# Patient Record
Sex: Female | Born: 1965 | Race: White | Hispanic: Yes | Marital: Single | Smoking: Former smoker
Health system: Southern US, Community
[De-identification: ages and names within clinical notes are randomized; demographics above are authoritative.]

## PROBLEM LIST (undated history)

## (undated) DIAGNOSIS — I7121 Aneurysm of the ascending aorta, without rupture: Secondary | ICD-10-CM

## (undated) DIAGNOSIS — M1990 Unspecified osteoarthritis, unspecified site: Secondary | ICD-10-CM

## (undated) DIAGNOSIS — R0609 Other forms of dyspnea: Secondary | ICD-10-CM

## (undated) DIAGNOSIS — G909 Disorder of the autonomic nervous system, unspecified: Secondary | ICD-10-CM

## (undated) DIAGNOSIS — R0600 Dyspnea, unspecified: Secondary | ICD-10-CM

## (undated) DIAGNOSIS — K319 Disease of stomach and duodenum, unspecified: Secondary | ICD-10-CM

## (undated) DIAGNOSIS — R7303 Prediabetes: Secondary | ICD-10-CM

## (undated) DIAGNOSIS — I712 Thoracic aortic aneurysm, without rupture: Secondary | ICD-10-CM

## (undated) DIAGNOSIS — E042 Nontoxic multinodular goiter: Secondary | ICD-10-CM

## (undated) DIAGNOSIS — D179 Benign lipomatous neoplasm, unspecified: Secondary | ICD-10-CM

## (undated) DIAGNOSIS — Z8679 Personal history of other diseases of the circulatory system: Secondary | ICD-10-CM

## (undated) DIAGNOSIS — J45909 Unspecified asthma, uncomplicated: Secondary | ICD-10-CM

## (undated) DIAGNOSIS — F429 Obsessive-compulsive disorder, unspecified: Secondary | ICD-10-CM

## (undated) DIAGNOSIS — J45990 Exercise induced bronchospasm: Secondary | ICD-10-CM

## (undated) DIAGNOSIS — E079 Disorder of thyroid, unspecified: Secondary | ICD-10-CM

## (undated) DIAGNOSIS — K2100 Gastro-esophageal reflux disease with esophagitis, without bleeding: Secondary | ICD-10-CM

## (undated) DIAGNOSIS — N840 Polyp of corpus uteri: Secondary | ICD-10-CM

## (undated) DIAGNOSIS — I714 Abdominal aortic aneurysm, without rupture: Secondary | ICD-10-CM

## (undated) DIAGNOSIS — I10 Essential (primary) hypertension: Secondary | ICD-10-CM

## (undated) DIAGNOSIS — E119 Type 2 diabetes mellitus without complications: Secondary | ICD-10-CM

## (undated) HISTORY — DX: Unspecified osteoarthritis, unspecified site: M19.90

## (undated) HISTORY — DX: Disease of stomach and duodenum, unspecified: K31.9

## (undated) HISTORY — DX: Abdominal aortic aneurysm, without rupture: I71.4

## (undated) HISTORY — DX: Thoracic aortic aneurysm, without rupture: I71.2

## (undated) HISTORY — DX: Obsessive-compulsive disorder, unspecified: F42.9

## (undated) HISTORY — DX: Aneurysm of the ascending aorta, without rupture: I71.21

## (undated) HISTORY — DX: Essential (primary) hypertension: I10

## (undated) HISTORY — DX: Benign lipomatous neoplasm, unspecified: D17.9

## (undated) HISTORY — DX: Disorder of thyroid, unspecified: E07.9

## (undated) HISTORY — DX: Unspecified asthma, uncomplicated: J45.909

## (undated) HISTORY — DX: Nontoxic multinodular goiter: E04.2

## (undated) HISTORY — DX: Type 2 diabetes mellitus without complications: E11.9

## (undated) HISTORY — DX: Gastro-esophageal reflux disease with esophagitis, without bleeding: K21.00

## (undated) HISTORY — DX: Dyspnea, unspecified: R06.00

## (undated) HISTORY — DX: Other forms of dyspnea: R06.09

---

## 1995-04-18 HISTORY — PX: COSMETIC SURGERY: SHX468

## 1996-04-17 HISTORY — PX: BREAST REDUCTION SURGERY: SHX8

## 1996-12-01 HISTORY — PX: REDUCTION MAMMAPLASTY: SUR839

## 2005-04-17 HISTORY — PX: OTHER SURGICAL HISTORY: SHX169

## 2013-04-17 DIAGNOSIS — T7840XA Allergy, unspecified, initial encounter: Secondary | ICD-10-CM

## 2013-04-17 DIAGNOSIS — J302 Other seasonal allergic rhinitis: Secondary | ICD-10-CM

## 2013-04-17 HISTORY — DX: Other seasonal allergic rhinitis: J30.2

## 2013-04-17 HISTORY — DX: Allergy, unspecified, initial encounter: T78.40XA

## 2014-03-22 ENCOUNTER — Ambulatory Visit: Payer: Self-pay | Admitting: Internal Medicine

## 2014-03-22 LAB — RAPID STREP-A WITH REFLX: Micro Text Report: NEGATIVE

## 2014-03-25 LAB — BETA STREP CULTURE(ARMC)

## 2015-08-18 ENCOUNTER — Encounter: Payer: Self-pay | Admitting: Physician Assistant

## 2015-08-18 ENCOUNTER — Ambulatory Visit: Payer: Self-pay | Admitting: Physician Assistant

## 2015-08-18 VITALS — BP 110/80 | HR 72 | Temp 98.7°F

## 2015-08-18 DIAGNOSIS — J018 Other acute sinusitis: Secondary | ICD-10-CM

## 2015-08-18 MED ORDER — CLARITHROMYCIN ER 500 MG PO TB24: 1000.0000 mg | ORAL_TABLET | Freq: Every day | ORAL | Status: DC

## 2015-08-18 NOTE — Progress Notes (Signed)
S: C/o sore throat and sinus drainage for 3 days, no fever, chills, cp/sob, v/d; mucus is green and thick, cough is sporadic, c/o of facial and dental pain.   Using otc meds: cold and sinus med  O: PE: vitals wnl, nad, perrl eomi, normocephalic, tms dull, nasal mucosa red and swollen, throat injected, neck supple no lymph, lungs c t a, cv rrr, neuro intact  A:  Acute sinusitis   P: biaxin xl 500mg  2 po qd x 10d; drink fluids, continue regular meds , use otc meds of choice, return if not improving in 5 days, return earlier if worsening

## 2015-10-14 ENCOUNTER — Ambulatory Visit (INDEPENDENT_AMBULATORY_CARE_PROVIDER_SITE_OTHER): Payer: 59 | Admitting: Family Medicine

## 2015-10-14 ENCOUNTER — Encounter: Payer: Self-pay | Admitting: Family Medicine

## 2015-10-14 VITALS — BP 120/88 | HR 84 | Ht 63.0 in | Wt 189.0 lb

## 2015-10-14 DIAGNOSIS — E119 Type 2 diabetes mellitus without complications: Secondary | ICD-10-CM | POA: Diagnosis not present

## 2015-10-14 DIAGNOSIS — E079 Disorder of thyroid, unspecified: Secondary | ICD-10-CM | POA: Diagnosis not present

## 2015-10-14 DIAGNOSIS — Z7689 Persons encountering health services in other specified circumstances: Secondary | ICD-10-CM

## 2015-10-14 DIAGNOSIS — Z7189 Other specified counseling: Secondary | ICD-10-CM | POA: Diagnosis not present

## 2015-10-14 NOTE — Progress Notes (Signed)
Name: Brittany Ochoa   MRN: [id number]    DOB: 1965/05/29   Date:10/14/2015       Progress Note  Subjective  Chief Complaint  Chief Complaint  Patient presents with  . Establish Care  . Diabetes    has been "pre-diabetic in past, not ever been on meds"    HPI Comments: Patient to establish care new physician.  Diabetes She presents for her follow-up diabetic visit. Diabetes type: prediabetic. Onset time: 1. Her disease course has been fluctuating. There are no hypoglycemic associated symptoms. Pertinent negatives for hypoglycemia include no dizziness, headaches or nervousness/anxiousness. Associated symptoms include blurred vision, fatigue, foot paresthesias, polydipsia, polyphagia, polyuria and visual change. Pertinent negatives for diabetes include no chest pain, no foot ulcerations, no weakness and no weight loss. ([place]uria) There are no hypoglycemic complications. Symptoms are worsening. There are no diabetic complications. Pertinent negatives for diabetic complications include no autonomic neuropathy, CVA, heart disease, impotence, nephropathy, peripheral neuropathy, PVD or retinopathy. Risk factors for coronary artery disease include obesity. Current diabetic treatment includes diet. She is compliant with treatment all of the time. Her breakfast blood glucose is taken between 8-9 am. Her breakfast blood glucose range is generally 110-130 mg/dl. She does not see a podiatrist.Eye exam is not current.  Thyroid Problem Presents for initial visit. Symptoms include cold intolerance, constipation, depressed mood, dry skin, fatigue, hair loss, hoarse voice, palpitations, visual change and weight gain. Patient reports no anxiety, diarrhea or weight loss. The symptoms have been stable. Treatments tried: cobalt exposure. (Needle bx/aspiration)    No problem-specific assessment & plan notes found for this encounter.   Past Medical History  Diagnosis Date  . Thyroid disease   . Diabetes  mellitus without complication [facility])     Past Surgical History  Procedure Laterality Date  . Breast reduction surgery  1998  . Lipoma removal  2007    back    Family History  Problem Relation Age of Onset  . Diabetes Mother   . Diabetes Father   . Heart disease Father   . Cancer Paternal Grandmother   . Heart disease Paternal Grandmother   . Cancer Paternal Grandfather   . Heart disease Paternal Grandfather     Social History   Social History  . Marital Status: Single    Spouse Name: N/A  . Number of Children: N/A  . Years of Education: N/A   Occupational History  . Not on file.   Social History Main Topics  . Smoking status: Former Smoker    Quit date: 04/17/2004  . Smokeless tobacco: Not on file  . Alcohol Use: 0.0 oz/week    0 Standard drinks or equivalent per week     Comment: social  . Drug Use: No     Comment: past only  . Sexual Activity: Not Currently   Other Topics Concern  . Not on file   Social History Narrative    Allergies  Allergen Reactions  . Codeine      Review of Systems  Constitutional: Positive for weight gain and fatigue. Negative for fever, chills, weight loss and malaise/fatigue.  HENT: Positive for hoarse voice. Negative for ear discharge, ear pain and sore throat.   Eyes: Positive for blurred vision.  Respiratory: Negative for cough, sputum production, shortness of breath and wheezing.   Cardiovascular: Positive for palpitations. Negative for chest pain and leg swelling.  Gastrointestinal: Positive for constipation. Negative for heartburn, nausea, abdominal pain, diarrhea, blood in stool and melena.  Genitourinary:  Negative for dysuria, urgency, frequency, hematuria and impotence.  Musculoskeletal: Negative for myalgias, back pain, joint pain and neck pain.  Skin: Negative for rash.  Neurological: Negative for dizziness, tingling, sensory change, focal weakness, weakness and headaches.  Endo/Heme/Allergies: Positive for cold  intolerance, polydipsia and polyphagia. Negative for environmental allergies. Does not bruise/bleed easily.  Psychiatric/Behavioral: Negative for depression and suicidal ideas. The patient is not nervous/anxious and does not have insomnia.      Objective  Filed Vitals:   10/14/15 1417  BP: 120/88  Pulse: 84  Height: 5\' 3"  (1.6 m)  Weight: 189 lb (85.73 kg)    Physical Exam  Constitutional: She is well-developed, well-nourished, and in no distress. No distress.  HENT:  Head: Normocephalic and atraumatic.  Right Ear: External ear normal.  Left Ear: External ear normal.  Nose: Nose normal.  Mouth/Throat: Oropharynx is clear and moist.  Eyes: Conjunctivae and EOM are normal. Pupils are equal, round, and reactive to light. Right eye exhibits no discharge. Left eye exhibits no discharge.  Neck: Normal range of motion. Neck supple. No JVD present. No thyromegaly present.  Cardiovascular: Normal rate, regular rhythm, normal heart sounds and intact distal pulses.  Exam reveals no gallop and no friction rub.   No murmur heard. Pulmonary/Chest: Effort normal and breath sounds normal.  Abdominal: Soft. Bowel sounds are normal. She exhibits no mass. There is no tenderness. There is no guarding.  Musculoskeletal: Normal range of motion. She exhibits no edema.  Lymphadenopathy:    She has no cervical adenopathy.  Neurological: She is alert.  Skin: Skin is warm and dry. She is not diaphoretic.  Psychiatric: Mood and affect normal.  Nursing note and vitals reviewed.     Assessment & Plan  Problem List Items Addressed This Visit    None    Visit Diagnoses    Encounter to establish care with new doctor    -  Primary    Type 2 diabetes mellitus without complication, without long-term current use of insulin ([place])        Relevant Orders    Hemoglobin A1c    Renal Function Panel    Ambulatory referral to Endocrinology    Thyroid disease        Relevant Orders    Thyroid Panel With TSH     Ambulatory referral to Endocrinology      Pt specifically wants to see Dr Gabriel Carina   Dr. [facility]  10/14/2015

## 2015-10-15 LAB — THYROID PANEL WITH TSH
Free Thyroxine Index: 1.8 (ref 1.2–4.9)
T3 Uptake Ratio: 21 % — ABNORMAL LOW (ref 24–39)
T4, Total: 8.7 ug/dL (ref 4.5–12.0)
TSH: 0.961 u[IU]/mL (ref 0.450–4.500)

## 2015-10-15 LAB — RENAL FUNCTION PANEL
Albumin: 4.2 g/dL (ref 3.5–5.5)
BUN/Creatinine Ratio: 17 (ref 9–23)
BUN: 14 mg/dL (ref 6–24)
CO2: 27 mmol/L (ref 18–29)
Calcium: 9.2 mg/dL (ref 8.7–10.2)
Chloride: 100 mmol/L (ref 96–106)
Creatinine, Ser: 0.81 mg/dL (ref 0.57–1.00)
GFR calc Af Amer: 99 mL/min/{1.73_m2} (ref >59)
GFR calc non Af Amer: 86 mL/min/{1.73_m2} (ref >59)
Glucose: 82 mg/dL (ref 65–99)
Phosphorus: 3.8 mg/dL (ref 2.5–4.5)
Potassium: 4.6 mmol/L (ref 3.5–5.2)
Sodium: 140 mmol/L (ref 134–144)

## 2015-10-15 LAB — HEMOGLOBIN A1C
Est. average glucose Bld gHb Est-mCnc: 117 mg/dL
Hgb A1c MFr Bld: 5.7 % — ABNORMAL HIGH (ref 4.8–5.6)

## 2015-10-15 MED ORDER — METFORMIN HCL 500 MG PO TABS: 500.0000 mg | ORAL_TABLET | Freq: Every day | ORAL | Status: DC

## 2015-10-15 NOTE — Addendum Note (Signed)
Addended by: Fredderick Severance on: 10/15/2015 02:46 PM   Modules accepted: Orders

## 2015-11-01 ENCOUNTER — Encounter: Payer: Self-pay | Admitting: Physician Assistant

## 2015-11-01 ENCOUNTER — Ambulatory Visit: Payer: Self-pay | Admitting: Physician Assistant

## 2015-11-01 VITALS — BP 120/80 | HR 76 | Temp 98.2°F

## 2015-11-01 DIAGNOSIS — J019 Acute sinusitis, unspecified: Secondary | ICD-10-CM

## 2015-11-01 MED ORDER — CLARITHROMYCIN ER 500 MG PO TB24: 1000.0000 mg | ORAL_TABLET | Freq: Every day | ORAL | Status: DC

## 2015-11-01 NOTE — Progress Notes (Signed)
S: headache, s/t, chest congestion and dry cough x 4 days. Unaware of fever.  Hx of sinusitis.   O: TMS dull bilat, nose boggy, throat with moderate post drainage. Neck supple without aden.  Lungs clear bilat.  Heart RRR A: sinusitis, ? Ethmoid  P: Biaxin XL 500mg  2 qd x 10 days

## 2015-11-22 DIAGNOSIS — E042 Nontoxic multinodular goiter: Secondary | ICD-10-CM | POA: Diagnosis not present

## 2015-11-22 DIAGNOSIS — R946 Abnormal results of thyroid function studies: Secondary | ICD-10-CM | POA: Diagnosis not present

## 2015-11-22 DIAGNOSIS — R7303 Prediabetes: Secondary | ICD-10-CM | POA: Diagnosis not present

## 2015-11-26 ENCOUNTER — Ambulatory Visit: Payer: Self-pay | Admitting: Physician Assistant

## 2015-11-26 ENCOUNTER — Encounter: Payer: Self-pay | Admitting: Physician Assistant

## 2015-11-26 VITALS — BP 122/80 | HR 78 | Temp 98.4°F

## 2015-11-26 DIAGNOSIS — R300 Dysuria: Secondary | ICD-10-CM

## 2015-11-26 LAB — POCT URINALYSIS DIPSTICK
Bilirubin, UA: NEGATIVE
Blood, UA: NEGATIVE
Ketones, UA: NEGATIVE
Leukocytes, UA: NEGATIVE
Nitrite, UA: POSITIVE
Spec Grav, UA: <=1.005
Urobilinogen, UA: 1
pH, UA: 5

## 2015-11-26 MED ORDER — SULFAMETHOXAZOLE-TRIMETHOPRIM 800-160 MG PO TABS: 1.0000 | ORAL_TABLET | Freq: Two times a day (BID) | ORAL | 0 refills | Status: DC

## 2015-11-26 MED ORDER — PHENAZOPYRIDINE HCL 200 MG PO TABS: 200.0000 mg | ORAL_TABLET | Freq: Three times a day (TID) | ORAL | 0 refills | Status: DC | PRN

## 2015-11-26 NOTE — Progress Notes (Signed)
   Subjective:    Patient ID: Brittany Ochoa, female    DOB: 09-25-1965, 50 y.o.   MRN: [id number]  HPI Patient c/o urinary urgency, frequency, and dysuriafor 2 days.  Denies flank pain, fever, or vaginal discharge. Patient using OTC Azo.    Review of Systems    DM Objective:   Physical Exam Dip UA positive for Nitrates and Leuc.       Assessment & Plan:Uti  Bactrim DS and Pyridium.  Follow up PRN.

## 2015-11-30 ENCOUNTER — Encounter: Payer: Self-pay | Admitting: Physician Assistant

## 2015-11-30 ENCOUNTER — Ambulatory Visit: Payer: Self-pay | Admitting: Physician Assistant

## 2015-11-30 VITALS — BP 130/80 | HR 84 | Temp 98.2°F

## 2015-11-30 DIAGNOSIS — J3089 Other allergic rhinitis: Secondary | ICD-10-CM

## 2015-11-30 MED ORDER — FLUTICASONE PROPIONATE 50 MCG/ACT NA SUSP: 2.0000 | Freq: Every day | NASAL | 6 refills | Status: DC

## 2015-11-30 NOTE — Progress Notes (Signed)
S: C/o runny nose and congestion for 3 days, no fever, chills, cp/sob, v/d; mucus is mostly clear, was a little yellow yesterday, cough is sporadic, c/o of facial and dental pressure, is on bactrim for a uti, has 7 days of medication left.   Using otc meds:   O: PE: vitals wnl, nad, perrl eomi, normocephalic, tms dull, nasal mucosa pink and swollen, throat injected, neck supple no lymph, lungs c t a, cv rrr, neuro intact  A:  Acute allergic rhinitis   P: drink fluids, continue regular meds , use otc meds of choice, return if not improving in 5 days, return earlier if worsening , flonase, claritin, continue bactrim

## 2016-01-07 DIAGNOSIS — H524 Presbyopia: Secondary | ICD-10-CM | POA: Diagnosis not present

## 2016-04-06 ENCOUNTER — Encounter: Payer: Self-pay | Admitting: Physician Assistant

## 2016-04-06 ENCOUNTER — Ambulatory Visit: Payer: Self-pay | Admitting: Physician Assistant

## 2016-04-06 VITALS — BP 122/78 | HR 80 | Temp 98.5°F

## 2016-04-06 DIAGNOSIS — J209 Acute bronchitis, unspecified: Secondary | ICD-10-CM

## 2016-04-06 MED ORDER — ALBUTEROL SULFATE HFA 108 (90 BASE) MCG/ACT IN AERS: 2.0000 | INHALATION_SPRAY | Freq: Four times a day (QID) | RESPIRATORY_TRACT | 0 refills | Status: DC | PRN

## 2016-04-06 MED ORDER — CLARITHROMYCIN ER 500 MG PO TB24: 1000.0000 mg | ORAL_TABLET | Freq: Every day | ORAL | 0 refills | Status: DC

## 2016-04-06 NOTE — Progress Notes (Signed)
S: C/o cough and congestion with  chest tightness for 1.5 weeks, chest is sore from coughing, denies fever, chills; +cough is dry and hacking; keeping pt awake at night;  denies cardiac type chest pain or sob, v/d, abd pain Remainder ros neg  O: vitals wnl, nad, tms clear, throat injected, neck supple no lymph, lungs with c t a, cv rrr, neuro intact, cough is dry and barking  A:  Acute bronchitis   P:  rx medication: biaxin, albuterol inhaler;  use otc meds, tylenol or motrin as needed for fever/chills, return if not better in 3 -5 days, return earlier if worsening

## 2016-04-12 ENCOUNTER — Ambulatory Visit
Admission: RE | Admit: 2016-04-12 | Discharge: 2016-04-12 | Disposition: A | Discharge status: Home / Self Care | Payer: 59 | Source: Ambulatory Visit | Attending: Family Medicine | Admitting: Family Medicine

## 2016-04-12 ENCOUNTER — Ambulatory Visit (INDEPENDENT_AMBULATORY_CARE_PROVIDER_SITE_OTHER): Payer: 59 | Admitting: Family Medicine

## 2016-04-12 ENCOUNTER — Encounter: Payer: Self-pay | Admitting: Family Medicine

## 2016-04-12 ENCOUNTER — Ambulatory Visit: Payer: Self-pay | Admitting: Physician Assistant

## 2016-04-12 VITALS — BP 100/70 | HR 92 | Temp 98.4°F | Ht 63.0 in | Wt 188.0 lb

## 2016-04-12 DIAGNOSIS — S29011A Strain of muscle and tendon of front wall of thorax, initial encounter: Secondary | ICD-10-CM

## 2016-04-12 DIAGNOSIS — J4521 Mild intermittent asthma with (acute) exacerbation: Secondary | ICD-10-CM | POA: Insufficient documentation

## 2016-04-12 DIAGNOSIS — J40 Bronchitis, not specified as acute or chronic: Secondary | ICD-10-CM

## 2016-04-12 DIAGNOSIS — R05 Cough: Secondary | ICD-10-CM | POA: Diagnosis not present

## 2016-04-12 DIAGNOSIS — M940 Chondrocostal junction syndrome [Tietze]: Secondary | ICD-10-CM | POA: Diagnosis not present

## 2016-04-12 MED ORDER — DOXYCYCLINE HYCLATE 100 MG PO TABS: 100.0000 mg | ORAL_TABLET | Freq: Two times a day (BID) | ORAL | 0 refills | Status: DC

## 2016-04-12 NOTE — Progress Notes (Signed)
Name: Brittany Ochoa   MRN: [id number]    DOB: 1965/05/25   Date:04/12/2016       Progress Note  Subjective  Chief Complaint  Chief Complaint  Patient presents with  . Follow-up    seen by Ashok Cordia- was given Biaxin and Fluticasone on 21st- does not feel any better- "I feel like I have walking pneumonia"- can't breathe well, give out of breath easy. Cough with production off and on- other times just a dry cough.     Cough  This is a new problem. The current episode started in the past 7 days. The problem has been unchanged. The problem occurs every few minutes. The cough is productive of purulent sputum. Associated symptoms include chest pain, chills, headaches, nasal congestion, postnasal drip, shortness of breath, sweats and wheezing. Pertinent negatives include no ear congestion, ear pain, fever, heartburn, hemoptysis, myalgias, rash, rhinorrhea, sore throat or weight loss. The symptoms are aggravated by cold air. She has tried a beta-agonist inhaler and OTC inhaler for the symptoms. The treatment provided mild relief. There is no history of environmental allergies or pneumonia.  Shortness of Breath  This is a new problem. The current episode started in the past 7 days. The problem occurs intermittently. The problem has been waxing and waning. Associated symptoms include chest pain, headaches and wheezing. Pertinent negatives include no abdominal pain, ear pain, fever, hemoptysis, leg pain, leg swelling, neck pain, orthopnea, PND, rash, rhinorrhea, sore throat or sputum production. The patient has no known risk factors for DVT/PE. She has tried beta agonist inhalers for the symptoms. The treatment provided no relief. There is no history of allergies, chronic lung disease, a heart failure or pneumonia.    No problem-specific Assessment & Plan notes found for this encounter.   Past Medical History:  Diagnosis Date  . Diabetes mellitus without complication ([place])   . Thyroid disease      Past Surgical History:  Procedure Laterality Date  . BREAST REDUCTION SURGERY  1998  . lipoma removal  2007   back    Family History  Problem Relation Age of Onset  . Diabetes Mother   . Diabetes Father   . Heart disease Father   . Cancer Paternal Grandmother   . Heart disease Paternal Grandmother   . Cancer Paternal Grandfather   . Heart disease Paternal Grandfather     Social History   Social History  . Marital status: Single    Spouse name: N/A  . Number of children: N/A  . Years of education: N/A   Occupational History  . Not on file.   Social History Main Topics  . Smoking status: Former Smoker    Quit date: 04/17/2004  . Smokeless tobacco: Not on file  . Alcohol use 0.0 oz/week     Comment: social  . Drug use: No     Comment: past only  . Sexual activity: Not Currently   Other Topics Concern  . Not on file   Social History Narrative  . No narrative on file    Allergies  Allergen Reactions  . Codeine      Review of Systems  Constitutional: Positive for chills. Negative for fever, malaise/fatigue and weight loss.  HENT: Positive for postnasal drip. Negative for ear discharge, ear pain, rhinorrhea and sore throat.   Eyes: Negative for blurred vision.  Respiratory: Positive for cough, shortness of breath and wheezing. Negative for hemoptysis and sputum production.   Cardiovascular: Positive for chest pain. Negative  for palpitations, orthopnea, leg swelling and PND.  Gastrointestinal: Negative for abdominal pain, blood in stool, constipation, diarrhea, heartburn, melena and nausea.  Genitourinary: Negative for dysuria, frequency, hematuria and urgency.  Musculoskeletal: Negative for back pain, joint pain, myalgias and neck pain.  Skin: Negative for rash.  Neurological: Positive for headaches. Negative for dizziness, tingling, sensory change and focal weakness.  Endo/Heme/Allergies: Negative for environmental allergies and polydipsia. Does not  bruise/bleed easily.  Psychiatric/Behavioral: Negative for depression and suicidal ideas. The patient is not nervous/anxious and does not have insomnia.      Objective  Vitals:   04/12/16 1131  BP: 100/70  Pulse: 92  Temp: 98.4 F (36.9 C)  TempSrc: Oral  SpO2: 99%  Weight: 188 lb (85.3 kg)  Height: 5\' 3"  (1.6 m)    Physical Exam  Constitutional: She is well-developed, well-nourished, and in no distress.  Cardiovascular: Normal rate and normal heart sounds.   Pulmonary/Chest: No respiratory distress. She has wheezes. She exhibits no tenderness.  Musculoskeletal: Normal range of motion.  Skin: Skin is warm.  Psychiatric: Affect normal.  Nursing note and vitals reviewed.     Assessment & Plan  Problem List Items Addressed This Visit    None    Visit Diagnoses    Bronchitis    -  Primary   Relevant Medications   doxycycline (VIBRA-TABS) 100 MG tablet   Other Relevant Orders   DG Chest 2 View   Mild intermittent reactive airway disease with acute exacerbation       Relevant Medications   doxycycline (VIBRA-TABS) 100 MG tablet   Other Relevant Orders   DG Chest 2 View   Intercostal muscle strain, initial encounter       Costochondritis            Dr. [facility]  04/12/16

## 2016-04-25 ENCOUNTER — Encounter: Payer: Self-pay | Admitting: Physician Assistant

## 2016-04-25 ENCOUNTER — Ambulatory Visit: Payer: Self-pay | Admitting: Physician Assistant

## 2016-04-25 VITALS — BP 136/86 | HR 80 | Temp 98.6°F

## 2016-04-25 DIAGNOSIS — J069 Acute upper respiratory infection, unspecified: Secondary | ICD-10-CM

## 2016-04-25 NOTE — Progress Notes (Signed)
S: states she finished the biaxin, saw another provider and they gave her doxy but didn't take it, still coughing up yellow mucus, some sob when running, no fever/chills/body aches  O: vitals wnl, nad, tms clear, nasal mucosa pale pink, throat wnl, neck supple no lymph, lungs c t a, cv rrr,   A: acute uri  P: take doxy, recommended steroid pack but pt doesn't want to take it, f/u with pcp

## 2016-05-01 ENCOUNTER — Encounter: Payer: Self-pay | Admitting: Obstetrics and Gynecology

## 2016-07-07 DIAGNOSIS — L728 Other follicular cysts of the skin and subcutaneous tissue: Secondary | ICD-10-CM | POA: Diagnosis not present

## 2016-07-07 DIAGNOSIS — D171 Benign lipomatous neoplasm of skin and subcutaneous tissue of trunk: Secondary | ICD-10-CM | POA: Diagnosis not present

## 2016-09-04 DIAGNOSIS — L570 Actinic keratosis: Secondary | ICD-10-CM | POA: Diagnosis not present

## 2016-09-04 DIAGNOSIS — D485 Neoplasm of uncertain behavior of skin: Secondary | ICD-10-CM | POA: Diagnosis not present

## 2016-09-04 DIAGNOSIS — D171 Benign lipomatous neoplasm of skin and subcutaneous tissue of trunk: Secondary | ICD-10-CM | POA: Diagnosis not present

## 2016-09-04 DIAGNOSIS — L720 Epidermal cyst: Secondary | ICD-10-CM | POA: Diagnosis not present

## 2016-09-04 DIAGNOSIS — D225 Melanocytic nevi of trunk: Secondary | ICD-10-CM | POA: Diagnosis not present

## 2016-09-28 DIAGNOSIS — I340 Nonrheumatic mitral (valve) insufficiency: Secondary | ICD-10-CM | POA: Diagnosis not present

## 2016-09-28 DIAGNOSIS — E348 Other specified endocrine disorders: Secondary | ICD-10-CM | POA: Insufficient documentation

## 2016-09-28 DIAGNOSIS — E042 Nontoxic multinodular goiter: Secondary | ICD-10-CM | POA: Diagnosis not present

## 2016-09-28 DIAGNOSIS — Z1231 Encounter for screening mammogram for malignant neoplasm of breast: Secondary | ICD-10-CM | POA: Diagnosis not present

## 2016-09-28 DIAGNOSIS — D171 Benign lipomatous neoplasm of skin and subcutaneous tissue of trunk: Secondary | ICD-10-CM | POA: Insufficient documentation

## 2016-09-28 DIAGNOSIS — N924 Excessive bleeding in the premenopausal period: Secondary | ICD-10-CM | POA: Diagnosis not present

## 2016-10-05 DIAGNOSIS — D171 Benign lipomatous neoplasm of skin and subcutaneous tissue of trunk: Secondary | ICD-10-CM | POA: Diagnosis not present

## 2016-10-05 DIAGNOSIS — Z923 Personal history of irradiation: Secondary | ICD-10-CM | POA: Diagnosis not present

## 2016-10-05 DIAGNOSIS — N920 Excessive and frequent menstruation with regular cycle: Secondary | ICD-10-CM | POA: Diagnosis not present

## 2016-10-05 DIAGNOSIS — Z124 Encounter for screening for malignant neoplasm of cervix: Secondary | ICD-10-CM | POA: Diagnosis not present

## 2016-10-05 DIAGNOSIS — E042 Nontoxic multinodular goiter: Secondary | ICD-10-CM | POA: Diagnosis not present

## 2016-10-05 DIAGNOSIS — R635 Abnormal weight gain: Secondary | ICD-10-CM | POA: Diagnosis not present

## 2016-10-05 DIAGNOSIS — Z113 Encounter for screening for infections with a predominantly sexual mode of transmission: Secondary | ICD-10-CM | POA: Diagnosis not present

## 2016-10-05 DIAGNOSIS — Z833 Family history of diabetes mellitus: Secondary | ICD-10-CM | POA: Diagnosis not present

## 2016-10-05 LAB — HM PAP SMEAR: HM Pap smear: NEGATIVE

## 2016-10-12 ENCOUNTER — Other Ambulatory Visit
Admission: RE | Admit: 2016-10-12 | Discharge: 2016-10-12 | Disposition: A | Discharge status: Home / Self Care | Payer: 59 | Source: Ambulatory Visit | Attending: Endocrinology | Admitting: Endocrinology

## 2016-10-12 DIAGNOSIS — E042 Nontoxic multinodular goiter: Secondary | ICD-10-CM | POA: Insufficient documentation

## 2016-10-12 DIAGNOSIS — Z833 Family history of diabetes mellitus: Secondary | ICD-10-CM | POA: Diagnosis not present

## 2016-10-12 DIAGNOSIS — N920 Excessive and frequent menstruation with regular cycle: Secondary | ICD-10-CM | POA: Diagnosis not present

## 2016-10-12 DIAGNOSIS — R635 Abnormal weight gain: Secondary | ICD-10-CM | POA: Diagnosis not present

## 2016-10-12 DIAGNOSIS — Z923 Personal history of irradiation: Secondary | ICD-10-CM | POA: Diagnosis not present

## 2016-10-12 LAB — CORTISOL: Cortisol, Plasma: 0.8 ug/dL

## 2016-10-19 DIAGNOSIS — D259 Leiomyoma of uterus, unspecified: Secondary | ICD-10-CM | POA: Diagnosis not present

## 2016-10-19 DIAGNOSIS — N920 Excessive and frequent menstruation with regular cycle: Secondary | ICD-10-CM | POA: Diagnosis not present

## 2016-10-19 LAB — MISC LABCORP TEST (SEND OUT): Labcorp test code: 500118

## 2016-11-20 DIAGNOSIS — R232 Flushing: Secondary | ICD-10-CM | POA: Diagnosis not present

## 2016-11-20 DIAGNOSIS — N920 Excessive and frequent menstruation with regular cycle: Secondary | ICD-10-CM | POA: Diagnosis not present

## 2017-01-17 ENCOUNTER — Ambulatory Visit: Payer: Self-pay | Admitting: Physician Assistant

## 2017-01-17 ENCOUNTER — Encounter: Payer: Self-pay | Admitting: Physician Assistant

## 2017-01-17 VITALS — BP 132/90 | HR 88 | Temp 98.3°F

## 2017-01-17 DIAGNOSIS — J209 Acute bronchitis, unspecified: Secondary | ICD-10-CM

## 2017-01-17 MED ORDER — PSEUDOEPH-BROMPHEN-DM 30-2-10 MG/5ML PO SYRP: 5.0000 mL | ORAL_SOLUTION | Freq: Four times a day (QID) | ORAL | 0 refills | Status: DC | PRN

## 2017-01-17 MED ORDER — CLARITHROMYCIN 500 MG PO TABS: 500.0000 mg | ORAL_TABLET | Freq: Two times a day (BID) | ORAL | 0 refills | Status: DC

## 2017-01-17 NOTE — Progress Notes (Signed)
   Subjective:Productive cough    Patient ID: Brittany Ochoa, female    DOB: 01-09-1966, 51 y.o.   MRN: [id number]  HPI Patient c/o one week of productive cough, mild dyspnea, and wheezing. Denies nasal congestion or running nose. No palliative measure for compliant.   Review of Systems    Seasonal rhinitis Objective:   Physical Exam HEENT unremarkable. Neck supple w/o adenopathy. Lungs with bilateral rhonchi. Heart RRR       Assessment & Plan:Bronchitis  Biaxin and Bromfed DM as directed. Follow up with PCP.

## 2017-02-19 DIAGNOSIS — H524 Presbyopia: Secondary | ICD-10-CM | POA: Diagnosis not present

## 2017-02-21 ENCOUNTER — Encounter: Payer: Self-pay | Admitting: Physician Assistant

## 2017-02-21 ENCOUNTER — Ambulatory Visit: Payer: Self-pay | Admitting: Physician Assistant

## 2017-02-21 VITALS — BP 138/90 | HR 93 | Temp 98.7°F

## 2017-02-21 DIAGNOSIS — J209 Acute bronchitis, unspecified: Secondary | ICD-10-CM

## 2017-02-21 MED ORDER — CEFDINIR 300 MG PO CAPS: 300.0000 mg | ORAL_CAPSULE | Freq: Two times a day (BID) | ORAL | 0 refills | Status: DC

## 2017-02-21 MED ORDER — METHYLPREDNISOLONE 4 MG PO TBPK: ORAL_TABLET | ORAL | 0 refills | Status: DC

## 2017-02-21 NOTE — Progress Notes (Signed)
S: Patient complains of continued bronchitis. States she feels a "whoosh" sensation in her lungs. She is getting a little short of breath going up steps and walking long distances. States she had the same symptoms years ago and it was pneumonia. Denies fever or chills ,no chest pain; did not finish her Biaxin as she was traveling thinks she took about 7 days worth  O: Vitals are normal, ENT is normal, neck is supple, no lymphadenopathy noted, lungs are clear to auscultation, cv rrr  A: Bronchitis  P: Medrol Dosepak, Omnicef 300 mg twice a day for 10 days. Patient was instructed to return to the clinic if worsening. She is to go the emergency department if she feels short of breath

## 2017-04-17 DIAGNOSIS — R0609 Other forms of dyspnea: Secondary | ICD-10-CM

## 2017-04-17 HISTORY — DX: Other forms of dyspnea: R06.09

## 2017-04-18 ENCOUNTER — Ambulatory Visit (INDEPENDENT_AMBULATORY_CARE_PROVIDER_SITE_OTHER): Payer: Self-pay | Admitting: Nurse Practitioner

## 2017-04-18 ENCOUNTER — Encounter: Payer: Self-pay | Admitting: Nurse Practitioner

## 2017-04-18 VITALS — BP 129/96 | HR 84 | Temp 98.4°F | Wt 182.0 lb

## 2017-04-18 DIAGNOSIS — M2550 Pain in unspecified joint: Secondary | ICD-10-CM

## 2017-04-18 NOTE — Progress Notes (Signed)
   Subjective:    Patient ID: Brittany Ochoa, female    DOB: 02/27/1966, 52 y.o.   MRN: [id number]  Patient presents today with complaints of generalized joint and muscle pain for the past 5-7 days.  Patient states she did have some sinus drainage, but that has since improved and wondered if her symptoms were related.  Patient has been seen for MS, Lupus, but has not gotten a clear diagnosis.  Patient states she has also been fatigued and sleeping more.  Patient denies fever, cough, congestion, abdominal pain, chest pain, nausea, and vomiting.  Patient does admit to being perimenopausal.    Review of Systems  Constitutional: Positive for fatigue.  HENT: Negative.   Eyes: Negative.   Respiratory: Negative.   Cardiovascular: Negative.   Musculoskeletal: Positive for back pain and neck pain.       Bilateral knee pain, elbow pain       Objective:   Physical Exam  Constitutional: She is oriented to person, place, and time. She appears well-developed and well-nourished. No distress.  HENT:  Head: Normocephalic and atraumatic.  Right Ear: External ear normal.  Left Ear: External ear normal.  Eyes: Conjunctivae and EOM are normal. Pupils are equal, round, and reactive to light.  Neck: Normal range of motion. Neck supple. No thyromegaly present.  Cardiovascular: Normal rate, regular rhythm and normal heart sounds.  Pulmonary/Chest: Effort normal and breath sounds normal.  Abdominal: Soft. Bowel sounds are normal. She exhibits no distension. There is no tenderness.  Musculoskeletal: Normal range of motion. She exhibits no edema or deformity.  Neurological: She is alert and oriented to person, place, and time. No cranial nerve deficit.  Skin: Skin is warm and dry. No erythema.  Psychiatric: She has a normal mood and affect. Her behavior is normal. Judgment and thought content normal.          Assessment & Plan:  Joint Pain.  Patient instructed to trial the use of Ibuprofen 800mg   every 8 hours for 3-4 days to see if symptoms improve.  Patient also instructed to f/u with PCP for a physical and labs.  Patient verbalized understanding.

## 2017-04-20 ENCOUNTER — Telehealth: Payer: Self-pay

## 2017-04-25 NOTE — Telephone Encounter (Signed)
Pt returned call and states she is feeling better. 

## 2017-04-29 ENCOUNTER — Other Ambulatory Visit: Payer: Self-pay

## 2017-04-29 ENCOUNTER — Emergency Department
Admission: EM | Admit: 2017-04-29 | Discharge: 2017-04-30 | Disposition: A | Discharge status: Home / Self Care | Payer: 59 | Attending: Emergency Medicine | Admitting: Emergency Medicine

## 2017-04-29 ENCOUNTER — Emergency Department: Payer: 59

## 2017-04-29 ENCOUNTER — Encounter: Payer: Self-pay | Admitting: Emergency Medicine

## 2017-04-29 DIAGNOSIS — R0789 Other chest pain: Secondary | ICD-10-CM | POA: Insufficient documentation

## 2017-04-29 DIAGNOSIS — R002 Palpitations: Secondary | ICD-10-CM | POA: Insufficient documentation

## 2017-04-29 DIAGNOSIS — R200 Anesthesia of skin: Secondary | ICD-10-CM | POA: Diagnosis not present

## 2017-04-29 DIAGNOSIS — Z79899 Other long term (current) drug therapy: Secondary | ICD-10-CM | POA: Diagnosis not present

## 2017-04-29 DIAGNOSIS — I7121 Aneurysm of the ascending aorta, without rupture: Secondary | ICD-10-CM

## 2017-04-29 DIAGNOSIS — R079 Chest pain, unspecified: Secondary | ICD-10-CM | POA: Diagnosis not present

## 2017-04-29 DIAGNOSIS — E119 Type 2 diabetes mellitus without complications: Secondary | ICD-10-CM | POA: Insufficient documentation

## 2017-04-29 DIAGNOSIS — E041 Nontoxic single thyroid nodule: Secondary | ICD-10-CM | POA: Diagnosis not present

## 2017-04-29 DIAGNOSIS — Z87891 Personal history of nicotine dependence: Secondary | ICD-10-CM | POA: Diagnosis not present

## 2017-04-29 DIAGNOSIS — I712 Thoracic aortic aneurysm, without rupture: Secondary | ICD-10-CM | POA: Insufficient documentation

## 2017-04-29 DIAGNOSIS — R51 Headache: Secondary | ICD-10-CM | POA: Diagnosis not present

## 2017-04-29 DIAGNOSIS — I714 Abdominal aortic aneurysm, without rupture: Secondary | ICD-10-CM | POA: Diagnosis not present

## 2017-04-29 LAB — CBC
HCT: 42 % (ref 35.0–47.0)
Hemoglobin: 14.5 g/dL (ref 12.0–16.0)
MCH: 30.2 pg (ref 26.0–34.0)
MCHC: 34.5 g/dL (ref 32.0–36.0)
MCV: 87.6 fL (ref 80.0–100.0)
Platelets: 248 10*3/uL (ref 150–440)
RBC: 4.8 MIL/uL (ref 3.80–5.20)
RDW: 13.2 % (ref 11.5–14.5)
WBC: 8 10*3/uL (ref 3.6–11.0)

## 2017-04-29 LAB — BASIC METABOLIC PANEL
Anion gap: 11 (ref 5–15)
BUN: 16 mg/dL (ref 6–20)
CO2: 26 mmol/L (ref 22–32)
Calcium: 9.4 mg/dL (ref 8.9–10.3)
Chloride: 96 mmol/L — ABNORMAL LOW (ref 101–111)
Creatinine, Ser: 0.66 mg/dL (ref 0.44–1.00)
GFR calc Af Amer: >60 mL/min (ref >60)
GFR calc non Af Amer: >60 mL/min (ref >60)
Glucose, Bld: 107 mg/dL — ABNORMAL HIGH (ref 65–99)
Potassium: 3.6 mmol/L (ref 3.5–5.1)
Sodium: 133 mmol/L — ABNORMAL LOW (ref 135–145)

## 2017-04-29 LAB — TROPONIN I
Troponin I: <0.03 ng/mL (ref <0.03)
Troponin I: <0.03 ng/mL (ref <0.03)

## 2017-04-29 MED ORDER — IOPAMIDOL (ISOVUE-370) INJECTION 76%
125.0000 mL | Freq: Once | INTRAVENOUS | Status: AC | PRN
  Administered 2017-04-29: 125 mL via INTRAVENOUS

## 2017-04-29 NOTE — ED Triage Notes (Signed)
Pt presents to ED via POV c/o L sided sharp, intermittent, chest pain starting today associated with L arm tingling and numbness. Pt reports no pain or numbness on arrival. States "I was panicking because I thought I was having a heart attack. As I calmed down it's started to subside." Also c/o palpitations x2 weeks.

## 2017-04-29 NOTE — Discharge Instructions (Signed)
Your CT scan today does not reveal any acute issues or causes of your symptoms. 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 You should follow up with vascular surgery for further evaluation of this finding. You also have a 7 mm thyroid nodule which should be further evaluated by your primary care doctor.

## 2017-04-29 NOTE — ED Notes (Signed)
ED Provider at bedside. 

## 2017-04-29 NOTE — ED Notes (Signed)
CT notified patient has IV access.

## 2017-04-29 NOTE — ED Notes (Signed)
Unsuccessful IV start x2 by this RN, (LFA, RAC)

## 2017-04-29 NOTE — ED Provider Notes (Signed)
[facility] Emergency Department Provider Note  ____________________________________________  Time seen: Approximately 11:37 PM  I have reviewed the triage vital signs and the nursing notes.   HISTORY  Chief Complaint Chest Pain and Palpitations    HPI Brittany Ochoa is a 52 y.o. female who reports that earlier today she started getting left sided sharp intermittent chest pain, lasting a few seconds at a time. Nonradiating but associated with tingling and paresthesia of the left arm. No aggravating or alleviating factors, no shortness of breath diaphoresis or vomiting. Moderate intensity at the time. She was sitting at the desk when this occurred. She got up and started walking and felt hot, flushed and dizzyand thought she might pass out. She did not have syncope or any trauma. Afterward, her chest symptoms started to resolve but then she felt like she had a severe throbbing headache in her left parietal area and left neck.  Symptoms are now much better and essentially resolved.   Past Medical History:  Diagnosis Date  . Diabetes mellitus without complication ([place])    Prediabetes 04/29/17  . Thyroid disease    pt states no history of thyroid disease 04/29/17     There are no active problems to display for this patient.    Past Surgical History:  Procedure Laterality Date  . BREAST REDUCTION SURGERY  1998  . lipoma removal  2007   back     Prior to Admission medications   Medication Sig Start Date End Date Taking? Authorizing Provider  albuterol (PROVENTIL HFA;VENTOLIN HFA) 108 (90 Base) MCG/ACT inhaler Inhale 2 puffs into the lungs every 6 (six) hours as needed for wheezing or shortness of breath. 04/06/16   Caryn Section Linden Dolin, PA-C  brompheniramine-pseudoephedrine-DM 30-2-10 MG/5ML syrup Take 5 mLs by mouth 4 (four) times daily as needed. Patient not taking: Reported on 02/21/2017 01/17/17   Sable Feil, PA-C  cefdinir (OMNICEF) 300 MG  capsule Take 1 capsule (300 mg total) 2 (two) times daily by mouth. Patient not taking: Reported on 04/18/2017 02/21/17   Versie Starks, PA-C  clarithromycin (BIAXIN) 500 MG tablet Take 1 tablet (500 mg total) by mouth 2 (two) times daily. 01/17/17   Sable Feil, PA-C  doxycycline (VIBRA-TABS) 100 MG tablet Take 1 tablet (100 mg total) by mouth 2 (two) times daily. Patient not taking: Reported on 01/17/2017 04/12/16   Juline Patch, MD  fluticasone [facility]) 50 MCG/ACT nasal spray Place 2 sprays into both nostrils daily. 11/30/15   Fisher, Linden Dolin, PA-C  methylPREDNISolone (MEDROL DOSEPAK) 4 MG TBPK tablet Take 6 pills on day one then decrease by 1 pill each day Patient not taking: Reported on 04/18/2017 02/21/17   Versie Starks, PA-C  phentermine 15 MG capsule  10/27/16   [provider]     Allergies Codeine   Family History  Problem Relation Age of Onset  . Diabetes Mother   . Diabetes Father   . Heart disease Father   . Cancer Paternal Grandmother   . Heart disease Paternal Grandmother   . Cancer Paternal Grandfather   . Heart disease Paternal Grandfather     Social History Social History   Tobacco Use  . Smoking status: Former Smoker    Last attempt to quit: 04/17/2004    Years since quitting: 13.0  . Smokeless tobacco: Never Used  Substance Use Topics  . Alcohol use: Yes    Alcohol/week: 0.0 oz    Comment: social  . Drug  use: No    Comment: past only    Review of Systems  Constitutional:   No fever or chills.  ENT:   No sore throat. No rhinorrhea. Positive as above neck pain Cardiovascular:   Positive as above chest pain without syncope. Respiratory:   No dyspnea or cough. Gastrointestinal:   Negative for abdominal pain, vomiting and diarrhea.  Musculoskeletal:   Negative for focal pain or swelling All other systems reviewed and are negative except as documented above in ROS and HPI.  ____________________________________________   PHYSICAL  EXAM:  VITAL SIGNS: ED Triage Vitals  Enc Vitals Group     BP 04/29/17 2100 128/85     Pulse Rate 04/29/17 2100 73     Resp 04/29/17 2100 13     Temp --      Temp src --      SpO2 04/29/17 2100 96 %     Weight 04/29/17 2006 172 lb (78 kg)     Height 04/29/17 2006 5\' 3"  (1.6 m)     Head Circumference --      Peak Flow --      Pain Score 04/29/17 2003 0     Pain Loc --      Pain Edu? --      Excl. in [place]? --     Vital signs reviewed, nursing assessments reviewed.   Constitutional:   Alert and oriented. Well appearing and in no distress. Eyes:   No scleral icterus.  EOMI. No nystagmus. No conjunctival pallor. PERRL. ENT   Head:   Normocephalic and atraumatic.   Nose:   No congestion/rhinnorhea.    Mouth/Throat:   MMM, no pharyngeal erythema. No peritonsillar mass.    Neck:   No meningismus. Full ROM. Hematological/Lymphatic/Immunilogical:   No cervical lymphadenopathy. Cardiovascular:   RRR. Symmetric bilateral radial and DP pulses.  No murmurs.  Respiratory:   Normal respiratory effort without tachypnea/retractions. Breath sounds are clear and equal bilaterally. No wheezes/rales/rhonchi. Gastrointestinal:   Soft and nontender. Non distended. There is no CVA tenderness.  No rebound, rigidity, or guarding. Genitourinary:   deferred Musculoskeletal:   Normal range of motion in all extremities. No joint effusions.  No lower extremity tenderness.  No edema. Neurologic:   Normal speech and language.  Motor grossly intact. No pronator drift Normal finger to nose No gross focal neurologic deficits are appreciated.  Skin:    Skin is warm, dry and intact. No rash noted.  No petechiae, purpura, or bullae.  ____________________________________________    LABS (pertinent positives/negatives) (all labs ordered are listed, but only abnormal results are displayed) Labs Reviewed  BASIC METABOLIC PANEL - Abnormal; Notable for the following components:      Result Value    Sodium 133 (*)    Chloride 96 (*)    Glucose, Bld 107 (*)    All other components within normal limits  CBC  TROPONIN I  TROPONIN I   ____________________________________________   EKG Interpreted by me Normal sinus rhythm rate of 82, normal axis intervals QRS ST segments and T waves.   ____________________________________________    RADIOLOGY  Ct Angio Head W Or Wo Contrast  Result Date: 04/29/2017 CLINICAL DATA:  Intermittent chest pain, LEFT arm tingling and numbness tonight. Palpitations for 2 weeks. Severe headache. Possible panic attack. EXAM: CT ANGIOGRAPHY HEAD AND NECK TECHNIQUE: Multidetector CT imaging of the head and neck was performed using the standard protocol during bolus administration of intravenous contrast. Multiplanar CT image reconstructions and MIPs  were obtained to evaluate the vascular anatomy. Carotid stenosis measurements (when applicable) are obtained utilizing NASCET criteria, using the distal internal carotid diameter as the denominator. CONTRAST:  14mL ISOVUE-370 IOPAMIDOL (ISOVUE-370) INJECTION 76% COMPARISON:  None. FINDINGS: CT HEAD FINDINGS BRAIN: No intraparenchymal hemorrhage, mass effect nor midline shift. The ventricles and sulci are normal. No acute large vascular territory infarcts. No abnormal extra-axial fluid collections. Basal cisterns are patent. 6 mm pineal cyst. VASCULAR: Unremarkable. SKULL/SOFT TISSUES: No skull fracture. No significant soft tissue swelling. ORBITS/SINUSES: The included ocular globes and orbital contents are normal.The mastoid aircells and included paranasal sinuses are well-aerated. OTHER: None. CTA NECK FINDINGS: AORTIC ARCH: 4 cm ascending aorta, 2 vessel arch is a normal variant. The origins of the innominate, left Common carotid artery and subclavian artery are widely patent. RIGHT CAROTID SYSTEM: Common carotid artery is widely patent, coursing in a straight line fashion. Normal appearance of the carotid bifurcation  without hemodynamically significant stenosis by NASCET criteria. Normal appearance of the internal carotid artery. LEFT CAROTID SYSTEM: Common carotid artery is widely patent, coursing in a straight line fashion. Trace calcific atherosclerosis carotid bifurcation without hemodynamically significant stenosis by NASCET criteria. Normal appearance of the internal carotid artery. VERTEBRAL ARTERIES:Left vertebral artery is dominant. Normal appearance of the vertebral arteries, widely patent. SKELETON: No acute osseous process though bone windows have not been submitted. Severe C5-6 disc height loss with endplate sclerosis and marginal spurring compatible with degenerative disc. Moderate bilateral C5-6 neural foraminal narrowing. OTHER NECK: Soft tissues of the neck are nonacute though, not tailored for evaluation. Subcentimeter thyroid nodules, no routine follow-up by consensus. UPPER CHEST: Included lung apices are clear. No superior mediastinal lymphadenopathy. CTA HEAD FINDINGS: ANTERIOR CIRCULATION: Patent cervical internal carotid arteries, petrous, cavernous and supra clinoid internal carotid arteries. Patent anterior communicating artery. Patent anterior and middle cerebral arteries. No large vessel occlusion, significant stenosis, contrast extravasation or aneurysm. POSTERIOR CIRCULATION: Patent vertebral arteries, vertebrobasilar junction and basilar artery, as well as main branch vessels. Patent posterior cerebral arteries. Small bilateral posterior communicating arteries present. No large vessel occlusion, significant stenosis, contrast extravasation or aneurysm. VENOUS SINUSES: Major dural venous sinuses are patent though not tailored for evaluation on this angiographic examination. ANATOMIC VARIANTS: Supernumerary anterior cerebral artery arising from RIGHT A1-2 junction. DELAYED PHASE: No abnormal intracranial enhancement. MIP images reviewed. IMPRESSION: CT HEAD: 1. Negative CT HEAD with and without  contrast. CTA NECK: 1. No hemodynamically significant stenosis or acute vascular process. 2. **An incidental finding of potential clinical significance has been found. 4 cm aneurysmal ascending aorta. Recommend annual imaging followup by CTA or MRA. This recommendation follows 2010 ACCF/AHA/AATS/ACR/ASA/SCA/SCAI/SIR/STS/SVM Guidelines for the Diagnosis and Management of Patients with Thoracic Aortic Disease. Circulation. 2010; 121: L976-B341** 3. Moderate bilateral C5-6 neural foraminal narrowing. CTA HEAD: 1. Negative CTA head. Electronically Signed   By: Elon Alas M.D.   On: 04/29/2017 22:03   Dg Chest 2 View  Result Date: 04/29/2017 CLINICAL DATA:  Hervey Ard left chest pain EXAM: CHEST  2 VIEW COMPARISON:  April 12, 2016 FINDINGS: The heart size and mediastinal contours are within normal limits. Both lungs are clear. The visualized skeletal structures are unremarkable. IMPRESSION: No active cardiopulmonary disease. Electronically Signed   By: Abelardo Diesel M.D.   On: 04/29/2017 20:49   Ct Angio Neck W And/or Wo Contrast  Result Date: 04/29/2017 CLINICAL DATA:  Intermittent chest pain, LEFT arm tingling and numbness tonight. Palpitations for 2 weeks. Severe headache. Possible panic attack. EXAM: CT ANGIOGRAPHY HEAD AND  NECK TECHNIQUE: Multidetector CT imaging of the head and neck was performed using the standard protocol during bolus administration of intravenous contrast. Multiplanar CT image reconstructions and MIPs were obtained to evaluate the vascular anatomy. Carotid stenosis measurements (when applicable) are obtained utilizing NASCET criteria, using the distal internal carotid diameter as the denominator. CONTRAST:  124mL ISOVUE-370 IOPAMIDOL (ISOVUE-370) INJECTION 76% COMPARISON:  None. FINDINGS: CT HEAD FINDINGS BRAIN: No intraparenchymal hemorrhage, mass effect nor midline shift. The ventricles and sulci are normal. No acute large vascular territory infarcts. No abnormal extra-axial fluid  collections. Basal cisterns are patent. 6 mm pineal cyst. VASCULAR: Unremarkable. SKULL/SOFT TISSUES: No skull fracture. No significant soft tissue swelling. ORBITS/SINUSES: The included ocular globes and orbital contents are normal.The mastoid aircells and included paranasal sinuses are well-aerated. OTHER: None. CTA NECK FINDINGS: AORTIC ARCH: 4 cm ascending aorta, 2 vessel arch is a normal variant. The origins of the innominate, left Common carotid artery and subclavian artery are widely patent. RIGHT CAROTID SYSTEM: Common carotid artery is widely patent, coursing in a straight line fashion. Normal appearance of the carotid bifurcation without hemodynamically significant stenosis by NASCET criteria. Normal appearance of the internal carotid artery. LEFT CAROTID SYSTEM: Common carotid artery is widely patent, coursing in a straight line fashion. Trace calcific atherosclerosis carotid bifurcation without hemodynamically significant stenosis by NASCET criteria. Normal appearance of the internal carotid artery. VERTEBRAL ARTERIES:Left vertebral artery is dominant. Normal appearance of the vertebral arteries, widely patent. SKELETON: No acute osseous process though bone windows have not been submitted. Severe C5-6 disc height loss with endplate sclerosis and marginal spurring compatible with degenerative disc. Moderate bilateral C5-6 neural foraminal narrowing. OTHER NECK: Soft tissues of the neck are nonacute though, not tailored for evaluation. Subcentimeter thyroid nodules, no routine follow-up by consensus. UPPER CHEST: Included lung apices are clear. No superior mediastinal lymphadenopathy. CTA HEAD FINDINGS: ANTERIOR CIRCULATION: Patent cervical internal carotid arteries, petrous, cavernous and supra clinoid internal carotid arteries. Patent anterior communicating artery. Patent anterior and middle cerebral arteries. No large vessel occlusion, significant stenosis, contrast extravasation or aneurysm. POSTERIOR  CIRCULATION: Patent vertebral arteries, vertebrobasilar junction and basilar artery, as well as main branch vessels. Patent posterior cerebral arteries. Small bilateral posterior communicating arteries present. No large vessel occlusion, significant stenosis, contrast extravasation or aneurysm. VENOUS SINUSES: Major dural venous sinuses are patent though not tailored for evaluation on this angiographic examination. ANATOMIC VARIANTS: Supernumerary anterior cerebral artery arising from RIGHT A1-2 junction. DELAYED PHASE: No abnormal intracranial enhancement. MIP images reviewed. IMPRESSION: CT HEAD: 1. Negative CT HEAD with and without contrast. CTA NECK: 1. No hemodynamically significant stenosis or acute vascular process. 2. **An incidental finding of potential clinical significance has been found. 4 cm aneurysmal ascending aorta. Recommend annual imaging followup by CTA or MRA. This recommendation follows 2010 ACCF/AHA/AATS/ACR/ASA/SCA/SCAI/SIR/STS/SVM Guidelines for the Diagnosis and Management of Patients with Thoracic Aortic Disease. Circulation. 2010; 121: [id number]** 3. Moderate bilateral C5-6 neural foraminal narrowing. CTA HEAD: 1. Negative CTA head. Electronically Signed   By: Elon Alas M.D.   On: 04/29/2017 22:03   Ct Angio Chest Aorta W And/or Wo Contrast  Result Date: 04/29/2017 CLINICAL DATA:  52 year old female with chest and back pain. EXAM: CT ANGIOGRAPHY CHEST WITH CONTRAST TECHNIQUE: Multidetector CT imaging of the chest was performed using the standard protocol during bolus administration of intravenous contrast. Multiplanar CT image reconstructions and MIPs were obtained to evaluate the vascular anatomy. CONTRAST:  175mL ISOVUE-370 IOPAMIDOL (ISOVUE-370) INJECTION 76% COMPARISON:  Chest radiograph dated 04/29/2017 FINDINGS: Cardiovascular:  There is no cardiomegaly. Small pericardial effusion measures up to 8 mm in thickness. The thoracic aorta is unremarkable. The origins of the  great vessels of the aortic arch are patent. Evaluation of the pulmonary arteries is very limited due to suboptimal enhancement and timing of the contrast. No definite large or central pulmonary artery embolus identified. Mediastinum/Nodes: There is no hilar or mediastinal adenopathy. The esophagus is grossly unremarkable. There is a 7 mm hypodense right thyroid nodule. Further evaluation with ultrasound recommended. No mediastinal fluid collection. Lungs/Pleura: Linear bilateral atelectasis/scarring. The lungs are clear. There is no pleural effusion or pneumothorax. The central airways are patent Upper Abdomen: Diffuse fatty infiltration of the liver. Small foci of enhancement in the spleen are not well characterized but may represent a flash filling hemangioma. The visualized upper abdomen is otherwise unremarkable. Musculoskeletal: Mild degenerative changes of the spine. No acute osseous pathology there is a pectus carinatum deformity. Review of the MIP images confirms the above findings. IMPRESSION: 1. Very limited almost nondiagnostic evaluation of the pulmonary arteries. No definite central or large pulmonary artery embolus identified. 2. Small pericardial effusion. 3. Mild fatty liver. 4. A 7 mm right thyroid hypodense nodule. Further evaluation with ultrasound recommended. Electronically Signed   By: Anner Crete M.D.   On: 04/29/2017 22:04    ____________________________________________   PROCEDURES Procedures  ____________________________________________    CLINICAL IMPRESSION / ASSESSMENT AND PLAN / ED COURSE  Pertinent labs & imaging results that were available during my care of the patient were reviewed by me and considered in my medical decision making (see chart for details).     Clinical Course as of Apr 30 2335  Nancy Fetter Apr 29, 2017  2104 P/w multiple sudden severe complaints with assoc. Neuro sx in LUE. Will obtain CTA to eval chest, neck, head for dissection or aneurysm. Sx  much improved on arrival to ED, BP close to normal. If negative and delta trop neg, pt will be suitable for outpt follow up.   [PS]  2211 Reviewed emr. June 2017 thyroid panel unremarkable.   [PS]  2211 CT chest, neck, head unremarkable. Incidental ascending aortic aneurysm and thyroid nodule. Pt will be informed and instructed on outpatient follow up.   [PS]    Clinical Course User Index [PS] Carrie Mew, MD    ----------------------------------------- 11:41 PM on 04/29/2017 -----------------------------------------  Care patient signed out to Dr. Dahlia Client to follow-up on second troponin. If unremarkable patient is suitable for discharge home and outpatient follow-up. Follow-up information regarding incidental findings provided to the patient in discharge instructions.   ____________________________________________   FINAL CLINICAL IMPRESSION(S) / ED DIAGNOSES    Final diagnoses:  Atypical chest pain  Palpitations  Ascending aortic aneurysm (HCC)  Thyroid nodule       Portions of this note were generated with dragon dictation software. Dictation errors may occur despite best attempts at proofreading.    Carrie Mew, MD 04/29/17 2342

## 2017-04-29 NOTE — ED Notes (Signed)
Pt arrived POV from home with c/o left sided neck pain and numbness in her left arm; EMS was dispatched to her house and pt says her EKG was fine; pt reports "I still just don't feel right"

## 2017-04-30 NOTE — ED Provider Notes (Signed)
-----------------------------------------   12:34 AM on 04/30/2017 -----------------------------------------   Blood pressure 128/85, pulse 73, resp. rate 13, height 5\' 3"  (1.6 m), weight 78 kg (172 lb), last menstrual period 09/11/2016, SpO2 96 %.  Assuming care from Dr. Joni Fears.  In short, Brittany Ochoa is a 52 y.o. female with a chief complaint of Chest Pain and Palpitations .  Refer to the original H&P for additional details.  The current plan of care is to follow up the repeat troponin and disposition the patient.   Clinical Course as of Apr 30 32  Sun Apr 29, 2017  2104 P/w multiple sudden severe complaints with assoc. Neuro sx in LUE. Will obtain CTA to eval chest, neck, head for dissection or aneurysm. Sx much improved on arrival to ED, BP close to normal. If negative and delta trop neg, pt will be suitable for outpt follow up.   [PS]  2211 Reviewed emr. June 2017 thyroid panel unremarkable.   [PS]  2211 CT chest, neck, head unremarkable. Incidental ascending aortic aneurysm and thyroid nodule. Pt will be informed and instructed on outpatient follow up.   [PS]    Clinical Course User Index [PS] Carrie Mew, MD   The patient's repeat troponin was 0.03.  The remaining studies are also unremarkable.  The patient will be discharged home and should follow-up with her primary care physician for further evaluation of her complaints.  I discussed this with the patient and she understands and agrees with the plan.   Loney Hering, MD 04/30/17 [phone]

## 2017-05-04 ENCOUNTER — Encounter (INDEPENDENT_AMBULATORY_CARE_PROVIDER_SITE_OTHER): Payer: Self-pay | Admitting: Vascular Surgery

## 2017-05-04 ENCOUNTER — Ambulatory Visit (INDEPENDENT_AMBULATORY_CARE_PROVIDER_SITE_OTHER): Payer: 59 | Admitting: Vascular Surgery

## 2017-05-04 VITALS — BP 138/93 | HR 73 | Resp 15 | Ht 63.0 in | Wt 173.0 lb

## 2017-05-04 DIAGNOSIS — R7301 Impaired fasting glucose: Secondary | ICD-10-CM | POA: Insufficient documentation

## 2017-05-04 DIAGNOSIS — R030 Elevated blood-pressure reading, without diagnosis of hypertension: Secondary | ICD-10-CM | POA: Insufficient documentation

## 2017-05-04 DIAGNOSIS — E119 Type 2 diabetes mellitus without complications: Secondary | ICD-10-CM

## 2017-05-04 DIAGNOSIS — I712 Thoracic aortic aneurysm, without rupture, unspecified: Secondary | ICD-10-CM

## 2017-05-04 DIAGNOSIS — I7121 Aneurysm of the ascending aorta, without rupture: Secondary | ICD-10-CM | POA: Insufficient documentation

## 2017-05-04 NOTE — Progress Notes (Signed)
Patient ID: Brittany Ochoa, female   DOB: May 28, 1965, 52 y.o.   MRN: [id number]  Chief Complaint  Patient presents with  . Follow-up    1 week [facility] f/u    HPI Brittany Ochoa is a 52 y.o. female.  I am asked to see the patient by Dr. Joni Fears at the [facility] ED for evaluation of ascending aortic aneurysm.  The patient reports an episode about a week ago where she had chest pain, dizziness, left shoulder and arm pain and was concerned about a heart attack.  This prompted a visit to the emergency room.  There was no clear inciting event or causative factor that started the symptoms.  She has no fevers or chills.  The symptoms are all resolved now.  As part of her workup in the emergency department she had a CT scan which I have independently reviewed.  This is a CT scan of the chest as well as a CT scan of the neck.  On this scan, there is an approximately 4.0 cm dilatation of her ascending aorta.  There is really no atherosclerosis.  There is no dissection.  No aneurysmal degeneration was seen on the proximal ascending aorta   Past Medical History:  Diagnosis Date  . Diabetes mellitus without complication ([place])    Prediabetes 04/29/17  . Thyroid disease    pt states no history of thyroid disease 04/29/17    Past Surgical History:  Procedure Laterality Date  . BREAST REDUCTION SURGERY  1998  . lipoma removal  2007   back    Family History  Problem Relation Age of Onset  . Diabetes Mother   . Diabetes Father   . Heart disease Father   . Cancer Paternal Grandmother   . Heart disease Paternal Grandmother   . Cancer Paternal Grandfather   . Heart disease Paternal Grandfather      Social History Social History   Tobacco Use  . Smoking status: Former Smoker    Last attempt to quit: 04/17/2004    Years since quitting: 13.0  . Smokeless tobacco: Never Used  Substance Use Topics  . Alcohol use: Yes    Alcohol/week: 0.0 oz    Comment: social  . Drug use: No    Comment:  past only     Allergies  Allergen Reactions  . Codeine     Current Outpatient Medications  Medication Sig Dispense Refill  . albuterol (PROVENTIL HFA;VENTOLIN HFA) 108 (90 Base) MCG/ACT inhaler Inhale 2 puffs into the lungs every 6 (six) hours as needed for wheezing or shortness of breath. 1 Inhaler 0  . fluticasone (FLONASE) 50 MCG/ACT nasal spray Place 2 sprays into both nostrils daily. 16 g 6  . brompheniramine-pseudoephedrine-DM 30-2-10 MG/5ML syrup Take 5 mLs by mouth 4 (four) times daily as needed. (Patient not taking: Reported on 02/21/2017) 120 mL 0  . cefdinir (OMNICEF) 300 MG capsule Take 1 capsule (300 mg total) 2 (two) times daily by mouth. (Patient not taking: Reported on 04/18/2017) 20 capsule 0  . clarithromycin (BIAXIN) 500 MG tablet Take 1 tablet (500 mg total) by mouth 2 (two) times daily. (Patient not taking: Reported on 05/04/2017) 20 tablet 0  . doxycycline (VIBRA-TABS) 100 MG tablet Take 1 tablet (100 mg total) by mouth 2 (two) times daily. (Patient not taking: Reported on 01/17/2017) 20 tablet 0  . methylPREDNISolone (MEDROL DOSEPAK) 4 MG TBPK tablet Take 6 pills on day one then decrease by 1 pill each day (Patient not  taking: Reported on 04/18/2017) 21 tablet 0  . phentermine 15 MG capsule   1   No current facility-administered medications for this visit.       REVIEW OF SYSTEMS (Negative unless checked)  Constitutional: [] Weight loss  [] Fever  [] Chills Cardiac: [x] Chest pain   [] Chest pressure   [x] Palpitations   [] Shortness of breath when laying flat   [] Shortness of breath at rest   [x] Shortness of breath with exertion. Vascular:  [] Pain in legs with walking   [] Pain in legs at rest   [] Pain in legs when laying flat   [] Claudication   [] Pain in feet when walking  [] Pain in feet at rest  [] Pain in feet when laying flat   [] History of DVT   [] Phlebitis   [] Swelling in legs   [] Varicose veins   [] Non-healing ulcers Pulmonary:   [] Uses home oxygen   [] Productive cough    [] Hemoptysis   [] Wheeze  [] COPD   [] Asthma Neurologic:  [] Dizziness  [] Blackouts   [] Seizures   [] History of stroke   [] History of TIA  [] Aphasia   [] Temporary blindness   [] Dysphagia   [] Weakness or numbness in arms   [] Weakness or numbness in legs Musculoskeletal:  [x] Arthritis   [] Joint swelling   [] Joint pain   [x] Low back pain Hematologic:  [] Easy bruising  [] Easy bleeding   [] Hypercoagulable state   [] Anemic  [] Hepatitis Gastrointestinal:  [] Blood in stool   [] Vomiting blood  [] Gastroesophageal reflux/heartburn   [] Abdominal pain Genitourinary:  [] Chronic kidney disease   [] Difficult urination  [] Frequent urination  [] Burning with urination   [] Hematuria Skin:  [] Rashes   [] Ulcers   [] Wounds Psychological:  [] History of anxiety   []  History of major depression.    Physical Exam BP (!) 138/93 (BP Location: Left Arm, Patient Position: Sitting)   Pulse 73   Resp 15   Ht 5' 3"  (1.6 m)   Wt 78.5 kg (173 lb)   LMP 09/11/2016   BMI 30.65 kg/m  Gen:  WD/WN, NAD Head: [place]/AT, No temporalis wasting.  Ear/Nose/Throat: Hearing grossly intact, nares w/o erythema or drainage, oropharynx w/o Erythema/Exudate Eyes: Conjunctiva clear, sclera non-icteric  Neck: trachea midline.  No JVD.  Pulmonary:  Good air movement, respirations not labored, no use of accessory muscles Cardiac: RRR, no JVD Vascular:  Vessel Right Left  Radial Palpable Palpable                          PT Palpable Palpable  DP Palpable Palpable   Gastrointestinal: soft, non-tender/non-distended.  Musculoskeletal: M/S 5/5 throughout.  Extremities without ischemic changes.  No deformity or atrophy. No edema. Neurologic: Sensation grossly intact in extremities.  Symmetrical.  Speech is fluent. Motor exam as listed above. Psychiatric: Judgment intact, Mood & affect appropriate for pt's clinical situation. Dermatologic: No rashes or ulcers noted.  No cellulitis or open wounds.    Radiology Ct Angio Head W Or Wo  Contrast  Result Date: 04/29/2017 CLINICAL DATA:  Intermittent chest pain, LEFT arm tingling and numbness tonight. Palpitations for 2 weeks. Severe headache. Possible panic attack. EXAM: CT ANGIOGRAPHY HEAD AND NECK TECHNIQUE: Multidetector CT imaging of the head and neck was performed using the standard protocol during bolus administration of intravenous contrast. Multiplanar CT image reconstructions and MIPs were obtained to evaluate the vascular anatomy. Carotid stenosis measurements (when applicable) are obtained utilizing NASCET criteria, using the distal internal carotid diameter as the denominator. CONTRAST:  149m ISOVUE-370 IOPAMIDOL (ISOVUE-370) INJECTION 76%  COMPARISON:  None. FINDINGS: CT HEAD FINDINGS BRAIN: No intraparenchymal hemorrhage, mass effect nor midline shift. The ventricles and sulci are normal. No acute large vascular territory infarcts. No abnormal extra-axial fluid collections. Basal cisterns are patent. 6 mm pineal cyst. VASCULAR: Unremarkable. SKULL/SOFT TISSUES: No skull fracture. No significant soft tissue swelling. ORBITS/SINUSES: The included ocular globes and orbital contents are normal.The mastoid aircells and included paranasal sinuses are well-aerated. OTHER: None. CTA NECK FINDINGS: AORTIC ARCH: 4 cm ascending aorta, 2 vessel arch is a normal variant. The origins of the innominate, left Common carotid artery and subclavian artery are widely patent. RIGHT CAROTID SYSTEM: Common carotid artery is widely patent, coursing in a straight line fashion. Normal appearance of the carotid bifurcation without hemodynamically significant stenosis by NASCET criteria. Normal appearance of the internal carotid artery. LEFT CAROTID SYSTEM: Common carotid artery is widely patent, coursing in a straight line fashion. Trace calcific atherosclerosis carotid bifurcation without hemodynamically significant stenosis by NASCET criteria. Normal appearance of the internal carotid artery. VERTEBRAL  ARTERIES:Left vertebral artery is dominant. Normal appearance of the vertebral arteries, widely patent. SKELETON: No acute osseous process though bone windows have not been submitted. Severe C5-6 disc height loss with endplate sclerosis and marginal spurring compatible with degenerative disc. Moderate bilateral C5-6 neural foraminal narrowing. OTHER NECK: Soft tissues of the neck are nonacute though, not tailored for evaluation. Subcentimeter thyroid nodules, no routine follow-up by consensus. UPPER CHEST: Included lung apices are clear. No superior mediastinal lymphadenopathy. CTA HEAD FINDINGS: ANTERIOR CIRCULATION: Patent cervical internal carotid arteries, petrous, cavernous and supra clinoid internal carotid arteries. Patent anterior communicating artery. Patent anterior and middle cerebral arteries. No large vessel occlusion, significant stenosis, contrast extravasation or aneurysm. POSTERIOR CIRCULATION: Patent vertebral arteries, vertebrobasilar junction and basilar artery, as well as main branch vessels. Patent posterior cerebral arteries. Small bilateral posterior communicating arteries present. No large vessel occlusion, significant stenosis, contrast extravasation or aneurysm. VENOUS SINUSES: Major dural venous sinuses are patent though not tailored for evaluation on this angiographic examination. ANATOMIC VARIANTS: Supernumerary anterior cerebral artery arising from RIGHT A1-2 junction. DELAYED PHASE: No abnormal intracranial enhancement. MIP images reviewed. IMPRESSION: CT HEAD: 1. Negative CT HEAD with and without contrast. CTA NECK: 1. No hemodynamically significant stenosis or acute vascular process. 2. **An incidental finding of potential clinical significance has been found. 4 cm aneurysmal ascending aorta. Recommend annual imaging followup by CTA or MRA. This recommendation follows 2010 ACCF/AHA/AATS/ACR/ASA/SCA/SCAI/SIR/STS/SVM Guidelines for the Diagnosis and Management of Patients with  Thoracic Aortic Disease. Circulation. 2010; 121: P509-T267** 3. Moderate bilateral C5-6 neural foraminal narrowing. CTA HEAD: 1. Negative CTA head. Electronically Signed   By: Elon Alas M.D.   On: 04/29/2017 22:03   Dg Chest 2 View  Result Date: 04/29/2017 CLINICAL DATA:  Hervey Ard left chest pain EXAM: CHEST  2 VIEW COMPARISON:  April 12, 2016 FINDINGS: The heart size and mediastinal contours are within normal limits. Both lungs are clear. The visualized skeletal structures are unremarkable. IMPRESSION: No active cardiopulmonary disease. Electronically Signed   By: Abelardo Diesel M.D.   On: 04/29/2017 20:49   Ct Angio Neck W And/or Wo Contrast  Result Date: 04/29/2017 CLINICAL DATA:  Intermittent chest pain, LEFT arm tingling and numbness tonight. Palpitations for 2 weeks. Severe headache. Possible panic attack. EXAM: CT ANGIOGRAPHY HEAD AND NECK TECHNIQUE: Multidetector CT imaging of the head and neck was performed using the standard protocol during bolus administration of intravenous contrast. Multiplanar CT image reconstructions and MIPs were obtained to evaluate the vascular  anatomy. Carotid stenosis measurements (when applicable) are obtained utilizing NASCET criteria, using the distal internal carotid diameter as the denominator. CONTRAST:  169m ISOVUE-370 IOPAMIDOL (ISOVUE-370) INJECTION 76% COMPARISON:  None. FINDINGS: CT HEAD FINDINGS BRAIN: No intraparenchymal hemorrhage, mass effect nor midline shift. The ventricles and sulci are normal. No acute large vascular territory infarcts. No abnormal extra-axial fluid collections. Basal cisterns are patent. 6 mm pineal cyst. VASCULAR: Unremarkable. SKULL/SOFT TISSUES: No skull fracture. No significant soft tissue swelling. ORBITS/SINUSES: The included ocular globes and orbital contents are normal.The mastoid aircells and included paranasal sinuses are well-aerated. OTHER: None. CTA NECK FINDINGS: AORTIC ARCH: 4 cm ascending aorta, 2 vessel arch is  a normal variant. The origins of the innominate, left Common carotid artery and subclavian artery are widely patent. RIGHT CAROTID SYSTEM: Common carotid artery is widely patent, coursing in a straight line fashion. Normal appearance of the carotid bifurcation without hemodynamically significant stenosis by NASCET criteria. Normal appearance of the internal carotid artery. LEFT CAROTID SYSTEM: Common carotid artery is widely patent, coursing in a straight line fashion. Trace calcific atherosclerosis carotid bifurcation without hemodynamically significant stenosis by NASCET criteria. Normal appearance of the internal carotid artery. VERTEBRAL ARTERIES:Left vertebral artery is dominant. Normal appearance of the vertebral arteries, widely patent. SKELETON: No acute osseous process though bone windows have not been submitted. Severe C5-6 disc height loss with endplate sclerosis and marginal spurring compatible with degenerative disc. Moderate bilateral C5-6 neural foraminal narrowing. OTHER NECK: Soft tissues of the neck are nonacute though, not tailored for evaluation. Subcentimeter thyroid nodules, no routine follow-up by consensus. UPPER CHEST: Included lung apices are clear. No superior mediastinal lymphadenopathy. CTA HEAD FINDINGS: ANTERIOR CIRCULATION: Patent cervical internal carotid arteries, petrous, cavernous and supra clinoid internal carotid arteries. Patent anterior communicating artery. Patent anterior and middle cerebral arteries. No large vessel occlusion, significant stenosis, contrast extravasation or aneurysm. POSTERIOR CIRCULATION: Patent vertebral arteries, vertebrobasilar junction and basilar artery, as well as main branch vessels. Patent posterior cerebral arteries. Small bilateral posterior communicating arteries present. No large vessel occlusion, significant stenosis, contrast extravasation or aneurysm. VENOUS SINUSES: Major dural venous sinuses are patent though not tailored for evaluation on  this angiographic examination. ANATOMIC VARIANTS: Supernumerary anterior cerebral artery arising from RIGHT A1-2 junction. DELAYED PHASE: No abnormal intracranial enhancement. MIP images reviewed. IMPRESSION: CT HEAD: 1. Negative CT HEAD with and without contrast. CTA NECK: 1. No hemodynamically significant stenosis or acute vascular process. 2. **An incidental finding of potential clinical significance has been found. 4 cm aneurysmal ascending aorta. Recommend annual imaging followup by CTA or MRA. This recommendation follows 2010 ACCF/AHA/AATS/ACR/ASA/SCA/SCAI/SIR/STS/SVM Guidelines for the Diagnosis and Management of Patients with Thoracic Aortic Disease. Circulation. 2010; 121: [id number]* 3. Moderate bilateral C5-6 neural foraminal narrowing. CTA HEAD: 1. Negative CTA head. Electronically Signed   By: CElon AlasM.D.   On: 04/29/2017 22:03   Ct Angio Chest Aorta W And/or Wo Contrast  Result Date: 04/29/2017 CLINICAL DATA:  52year old female with chest and back pain. EXAM: CT ANGIOGRAPHY CHEST WITH CONTRAST TECHNIQUE: Multidetector CT imaging of the chest was performed using the standard protocol during bolus administration of intravenous contrast. Multiplanar CT image reconstructions and MIPs were obtained to evaluate the vascular anatomy. CONTRAST:  1284mISOVUE-370 IOPAMIDOL (ISOVUE-370) INJECTION 76% COMPARISON:  Chest radiograph dated 04/29/2017 FINDINGS: Cardiovascular: There is no cardiomegaly. Small pericardial effusion measures up to 8 mm in thickness. The thoracic aorta is unremarkable. The origins of the great vessels of the aortic arch are patent. Evaluation of the  pulmonary arteries is very limited due to suboptimal enhancement and timing of the contrast. No definite large or central pulmonary artery embolus identified. Mediastinum/Nodes: There is no hilar or mediastinal adenopathy. The esophagus is grossly unremarkable. There is a 7 mm hypodense right thyroid nodule. Further evaluation  with ultrasound recommended. No mediastinal fluid collection. Lungs/Pleura: Linear bilateral atelectasis/scarring. The lungs are clear. There is no pleural effusion or pneumothorax. The central airways are patent Upper Abdomen: Diffuse fatty infiltration of the liver. Small foci of enhancement in the spleen are not well characterized but may represent a flash filling hemangioma. The visualized upper abdomen is otherwise unremarkable. Musculoskeletal: Mild degenerative changes of the spine. No acute osseous pathology there is a pectus carinatum deformity. Review of the MIP images confirms the above findings. IMPRESSION: 1. Very limited almost nondiagnostic evaluation of the pulmonary arteries. No definite central or large pulmonary artery embolus identified. 2. Small pericardial effusion. 3. Mild fatty liver. 4. A 7 mm right thyroid hypodense nodule. Further evaluation with ultrasound recommended. Electronically Signed   By: Anner Crete M.D.   On: 04/29/2017 22:04    Labs Recent Results (from the past 2160 hour(s))  Basic metabolic panel     Status: Abnormal   Collection Time: 04/29/17  8:05 PM  Result Value Ref Range   Sodium 133 (L) 135 - 145 mmol/L   Potassium 3.6 3.5 - 5.1 mmol/L   Chloride 96 (L) 101 - 111 mmol/L   CO2 26 22 - 32 mmol/L   Glucose, Bld 107 (H) 65 - 99 mg/dL   BUN 16 6 - 20 mg/dL   Creatinine, Ser 0.66 0.44 - 1.00 mg/dL   Calcium 9.4 8.9 - 10.3 mg/dL   GFR calc non Af Amer >60 >60 mL/min   GFR calc Af Amer >60 >60 mL/min    Comment: (NOTE) The eGFR has been calculated using the CKD EPI equation. This calculation has not been validated in all clinical situations. eGFR's persistently <60 mL/min signify possible Chronic Kidney Disease.    Anion gap 11 5 - 15    Comment: Performed at [facility], [address] [place], [place] [address]  CBC     Status: None   Collection Time: 04/29/17  8:05 PM  Result Value Ref Range   WBC 8.0 3.6 - 11.0 K/uL   RBC 4.80  3.80 - 5.20 MIL/uL   Hemoglobin 14.5 12.0 - 16.0 g/dL   HCT 42.0 35.0 - 47.0 %   MCV 87.6 80.0 - 100.0 fL   MCH 30.2 26.0 - 34.0 pg   MCHC 34.5 32.0 - 36.0 g/dL   RDW 13.2 11.5 - 14.5 %   Platelets 248 150 - 440 K/uL    Comment: Performed at [facility], [address] [place], [place] [address]  Troponin I     Status: None   Collection Time: 04/29/17  8:05 PM  Result Value Ref Range   Troponin I <0.03 <0.03 ng/mL    Comment: Performed at [facility], [address] [place], [place] [address]  Troponin I     Status: None   Collection Time: 04/29/17 11:00 PM  Result Value Ref Range   Troponin I <0.03 <0.03 ng/mL    Comment: Performed at [facility], [address] [place], [place] [address]    Assessment/Plan:  Diabetes mellitus without complication ([place]) blood glucose control important in reducing the progression of atherosclerotic disease. Also, involved in wound healing.  Labeled a prediabetic at  this point.  Not requiring any medications.  Diet and exercise primarily.   Elevated BP without diagnosis of hypertension It was discussed the importance of keeping her blood pressure under control as this is the predominant risk factor for her degenerating a larger she no longer smokes.  She is going to keep a log of her blood pressure and discuss this with her primary care physician who she is to see in the next couple of months.  She may need to be on medications if she is running continuously greater than 202 systolic or 85 diastolic.  Ascending aortic aneurysm [facility]) This is a CT scan of the chest as well as a CT scan of the neck.  On this scan, there is an approximately 4.0 cm dilatation of her ascending aorta.  There is really no atherosclerosis.  There is no dissection.  No aneurysmal degeneration was seen outside of the proximal ascending aorta. We had a long discussion today.  This would just qualify as an ascending aortic aneurysm.  I have  recommended she get her blood pressure under better control.  She should remain abstinent from tobacco.  No specific limitations in terms of activities at this point.  Recheck in 1 year with a CT scan of the chest.      Leotis Pain 05/04/2017, 1:02 PM   This note was created with Dragon medical transcription system.  Any errors from dictation are unintentional.

## 2017-05-04 NOTE — Assessment & Plan Note (Signed)
It was discussed the importance of keeping her blood pressure under control as this is the predominant risk factor for her degenerating a larger she no longer smokes.  She is going to keep a log of her blood pressure and discuss this with her primary care physician who she is to see in the next couple of months.  She may need to be on medications if she is running continuously greater than 694 systolic or 85 diastolic.

## 2017-05-04 NOTE — Assessment & Plan Note (Signed)
This is a CT scan of the chest as well as a CT scan of the neck.  On this scan, there is an approximately 4.0 cm dilatation of her ascending aorta.  There is really no atherosclerosis.  There is no dissection.  No aneurysmal degeneration was seen outside of the proximal ascending aorta. We had a long discussion today.  This would just qualify as an ascending aortic aneurysm.  I have recommended she get her blood pressure under better control.  She should remain abstinent from tobacco.  No specific limitations in terms of activities at this point.  Recheck in 1 year with a CT scan of the chest.

## 2017-05-04 NOTE — Patient Instructions (Signed)
Thoracic Aortic Aneurysm An aneurysm is a bulge in an artery. It happens when blood pushes up against a weakened or damaged artery wall. A thoracic aortic aneurysm is an aneurysm that occurs in the first part of the aorta, between the heart and the diaphragm. The aorta is the main artery of the body. It supplies blood from the heart to the rest of the body. Some aneurysms may not cause symptoms or problems. However, the major concern with a thoracic aortic aneurysm is that it can enlarge and burst (rupture), or blood can flow between the layers of the wall of the aorta through a tear (aorticdissection). Both of these conditions can cause bleeding inside the body and can be life-threatening if they are not diagnosed and treated right away. What are the causes? The exact cause of this condition is not known. What increases the risk? The following factors may make you more likely to develop this condition:  Being age 65 or older.  Having a hardening of the arteries caused by the buildup of fat and other substances in the lining of a blood vessel (arteriosclerosis).  Having inflammation of the walls of an artery (arteritis).  Having a genetic disease that weakens the body's connective tissue, such as Marfan syndrome.  Having an injury or trauma to the aorta.  Having an infection that is caused by bacteria, such as syphilis or staphylococcus, in the wall of the aorta (infectious aortitis).  Having high blood pressure (hypertension).  Being female.  Being white (Caucasian).  Having high cholesterol.  Having a family history of aneurysms.  Using tobacco.  Having chronic obstructive pulmonary disease (COPD).  What are the signs or symptoms? Symptoms of this condition vary depending on the size and rate of growth of the aneurysm. Most grow slowly and do not cause any symptoms. When symptoms do occur, they may include:  Pain in the chest, back, sides, or abdomen. The pain may vary in  intensity. A sudden onset of severe pain may indicate that the aneurysm has ruptured.  Hoarseness.  Cough.  Shortness of breath.  Swallowing problems.  Swelling in the face, arms, or legs.  Fever.  Unexplained weight loss.  How is this diagnosed? This condition may be diagnosed with:  An ultrasound.  X-rays.  A CT scan.  An MRI.  Tests to check the arteries for damage or blockages (angiogram).  Most unruptured thoracic aortic aneurysms cause no symptoms, so they are often found during exams for other conditions. How is this treated? Treatment for this condition depends on:  The size of the aneurysm.  How fast the aneurysm is growing.  Your age.  Risk factors for rupture.  Aneurysms that are smaller than 2.2 inches (5.5 cm) may be managed by using medicines to control blood pressure, manage pain, or fight infection. You may need regular monitoring to see if the aneurysm is getting bigger. Your health care provider may recommend that you have an ultrasound every year or every 6 months. How often you need to have an ultrasound depends on the size of the aneurysm, how fast it is growing, and whether you have a family history of aneurysms. Surgical repair may be needed if your aneurysm is larger than 2.2 inches or if it is growing quickly. Follow these instructions at home: Eating and drinking  Eat a healthy diet. Your health care provider may recommend that you: ? Lower your salt (sodium) intake. In some people, too much salt can raise blood pressure and increase   the risk of thoracic aortic aneurysm. ? Avoid foods that are high in saturated fat and cholesterol, such as red meat and dairy. ? Eat a diet that is low in sugar. ? Increase your fiber intake by including whole grains, vegetables, and fruits in your diet. Eating these foods may help to lower blood pressure.  Limit or avoid alcohol as recommended by your health care provider. Lifestyle  Follow instructions  from your health care provider about healthy lifestyle habits. Your health care provider may recommend that you: ? Do not use any products that contain nicotine or tobacco, such as cigarettes and e-cigarettes. If you need help quitting, ask your health care provider. ? Keep your blood pressure within normal limits. The target limit for most people is below 120/80. Check your blood pressure regularly. If it is high, ask your health care provider about ways that you can control it. ? Keep your blood sugar (glucose) level and cholesterol levels within normal limits. Target limits for most people are:  Blood glucose level: Less than 100 mg/dL.  Total cholesterol level: Less than 200 mg/dL. ? Maintain a healthy weight. Activity  Stay physically active and exercise regularly. Talk with your health care provider about how often you should exercise and ask which types of exercise are safe for you.  Avoid heavy lifting and activities that take a lot of effort (are strenuous). Ask your health care provider what activities are safe for you. General instructions  Keep all follow-up visits as told by your health care provider. This is important. ? Talk with your health care provider about regular screenings to see if the aneurysm is getting bigger.  Take over-the-counter and prescription medicines only as told by your health care provider. Contact a health care provider if:  You have discomfort in your upper back, neck, or abdomen.  You have trouble swallowing.  You have a cough or hoarseness.  You have a family history of aneurysms.  You have unexplained weight loss. Get help right away if:  You have sudden, severe pain in your upper back and abdomen. This pain may move into your chest and arms.  You have shortness of breath.  You have a fever. This information is not intended to replace advice given to you by your health care provider. Make sure you discuss any questions you have with your  health care provider. Document Released: 04/03/2005 Document Revised: 01/14/2016 Document Reviewed: 01/14/2016 Elsevier Interactive Patient Education  2018 [employer]  

## 2017-05-04 NOTE — Assessment & Plan Note (Signed)
blood glucose control important in reducing the progression of atherosclerotic disease. Also, involved in wound healing.  Labeled a prediabetic at this point.  Not requiring any medications.  Diet and exercise primarily.

## 2017-06-05 ENCOUNTER — Encounter: Payer: Self-pay | Admitting: Vascular Surgery

## 2017-06-05 ENCOUNTER — Ambulatory Visit (INDEPENDENT_AMBULATORY_CARE_PROVIDER_SITE_OTHER): Payer: 59 | Admitting: Vascular Surgery

## 2017-06-05 VITALS — BP 131/89 | HR 73 | Temp 98.7°F | Resp 16 | Ht 63.0 in | Wt 172.0 lb

## 2017-06-05 DIAGNOSIS — I712 Thoracic aortic aneurysm, without rupture: Secondary | ICD-10-CM

## 2017-06-05 DIAGNOSIS — I7121 Aneurysm of the ascending aorta, without rupture: Secondary | ICD-10-CM

## 2017-06-05 NOTE — Progress Notes (Signed)
The patient presented today for discussion of her a sending thoracic aortic aneurysm.  Had seen Dr. Lucky Cowboy for evaluation of this at [facility].  Wanted another option for discussion of this.  Explained that she needed to be evaluated by [facility].  Made referral to them.  Explained that there would be no charge for today's visit

## 2017-06-29 ENCOUNTER — Encounter: Payer: Self-pay | Admitting: Cardiovascular Disease

## 2017-06-29 ENCOUNTER — Ambulatory Visit: Payer: 59 | Admitting: Cardiovascular Disease

## 2017-06-29 VITALS — BP 118/78 | HR 72 | Ht 63.0 in | Wt 180.5 lb

## 2017-06-29 DIAGNOSIS — R0602 Shortness of breath: Secondary | ICD-10-CM

## 2017-06-29 DIAGNOSIS — R002 Palpitations: Secondary | ICD-10-CM | POA: Diagnosis not present

## 2017-06-29 DIAGNOSIS — I7121 Aneurysm of the ascending aorta, without rupture: Secondary | ICD-10-CM

## 2017-06-29 DIAGNOSIS — I712 Thoracic aortic aneurysm, without rupture: Secondary | ICD-10-CM | POA: Diagnosis not present

## 2017-06-29 NOTE — Patient Instructions (Signed)
Medication Instructions: - Your physician recommends that you continue on your current medications as directed. Please refer to the Current Medication list given to you today.  Labwork: - none ordered  Procedures/Testing: - Your physician has requested that you have an echocardiogram. Echocardiography is a painless test that uses sound waves to create images of your heart. It provides your doctor with information about the size and shape of your heart and how well your heart's chambers and valves are working. This procedure takes approximately one hour. There are no restrictions for this procedure.  Follow-Up: - as needed with Dr. Fletcher Anon.  Any Additional Special Instructions Will Be Listed Below (If Applicable).     If you need a refill on your cardiac medications before your next appointment, please call your pharmacy.

## 2017-06-29 NOTE — Progress Notes (Signed)
Cardiology Office Note   Date:  06/29/2017   ID:  Brittany Ochoa, DOB 02-16-1966, MRN [id number]  PCP:  [name], DO  Cardiologist:   Kathlyn Sacramento, MD   Chief Complaint  Patient presents with  . OTHER    Heart Palpitations pt recently diagnosed w/ aaa. Meds reviewed verbally with pt.      History of Present Illness: Brittany Ochoa is a 52 y.o. female who is self-referred for evaluation of palpitations.  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 She has been relatively healthy throughout her life with no significant chronic medical conditions.  She quit smoking in 2006.  She does have family history of coronary artery disease. She went to the emergency room in January of this year with atypical chest pain with dizziness and presyncope.  EKG was unremarkable.  CTA of the chest showed incidental small ascending aortic aneurysm at 4 cm.  Troponin was negative.  She was having palpitations at that time described as skipped beats with associated dizziness. Since that time, her symptoms resolved completely.  Her blood pressure occasionally runs high. She tries to exercise regularly and has no exertional symptoms other than mild dyspnea.  No chest pain.  She has not had any palpitations in the last month.  She does not consume excessive amount of caffeine and usually she drinks 1 cup of coffee daily.  She had her thyroid function checked last year and it was unremarkable.  Past Medical History:  Diagnosis Date  . AAA (abdominal aortic aneurysm) ([place])   . Diabetes mellitus without complication ([place])    Prediabetes 04/29/17  . Thyroid disease    pt states no history of thyroid disease 04/29/17    Past Surgical History:  Procedure Laterality Date  . BREAST REDUCTION SURGERY  1998  . lipoma removal  2007   back     Current Outpatient Medications  Medication Sig Dispense Refill  . albuterol (PROVENTIL HFA;VENTOLIN HFA) 108 (90 Base) MCG/ACT inhaler  Inhale 2 puffs into the lungs every 6 (six) hours as needed for wheezing or shortness of breath. 1 Inhaler 0  . fluticasone (FLONASE) 50 MCG/ACT nasal spray Place 2 sprays into both nostrils daily. (Patient taking differently: Place 2 sprays into both nostrils as needed. ) 16 g 6   No current facility-administered medications for this visit.     Allergies:   Codeine    Social History:  The patient  reports that she quit smoking about 13 years ago. She has a 25.00 pack-year smoking history. she has never used smokeless tobacco. She reports that she drinks alcohol. She reports that she does not use drugs.   Family History:  The patient's family history includes Cancer in her father, paternal grandfather, and paternal grandmother; Diabetes in her father and mother; Heart attack in her father; Heart disease in her father, paternal grandfather, and paternal grandmother.    ROS:  Please see the history of present illness.   Otherwise, review of systems are positive for none.   All other systems are reviewed and negative.    PHYSICAL EXAM: VS:  BP 118/78 (BP Location: Right Arm, Patient Position: Sitting, Cuff Size: Normal)   Pulse 72   Ht 5\' 3"  (1.6 m)   Wt 180 lb 8 oz (81.9 kg)   LMP 09/11/2016   BMI 31.97 kg/m  , BMI Body mass index is 31.97 kg/m. GEN: Well nourished, well developed, in no acute distress  HEENT:  normal  Neck: no JVD, carotid bruits, or masses Cardiac: RRR; no murmurs, rubs, or gallops,no edema  Respiratory:  clear to auscultation bilaterally, normal work of breathing GI: soft, nontender, nondistended, + BS MS: no deformity or atrophy  Skin: warm and dry, no rash Neuro:  Strength and sensation are intact Psych: euthymic mood, full affect   EKG:  EKG is ordered today. The ekg ordered today demonstrates normal sinus rhythm with low voltage and no significant ST or T wave changes.   Recent Labs: 04/29/2017: BUN 16; Creatinine, Ser 0.66; Hemoglobin 14.5; Platelets  248; Potassium 3.6; Sodium 133    Lipid Panel No results found for: CHOL, TRIG, HDL, CHOLHDL, VLDL, LDLCALC, LDLDIRECT    Wt Readings from Last 3 Encounters:  06/29/17 180 lb 8 oz (81.9 kg)  06/05/17 172 lb (78 kg)  05/04/17 173 lb (78.5 kg)       No flowsheet data found.    ASSESSMENT AND PLAN:  1.  Palpitations: Likely due to premature beats.  Her symptoms resolved in the last month and thus there is low yield for monitoring.  I discussed with her the option of purchasing a smart phone monitor or if her symptoms worsen she can call us to obtain a 48-hour Holter monitor.  2.  Exertional dyspnea: Overall mild with no chest pain.  I requested an echocardiogram to ensure no structural heart abnormalities.  3.  Small ascending aortic aneurysm: She is going to see Dr. Cyndia Bent.  Echocardiogram will help evaluate aortic root and aortic valve structure.    Disposition:   FU with me as needed.   Signed,  Kathlyn Sacramento, MD  06/29/2017 12:06 PM    [place]

## 2017-07-02 ENCOUNTER — Encounter: Payer: Self-pay | Admitting: Cardiology

## 2017-07-02 ENCOUNTER — Other Ambulatory Visit: Payer: Self-pay

## 2017-07-02 ENCOUNTER — Ambulatory Visit (INDEPENDENT_AMBULATORY_CARE_PROVIDER_SITE_OTHER): Payer: 59

## 2017-07-02 DIAGNOSIS — R0602 Shortness of breath: Secondary | ICD-10-CM

## 2017-07-02 NOTE — Progress Notes (Signed)
Patient said she has been monitoring her BP, and it always reads higher in the left arm.  I took it (sitting) in left arm and got 150/86, right arm I got 130/80. Used the Doppler, and got triphasic flow in both brachial arteries.

## 2017-07-11 ENCOUNTER — Other Ambulatory Visit: Payer: Self-pay

## 2017-07-11 ENCOUNTER — Institutional Professional Consult (permissible substitution): Payer: 59 | Admitting: Surgery

## 2017-07-11 ENCOUNTER — Encounter: Payer: Self-pay | Admitting: Surgery

## 2017-07-11 VITALS — BP 132/84 | HR 80 | Resp 16 | Ht 63.0 in | Wt 176.0 lb

## 2017-07-11 DIAGNOSIS — I712 Thoracic aortic aneurysm, without rupture: Secondary | ICD-10-CM

## 2017-07-11 DIAGNOSIS — I7121 Aneurysm of the ascending aorta, without rupture: Secondary | ICD-10-CM

## 2017-07-11 NOTE — Progress Notes (Signed)
PCP is [name], DO Referring Provider is Early, Arvilla Meres, MD  Chief Complaint  Patient presents with  . 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 and presented on 04/30/2017 with complaints of chest pain radiating into her neck and down her left arm with numbness in left arm and palpitations.  She ruled out for myocardial infarction and electrocardiogram was unremarkable.  She had a CT of the chest and neck which showed a 4 cm fusiform ascending aortic aneurysm.  There is a small pericardial effusion.  There is also a 7 mm right thyroid hypodense nodule.  She had a history of thyroid nodules and has been followed with ultrasound in the past and had previous biopsy which was negative for malignancy.  She was evaluated at [facility] by Dr. Lucky Cowboy for the ascending aortic aneurysm and follow-up in 1 year is recommended.  She desired a second surgical opinion and was referred to Dr. Sherren Mocha Early here in [place].  He saw the patient but noted that this was an ascending aortic aneurysm and therefore referred her to me.  She was also evaluated by Dr. Fletcher Anon on 06/29/2017 and had an echocardiogram showing normal left ventricular systolic function with ejection fraction of 60-65%.  The aortic valve was trileaflet with normal leaflets and no restriction of mobility.  There is no stenosis or regurgitation.  The ascending aorta was measured at 39 mm.  She has not had any recurrence of her chest or neck discomfort.  She denies any shortness of breath.  Past Medical History:  Diagnosis Date  . AAA (abdominal aortic aneurysm) ([place])   . Diabetes mellitus without complication ([place])    Prediabetes 04/29/17  . Thyroid disease    pt states no history of thyroid disease 04/29/17    Past Surgical History:  Procedure Laterality Date  . BREAST REDUCTION SURGERY  1998  . lipoma removal   2007   back    Family History  Problem Relation Age of Onset  . Diabetes Mother   . Diabetes Father   . Heart disease Father   . Heart attack Father   . Cancer Father   . Cancer Paternal Grandmother   . Heart disease Paternal Grandmother   . Cancer Paternal Grandfather   . Heart disease Paternal Grandfather     Social History Social History   Tobacco Use  . Smoking status: Former Smoker    Packs/day: 1.00    Years: 25.00    Pack years: 25.00    Last attempt to quit: 04/17/2004    Years since quitting: 13.2  . Smokeless tobacco: Never Used  Substance Use Topics  . Alcohol use: Yes    Alcohol/week: 0.0 oz    Comment: social  . Drug use: No    Comment: past only    Current Outpatient Medications  Medication Sig Dispense Refill  . albuterol (PROVENTIL HFA;VENTOLIN HFA) 108 (90 Base) MCG/ACT inhaler Inhale 2 puffs into the lungs every 6 (six) hours as needed for wheezing or shortness of breath. 1 Inhaler 0  . fluticasone (FLONASE) 50 MCG/ACT nasal spray Place 2 sprays into both nostrils daily. (Patient taking differently: Place 2 sprays into both nostrils as needed. ) 16 g 6   No current facility-administered medications for this visit.     Allergies  Allergen Reactions  . Codeine  Review of Systems  Constitutional: Positive for fatigue. Negative for activity change.  HENT: Negative.   Eyes: Negative.   Respiratory: Negative for shortness of breath.   Cardiovascular: Positive for chest pain and palpitations.  Gastrointestinal: Negative.   Endocrine: Negative.   Genitourinary: Negative.   Musculoskeletal: Positive for arthralgias.  Skin: Negative.   Allergic/Immunologic: Negative.   Neurological: Negative.   Hematological: Negative.   Psychiatric/Behavioral: Negative.     BP 132/84 (BP Location: Left Arm, Patient Position: Sitting, Cuff Size: Large)   Pulse 80   Resp 16   Ht 5\' 3"  (1.6 m)   Wt 176 lb (79.8 kg)   LMP 09/11/2016   SpO2 98%   BMI 31.18  kg/m  Physical Exam  Constitutional: She is oriented to person, place, and time. She appears well-developed and well-nourished. No distress.  HENT:  Head: Normocephalic and atraumatic.  Eyes: Pupils are equal, round, and reactive to light. EOM are normal.  Neck: No thyromegaly present.  Cardiovascular: Normal rate, regular rhythm and normal heart sounds.  No murmur heard. Pulmonary/Chest: Effort normal and breath sounds normal. No respiratory distress. She has no wheezes.  Lymphadenopathy:    She has no cervical adenopathy.  Neurological: She is alert and oriented to person, place, and time.  Psychiatric: She has a normal mood and affect.     Diagnostic Tests:  CLINICAL DATA:  52 year old female with chest and back pain.  EXAM: CT ANGIOGRAPHY CHEST WITH CONTRAST  TECHNIQUE: Multidetector CT imaging of the chest was performed using the standard protocol during bolus administration of intravenous contrast. Multiplanar CT image reconstructions and MIPs were obtained to evaluate the vascular anatomy.  CONTRAST:  130mL ISOVUE-370 IOPAMIDOL (ISOVUE-370) INJECTION 76%  COMPARISON:  Chest radiograph dated 04/29/2017  FINDINGS: Cardiovascular: There is no cardiomegaly. Small pericardial effusion measures up to 8 mm in thickness. The thoracic aorta is unremarkable. The origins of the great vessels of the aortic arch are patent. Evaluation of the pulmonary arteries is very limited due to suboptimal enhancement and timing of the contrast. No definite large or central pulmonary artery embolus identified.  Mediastinum/Nodes: There is no hilar or mediastinal adenopathy. The esophagus is grossly unremarkable. There is a 7 mm hypodense right thyroid nodule. Further evaluation with ultrasound recommended. No mediastinal fluid collection.  Lungs/Pleura: Linear bilateral atelectasis/scarring. The lungs are clear. There is no pleural effusion or pneumothorax. The central airways  are patent  Upper Abdomen: Diffuse fatty infiltration of the liver. Small foci of enhancement in the spleen are not well characterized but may represent a flash filling hemangioma. The visualized upper abdomen is otherwise unremarkable.  Musculoskeletal: Mild degenerative changes of the spine. No acute osseous pathology there is a pectus carinatum deformity.  Review of the MIP images confirms the above findings.  IMPRESSION: 1. Very limited almost nondiagnostic evaluation of the pulmonary arteries. No definite central or large pulmonary artery embolus identified. 2. Small pericardial effusion. 3. Mild fatty liver. 4. A 7 mm right thyroid hypodense nodule. Further evaluation with ultrasound recommended.   Electronically Signed   By: Anner Crete M.D.   On: 04/29/2017 22:04  CLINICAL DATA:  Intermittent chest pain, LEFT arm tingling and numbness tonight. Palpitations for 2 weeks. Severe headache. Possible panic attack.  EXAM: CT ANGIOGRAPHY HEAD AND NECK  TECHNIQUE: Multidetector CT imaging of the head and neck was performed using the standard protocol during bolus administration of intravenous contrast. Multiplanar CT image reconstructions and MIPs were obtained to evaluate the vascular anatomy. Carotid  stenosis measurements (when applicable) are obtained utilizing NASCET criteria, using the distal internal carotid diameter as the denominator.  CONTRAST:  124mL ISOVUE-370 IOPAMIDOL (ISOVUE-370) INJECTION 76%  COMPARISON:  None.  FINDINGS: CT HEAD FINDINGS  BRAIN: No intraparenchymal hemorrhage, mass effect nor midline shift. The ventricles and sulci are normal. No acute large vascular territory infarcts. No abnormal extra-axial fluid collections. Basal cisterns are patent. 6 mm pineal cyst.  VASCULAR: Unremarkable.  SKULL/SOFT TISSUES: No skull fracture. No significant soft tissue swelling.  ORBITS/SINUSES: The included ocular globes and  orbital contents are normal.The mastoid aircells and included paranasal sinuses are well-aerated.  OTHER: None.  CTA NECK FINDINGS:  AORTIC ARCH: 4 cm ascending aorta, 2 vessel arch is a normal variant. The origins of the innominate, left Common carotid artery and subclavian artery are widely patent.  RIGHT CAROTID SYSTEM: Common carotid artery is widely patent, coursing in a straight line fashion. Normal appearance of the carotid bifurcation without hemodynamically significant stenosis by NASCET criteria. Normal appearance of the internal carotid artery.  LEFT CAROTID SYSTEM: Common carotid artery is widely patent, coursing in a straight line fashion. Trace calcific atherosclerosis carotid bifurcation without hemodynamically significant stenosis by NASCET criteria. Normal appearance of the internal carotid artery.  VERTEBRAL ARTERIES:Left vertebral artery is dominant. Normal appearance of the vertebral arteries, widely patent.  SKELETON: No acute osseous process though bone windows have not been submitted. Severe C5-6 disc height loss with endplate sclerosis and marginal spurring compatible with degenerative disc. Moderate bilateral C5-6 neural foraminal narrowing.  OTHER NECK: Soft tissues of the neck are nonacute though, not tailored for evaluation. Subcentimeter thyroid nodules, no routine follow-up by consensus.  UPPER CHEST: Included lung apices are clear. No superior mediastinal lymphadenopathy.  CTA HEAD FINDINGS:  ANTERIOR CIRCULATION: Patent cervical internal carotid arteries, petrous, cavernous and supra clinoid internal carotid arteries. Patent anterior communicating artery. Patent anterior and middle cerebral arteries.  No large vessel occlusion, significant stenosis, contrast extravasation or aneurysm.  POSTERIOR CIRCULATION: Patent vertebral arteries, vertebrobasilar junction and basilar artery, as well as main branch vessels.  Patent posterior cerebral arteries. Small bilateral posterior communicating arteries present.  No large vessel occlusion, significant stenosis, contrast extravasation or aneurysm.  VENOUS SINUSES: Major dural venous sinuses are patent though not tailored for evaluation on this angiographic examination.  ANATOMIC VARIANTS: Supernumerary anterior cerebral artery arising from RIGHT A1-2 junction.  DELAYED PHASE: No abnormal intracranial enhancement.  MIP images reviewed.  IMPRESSION: CT HEAD:  1. Negative CT HEAD with and without contrast.  CTA NECK:  1. No hemodynamically significant stenosis or acute vascular process. 2. **An incidental finding of potential clinical significance has been found. 4 cm aneurysmal ascending aorta. Recommend annual imaging followup by CTA or MRA. This recommendation follows 2010 ACCF/AHA/AATS/ACR/ASA/SCA/SCAI/SIR/STS/SVM Guidelines for the Diagnosis and Management of Patients with Thoracic Aortic Disease. Circulation. 2010; 121: L845-X646** 3. Moderate bilateral C5-6 neural foraminal narrowing.  CTA HEAD:  1. Negative CTA head.   Electronically Signed   By: Elon Alas M.D.   On: 04/29/2017 22:03     *[address]                        [place], [place] [address]  [phone]  ------------------------------------------------------------------- Transthoracic Echocardiography  Patient:    Ryiah, Bellissimo MR #:       [id number] Study Date: 07/02/2017 Gender:     F Age:        45 Height:     160 cm Weight:     81.9 kg BSA:        1.94 m^2 Pt. Status: Room:   ATTENDING    Default, Provider [phone]  [place], RVT, RDCS  ORDERING     Kathlyn Sacramento, MD  [place], MD  PERFORMING   [facility], [place]  cc:  ------------------------------------------------------------------- LV EF: 60% -    65%  ------------------------------------------------------------------- History:   PMH:   Chest pain.  Palpitations, lightheadedness, presyncope, and dyspnea.  Risk factors:  Elevated BP without diagnosis of HTN. Former tobacco use. Diabetes mellitus.  ------------------------------------------------------------------- Study Conclusions  - Left ventricle: The cavity size was normal. There was mild   concentric hypertrophy. Systolic function was normal. The   estimated ejection fraction was in the range of 60% to 65%. Wall   motion was normal; there were no regional wall motion   abnormalities. Left ventricular diastolic function parameters   were normal. - Aorta: Ascending aortic diameter: 39 mm (S). - Mitral valve: There was mild regurgitation.  ------------------------------------------------------------------- Study data:  The previous study was not available, so comparison was made to the report of 03/10/2011. Previous Echo showed LV EF 65%, no RWMA, aortic root 28 mm.  CT chest in 05-2017 showed 4 cm ascending aorta.  Study status: Routine.  Procedure:  Transthoracic echocardiography. Image quality was adequate.  Study completion:  There were no complications.     Transthoracic echocardiography.  M-mode, complete 2D, spectral Doppler, and color Doppler.  Birthdate:  Patient birthdate: [birth year].  Age:  Patient is 52 yr old.  Sex:  Gender: female. BMI: 32 kg/m^2.  Blood pressure:     150/86  Patient status: Outpatient.  Study date:  Study date: 07/02/2017. Study time: 11:31 AM.  -------------------------------------------------------------------  ------------------------------------------------------------------- Left ventricle:  The cavity size was normal. There was mild concentric hypertrophy. Systolic function was normal. The estimated ejection fraction was in the range of 60% to 65%. Wall motion was normal; there were no regional wall motion abnormalities.  The transmitral flow pattern was normal. The deceleration time of the early transmitral flow velocity was normal. The pulmonary vein flow pattern was normal. The tissue Doppler parameters were normal. Left ventricular diastolic function parameters were normal.  ------------------------------------------------------------------- Aortic valve:   Trileaflet; normal thickness leaflets. Mobility was not restricted.  Doppler:  Transvalvular velocity was within the normal range. There was no stenosis. There was no regurgitation. Mean gradient (S): 3 mm Hg.  ------------------------------------------------------------------- Aorta:  Aortic root: The aortic root was normal in size.  ------------------------------------------------------------------- Mitral valve:   Structurally normal valve.   Mobility was not restricted.  Doppler:  Transvalvular velocity was within the normal range. There was no evidence for stenosis. There was mild regurgitation.    Indexed valve area by pressure half-time: 0.78 cm^2/m^2.    Peak gradient (D): 4 mm Hg.  ------------------------------------------------------------------- Left atrium:  The atrium was normal in size.  ------------------------------------------------------------------- Right ventricle:  The cavity size was normal. Wall thickness was normal. Systolic function was normal.  ------------------------------------------------------------------- Pulmonic valve:    Doppler:  Transvalvular velocity was within the normal range. There was no evidence for stenosis.  ------------------------------------------------------------------- Tricuspid valve:   Structurally normal valve.  Doppler: Transvalvular velocity was within the normal range. There was trivial regurgitation.  ------------------------------------------------------------------- Pulmonary artery:   The main pulmonary artery was normal-sized. Systolic pressure was within the normal  range.  ------------------------------------------------------------------- Right atrium:  The atrium was normal in size.  ------------------------------------------------------------------- Pericardium:  There was no pericardial effusion.  ------------------------------------------------------------------- Systemic veins: Inferior vena cava: The vessel was normal in size.  ------------------------------------------------------------------- Measurements   Left ventricle                           Value          Reference  LV ID, ED, PLAX chordal          (L)     40    mm       43 - 52  LV ID, ES, PLAX chordal                  27    mm       23 - 38  LV fx shortening, PLAX chordal           33    %        >=29  LV PW thickness, ED                      11    mm       ----------  IVS/LV PW ratio, ED                      1.09           <=1.3  LV ejection fraction, 1-p A4C            63    %        ----------  LV end-diastolic volume, 2-p             48    ml       ----------  LV end-systolic volume, 2-p              14    ml       ----------  LV ejection fraction, 2-p                70    %        ----------  Stroke volume, 2-p                       34    ml       ----------  LV end-diastolic volume/bsa, 2-p         25    ml/m^2   ----------  LV end-systolic volume/bsa, 2-p          7     ml/m^2   ----------  Stroke volume/bsa, 2-p                   17.4  ml/m^2   ----------  LV e&', lateral                           10.3  cm/s     ----------  LV E/e&', lateral                         9.9            ----------  LV e&', medial  7.18  cm/s     ----------  LV E/e&', medial                          14.21          ----------  LV e&', average                           8.74  cm/s     ----------  LV E/e&', average                         11.67          ----------    Ventricular septum                       Value          Reference  IVS thickness, ED                         12    mm       ----------    LVOT                                     Value          Reference  LVOT ID, S                               21    mm       ----------  LVOT area                                3.46  cm^2     ----------    Aortic valve                             Value          Reference  Aortic valve mean velocity, S            87.6  cm/s     ----------  Aortic valve VTI, S                      27.2  cm       ----------  Aortic mean gradient, S                  3     mm Hg    ----------    Aorta                                    Value          Reference  Aortic root ID, ED                       30    mm       ----------  Ascending aorta ID, A-P, S               39    mm       ----------  Left atrium                              Value          Reference  LA ID, A-P, ES                           35    mm       ----------  LA ID/bsa, A-P                           1.81  cm/m^2   <=2.2  LA volume, S                             36.6  ml       ----------  LA volume/bsa, S                         18.9  ml/m^2   ----------  LA volume, ES, 1-p A4C                   40.1  ml       ----------  LA volume/bsa, ES, 1-p A4C               20.7  ml/m^2   ----------  LA volume, ES, 1-p A2C                   32.6  ml       ----------  LA volume/bsa, ES, 1-p A2C               16.8  ml/m^2   ----------    Mitral valve                             Value          Reference  Mitral E-wave peak velocity              102   cm/s     ----------  Mitral A-wave peak velocity              79.8  cm/s     ----------  Mitral deceleration time         (L)     141   ms       150 - 230  Mitral pressure half-time                156   ms       ----------  Mitral peak gradient, D                  4     mm Hg    ----------  Mitral E/A ratio, peak                   1.2            ----------  Mitral valve area/bsa, PHT, DP           0.78  cm^2/m^2 ----------    Tricuspid valve                           Value  Reference  Tricuspid peak RV-RA gradient            24    mm Hg    ----------    Right atrium                             Value          Reference  RA ID, S-I, ES, A4C                      44.8  mm       34 - 49  RA area, ES, A4C                         11.6  cm^2     8.3 - 19.5  RA volume, ES, A/L                       24.2  ml       ----------  RA volume/bsa, ES, A/L                   12.5  ml/m^2   ----------    Right ventricle                          Value          Reference  TAPSE                                    33.7  mm       ----------  RV s&', lateral, S                        13.6  cm/s     ----------    Pulmonic valve                           Value          Reference  Pulmonic valve peak velocity, S          109   cm/s     ----------  Legend: (L)  and  (H)  mark values outside specified reference range.  ------------------------------------------------------------------- Prepared and Electronically Authenticated by  Kathlyn Sacramento, MD 2019-03-18T18:05:14   Impression:  This 52 year old woman has a incidental 4.0 cm fusiform ascending aortic aneurysm that I do not think was responsible for her presenting symptoms of chest and neck pain.  This will require continued follow-up to establish stability.  I reviewed the CT images with her today and answered all of her questions.  She said that she had been previously lifting heavy weights at the gym but I advised her against lifting anything heavier than about 35 pounds to avoid the elevation of blood pressure that can occur with a significant Valsalva maneuver.  I also advised her against taking quinolone antibiotics which have been associated with developing aortic aneurysms.  Plan:  I will plan to see her back in 1 year with a CTA of the chest.   I spent 45 minutes performing this consultation and > 50% of this time was spent face to face counseling and coordinating the care of this patient's ascending  aortic aneurysm.  Gaye Pollack,  MD [facility] [phone]

## 2017-07-15 DIAGNOSIS — I340 Nonrheumatic mitral (valve) insufficiency: Secondary | ICD-10-CM | POA: Insufficient documentation

## 2017-07-15 DIAGNOSIS — Z923 Personal history of irradiation: Secondary | ICD-10-CM | POA: Insufficient documentation

## 2017-07-15 DIAGNOSIS — E042 Nontoxic multinodular goiter: Secondary | ICD-10-CM | POA: Insufficient documentation
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
## 2017-07-15 NOTE — Assessment & Plan Note (Addendum)
Saw endocrine in June 2018- at that time FNA was not recommended. They recommended repeating thyroid US in June 2019. Does not want to follow up with current endocrinologist- will get repeat US in 3 months.

## 2017-07-15 NOTE — Assessment & Plan Note (Signed)
A1c of 5.7 in 2017- glucose 133 in January. Will recheck A1c today. Await results.

## 2017-07-16 ENCOUNTER — Encounter: Payer: Self-pay | Admitting: Family Medicine

## 2017-07-16 ENCOUNTER — Ambulatory Visit: Payer: 59 | Admitting: Family Medicine

## 2017-07-16 VITALS — BP 125/84 | HR 79 | Temp 98.5°F | Ht 63.3 in | Wt 175.4 lb

## 2017-07-16 DIAGNOSIS — E042 Nontoxic multinodular goiter: Secondary | ICD-10-CM

## 2017-07-16 DIAGNOSIS — I340 Nonrheumatic mitral (valve) insufficiency: Secondary | ICD-10-CM

## 2017-07-16 DIAGNOSIS — Z923 Personal history of irradiation: Secondary | ICD-10-CM

## 2017-07-16 DIAGNOSIS — I7121 Aneurysm of the ascending aorta, without rupture: Secondary | ICD-10-CM

## 2017-07-16 DIAGNOSIS — R7301 Impaired fasting glucose: Secondary | ICD-10-CM

## 2017-07-16 DIAGNOSIS — H5111 Convergence insufficiency: Secondary | ICD-10-CM

## 2017-07-16 DIAGNOSIS — Z8639 Personal history of other endocrine, nutritional and metabolic disease: Secondary | ICD-10-CM | POA: Diagnosis not present

## 2017-07-16 DIAGNOSIS — I712 Thoracic aortic aneurysm, without rupture: Secondary | ICD-10-CM

## 2017-07-16 DIAGNOSIS — Z13220 Encounter for screening for lipoid disorders: Secondary | ICD-10-CM

## 2017-07-16 DIAGNOSIS — R030 Elevated blood-pressure reading, without diagnosis of hypertension: Secondary | ICD-10-CM

## 2017-07-16 DIAGNOSIS — R946 Abnormal results of thyroid function studies: Secondary | ICD-10-CM | POA: Diagnosis not present

## 2017-07-16 DIAGNOSIS — R41840 Attention and concentration deficit: Secondary | ICD-10-CM | POA: Diagnosis not present

## 2017-07-16 DIAGNOSIS — R42 Dizziness and giddiness: Secondary | ICD-10-CM | POA: Insufficient documentation

## 2017-07-16 NOTE — Assessment & Plan Note (Signed)
4.0 cm dilatation of her ascending aorta, found in ER January 2018. Has been following with Dr. Lucky Cowboy. To have repeat CT scan done in 1 year. BP under good control today. Continue to monitor.

## 2017-07-16 NOTE — Assessment & Plan Note (Signed)
Under good control today. Keep journal and bring it in next visit. Continue to monitor.

## 2017-07-16 NOTE — Assessment & Plan Note (Signed)
Stable. Continue to monitor. Call with any concerns. Continue to follow with cardiology.

## 2017-07-16 NOTE — Assessment & Plan Note (Signed)
Of unclear etiology. Has seen ophthalmology without issues. Eyes do not accommodate. Will check labs, will refer to neurology. Await results and their input. Call with any concerns.

## 2017-07-16 NOTE — Assessment & Plan Note (Signed)
Not currently. Continue to monitor.

## 2017-07-16 NOTE — Assessment & Plan Note (Signed)
Continue to monitor. Call with any concerns.  

## 2017-07-17 ENCOUNTER — Encounter: Payer: Self-pay | Admitting: Psychology

## 2017-07-17 ENCOUNTER — Encounter: Payer: Self-pay | Admitting: Neurology

## 2017-07-17 LAB — CBC WITH DIFFERENTIAL/PLATELET
Basophils Absolute: 0 10*3/uL (ref 0.0–0.2)
Basos: 0 %
EOS (ABSOLUTE): 0.1 10*3/uL (ref 0.0–0.4)
Eos: 1 %
Hematocrit: 42.1 % (ref 34.0–46.6)
Hemoglobin: 14.3 g/dL (ref 11.1–15.9)
Immature Grans (Abs): 0 10*3/uL (ref 0.0–0.1)
Immature Granulocytes: 0 %
Lymphocytes Absolute: 1.7 10*3/uL (ref 0.7–3.1)
Lymphs: 23 %
MCH: 30.2 pg (ref 26.6–33.0)
MCHC: 34 g/dL (ref 31.5–35.7)
MCV: 89 fL (ref 79–97)
Monocytes Absolute: 0.4 10*3/uL (ref 0.1–0.9)
Monocytes: 6 %
Neutrophils Absolute: 5.1 10*3/uL (ref 1.4–7.0)
Neutrophils: 70 %
Platelets: 269 10*3/uL (ref 150–379)
RBC: 4.74 x10E6/uL (ref 3.77–5.28)
RDW: 13.5 % (ref 12.3–15.4)
WBC: 7.3 10*3/uL (ref 3.4–10.8)

## 2017-07-17 LAB — B12 AND FOLATE PANEL
Folate: >20 ng/mL (ref >3.0)
Vitamin B-12: 1065 pg/mL (ref 232–1245)

## 2017-07-17 LAB — LIPID PANEL W/O CHOL/HDL RATIO
Cholesterol, Total: 205 mg/dL — ABNORMAL HIGH (ref 100–199)
HDL: 50 mg/dL (ref >39)
LDL Calculated: 130 mg/dL — ABNORMAL HIGH (ref 0–99)
Triglycerides: 123 mg/dL (ref 0–149)
VLDL Cholesterol Cal: 25 mg/dL (ref 5–40)

## 2017-07-17 LAB — BAYER DCA HB A1C WAIVED: HB A1C (BAYER DCA - WAIVED): 5 % (ref <7.0)

## 2017-07-17 LAB — RPR: RPR Ser Ql: NONREACTIVE

## 2017-07-17 LAB — COMPREHENSIVE METABOLIC PANEL
ALT: 17 IU/L (ref 0–32)
AST: 23 IU/L (ref 0–40)
Albumin/Globulin Ratio: 2.1 (ref 1.2–2.2)
Albumin: 4.6 g/dL (ref 3.5–5.5)
Alkaline Phosphatase: 78 IU/L (ref 39–117)
BUN/Creatinine Ratio: 20 (ref 9–23)
BUN: 12 mg/dL (ref 6–24)
Bilirubin Total: 0.5 mg/dL (ref 0.0–1.2)
CO2: 21 mmol/L (ref 20–29)
Calcium: 9.9 mg/dL (ref 8.7–10.2)
Chloride: 96 mmol/L (ref 96–106)
Creatinine, Ser: 0.61 mg/dL (ref 0.57–1.00)
GFR calc Af Amer: 123 mL/min/{1.73_m2} (ref >59)
GFR calc non Af Amer: 107 mL/min/{1.73_m2} (ref >59)
Globulin, Total: 2.2 g/dL (ref 1.5–4.5)
Glucose: 77 mg/dL (ref 65–99)
Potassium: 4.3 mmol/L (ref 3.5–5.2)
Sodium: 138 mmol/L (ref 134–144)
Total Protein: 6.8 g/dL (ref 6.0–8.5)

## 2017-07-17 LAB — UA/M W/RFLX CULTURE, ROUTINE
Bilirubin, UA: NEGATIVE
Glucose, UA: NEGATIVE
Leukocytes, UA: NEGATIVE
Nitrite, UA: NEGATIVE
Protein, UA: NEGATIVE
Specific Gravity, UA: <1.005 — ABNORMAL LOW (ref 1.005–1.030)
Urobilinogen, Ur: 0.2 mg/dL (ref 0.2–1.0)
pH, UA: 5.5 (ref 5.0–7.5)

## 2017-07-17 LAB — MICROSCOPIC EXAMINATION
Bacteria, UA: NONE SEEN
WBC, UA: NONE SEEN /hpf (ref 0–5)

## 2017-07-17 LAB — MICROALBUMIN, URINE WAIVED
Creatinine, Urine Waived: 10 mg/dL (ref 10–300)
Microalb, Ur Waived: 10 mg/L (ref 0–19)
Microalb/Creat Ratio: <30 mg/g (ref <30)

## 2017-07-17 LAB — THYROID PANEL WITH TSH
Free Thyroxine Index: 1.9 (ref 1.2–4.9)
T3 Uptake Ratio: 22 % — ABNORMAL LOW (ref 24–39)
T4, Total: 8.7 ug/dL (ref 4.5–12.0)
TSH: 1.03 u[IU]/mL (ref 0.450–4.500)

## 2017-07-18 ENCOUNTER — Encounter: Payer: Self-pay | Admitting: Family Medicine

## 2017-07-30 DIAGNOSIS — H524 Presbyopia: Secondary | ICD-10-CM | POA: Diagnosis not present

## 2017-08-01 ENCOUNTER — Encounter: Payer: Self-pay | Admitting: Family Medicine

## 2017-08-02 NOTE — Telephone Encounter (Signed)
Called [facility], no answer, will try again.

## 2017-08-03 NOTE — Telephone Encounter (Signed)
Patient notified, I will continue to try Neurology.

## 2017-08-03 NOTE — Telephone Encounter (Signed)
Please let patient know that we are trying to get her in with neurology sooner, but they are not open today- so it will have to be early next week. If she is having seizures, she may need to go to the ER, otherwise we will get back to her next week when we get in touch with the neurologist.

## 2017-08-03 NOTE — Telephone Encounter (Signed)
Patient called the [facility] again at 12:30 today stating that she is concerned that she is continuing to have seizures.  Please call patient as soon as possible to advise.

## 2017-08-13 ENCOUNTER — Encounter: Payer: Self-pay | Admitting: Neurology

## 2017-08-21 ENCOUNTER — Ambulatory Visit: Payer: 59 | Admitting: Family Medicine

## 2017-08-22 ENCOUNTER — Ambulatory Visit (INDEPENDENT_AMBULATORY_CARE_PROVIDER_SITE_OTHER): Payer: Self-pay | Admitting: Family Medicine

## 2017-08-22 VITALS — BP 125/85 | HR 65 | Temp 98.0°F | Resp 16 | Wt 174.5 lb

## 2017-08-22 DIAGNOSIS — N3001 Acute cystitis with hematuria: Secondary | ICD-10-CM

## 2017-08-22 LAB — POCT URINALYSIS DIPSTICK
Bilirubin, UA: NEGATIVE
Glucose, UA: NEGATIVE
Ketones, UA: NEGATIVE
Nitrite, UA: POSITIVE
Spec Grav, UA: 1.01 (ref 1.010–1.025)
Urobilinogen, UA: 0.2 E.U./dL
pH, UA: 6.5 (ref 5.0–8.0)

## 2017-08-22 MED ORDER — PHENAZOPYRIDINE HCL 100 MG PO TABS: 100.0000 mg | ORAL_TABLET | Freq: Three times a day (TID) | ORAL | 0 refills | Status: DC | PRN

## 2017-08-22 MED ORDER — CEPHALEXIN 500 MG PO CAPS: 500.0000 mg | ORAL_CAPSULE | Freq: Two times a day (BID) | ORAL | 0 refills | Status: AC

## 2017-08-22 NOTE — Progress Notes (Signed)
Patient ID: Brittany Ochoa, female    DOB: July 10, 1965, 52 y.o.   MRN: [id number]  PCP: [name], DO  Chief Complaint  Patient presents with  . Dysuria    Started today, Saw blood in her urine  . Back Pain    Lower back pain, Started today    Subjective:  HPI Brittany Ochoa is a 52 y.o. female presents for evaluation UTI like symptoms which have been occurring intermittently over the last 1 week. Approximately 5 days ago she began to notice that her urine was becoming cloudy and she experienced some urine frequency. Today she reports worsening burning with urination, visible blood in urine, and lower abdominal pressure. Denies fever, chills, nausea, or vomiting. Social History   Socioeconomic History  . Marital status: Single    Spouse name: Not on file  . Number of children: Not on file  . Years of education: Not on file  . Highest education level: Not on file  Occupational History  . Not on file  Social Needs  . Financial resource strain: Not on file  . Food insecurity:    Worry: Not on file    Inability: Not on file  . Transportation needs:    Medical: Not on file    Non-medical: Not on file  Tobacco Use  . Smoking status: Former Smoker    Packs/day: 1.00    Years: 25.00    Pack years: 25.00    Last attempt to quit: 04/17/2004    Years since quitting: 13.3  . Smokeless tobacco: Never Used  Substance and Sexual Activity  . Alcohol use: Yes    Alcohol/week: 0.0 oz    Comment: social  . Drug use: No    Comment: past only  . Sexual activity: Not Currently  Lifestyle  . Physical activity:    Days per week: Not on file    Minutes per session: Not on file  . Stress: Not on file  Relationships  . Social connections:    Talks on phone: Not on file    Gets together: Not on file    Attends religious service: Not on file    Active member of club or organization: Not on file    Attends meetings of clubs or organizations: Not on file    Relationship  status: Not on file  . Intimate partner violence:    Fear of current or ex partner: Not on file    Emotionally abused: Not on file    Physically abused: Not on file    Forced sexual activity: Not on file  Other Topics Concern  . Not on file  Social History Narrative  . Not on file    Family History  Problem Relation Age of Onset  . Diabetes Mother   . Diabetes Father   . Heart disease Father   . Heart attack Father   . Cancer Father   . Cancer Paternal Grandmother   . Heart disease Paternal Grandmother   . Cancer Paternal Grandfather   . Heart disease Paternal Grandfather   . Osteoporosis Maternal Grandmother    Review of Systems  Pertinent negatives listed in HPI  Patient Active Problem List   Diagnosis Date Noted  . Dizziness 07/16/2017  . Multiple thyroid nodules 07/15/2017  . 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 Mitral regurgitation 07/15/2017  . History of radiation exposure 07/15/2017  . Impaired fasting glucose 05/04/2017  . Elevated BP without diagnosis of hypertension 05/04/2017  .  Ascending aortic aneurysm ([place]) 05/04/2017    Allergies  Allergen Reactions  . Codeine     Prior to Admission medications   Medication Sig Start Date End Date Taking? Authorizing Provider  albuterol (PROVENTIL HFA;VENTOLIN HFA) 108 (90 Base) MCG/ACT inhaler Inhale 2 puffs into the lungs every 6 (six) hours as needed for wheezing or shortness of breath. Patient not taking: Reported on 07/16/2017 04/06/16   Versie Starks, PA-C  fluticasone [facility]) 50 MCG/ACT nasal spray Place 2 sprays into both nostrils daily. Patient not taking: Reported on 07/16/2017 11/30/15   Versie Starks, PA-C    Past Medical, Surgical Family and Social History reviewed and updated.    Objective:   Today's Vitals   08/22/17 1221  BP: 125/85  Pulse: 65  Resp: 16  Temp: 98 F (36.7 C)  TempSrc: Oral  SpO2: 99%  Weight: 174 lb 8 oz (79.2 kg)    Wt Readings from Last 3 Encounters:  08/22/17  174 lb 8 oz (79.2 kg)  07/16/17 175 lb 7 oz (79.6 kg)  07/11/17 176 lb (79.8 kg)   Physical Exam  Constitutional: She is oriented to person, place, and time. She appears well-developed and well-nourished.  Cardiovascular: Normal rate, regular rhythm, normal heart sounds and intact distal pulses.  Pulmonary/Chest: Effort normal and breath sounds normal.  Abdominal: There is tenderness in the suprapubic area. There is no CVA tenderness.  Neurological: She is alert and oriented to person, place, and time.  Psychiatric: She has a normal mood and affect. Her behavior is normal. Judgment and thought content normal.   Assessment & Plan:  1. Acute cystitis with hematuria, questionable for early signs of pyelonephritis given gross hematuria and flank pain. Will treat empirically with Keflex 500 mg twice daily x 7 days.  Urine culture unavailable at clinic. Patient give strict follow-up instructions if symptoms do not improve within the next 48-72 hours. Also educated regarding worrisome symptoms that would warrant immediate follow-up at an emergency department of urgent care.  If symptoms worsen or do not improve, return for follow-up, follow-up with PCP, or at the emergency department if severity of symptoms warrant a higher level of care.     Carroll Sage. Kenton Kingfisher, MSN, FNP-C [facility]  [address] [place], [place] [address] [phone]

## 2017-08-22 NOTE — Patient Instructions (Signed)
  Follow-up with PCP for urine culture and repeat urinalysis at least 2 days after completion of antibiotic to ensure infection as resolved.   Take all medication as prescribed.    Urinary Tract Infection, Adult A urinary tract infection (UTI) is an infection of any part of the urinary tract. The urinary tract includes the:  Kidneys.  Ureters.  Bladder.  Urethra.  These organs make, store, and get rid of pee (urine) in the body. Follow these instructions at home:  Take over-the-counter and prescription medicines only as told by your doctor.  If you were prescribed an antibiotic medicine, take it as told by your doctor. Do not stop taking the antibiotic even if you start to feel better.  Avoid the following drinks: ? Alcohol. ? Caffeine. ? Tea. ? Carbonated drinks.  Drink enough fluid to keep your pee clear or pale yellow.  Keep all follow-up visits as told by your doctor. This is important.  Make sure to: ? Empty your bladder often and completely. Do not to hold pee for long periods of time. ? Empty your bladder before and after sex. ? Wipe from front to back after a bowel movement if you are female. Use each tissue one time when you wipe. Contact a doctor if:  You have back pain.  You have a fever.  You feel sick to your stomach (nauseous).  You throw up (vomit).  Your symptoms do not get better after 3 days.  Your symptoms go away and then come back. Get help right away if:  You have very bad back pain.  You have very bad lower belly (abdominal) pain.  You are throwing up and cannot keep down any medicines or water. This information is not intended to replace advice given to you by your health care provider. Make sure you discuss any questions you have with your health care provider. Document Released: 09/20/2007 Document Revised: 09/09/2015 Document Reviewed: 02/22/2015 Elsevier Interactive Patient Education  [employer].

## 2017-08-24 ENCOUNTER — Telehealth: Payer: Self-pay | Admitting: Emergency Medicine

## 2017-08-24 NOTE — Telephone Encounter (Signed)
Spoke with patient whom stated that she is doing much better. 

## 2017-08-30 ENCOUNTER — Ambulatory Visit: Payer: 59 | Admitting: Family Medicine

## 2017-09-06 ENCOUNTER — Encounter: Payer: Self-pay | Admitting: Family Medicine

## 2017-09-06 ENCOUNTER — Ambulatory Visit (INDEPENDENT_AMBULATORY_CARE_PROVIDER_SITE_OTHER): Payer: 59 | Admitting: Family Medicine

## 2017-09-06 VITALS — BP 123/89 | HR 81 | Temp 98.6°F | Wt 177.4 lb

## 2017-09-06 DIAGNOSIS — R109 Unspecified abdominal pain: Secondary | ICD-10-CM | POA: Diagnosis not present

## 2017-09-06 DIAGNOSIS — M545 Low back pain, unspecified: Secondary | ICD-10-CM

## 2017-09-06 LAB — UA/M W/RFLX CULTURE, ROUTINE
Bilirubin, UA: NEGATIVE
Glucose, UA: NEGATIVE
Ketones, UA: NEGATIVE
Leukocytes, UA: NEGATIVE
Nitrite, UA: NEGATIVE
Protein, UA: NEGATIVE
RBC, UA: NEGATIVE
Specific Gravity, UA: <1.005 — ABNORMAL LOW (ref 1.005–1.030)
Urobilinogen, Ur: 0.2 mg/dL (ref 0.2–1.0)
pH, UA: 6 (ref 5.0–7.5)

## 2017-09-06 NOTE — Progress Notes (Signed)
BP 123/89 (BP Location: Left Arm, Patient Position: Sitting, Cuff Size: Normal)   Pulse 81   Temp 98.6 F (37 C)   Wt 177 lb 6 oz (80.5 kg)   LMP 09/11/2016   SpO2 96%   BMI 31.12 kg/m    Subjective:    Patient ID: Brittany Ochoa, female    DOB: 1965-11-05, 52 y.o.   MRN: [id number]  HPI: Brittany Ochoa is a 52 y.o. female  Chief Complaint  Patient presents with  . backpain  . recheck UTI   URINARY SYMPTOMS Duration: 4 weeks- [facility] and started on keflex, feeling better from that, but would like her urine checked. Pulled her back yesterday. Dysuria: no Urinary frequency: no Urgency: no Small volume voids: no Symptom severity: mild Urinary incontinence: no Foul odor: no Hematuria: no Abdominal pain: no Back pain: yes Suprapubic pain/pressure: no Flank pain: yes Fever:  no Vomiting: no Relief with cranberry juice: no Relief with pyridium: no Status: better Previous urinary tract infection: yes Recurrent urinary tract infection: no Sexual activity: monogomous History of sexually transmitted disease: no Vaginal discharge: no Treatments attempted: antibiotics and increasing fluids   BACK PAIN Duration: yesterday Mechanism of injury: no trauma Location: bilateral and low back Onset: sudden Severity: moderate Quality: dull, aching and cramping Frequency: constant Radiation: none Aggravating factors: lifting and movement Alleviating factors: rest, ice, heat and NSAIDs Status: fluctuating Treatments attempted: rest, ice, heat, APAP, ibuprofen and aleve  Relief with NSAIDs?: moderate Nighttime pain:  no Paresthesias / decreased sensation:  no Bowel / bladder incontinence:  no Fevers:  no Dysuria / urinary frequency:  no  Relevant past medical, surgical, family and social history reviewed and updated as indicated. Interim medical history since our last visit reviewed. Allergies and medications reviewed and updated.  Review  of Systems  Constitutional: Negative.   Respiratory: Negative.   Cardiovascular: Negative.   Genitourinary: Negative.   Musculoskeletal: Positive for back pain. Negative for arthralgias, gait problem, joint swelling, myalgias, neck pain and neck stiffness.  Skin: Negative.   Neurological: Negative.   Psychiatric/Behavioral: Negative.     Per HPI unless specifically indicated above     Objective:    BP 123/89 (BP Location: Left Arm, Patient Position: Sitting, Cuff Size: Normal)   Pulse 81   Temp 98.6 F (37 C)   Wt 177 lb 6 oz (80.5 kg)   LMP 09/11/2016   SpO2 96%   BMI 31.12 kg/m   Wt Readings from Last 3 Encounters:  09/06/17 177 lb 6 oz (80.5 kg)  08/22/17 174 lb 8 oz (79.2 kg)  07/16/17 175 lb 7 oz (79.6 kg)    Physical Exam  Constitutional: She is oriented to person, place, and time. She appears well-developed and well-nourished. No distress.  HENT:  Head: Normocephalic and atraumatic.  Right Ear: Hearing normal.  Left Ear: Hearing normal.  Nose: Nose normal.  Eyes: Conjunctivae and lids are normal. Right eye exhibits no discharge. Left eye exhibits no discharge. No scleral icterus.  Cardiovascular: Normal rate, regular rhythm, normal heart sounds and intact distal pulses. Exam reveals no gallop and no friction rub.  No murmur heard. Pulmonary/Chest: Effort normal and breath sounds normal. No stridor. No respiratory distress. She has no wheezes. She has no rales. She exhibits no tenderness.  Musculoskeletal: Normal range of motion. She exhibits no edema, tenderness or deformity.  Neurological: She is alert and oriented to person, place, and time.  Skin: Skin is  warm, dry and intact. Capillary refill takes less than 2 seconds. No rash noted. She is not diaphoretic. No erythema. No pallor.  Psychiatric: She has a normal mood and affect. Her speech is normal and behavior is normal. Judgment and thought content normal. Cognition and memory are normal.  Nursing note and  vitals reviewed.   Results for orders placed or performed in visit on 08/22/17  POCT urinalysis dipstick  Result Value Ref Range   Color, UA Yellow    Clarity, UA Cloudy    Glucose, UA NEGATIVE    Bilirubin, UA NEGATIVE    Ketones, UA NEGATIVE    Spec Grav, UA 1.010 1.010 - 1.025   Blood, UA LARGE +++    pH, UA 6.5 5.0 - 8.0   Protein, UA TRACE    Urobilinogen, UA 0.2 0.2 or 1.0 E.U./dL   Nitrite, UA POSITIVE    Leukocytes, UA Large (3+) (A) Negative   Appearance     Odor        Assessment & Plan:   Problem List Items Addressed This Visit    None    Visit Diagnoses    Flank pain    -  Primary   UA clear. No sign of UTI. Call with any concerns.    Relevant Orders   UA/M w/rflx Culture, Routine   Acute bilateral low back pain without sciatica       Will do stretches. Offered flexeril- declined. Call with any concerns or if not getting better.        Follow up plan: Return before 10/15/17 , for Physical.

## 2017-09-06 NOTE — Patient Instructions (Signed)

## 2017-09-28 ENCOUNTER — Encounter: Payer: Self-pay | Admitting: Family Medicine

## 2017-09-28 ENCOUNTER — Ambulatory Visit (INDEPENDENT_AMBULATORY_CARE_PROVIDER_SITE_OTHER): Payer: 59 | Admitting: Family Medicine

## 2017-09-28 VITALS — BP 125/77 | HR 72 | Temp 98.3°F | Wt 173.2 lb

## 2017-09-28 DIAGNOSIS — M25542 Pain in joints of left hand: Secondary | ICD-10-CM | POA: Diagnosis not present

## 2017-09-28 DIAGNOSIS — Z114 Encounter for screening for human immunodeficiency virus [HIV]: Secondary | ICD-10-CM

## 2017-09-28 DIAGNOSIS — Z1211 Encounter for screening for malignant neoplasm of colon: Secondary | ICD-10-CM | POA: Diagnosis not present

## 2017-09-28 DIAGNOSIS — R7301 Impaired fasting glucose: Secondary | ICD-10-CM | POA: Diagnosis not present

## 2017-09-28 DIAGNOSIS — Z1231 Encounter for screening mammogram for malignant neoplasm of breast: Secondary | ICD-10-CM | POA: Diagnosis not present

## 2017-09-28 DIAGNOSIS — Z0000 Encounter for general adult medical examination without abnormal findings: Secondary | ICD-10-CM | POA: Diagnosis not present

## 2017-09-28 DIAGNOSIS — Z1239 Encounter for other screening for malignant neoplasm of breast: Secondary | ICD-10-CM

## 2017-09-28 LAB — UA/M W/RFLX CULTURE, ROUTINE
Bilirubin, UA: NEGATIVE
Glucose, UA: NEGATIVE
Ketones, UA: NEGATIVE
Leukocytes, UA: NEGATIVE
Nitrite, UA: NEGATIVE
Protein, UA: NEGATIVE
Specific Gravity, UA: 1.01 (ref 1.005–1.030)
Urobilinogen, Ur: 0.2 mg/dL (ref 0.2–1.0)
pH, UA: 6.5 (ref 5.0–7.5)

## 2017-09-28 LAB — MICROALBUMIN, URINE WAIVED
Creatinine, Urine Waived: 100 mg/dL (ref 10–300)
Microalb, Ur Waived: 10 mg/L (ref 0–19)
Microalb/Creat Ratio: <30 mg/g (ref <30)

## 2017-09-28 LAB — BAYER DCA HB A1C WAIVED: HB A1C (BAYER DCA - WAIVED): 5.3 % (ref <7.0)

## 2017-09-28 LAB — MICROSCOPIC EXAMINATION: Bacteria, UA: NONE SEEN

## 2017-09-28 NOTE — Patient Instructions (Addendum)
[facility]  Address: [address] [place], [place], [place] [address]  Phone: [phone]   Health Maintenance for Postmenopausal Women Menopause is a normal process in which your reproductive ability comes to an end. This process happens gradually over a span of months to years, usually between the ages of 68 and 80. Menopause is complete when you have missed 12 consecutive menstrual periods. It is important to talk with your health care provider about some of the most common conditions that affect postmenopausal women, such as heart disease, cancer, and bone loss (osteoporosis). Adopting a healthy lifestyle and getting preventive care can help to promote your health and wellness. Those actions can also lower your chances of developing some of these common conditions. What should I know about menopause? During menopause, you may experience a number of symptoms, such as:  Moderate-to-severe hot flashes.  Night sweats.  Decrease in sex drive.  Mood swings.  Headaches.  Tiredness.  Irritability.  Memory problems.  Insomnia.  Choosing to treat or not to treat menopausal changes is an individual decision that you make with your health care provider. What should I know about hormone replacement therapy and supplements? Hormone therapy products are effective for treating symptoms that are associated with menopause, such as hot flashes and night sweats. Hormone replacement carries certain risks, especially as you become older. If you are thinking about using estrogen or estrogen with progestin treatments, discuss the benefits and risks with your health care provider. What should I know about heart disease and stroke? Heart disease, heart attack, and stroke become more likely as you age. This may be due, in part, to the hormonal changes that your body experiences during menopause. These can affect how your body processes dietary fats, triglycerides, and  cholesterol. Heart attack and stroke are both medical emergencies. There are many things that you can do to help prevent heart disease and stroke:  Have your blood pressure checked at least every 1-2 years. High blood pressure causes heart disease and increases the risk of stroke.  If you are 20-77 years old, ask your health care provider if you should take aspirin to prevent a heart attack or a stroke.  Do not use any tobacco products, including cigarettes, chewing tobacco, or electronic cigarettes. If you need help quitting, ask your health care provider.  It is important to eat a healthy diet and maintain a healthy weight. ? Be sure to include plenty of vegetables, fruits, low-fat dairy products, and lean protein. ? Avoid eating foods that are high in solid fats, added sugars, or salt (sodium).  Get regular exercise. This is one of the most important things that you can do for your health. ? Try to exercise for at least 150 minutes each week. The type of exercise that you do should increase your heart rate and make you sweat. This is known as moderate-intensity exercise. ? Try to do strengthening exercises at least twice each week. Do these in addition to the moderate-intensity exercise.  Know your numbers.Ask your health care provider to check your cholesterol and your blood glucose. Continue to have your blood tested as directed by your health care provider.  What should I know about cancer screening? There are several types of cancer. Take the following steps to reduce your risk and to catch any cancer development as early as possible. Breast Cancer  Practice breast self-awareness. ? This means understanding how your breasts normally appear and feel. ? It also means  doing regular breast self-exams. Let your health care provider know about any changes, no matter how small.  If you are 41 or older, have a clinician do a breast exam (clinical breast exam or CBE) every year. Depending  on your age, family history, and medical history, it may be recommended that you also have a yearly breast X-ray (mammogram).  If you have a family history of breast cancer, talk with your health care provider about genetic screening.  If you are at high risk for breast cancer, talk with your health care provider about having an MRI and a mammogram every year.  Breast cancer (BRCA) gene test is recommended for women who have family members with BRCA-related cancers. Results of the assessment will determine the need for genetic counseling and BRCA1 and for BRCA2 testing. BRCA-related cancers include these types: ? Breast. This occurs in males or females. ? Ovarian. ? Tubal. This may also be called fallopian tube cancer. ? Cancer of the abdominal or pelvic lining (peritoneal cancer). ? Prostate. ? Pancreatic.  Cervical, Uterine, and Ovarian Cancer Your health care provider may recommend that you be screened regularly for cancer of the pelvic organs. These include your ovaries, uterus, and vagina. This screening involves a pelvic exam, which includes checking for microscopic changes to the surface of your cervix (Pap test).  For women ages 21-65, health care providers may recommend a pelvic exam and a Pap test every three years. For women ages 81-65, they may recommend the Pap test and pelvic exam, combined with testing for human papilloma virus (HPV), every five years. Some types of HPV increase your risk of cervical cancer. Testing for HPV may also be done on women of any age who have unclear Pap test results.  Other health care providers may not recommend any screening for nonpregnant women who are considered low risk for pelvic cancer and have no symptoms. Ask your health care provider if a screening pelvic exam is right for you.  If you have had past treatment for cervical cancer or a condition that could lead to cancer, you need Pap tests and screening for cancer for at least 20 years after  your treatment. If Pap tests have been discontinued for you, your risk factors (such as having a new sexual partner) need to be reassessed to determine if you should start having screenings again. Some women have medical problems that increase the chance of getting cervical cancer. In these cases, your health care provider may recommend that you have screening and Pap tests more often.  If you have a family history of uterine cancer or ovarian cancer, talk with your health care provider about genetic screening.  If you have vaginal bleeding after reaching menopause, tell your health care provider.  There are currently no reliable tests available to screen for ovarian cancer.  Lung Cancer Lung cancer screening is recommended for adults 82-21 years old who are at high risk for lung cancer because of a history of smoking. A yearly low-dose CT scan of the lungs is recommended if you:  Currently smoke.  Have a history of at least 30 pack-years of smoking and you currently smoke or have quit within the past 15 years. A pack-year is smoking an average of one pack of cigarettes per day for one year.  Yearly screening should:  Continue until it has been 15 years since you quit.  Stop if you develop a health problem that would prevent you from having lung cancer treatment.  Colorectal  Cancer  This type of cancer can be detected and can often be prevented.  Routine colorectal cancer screening usually begins at age 61 and continues through age 22.  If you have risk factors for colon cancer, your health care provider may recommend that you be screened at an earlier age.  If you have a family history of colorectal cancer, talk with your health care provider about genetic screening.  Your health care provider may also recommend using home test kits to check for hidden blood in your stool.  A small camera at the end of a tube can be used to examine your colon directly (sigmoidoscopy or colonoscopy).  This is done to check for the earliest forms of colorectal cancer.  Direct examination of the colon should be repeated every 5-10 years until age 50. However, if early forms of precancerous polyps or small growths are found or if you have a family history or genetic risk for colorectal cancer, you may need to be screened more often.  Skin Cancer  Check your skin from head to toe regularly.  Monitor any moles. Be sure to tell your health care provider: ? About any new moles or changes in moles, especially if there is a change in a mole's shape or color. ? If you have a mole that is larger than the size of a pencil eraser.  If any of your family members has a history of skin cancer, especially at a young age, talk with your health care provider about genetic screening.  Always use sunscreen. Apply sunscreen liberally and repeatedly throughout the day.  Whenever you are outside, protect yourself by wearing long sleeves, pants, a wide-brimmed hat, and sunglasses.  What should I know about osteoporosis? Osteoporosis is a condition in which bone destruction happens more quickly than new bone creation. After menopause, you may be at an increased risk for osteoporosis. To help prevent osteoporosis or the bone fractures that can happen because of osteoporosis, the following is recommended:  If you are 58-61 years old, get at least 1,000 mg of calcium and at least 600 mg of vitamin D per day.  If you are older than age 65 but younger than age 38, get at least 1,200 mg of calcium and at least 600 mg of vitamin D per day.  If you are older than age 78, get at least 1,200 mg of calcium and at least 800 mg of vitamin D per day.  Smoking and excessive alcohol intake increase the risk of osteoporosis. Eat foods that are rich in calcium and vitamin D, and do weight-bearing exercises several times each week as directed by your health care provider. What should I know about how menopause affects my mental  health? Depression may occur at any age, but it is more common as you become older. Common symptoms of depression include:  Low or sad mood.  Changes in sleep patterns.  Changes in appetite or eating patterns.  Feeling an overall lack of motivation or enjoyment of activities that you previously enjoyed.  Frequent crying spells.  Talk with your health care provider if you think that you are experiencing depression. What should I know about immunizations? It is important that you get and maintain your immunizations. These include:  Tetanus, diphtheria, and pertussis (Tdap) booster vaccine.  Influenza every year before the flu season begins.  Pneumonia vaccine.  Shingles vaccine.  Your health care provider may also recommend other immunizations. This information is not intended to replace advice given to  you by your health care provider. Make sure you discuss any questions you have with your health care provider. Document Released: 05/26/2005 Document Revised: 10/22/2015 Document Reviewed: 01/05/2015 Elsevier Interactive Patient Education  2018 [employer].

## 2017-09-28 NOTE — Assessment & Plan Note (Signed)
Under good control with A1c of 5.3. Call with any concerns.

## 2017-09-28 NOTE — Assessment & Plan Note (Signed)
Vaccines up to date. Screening labs checked today. Pap up to date. Colonoscopy and mammogram ordered. Continue diet and exercise. Call with any concerns.

## 2017-09-28 NOTE — Progress Notes (Signed)
BP 125/77 (BP Location: Left Arm, Patient Position: Sitting, Cuff Size: Normal)   Pulse 72   Temp 98.3 F (36.8 C)   Wt 173 lb 4 oz (78.6 kg)   LMP 08/28/2017 (Within Weeks)   SpO2 95%   BMI 30.40 kg/m    Subjective:    Patient ID: Brittany Ochoa, female    DOB: [birth year], 52 y.o.   MRN: [id number]  HPI: Brittany Ochoa is a 52 y.o. female presenting on 09/28/2017 for comprehensive medical examination. Current medical complaints include:none  Started having bleeding- last bled Sep 11 2016, then had another period April 15, again in May  Menopausal Symptoms: yes- has hot flashes and hormone spikes, comes and goes, not particularly bothering her.  Depression Screen done today and results listed below:  Depression screen [facility] 2/9 09/28/2017 10/14/2015  Decreased Interest 1 0  Down, Depressed, Hopeless 1 0  PHQ - 2 Score 2 0  Altered sleeping 2 -  Tired, decreased energy 2 -  Change in appetite 2 -  Feeling bad or failure about yourself  1 -  Trouble concentrating 2 -  Moving slowly or fidgety/restless 0 -  Suicidal thoughts 0 -  PHQ-9 Score 11 -     Past Medical History:  Past Medical History:  Diagnosis Date  . AAA (abdominal aortic aneurysm) ([place])   . Diabetes mellitus without complication ([place])    Prediabetes 04/29/17  . Thyroid disease    thyroid nodules    Surgical History:  Past Surgical History:  Procedure Laterality Date  . BREAST REDUCTION SURGERY  1998  . lipoma removal  2007   back    Medications:  No current outpatient medications on file prior to visit.   No current facility-administered medications on file prior to visit.     Allergies:  Allergies  Allergen Reactions  . Codeine     Social History:  Social History   Socioeconomic History  . Marital status: Single    Spouse name: Not on file  . Number of children: Not on file  . Years of education: Not on file  . Highest education level: Not on file  Occupational History  .  Not on file  Social Needs  . Financial resource strain: Not on file  . Food insecurity:    Worry: Not on file    Inability: Not on file  . Transportation needs:    Medical: Not on file    Non-medical: Not on file  Tobacco Use  . Smoking status: Former Smoker    Packs/day: 1.00    Years: 25.00    Pack years: 25.00    Last attempt to quit: 04/17/2004    Years since quitting: 13.4  . Smokeless tobacco: Never Used  Substance and Sexual Activity  . Alcohol use: Yes    Alcohol/week: 0.0 oz    Comment: social  . Drug use: No    Comment: past only  . Sexual activity: Not Currently  Lifestyle  . Physical activity:    Days per week: Not on file    Minutes per session: Not on file  . Stress: Not on file  Relationships  . Social connections:    Talks on phone: Not on file    Gets together: Not on file    Attends religious service: Not on file    Active member of club or organization: Not on file    Attends meetings of clubs or organizations: Not on file  Relationship status: Not on file  . Intimate partner violence:    Fear of current or ex partner: Not on file    Emotionally abused: Not on file    Physically abused: Not on file    Forced sexual activity: Not on file  Other Topics Concern  . Not on file  Social History Narrative  . Not on file   Social History   Tobacco Use  Smoking Status Former Smoker  . Packs/day: 1.00  . Years: 25.00  . Pack years: 25.00  . Last attempt to quit: 04/17/2004  . Years since quitting: 13.4  Smokeless Tobacco Never Used   Social History   Substance and Sexual Activity  Alcohol Use Yes  . Alcohol/week: 0.0 oz   Comment: social    Family History:  Family History  Problem Relation Age of Onset  . Diabetes Mother   . Diabetes Father   . Heart disease Father   . Heart attack Father   . Cancer Father   . Cancer Paternal Grandmother   . Heart disease Paternal Grandmother   . Cancer Paternal Grandfather   . Heart disease Paternal  Grandfather   . Osteoporosis Maternal Grandmother     Past medical history, surgical history, medications, allergies, family history and social history reviewed with patient today and changes made to appropriate areas of the chart.   Review of Systems  Constitutional: Positive for diaphoresis and malaise/fatigue. Negative for chills, fever and weight loss.  HENT: Negative.   Eyes: Positive for blurred vision. Negative for double vision, photophobia, pain, discharge and redness.  Respiratory: Negative.   Cardiovascular: Negative.   Gastrointestinal: Negative.   Genitourinary: Negative.   Musculoskeletal: Positive for myalgias. Negative for back pain, falls, joint pain and neck pain.  Skin: Negative.   Neurological: Positive for dizziness and weakness. Negative for tingling, tremors, sensory change, speech change, focal weakness, seizures, loss of consciousness and headaches.    All other ROS negative except what is listed above and in the HPI.      Objective:    BP 125/77 (BP Location: Left Arm, Patient Position: Sitting, Cuff Size: Normal)   Pulse 72   Temp 98.3 F (36.8 C)   Wt 173 lb 4 oz (78.6 kg)   LMP 08/28/2017 (Within Weeks)   SpO2 95%   BMI 30.40 kg/m   Wt Readings from Last 3 Encounters:  09/28/17 173 lb 4 oz (78.6 kg)  09/06/17 177 lb 6 oz (80.5 kg)  08/22/17 174 lb 8 oz (79.2 kg)    Physical Exam  Constitutional: She is oriented to person, place, and time. She appears well-developed and well-nourished. No distress.  HENT:  Head: Normocephalic and atraumatic.  Right Ear: Hearing, tympanic membrane, external ear and ear canal normal.  Left Ear: Hearing, tympanic membrane, external ear and ear canal normal.  Nose: Nose normal.  Mouth/Throat: Uvula is midline, oropharynx is clear and moist and mucous membranes are normal. No oropharyngeal exudate.  Eyes: Pupils are equal, round, and reactive to light. Conjunctivae, EOM and lids are normal. Right eye exhibits no  discharge. Left eye exhibits no discharge. No scleral icterus.  Neck: Normal range of motion. Neck supple. No JVD present. No tracheal deviation present. No thyromegaly present.  Cardiovascular: Normal rate, regular rhythm, normal heart sounds and intact distal pulses. Exam reveals no gallop and no friction rub.  No murmur heard. Pulmonary/Chest: Effort normal and breath sounds normal. No stridor. No respiratory distress. She has no wheezes. She has  no rales. She exhibits no tenderness. Right breast exhibits no inverted nipple, no mass, no nipple discharge, no skin change and no tenderness. Left breast exhibits no inverted nipple, no mass, no nipple discharge, no skin change and no tenderness. No breast swelling, tenderness, discharge or bleeding. Breasts are symmetrical.  Abdominal: Soft. Bowel sounds are normal. She exhibits no distension and no mass. There is no tenderness. There is no rebound and no guarding. No hernia.  Musculoskeletal: Normal range of motion. She exhibits no edema, tenderness or deformity.  Lymphadenopathy:    She has no cervical adenopathy.  Neurological: She is alert and oriented to person, place, and time. She displays normal reflexes. No cranial nerve deficit or sensory deficit. She exhibits normal muscle tone. Coordination normal.  Skin: Skin is warm, dry and intact. Capillary refill takes less than 2 seconds. No rash noted. She is not diaphoretic. No erythema. No pallor.  Psychiatric: She has a normal mood and affect. Her speech is normal and behavior is normal. Judgment and thought content normal. Cognition and memory are normal.  Nursing note and vitals reviewed.   Results for orders placed or performed in visit on 09/06/17  UA/M w/rflx Culture, Routine  Result Value Ref Range   Specific Gravity, UA <1.005 (L) 1.005 - 1.030   pH, UA 6.0 5.0 - 7.5   Color, UA Yellow Yellow   Appearance Ur Clear Clear   Leukocytes, UA Negative Negative   Protein, UA Negative  Negative/Trace   Glucose, UA Negative Negative   Ketones, UA Negative Negative   RBC, UA Negative Negative   Bilirubin, UA Negative Negative   Urobilinogen, Ur 0.2 0.2 - 1.0 mg/dL   Nitrite, UA Negative Negative      Assessment & Plan:   Problem List Items Addressed This Visit      Endocrine   Impaired fasting glucose    Under good control with A1c of 5.3. Call with any concerns.       Relevant Orders   Bayer DCA Hb A1c Waived     Other   Routine general medical examination at a health care facility - Primary    Vaccines up to date. Screening labs checked today. Pap up to date. Colonoscopy and mammogram ordered. Continue diet and exercise. Call with any concerns.       Relevant Orders   Bayer DCA Hb A1c Waived   CBC with Differential/Platelet   Comprehensive metabolic panel   Lipid Panel w/o Chol/HDL Ratio   Microalbumin, Urine Waived   TSH   UA/M w/rflx Culture, Routine    Other Visit Diagnoses    Screening for breast cancer       Mammogram ordered today.   Relevant Orders   MM DIGITAL SCREENING BILATERAL   Screening for colon cancer       Family history of GI cancer. Referral to GI made today.   Relevant Orders   Ambulatory referral to Gastroenterology   Screening for HIV without presence of risk factors       Labs drawn today. Await results.    Relevant Orders   HIV antibody   Arthralgia of left hand       Will obtain x-ray. Use capsacian. Call with any concerns.    Relevant Orders   DG Hand Complete Left       Follow up plan: Return in about 1 year (around 09/29/2018) for Physical.   LABORATORY TESTING:  - Pap smear: up to date  IMMUNIZATIONS:   -  Tdap: Tetanus vaccination status reviewed: last tetanus booster within 10 years. - Influenza: Postponed to flu season - Pneumovax: Not applicable  SCREENING: -Mammogram: Ordered today  - Colonoscopy: Ordered today   PATIENT COUNSELING:   Advised to take 1 mg of folate supplement per day if capable  of pregnancy.   Sexuality: Discussed sexually transmitted diseases, partner selection, use of condoms, avoidance of unintended pregnancy  and contraceptive alternatives.   Advised to avoid cigarette smoking.  I discussed with the patient that most people either abstain from alcohol or drink within safe limits (<=14/week and <=4 drinks/occasion for males, <=7/weeks and <= 3 drinks/occasion for females) and that the risk for alcohol disorders and other health effects rises proportionally with the number of drinks per week and how often a drinker exceeds daily limits.  Discussed cessation/primary prevention of drug use and availability of treatment for abuse.   Diet: Encouraged to adjust caloric intake to maintain  or achieve ideal body weight, to reduce intake of dietary saturated fat and total fat, to limit sodium intake by avoiding high sodium foods and not adding table salt, and to maintain adequate dietary potassium and calcium preferably from fresh fruits, vegetables, and low-fat dairy products.    stressed the importance of regular exercise  Injury prevention: Discussed safety belts, safety helmets, smoke detector, smoking near bedding or upholstery.   Dental health: Discussed importance of regular tooth brushing, flossing, and dental visits.    NEXT PREVENTATIVE PHYSICAL DUE IN 1 YEAR. Return in about 1 year (around 09/29/2018) for Physical.

## 2017-09-29 LAB — TSH: TSH: 0.536 u[IU]/mL (ref 0.450–4.500)

## 2017-09-29 LAB — COMPREHENSIVE METABOLIC PANEL
ALT: 20 IU/L (ref 0–32)
AST: 22 IU/L (ref 0–40)
Albumin/Globulin Ratio: 2 (ref 1.2–2.2)
Albumin: 4.7 g/dL (ref 3.5–5.5)
Alkaline Phosphatase: 91 IU/L (ref 39–117)
BUN/Creatinine Ratio: 19 (ref 9–23)
BUN: 15 mg/dL (ref 6–24)
Bilirubin Total: 0.3 mg/dL (ref 0.0–1.2)
CO2: 25 mmol/L (ref 20–29)
Calcium: 9.6 mg/dL (ref 8.7–10.2)
Chloride: 100 mmol/L (ref 96–106)
Creatinine, Ser: 0.8 mg/dL (ref 0.57–1.00)
GFR calc Af Amer: 99 mL/min/{1.73_m2} (ref >59)
GFR calc non Af Amer: 86 mL/min/{1.73_m2} (ref >59)
Globulin, Total: 2.3 g/dL (ref 1.5–4.5)
Glucose: 84 mg/dL (ref 65–99)
Potassium: 4.2 mmol/L (ref 3.5–5.2)
Sodium: 140 mmol/L (ref 134–144)
Total Protein: 7 g/dL (ref 6.0–8.5)

## 2017-09-29 LAB — CBC WITH DIFFERENTIAL/PLATELET
Basophils Absolute: 0 10*3/uL (ref 0.0–0.2)
Basos: 0 %
EOS (ABSOLUTE): 0.1 10*3/uL (ref 0.0–0.4)
Eos: 2 %
Hematocrit: 41.5 % (ref 34.0–46.6)
Hemoglobin: 14 g/dL (ref 11.1–15.9)
Immature Grans (Abs): 0 10*3/uL (ref 0.0–0.1)
Immature Granulocytes: 0 %
Lymphocytes Absolute: 1.6 10*3/uL (ref 0.7–3.1)
Lymphs: 23 %
MCH: 30.8 pg (ref 26.6–33.0)
MCHC: 33.7 g/dL (ref 31.5–35.7)
MCV: 91 fL (ref 79–97)
Monocytes Absolute: 0.3 10*3/uL (ref 0.1–0.9)
Monocytes: 5 %
Neutrophils Absolute: 5.1 10*3/uL (ref 1.4–7.0)
Neutrophils: 70 %
Platelets: 271 10*3/uL (ref 150–450)
RBC: 4.55 x10E6/uL (ref 3.77–5.28)
RDW: 14.1 % (ref 12.3–15.4)
WBC: 7.1 10*3/uL (ref 3.4–10.8)

## 2017-09-29 LAB — LIPID PANEL W/O CHOL/HDL RATIO
Cholesterol, Total: 221 mg/dL — ABNORMAL HIGH (ref 100–199)
HDL: 56 mg/dL (ref >39)
LDL Calculated: 138 mg/dL — ABNORMAL HIGH (ref 0–99)
Triglycerides: 135 mg/dL (ref 0–149)
VLDL Cholesterol Cal: 27 mg/dL (ref 5–40)

## 2017-09-29 LAB — HIV ANTIBODY (ROUTINE TESTING W REFLEX): HIV Screen 4th Generation wRfx: NONREACTIVE

## 2017-10-26 ENCOUNTER — Encounter: Payer: Self-pay | Admitting: Medical

## 2017-10-26 ENCOUNTER — Ambulatory Visit: Payer: 59 | Admitting: Family Medicine

## 2017-10-26 ENCOUNTER — Ambulatory Visit (INDEPENDENT_AMBULATORY_CARE_PROVIDER_SITE_OTHER): Payer: Self-pay | Admitting: Medical

## 2017-10-26 ENCOUNTER — Other Ambulatory Visit: Payer: Self-pay

## 2017-10-26 ENCOUNTER — Encounter: Payer: Self-pay | Admitting: Family Medicine

## 2017-10-26 VITALS — BP 122/80 | HR 69 | Temp 98.4°F | Wt 169.2 lb

## 2017-10-26 VITALS — BP 140/92 | HR 65 | Temp 98.8°F | Wt 171.0 lb

## 2017-10-26 DIAGNOSIS — J45990 Exercise induced bronchospasm: Secondary | ICD-10-CM | POA: Insufficient documentation

## 2017-10-26 DIAGNOSIS — R0602 Shortness of breath: Secondary | ICD-10-CM

## 2017-10-26 MED ORDER — PREDNISONE 50 MG PO TABS: 50.0000 mg | ORAL_TABLET | Freq: Every day | ORAL | 0 refills | Status: DC

## 2017-10-26 MED ORDER — AZITHROMYCIN 250 MG PO TABS
ORAL_TABLET | ORAL | 0 refills | Status: DC
Start: 2017-10-26 — End: 2017-12-18

## 2017-10-26 NOTE — Patient Instructions (Signed)
To follow up with  Your primary care provider,  [facility] clinic walk in or [facility] or the Emergency room for a chest xray and further evaluation.   Shortness of Breath, Adult Shortness of breath means you have trouble breathing. Your lungs are organs for breathing. Follow these instructions at home: Pay attention to any changes in your symptoms. Take these actions to help with your condition:  Do not smoke. Smoking can cause shortness of breath. If you need help to quit smoking, ask your doctor.  Avoid things that can make it harder to breathe, such as: ? Mold. ? Dust. ? Air pollution. ? Chemical smells. ? Things that can cause allergy symptoms (allergens), if you have allergies.  Keep your living space clean and free of mold and dust.  Rest as needed. Slowly return to your usual activities.  Take over-the-counter and prescription medicines, including oxygen and inhaled medicines, only as told by your doctor.  Keep all follow-up visits as told by your doctor. This is important.  Contact a doctor if:  Your condition does not get better as soon as expected.  You have a hard time doing your normal activities, even after you rest.  You have new symptoms. Get help right away if:  You have trouble breathing when you are resting.  You feel light-headed or you faint.  You have a cough that is not helped by medicines.  You cough up blood.  You have pain with breathing.  You have pain in your chest, arms, shoulders, or belly (abdomen).  You have a fever.  You cannot walk up stairs.  You cannot exercise the way you normally do. This information is not intended to replace advice given to you by your health care provider. Make sure you discuss any questions you have with your health care provider. Document Released: 09/20/2007 Document Revised: 04/20/2016 Document Reviewed: 04/20/2016 Elsevier Interactive Patient Education  2017 [employer].

## 2017-10-26 NOTE — Progress Notes (Addendum)
Subjective:    Patient ID: Brittany Ochoa, female    DOB: 1965-05-31, 52 y.o.   MRN: [id number]  HPI 52 yo female in non acute distress. Presents today with complaints of chest congestion and burning. Started left over Biaxin 3 days ago "I just found it in my closet". I think I am getting Bronchitis. Started not feeling well last week towards the end of the week.  Running on the treadmill on Monday ,had burning in chest and shortness of breath, had to stop. Progressively got worse chest congestion, cough productive white with a little yellow. Wednesday tried to work out again but became short of breath with burning in chest.  Wednesday night started Biaxin. Thursday am dose but then forgot to take it on Thursday night and took a dose on Friday am.  Last night thought she was having a hot flash but had really bad chills," just covered myself with a blanket, and tried to wait it out". Feeling better now, cough productive still. " I can feel it moving in my chest and a swishing sound when I lay down."     Past smoker  X 25 years 1ppd up to  3ppd, quit Jan 1st 2006. Over all feel better, feels "less raw in chest.But I still do not feel right" Works at the  [employer]. Hx of Asending aortic Aneurysm Last cat scan in Jan 2019  4.1cm pending surgery at 4.8 cm has a smaller than normal ascending aorta per patient. Prior history of Pneumonia.  Review of Systems  Constitutional: Positive for chills (I have massive hot flashes, but I may have chills) and fever (pt not sure).  HENT: Positive for voice change. Negative for congestion, rhinorrhea, sinus pressure, sinus pain and sore throat.   Eyes: Positive for discharge (crusting in eyes in both eyes "just a little" this is how I know I am sick). Negative for itching and visual disturbance.  Respiratory: Positive for cough (productive), shortness of breath (hx of exercised induced asthma, sob on the treadmill even with using her  inhaler prior to exercise) and wheezing (using inhaler).   Cardiovascular: Negative for chest pain, palpitations and leg swelling.  Gastrointestinal: Positive for abdominal pain (stomach area yesterday none today, "I did not eat much yesterday").  Musculoskeletal: Negative for myalgias.  Skin: Negative for rash.  Allergic/Immunologic: Positive for environmental allergies. Negative for food allergies.  Neurological: Negative for dizziness, syncope and light-headedness.  Hematological: Negative for adenopathy.  Psychiatric/Behavioral: Negative for behavioral problems, confusion, self-injury and suicidal ideas. The patient is not nervous/anxious.   difficulty hearing left ear.    Objective:   Physical Exam  Constitutional: She is oriented to person, place, and time. She appears well-developed and well-nourished.  HENT:  Head: Normocephalic and atraumatic.  Right Ear: External ear normal.  Left Ear: External ear normal.  Eyes: Pupils are equal, round, and reactive to light. Conjunctivae and EOM are normal.  Neck: Normal range of motion. Neck supple.  Cardiovascular: Normal rate, regular rhythm and normal heart sounds.  Pulmonary/Chest: Effort normal. She has decreased breath sounds. She has wheezes (rhonchi left upper lung fields) in the left upper field and the left middle field. She has rhonchi in the left upper field and the left middle field.      Neurological: She is alert and oriented to person, place, and time.  Skin: Skin is warm and dry.  Psychiatric: She has a normal mood and affect. Her behavior is normal. Judgment  and thought content normal.  Nursing note and vitals reviewed.         Assessment & Plan:  Shortness of breath, chest burning possible Pneumonia Recommended chest x-ray due to shortness of breath and audible sounds of rhonchi with wheeze. Patient referred to [facility] walk in clinic, [facility], or her primary care provider, or the Emergency Department. Offered  patient work note but she declined. She does get off at  2 pm but she says she has no time to see anyone because her cat is sick, (I gave him an enema last night). She used to be a Camera operator. She says she will continue her Biaxin again I recommended chest x-ray, told her my concerns that the Biaxin may  not be working. Recommended she not exercise till she is well. She wants to wait till Monday again discussed with patient that is best she seek out further evaluation/treatment today. Patient verbalizes understanding and has no questions at discharge. She states she may go to the [facility] in [place] if she can fit it in. I agreed with patient that was also a place that can do a chest x-ray.

## 2017-10-26 NOTE — Progress Notes (Signed)
BP 122/80   Pulse 69   Temp 98.4 F (36.9 C) (Oral)   Wt 169 lb 3.2 oz (76.7 kg)   SpO2 95%   BMI 29.69 kg/m    Subjective:    Patient ID: Brittany Ochoa, female    DOB: 09/23/1965, 52 y.o.   MRN: [id number]  HPI: Brittany Ochoa is a 52 y.o. female  Chief Complaint  Patient presents with  . Breathing problems    first started last Monday while in the gym. pt states she went to [facility] to be seen   UPPER RESPIRATORY TRACT INFECTION Duration: About 2 weeks Worst symptom: Chest congestion Fever: unsure, chills and sweats Cough: yes Shortness of breath: yes Wheezing: no Chest pain: no Chest tightness: yes Chest congestion: yes Nasal congestion: no Runny nose: no Post nasal drip: no Sneezing: no Sore throat: no Swollen glands: no Sinus pressure: no Headache: no Face pain: no Toothache: no Ear pain: no Ear pressure: yes bilateral Eyes red/itching:no Eye drainage/crusting: yes  Vomiting: no Rash: no Fatigue: yes Sick contacts: no Strep contacts: no  Context: stable Recurrent sinusitis: no Relief with OTC cold/cough medications: no  Treatments attempted: some old biaxin,    Relevant past medical, surgical, family and social history reviewed and updated as indicated. Interim medical history since our last visit reviewed. Allergies and medications reviewed and updated.  Review of Systems  Constitutional: Positive for chills and fatigue. Negative for activity change, appetite change, diaphoresis, fever and unexpected weight change.  Respiratory: Positive for cough, shortness of breath and wheezing. Negative for apnea, choking, chest tightness and stridor.   Cardiovascular: Negative.   Psychiatric/Behavioral: Negative.     Per HPI unless specifically indicated above     Objective:    BP 122/80   Pulse 69   Temp 98.4 F (36.9 C) (Oral)   Wt 169 lb 3.2 oz (76.7 kg)   SpO2 95%   BMI 29.69 kg/m   Wt Readings from Last 3 Encounters:  10/26/17  169 lb 3.2 oz (76.7 kg)  10/26/17 171 lb (77.6 kg)  09/28/17 173 lb 4 oz (78.6 kg)    Physical Exam  Constitutional: She is oriented to person, place, and time. She appears well-developed and well-nourished. No distress.  HENT:  Head: Normocephalic and atraumatic.  Right Ear: Hearing and external ear normal.  Left Ear: Hearing and external ear normal.  Nose: Nose normal.  Mouth/Throat: Oropharynx is clear and moist. No oropharyngeal exudate.  Eyes: Pupils are equal, round, and reactive to light. Conjunctivae, EOM and lids are normal. Right eye exhibits no discharge. Left eye exhibits no discharge. No scleral icterus.  Neck: Normal range of motion. Neck supple. No JVD present. No tracheal deviation present. No thyromegaly present.  Cardiovascular: Normal rate, regular rhythm, normal heart sounds and intact distal pulses. Exam reveals no gallop and no friction rub.  No murmur heard. Pulmonary/Chest: Effort normal. No stridor. No respiratory distress. She has wheezes (on forced expiration, otherwise normal). She has no rales. She exhibits no tenderness.  Musculoskeletal: Normal range of motion.  Lymphadenopathy:    She has no cervical adenopathy.  Neurological: She is alert and oriented to person, place, and time.  Skin: Skin is warm, dry and intact. Capillary refill takes less than 2 seconds. No rash noted. She is not diaphoretic. No erythema. No pallor.  Psychiatric: She has a normal mood and affect. Her speech is normal and behavior is normal. Judgment and thought content normal. Cognition and memory are  normal.  Nursing note and vitals reviewed.   Results for orders placed or performed in visit on 09/28/17  Microscopic Examination  Result Value Ref Range   WBC, UA 0-5 0 - 5 /hpf   RBC, UA 0-2 0 - 2 /hpf   Epithelial Cells (non renal) 0-10 0 - 10 /hpf   Bacteria, UA None seen None seen/Few  Bayer DCA Hb A1c Waived  Result Value Ref Range   HB A1C (BAYER DCA - WAIVED) 5.3 <7.0 %    CBC with Differential/Platelet  Result Value Ref Range   WBC 7.1 3.4 - 10.8 x10E3/uL   RBC 4.55 3.77 - 5.28 x10E6/uL   Hemoglobin 14.0 11.1 - 15.9 g/dL   Hematocrit 41.5 34.0 - 46.6 %   MCV 91 79 - 97 fL   MCH 30.8 26.6 - 33.0 pg   MCHC 33.7 31.5 - 35.7 g/dL   RDW 14.1 12.3 - 15.4 %   Platelets 271 150 - 450 x10E3/uL   Neutrophils 70 Not Estab. %   Lymphs 23 Not Estab. %   Monocytes 5 Not Estab. %   Eos 2 Not Estab. %   Basos 0 Not Estab. %   Neutrophils Absolute 5.1 1.4 - 7.0 x10E3/uL   Lymphocytes Absolute 1.6 0.7 - 3.1 x10E3/uL   Monocytes Absolute 0.3 0.1 - 0.9 x10E3/uL   EOS (ABSOLUTE) 0.1 0.0 - 0.4 x10E3/uL   Basophils Absolute 0.0 0.0 - 0.2 x10E3/uL   Immature Granulocytes 0 Not Estab. %   Immature Grans (Abs) 0.0 0.0 - 0.1 x10E3/uL  Comprehensive metabolic panel  Result Value Ref Range   Glucose 84 65 - 99 mg/dL   BUN 15 6 - 24 mg/dL   Creatinine, Ser 0.80 0.57 - 1.00 mg/dL   GFR calc non Af Amer 86 >59 mL/min/1.73   GFR calc Af Amer 99 >59 mL/min/1.73   BUN/Creatinine Ratio 19 9 - 23   Sodium 140 134 - 144 mmol/L   Potassium 4.2 3.5 - 5.2 mmol/L   Chloride 100 96 - 106 mmol/L   CO2 25 20 - 29 mmol/L   Calcium 9.6 8.7 - 10.2 mg/dL   Total Protein 7.0 6.0 - 8.5 g/dL   Albumin 4.7 3.5 - 5.5 g/dL   Globulin, Total 2.3 1.5 - 4.5 g/dL   Albumin/Globulin Ratio 2.0 1.2 - 2.2   Bilirubin Total 0.3 0.0 - 1.2 mg/dL   Alkaline Phosphatase 91 39 - 117 IU/L   AST 22 0 - 40 IU/L   ALT 20 0 - 32 IU/L  Lipid Panel w/o Chol/HDL Ratio  Result Value Ref Range   Cholesterol, Total 221 (H) 100 - 199 mg/dL   Triglycerides 135 0 - 149 mg/dL   HDL 56 >39 mg/dL   VLDL Cholesterol Cal 27 5 - 40 mg/dL   LDL Calculated 138 (H) 0 - 99 mg/dL  Microalbumin, Urine Waived  Result Value Ref Range   Microalb, Ur Waived 10 0 - 19 mg/L   Creatinine, Urine Waived 100 10 - 300 mg/dL   Microalb/Creat Ratio <30 <30 mg/g  TSH  Result Value Ref Range   TSH 0.536 0.450 - 4.500 uIU/mL  UA/M  w/rflx Culture, Routine  Result Value Ref Range   Specific Gravity, UA 1.010 1.005 - 1.030   pH, UA 6.5 5.0 - 7.5   Color, UA Yellow Yellow   Appearance Ur Clear Clear   Leukocytes, UA Negative Negative   Protein, UA Negative Negative/Trace   Glucose, UA Negative Negative  Ketones, UA Negative Negative   RBC, UA 1+ (A) Negative   Bilirubin, UA Negative Negative   Urobilinogen, Ur 0.2 0.2 - 1.0 mg/dL   Nitrite, UA Negative Negative   Microscopic Examination See below:   HIV antibody  Result Value Ref Range   HIV Screen 4th Generation wRfx Non Reactive Non Reactive      Assessment & Plan:   Problem List Items Addressed This Visit      Respiratory   Exercise-induced asthma - Primary    Will treat with azithromycin and prednisone. Call if not getting better or getting worse.       Relevant Medications   albuterol (PROVENTIL HFA;VENTOLIN HFA) 108 (90 Base) MCG/ACT inhaler   predniSONE (DELTASONE) 50 MG tablet       Follow up plan: Return if symptoms worsen or fail to improve.

## 2017-10-26 NOTE — Assessment & Plan Note (Signed)
Will treat with azithromycin and prednisone. Call if not getting better or getting worse.

## 2017-10-31 ENCOUNTER — Ambulatory Visit: Payer: 59 | Admitting: Family Medicine

## 2017-11-01 ENCOUNTER — Encounter

## 2017-11-01 ENCOUNTER — Ambulatory Visit: Payer: Self-pay | Admitting: Neurology

## 2017-11-13 ENCOUNTER — Ambulatory Visit (INDEPENDENT_AMBULATORY_CARE_PROVIDER_SITE_OTHER): Payer: Self-pay | Admitting: Family Medicine

## 2017-11-13 VITALS — BP 126/88 | HR 62 | Temp 98.5°F | Wt 170.0 lb

## 2017-11-13 DIAGNOSIS — R059 Cough, unspecified: Secondary | ICD-10-CM

## 2017-11-13 DIAGNOSIS — R067 Sneezing: Secondary | ICD-10-CM

## 2017-11-13 DIAGNOSIS — R05 Cough: Secondary | ICD-10-CM

## 2017-11-13 NOTE — Progress Notes (Signed)
Patient ID: Brittany Ochoa, female    DOB: 06-19-1965, 52 y.o.   MRN: [id number]  PCP: [name], DO  Chief Complaint  Patient presents with  . chest bronchitis    Subjective:  HPI Brittany Ochoa is a 52 y.o. female presents for evaluation bronchitis. Medical history significant exercise-induced asthma. She reports over the last few days periodically coughing and sneezing. "I am getting sick" "Zpaks don't cure me". She was seen by her PCP 10/26/2017 and placed on a course of prednisone and Azithromycin. Initially felt better although now is concern for illness as she has started experiencing coughing and sneezing . She has not attempted relief with any medication. She is requesting Biaxin which she reports she was able to always have prescribed in the past and this has resolved her illness. Denies wheezing, shortness of breath, or productive or persistent cough.  Social History   Socioeconomic History  . Marital status: Single    Spouse name: Not on file  . Number of children: Not on file  . Years of education: Not on file  . Highest education level: Not on file  Occupational History  . Not on file  Social Needs  . Financial resource strain: Not on file  . Food insecurity:    Worry: Not on file    Inability: Not on file  . Transportation needs:    Medical: Not on file    Non-medical: Not on file  Tobacco Use  . Smoking status: Former Smoker    Packs/day: 1.00    Years: 25.00    Pack years: 25.00    Last attempt to quit: 04/17/2004    Years since quitting: 13.5  . Smokeless tobacco: Never Used  Substance and Sexual Activity  . Alcohol use: Yes    Alcohol/week: 0.0 oz    Comment: social  . Drug use: No    Comment: past only  . Sexual activity: Not Currently  Lifestyle  . Physical activity:    Days per week: Not on file    Minutes per session: Not on file  . Stress: Not on file  Relationships  . Social connections:    Talks on phone: Not on file    Gets  together: Not on file    Attends religious service: Not on file    Active member of club or organization: Not on file    Attends meetings of clubs or organizations: Not on file    Relationship status: Not on file  . Intimate partner violence:    Fear of current or ex partner: Not on file    Emotionally abused: Not on file    Physically abused: Not on file    Forced sexual activity: Not on file  Other Topics Concern  . Not on file  Social History Narrative  . Not on file    Family History  Problem Relation Age of Onset  . Diabetes Mother   . Diabetes Father   . Heart disease Father   . Heart attack Father   . Cancer Father   . Cancer Paternal Grandmother   . Heart disease Paternal Grandmother   . Cancer Paternal Grandfather   . Heart disease Paternal Grandfather   . Osteoporosis Maternal Grandmother    Review of Systems Pertinent negatives listed in HPI Patient Active Problem List   Diagnosis Date Noted  . Exercise-induced asthma 10/26/2017  . Dizziness 07/16/2017  . Multiple thyroid nodules 07/15/2017  . 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  Mitral regurgitation 07/15/2017  . History of radiation exposure 07/15/2017  . Impaired fasting glucose 05/04/2017  . Elevated BP without diagnosis of hypertension 05/04/2017  . Ascending aortic aneurysm ([place]) 05/04/2017  . 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 Codeine     Prior to Admission medications   Medication Sig Start Date End Date Taking? Authorizing Provider  albuterol (PROVENTIL HFA;VENTOLIN HFA) 108 (90 Base) MCG/ACT inhaler Inhale into the lungs every 6 (six) hours as needed for wheezing or shortness of breath.   Yes [provider]  azithromycin (ZITHROMAX) 250 MG tablet 2 tabs today, then 1 tab daily for 4 days Patient not taking: Reported on 11/13/2017 10/26/17   Park Liter P, DO  predniSONE (DELTASONE) 50 MG tablet Take 1 tablet (50 mg  total) by mouth daily with breakfast. Patient not taking: Reported on 11/13/2017 10/26/17   [name], DO    Past Medical, Surgical Family and Social History reviewed and updated.    Objective:   Today's Vitals   11/13/17 1212  BP: 126/88  Pulse: 62  Temp: 98.5 F (36.9 C)  TempSrc: Oral  SpO2: 98%  Weight: 170 lb (77.1 kg)    Wt Readings from Last 3 Encounters:  11/13/17 170 lb (77.1 kg)  10/26/17 169 lb 3.2 oz (76.7 kg)  10/26/17 171 lb (77.6 kg)   Physical Exam  Constitutional: She is oriented to person, place, and time. She appears well-developed and well-nourished.  HENT:  Head: Normocephalic and atraumatic.  Eyes: Pupils are equal, round, and reactive to light. Conjunctivae and EOM are normal.  Neck: Normal range of motion. Neck supple.  Cardiovascular: Normal rate, regular rhythm and normal heart sounds.  Pulmonary/Chest: Effort normal and breath sounds normal. No respiratory distress. She has no wheezes. She exhibits no tenderness.  Lymphadenopathy:    She has no cervical adenopathy.  Neurological: She is alert and oriented to person, place, and time.  Skin: Skin is warm and dry.  Psychiatric: She has a normal mood and affect. Her behavior is normal. Judgment and thought content normal.     Assessment & Plan:  1. Coughing 2. Sneezing Discussed in detail with patient the likelihood that her symptoms are allergy and or viral in nature. Recommended conservative treatment with antihistamine therapy, specifically Singulair or Cetrizine. Patient declined all conservative treatment. Educated on the appropriateness and indications for antibiotic therapy.   If symptoms worsen or do not improve, return for follow-up, follow-up with PCP, or at the emergency department if severity of symptoms warrant a higher level of care.     Carroll Sage. Kenton Kingfisher, MSN, FNP-C [facility]  [address] [place], [place] [address] [phone]

## 2017-11-20 ENCOUNTER — Ambulatory Visit
Admission: RE | Admit: 2017-11-20 | Discharge: 2017-11-20 | Disposition: A | Discharge status: Home / Self Care | Payer: 59 | Source: Ambulatory Visit | Attending: Family Medicine | Admitting: Family Medicine

## 2017-11-20 DIAGNOSIS — M79642 Pain in left hand: Secondary | ICD-10-CM | POA: Diagnosis not present

## 2017-11-20 DIAGNOSIS — M25542 Pain in joints of left hand: Secondary | ICD-10-CM | POA: Insufficient documentation

## 2017-11-22 ENCOUNTER — Telehealth: Payer: Self-pay | Admitting: Family Medicine

## 2017-11-22 NOTE — Telephone Encounter (Signed)
Patient thinks that it may be tendonitis, she has been wearing a wrist brace since Monday and it is starting to feel better. Patient states that she will be going out of town tomorrow and that she will decide what to do when she comes back from vacation.

## 2017-11-22 NOTE — Telephone Encounter (Signed)
Patient notified

## 2017-11-22 NOTE — Telephone Encounter (Signed)
Left message on machine for pt to return call to the office. CRM updated.  

## 2017-11-22 NOTE — Telephone Encounter (Signed)
Please let her know that Brittany Ochoa released her results to her yesterday. They were normal. Thanks!

## 2017-11-22 NOTE — Telephone Encounter (Signed)
Copied from [place] [id number]. Topic: General - Other >> Nov 20, 2017  8:49 AM Yvette Rack wrote: Reason for CRM: pt calling wanting Park Liter to know that she just went to have xray done on her lt wrist and want to know the results before she go on vacation on Friday please give her a call back at [phone]

## 2017-12-18 ENCOUNTER — Other Ambulatory Visit: Payer: Self-pay

## 2017-12-18 ENCOUNTER — Ambulatory Visit: Payer: 59 | Admitting: Family Medicine

## 2017-12-18 ENCOUNTER — Encounter: Payer: Self-pay | Admitting: Family Medicine

## 2017-12-18 VITALS — BP 142/88 | HR 67 | Temp 98.0°F | Wt 175.2 lb

## 2017-12-18 DIAGNOSIS — R1319 Other dysphagia: Secondary | ICD-10-CM

## 2017-12-18 DIAGNOSIS — R1310 Dysphagia, unspecified: Secondary | ICD-10-CM

## 2017-12-18 NOTE — Progress Notes (Signed)
BP (!) 142/88   Pulse 67   Temp 98 F (36.7 C) (Oral)   Wt 175 lb 3.2 oz (79.5 kg)   SpO2 97%   BMI 30.74 kg/m    Subjective:    Patient ID: Brittany Ochoa, female    DOB: 03-Feb-1966, 52 y.o.   MRN: [id number]  HPI: Brittany Ochoa is a 52 y.o. female  Chief Complaint  Patient presents with  . Pain    x 1 month but got worse this couple of days/pt state having problems with eating, swallowing/ States feeling hoarse and pain behind  breatbone  . Cough   DYSPHAGIA- feels like she can't swallow and has pain behind her breast bone Duration: About a month Description of symptom: painful and feels like something gets stuck Onset: Several seconds after swallowing Location of dysphagia: chest Dysphagia to solids only: yes Dysphagia to solids & liquids: no  Frequency:constant  Progressively getting worse: yes Alleviatiating factors: nothing Provoking factors: eating in general Status: worse EGD: no Weight loss: no Sensation of lump in throat: yes Heartburn: no Odynophagia: yes Nausea: yes Vomiting: no Drooling/nasal regurgitation/food spillage: no Coughing/choking/dysphonia: yes Dysarthria: no Hematemesis: no Regurgitation of undigested food/halitosis: no Chest pain: yes  Relevant past medical, surgical, family and social history reviewed and updated as indicated. Interim medical history since our last visit reviewed. Allergies and medications reviewed and updated.  Review of Systems  Constitutional: Negative.   HENT: Negative.   Respiratory: Negative.   Cardiovascular: Positive for chest pain. Negative for palpitations and leg swelling.  Gastrointestinal: Positive for abdominal pain and nausea. Negative for abdominal distention, anal bleeding, blood in stool, constipation, diarrhea, rectal pain and vomiting.  Skin: Negative.   Psychiatric/Behavioral: Negative.     Per HPI unless specifically indicated above     Objective:    BP (!) 142/88   Pulse  67   Temp 98 F (36.7 C) (Oral)   Wt 175 lb 3.2 oz (79.5 kg)   SpO2 97%   BMI 30.74 kg/m   Wt Readings from Last 3 Encounters:  12/18/17 175 lb 3.2 oz (79.5 kg)  11/13/17 170 lb (77.1 kg)  10/26/17 169 lb 3.2 oz (76.7 kg)    Physical Exam  Constitutional: She is oriented to person, place, and time. She appears well-developed and well-nourished. No distress.  HENT:  Head: Normocephalic and atraumatic.  Right Ear: Hearing normal.  Left Ear: Hearing normal.  Nose: Nose normal.  Eyes: Conjunctivae and lids are normal. Right eye exhibits no discharge. Left eye exhibits no discharge. No scleral icterus.  Cardiovascular: Normal rate, regular rhythm, normal heart sounds and intact distal pulses. Exam reveals no gallop and no friction rub.  No murmur heard. Pulmonary/Chest: Effort normal and breath sounds normal. No stridor. No respiratory distress. She has no wheezes. She has no rales. She exhibits no tenderness.  Abdominal: Soft. Bowel sounds are normal. She exhibits no distension and no mass. There is no tenderness. There is no rebound and no guarding. No hernia.  Musculoskeletal: Normal range of motion.  Neurological: She is alert and oriented to person, place, and time.  Skin: Skin is warm, dry and intact. Capillary refill takes less than 2 seconds. No rash noted. She is not diaphoretic. No erythema. No pallor.  Psychiatric: She has a normal mood and affect. Her speech is normal and behavior is normal. Judgment and thought content normal. Cognition and memory are normal.  Nursing note and vitals reviewed.   Results for orders  placed or performed in visit on 09/28/17  Microscopic Examination  Result Value Ref Range   WBC, UA 0-5 0 - 5 /hpf   RBC, UA 0-2 0 - 2 /hpf   Epithelial Cells (non renal) 0-10 0 - 10 /hpf   Bacteria, UA None seen None seen/Few  Bayer DCA Hb A1c Waived  Result Value Ref Range   HB A1C (BAYER DCA - WAIVED) 5.3 <7.0 %  CBC with Differential/Platelet  Result  Value Ref Range   WBC 7.1 3.4 - 10.8 x10E3/uL   RBC 4.55 3.77 - 5.28 x10E6/uL   Hemoglobin 14.0 11.1 - 15.9 g/dL   Hematocrit 41.5 34.0 - 46.6 %   MCV 91 79 - 97 fL   MCH 30.8 26.6 - 33.0 pg   MCHC 33.7 31.5 - 35.7 g/dL   RDW 14.1 12.3 - 15.4 %   Platelets 271 150 - 450 x10E3/uL   Neutrophils 70 Not Estab. %   Lymphs 23 Not Estab. %   Monocytes 5 Not Estab. %   Eos 2 Not Estab. %   Basos 0 Not Estab. %   Neutrophils Absolute 5.1 1.4 - 7.0 x10E3/uL   Lymphocytes Absolute 1.6 0.7 - 3.1 x10E3/uL   Monocytes Absolute 0.3 0.1 - 0.9 x10E3/uL   EOS (ABSOLUTE) 0.1 0.0 - 0.4 x10E3/uL   Basophils Absolute 0.0 0.0 - 0.2 x10E3/uL   Immature Granulocytes 0 Not Estab. %   Immature Grans (Abs) 0.0 0.0 - 0.1 x10E3/uL  Comprehensive metabolic panel  Result Value Ref Range   Glucose 84 65 - 99 mg/dL   BUN 15 6 - 24 mg/dL   Creatinine, Ser 0.80 0.57 - 1.00 mg/dL   GFR calc non Af Amer 86 >59 mL/min/1.73   GFR calc Af Amer 99 >59 mL/min/1.73   BUN/Creatinine Ratio 19 9 - 23   Sodium 140 134 - 144 mmol/L   Potassium 4.2 3.5 - 5.2 mmol/L   Chloride 100 96 - 106 mmol/L   CO2 25 20 - 29 mmol/L   Calcium 9.6 8.7 - 10.2 mg/dL   Total Protein 7.0 6.0 - 8.5 g/dL   Albumin 4.7 3.5 - 5.5 g/dL   Globulin, Total 2.3 1.5 - 4.5 g/dL   Albumin/Globulin Ratio 2.0 1.2 - 2.2   Bilirubin Total 0.3 0.0 - 1.2 mg/dL   Alkaline Phosphatase 91 39 - 117 IU/L   AST 22 0 - 40 IU/L   ALT 20 0 - 32 IU/L  Lipid Panel w/o Chol/HDL Ratio  Result Value Ref Range   Cholesterol, Total 221 (H) 100 - 199 mg/dL   Triglycerides 135 0 - 149 mg/dL   HDL 56 >39 mg/dL   VLDL Cholesterol Cal 27 5 - 40 mg/dL   LDL Calculated 138 (H) 0 - 99 mg/dL  Microalbumin, Urine Waived  Result Value Ref Range   Microalb, Ur Waived 10 0 - 19 mg/L   Creatinine, Urine Waived 100 10 - 300 mg/dL   Microalb/Creat Ratio <30 <30 mg/g  TSH  Result Value Ref Range   TSH 0.536 0.450 - 4.500 uIU/mL  UA/M w/rflx Culture, Routine  Result Value Ref  Range   Specific Gravity, UA 1.010 1.005 - 1.030   pH, UA 6.5 5.0 - 7.5   Color, UA Yellow Yellow   Appearance Ur Clear Clear   Leukocytes, UA Negative Negative   Protein, UA Negative Negative/Trace   Glucose, UA Negative Negative   Ketones, UA Negative Negative   RBC, UA 1+ (A) Negative  Bilirubin, UA Negative Negative   Urobilinogen, Ur 0.2 0.2 - 1.0 mg/dL   Nitrite, UA Negative Negative   Microscopic Examination See below:   HIV antibody  Result Value Ref Range   HIV Screen 4th Generation wRfx Non Reactive Non Reactive      Assessment & Plan:   Problem List Items Addressed This Visit    None    Visit Diagnoses    Odynophagia    -  Primary   Getting worse. Will get CXR and will get her into see GI- urgent referral generated today. Appointment scheduled. Call with any concerns.    Relevant Orders   Ambulatory referral to Gastroenterology   DG Chest 2 View   Esophageal dysphagia       Getting worse. Will get CXR and will get her into see GI- urgent referral generated today. Appointment scheduled. Call with any concerns.    Relevant Orders   Ambulatory referral to Gastroenterology       Follow up plan: Return if symptoms worsen or fail to improve.

## 2017-12-19 ENCOUNTER — Ambulatory Visit: Payer: Self-pay | Admitting: Gastroenterology

## 2017-12-19 ENCOUNTER — Ambulatory Visit: Payer: 59 | Admitting: Gastroenterology

## 2017-12-19 ENCOUNTER — Encounter: Payer: Self-pay | Admitting: Gastroenterology

## 2017-12-19 VITALS — BP 133/80 | HR 73 | Ht 63.0 in | Wt 175.6 lb

## 2017-12-19 DIAGNOSIS — R1310 Dysphagia, unspecified: Secondary | ICD-10-CM

## 2017-12-19 DIAGNOSIS — R1319 Other dysphagia: Secondary | ICD-10-CM

## 2017-12-19 NOTE — Patient Instructions (Signed)
F/U 3 months  Dysphagia Dysphagia is trouble swallowing. This condition occurs when solids and liquids stick in a person's throat on the way down to the stomach, or when food takes longer to get to the stomach. You may have problems swallowing food, liquids, or both. You may also have pain while trying to swallow. It may take you more time and effort to swallow something. What are the causes? This condition is caused by:  Problems with the muscles. They may make it difficult for you to move food and liquids through the tube that connects your mouth to your stomach (esophagus). You may have ulcers, scar tissue, or inflammation that blocks the normal passage of food and liquids. Causes of these problems include: ? Acid reflux from your stomach into your esophagus (gastroesophageal reflux). ? Infections. ? Radiation treatment for cancer. ? Medicines taken without enough fluids to wash them down into your stomach.  Nerve problems. These prevent signals from being sent to the muscles of your esophagus to squeeze (contract) and move what you swallow down to your stomach.  Globus pharyngeus. This is a common problem that involves feeling like something is stuck in the throat or a sense of trouble with swallowing even though nothing is wrong with the swallowing passages.  Stroke. This can affect the nerves and make it difficult to swallow.  Certain conditions, such as cerebral palsy or Parkinson disease.  What are the signs or symptoms? Common symptoms of this condition include:  A feeling that solids or liquids are stuck in your throat on the way down to the stomach.  Food taking too long to get to the stomach.  Other symptoms include:  Food moving back from your stomach to your mouth (regurgitation).  Noises coming from your throat.  Chest discomfort with swallowing.  A feeling of fullness when swallowing.  Drooling, especially when the throat is blocked.  Pain while  swallowing.  Heartburn.  Coughing or gagging while trying to swallow.  How is this diagnosed? This condition is diagnosed by:  Barium X-ray. In this test, you swallow a white substance (contrast medium)that sticks to the inside of your esophagus. X-ray images are then taken.  Endoscopy. In this test, a flexible telescope is inserted down your throat to look at your esophagus and your stomach.  CT scans and MRI.  How is this treated? Treatment for dysphagia depends on the cause of the condition:  If the dysphagia is caused by acid reflux or infection, medicines may be used. They may include antibiotics and heartburn medicines.  If the dysphagia is caused by problems with your muscles, swallowing therapy may be used to help you strengthen your swallowing muscles. You may have to do specific exercises to strengthen the muscles or stretch them.  If the dysphagia is caused by a blockage or mass, procedures to remove the blockage may be done. You may need surgery and a feeding tube.  You may need to make diet changes. Ask your health care provider for specific instructions. Follow these instructions at home: Eating and drinking  Try to eat soft food that is easier to swallow.  Follow any diet changes as told by your health care provider.  Cut your food into small pieces and eat slowly.  Eat and drink only when you are sitting upright.  Do not drink alcohol or caffeine. If you need help quitting, ask your health care provider. General instructions  Check your weight every day to make sure you are not losing  weight.  Take over-the-counter and prescription medicines only as told by your health care provider.  If you were prescribed an antibiotic medicine, take it as told by your health care provider. Do not stop taking the antibiotic even if you start to feel better.  Do not use any products that contain nicotine or tobacco, such as cigarettes and e-cigarettes. If you need help  quitting, ask your health care provider.  Keep all follow-up visits as told by your health care provider. This is important. Contact a health care provider if:  You lose weight because you cannot swallow.  You cough when you drink liquids (aspiration).  You cough up partially digested food. Get help right away if:  You cannot swallow your saliva.  You have shortness of breath or a fever, or both.  You have a hoarse voice and also have trouble swallowing. Summary  Dysphagia is trouble swallowing. This condition occurs when solids and liquids stick in a person's throat on the way down to the stomach, or when food takes longer to get to the stomach.  Dysphagia has many possible causes and symptoms.  Treatment for dysphagia depends on the cause of the condition. This information is not intended to replace advice given to you by your health care provider. Make sure you discuss any questions you have with your health care provider. Document Released: 03/31/2000 Document Revised: 03/23/2016 Document Reviewed: 03/23/2016 Elsevier Interactive Patient Education  2017 [employer].

## 2017-12-19 NOTE — Addendum Note (Signed)
Addended by: Earl Lagos on: 12/19/2017 04:28 PM   Modules accepted: Orders, SmartSet

## 2017-12-19 NOTE — Progress Notes (Signed)
Brittany Ochoa [address]  [place], [place] [address]  Main: [phone]  Fax: [phone]   Gastroenterology Consultation  Referring Provider:     [name], DO Primary Care Physician:  [name], DO Primary Gastroenterologist:  Dr. Vonda Ochoa Reason for Consultation:     Dysphagia, Odynophagia        HPI:    Chief Complaint  Patient presents with  . New Patient (Initial Visit)    referral from Mellody Memos, DO for odynophagia, esophageal dysphagia    Brittany Ochoa is a 52 y.o. y/o female referred for consultation & management  by Dr. Wynetta Emery, Megan P, DO.  Patient reports history of dysphagia and odynophagia to solid foods for the last 4 days.  Is unable to eat meats, and dry breads like she used to.  Even asked to follow soft foods like macaroni and cheese with a lot of liquid.  Points to her lower neck, as the location where food seems to cause pain and gets stuck.  Has not had to bring food back up.  No ER visits for food impaction.  No prior EGD or colonoscopy.  No new medications.  Not on any steroid inhalers.  Uses albuterol inhaler before exercise due to patient's reported history of exercise-induced asthma.  No family history of colon cancer.  Past Medical History:  Diagnosis Date  . AAA (abdominal aortic aneurysm) ([place])   . Diabetes mellitus without complication ([place])    Prediabetes 04/29/17  . Thyroid disease    thyroid nodules    Past Surgical History:  Procedure Laterality Date  . BREAST REDUCTION SURGERY  1998  . lipoma removal  2007   back    Prior to Admission medications   Medication Sig Start Date End Date Taking? Authorizing Provider  albuterol (PROVENTIL HFA;VENTOLIN HFA) 108 (90 Base) MCG/ACT inhaler Inhale into the lungs every 6 (six) hours as needed for wheezing or shortness of breath.   Yes [provider]    Family History  Problem Relation Age of Onset  . Diabetes Mother   .  Diabetes Father   . Heart disease Father   . Heart attack Father   . Cancer Father   . Cancer Paternal Grandmother   . Heart disease Paternal Grandmother   . Cancer Paternal Grandfather   . Heart disease Paternal Grandfather   . Osteoporosis Maternal Grandmother      Social History   Tobacco Use  . Smoking status: Former Smoker    Packs/day: 1.00    Years: 25.00    Pack years: 25.00    Last attempt to quit: 04/17/2004    Years since quitting: 13.6  . Smokeless tobacco: Never Used  Substance Use Topics  . Alcohol use: Yes    Alcohol/week: 0.0 standard drinks    Comment: social  . Drug use: No    Comment: past only    Allergies as of 12/19/2017 - Review Complete 12/19/2017  Allergen Reaction Noted  . Codeine  08/18/2015    Review of Systems:    All systems reviewed and negative except where noted in HPI.   Physical Exam:  BP 133/80   Pulse 73   Ht 5\' 3"  (1.6 m)   Wt 175 lb 9.6 oz (79.7 kg)   BMI 31.11 kg/m  No LMP recorded. (Menstrual status: Irregular Periods). Psych:  Alert and cooperative. Normal mood and affect. General:   Alert,  Well-developed, well-nourished, pleasant and cooperative  in NAD Head:  Normocephalic and atraumatic. Eyes:  Sclera clear, no icterus.   Conjunctiva pink. Ears:  Normal auditory acuity. Nose:  No deformity, discharge, or lesions. Mouth:  No deformity or lesions,oropharynx pink & moist. Neck:  Supple; no masses or thyromegaly. Abdomen:  Normal bowel sounds.  No bruits.  Soft, non-tender and non-distended without masses, hepatosplenomegaly or hernias noted.  No guarding or rebound tenderness.    Msk:  Symmetrical without gross deformities. Good, equal movement & strength bilaterally. Pulses:  Normal pulses noted. Extremities:  No clubbing or edema.  No cyanosis. Neurologic:  Alert and oriented x3;  grossly normal neurologically. Skin:  Intact without significant lesions or rashes. No jaundice. Lymph Nodes:  No significant cervical  adenopathy. Psych:  Alert and cooperative. Normal mood and affect.   Labs: CBC    Component Value Date/Time   WBC 7.1 09/28/2017 1613   WBC 8.0 04/29/2017 2005   RBC 4.55 09/28/2017 1613   RBC 4.80 04/29/2017 2005   HGB 14.0 09/28/2017 1613   HCT 41.5 09/28/2017 1613   PLT 271 09/28/2017 1613   MCV 91 09/28/2017 1613   MCH 30.8 09/28/2017 1613   MCH 30.2 04/29/2017 2005   MCHC 33.7 09/28/2017 1613   MCHC 34.5 04/29/2017 2005   RDW 14.1 09/28/2017 1613   LYMPHSABS 1.6 09/28/2017 1613   EOSABS 0.1 09/28/2017 1613   BASOSABS 0.0 09/28/2017 1613   CMP     Component Value Date/Time   NA 140 09/28/2017 1613   K 4.2 09/28/2017 1613   CL 100 09/28/2017 1613   CO2 25 09/28/2017 1613   GLUCOSE 84 09/28/2017 1613   GLUCOSE 107 (H) 04/29/2017 2005   BUN 15 09/28/2017 1613   CREATININE 0.80 09/28/2017 1613   CALCIUM 9.6 09/28/2017 1613   PROT 7.0 09/28/2017 1613   ALBUMIN 4.7 09/28/2017 1613   AST 22 09/28/2017 1613   ALT 20 09/28/2017 1613   ALKPHOS 91 09/28/2017 1613   BILITOT 0.3 09/28/2017 1613   GFRNONAA 86 09/28/2017 1613   GFRAA 99 09/28/2017 1613    Imaging Studies: Dg Hand Complete Left  Result Date: 11/20/2017 CLINICAL DATA:  Pain and loss of grip strength EXAM: LEFT HAND - COMPLETE 3+ VIEW COMPARISON:  None. FINDINGS: Frontal, oblique, and lateral views obtained. There is no evident fracture or dislocation. Joint spaces appear normal. No erosive change. IMPRESSION: No fracture or dislocation.  No evident arthropathy. Electronically Signed   By: Lowella Grip III M.D.   On: 11/20/2017 08:55    Assessment and Plan:   Brittany Ochoa is a 52 y.o. y/o female has been referred for dysphagia and odynophagia to solid foods that began 4 days ago  EGD indicated for evaluation Possible Candida esophagitis versus ring strictures or narrowing versus EOE Patient tolerating soft food diet Patient asked to eat only dysphagia/pured foods.  Handout given for the  same.  Patient asked to come to the ER with any symptoms of food impaction, symptoms of such were discussed in detail and she verbalized understanding.  I have discussed alternative options, risks & benefits,  which include, but are not limited to, bleeding, infection, perforation,respiratory complication & drug reaction.  The patient agrees with this plan & written consent will be obtained.    She is also due for screening colonoscopy, but will not schedule at this time due to acute symptoms stated above.  Once the symptoms resolve and etiology of her symptoms is more clear, can schedule screening colonoscopy  at that time.  This was discussed with the patient as well and she verbalized understanding.  Dr Brittany Ochoa

## 2017-12-20 ENCOUNTER — Telehealth: Payer: Self-pay

## 2017-12-20 NOTE — Telephone Encounter (Signed)
Unable to reach pt by phone. Sent My Chart message to reschedule EGD that is scheduled for 9/9.

## 2017-12-21 ENCOUNTER — Other Ambulatory Visit: Payer: Self-pay

## 2017-12-21 ENCOUNTER — Telehealth: Payer: Self-pay

## 2017-12-21 DIAGNOSIS — R1310 Dysphagia, unspecified: Secondary | ICD-10-CM

## 2017-12-21 DIAGNOSIS — R1319 Other dysphagia: Secondary | ICD-10-CM

## 2017-12-21 NOTE — Telephone Encounter (Signed)
I tried to contact pt yesterday and today with no success, although pt had called endo for her time for her EGD and was told it was canceled by Trish due to not enough room in schedule. Home number, voice mail box full and no message left on work voice mail. I had previously sent pt an email but does not know how to use it per her message. Our available appts are 01/02/18 (Wed) in the pm with Dr. Vicente Males or with Dr. Bonna Gains on 01/07/18 (Monday) pm.

## 2017-12-21 NOTE — Telephone Encounter (Signed)
Pt calls and is upset because procedure  had to be changed. Pt opted to take 9/18 with Dr. Vicente Males. She was upset about being notified via my chart but I told her I could not reach her by phone at either number. She states she had no missed calls but I did try to contact her. Today I was also unsuccesful, home number-voice mailbox full and work voice mail was not her private voice mail so no message was left. I was having a difficult time communicating with pt over talking me, stating this is how you treat pt's who cannot swallow and I said that this was out of my control. We work together with [facility] and the providers to try to get what the pt's need. She could not believe this was the soonest appt. Pt stated she will take this appt but will be looking else where to have it done sooner and will call and cancel if she does.

## 2017-12-24 ENCOUNTER — Ambulatory Visit (INDEPENDENT_AMBULATORY_CARE_PROVIDER_SITE_OTHER): Payer: Self-pay | Admitting: Family Medicine

## 2017-12-24 ENCOUNTER — Encounter: Payer: Self-pay | Admitting: Family Medicine

## 2017-12-24 ENCOUNTER — Ambulatory Visit: Admission: RE | Admit: 2017-12-24 | Payer: 59 | Source: Ambulatory Visit | Admitting: Gastroenterology

## 2017-12-24 ENCOUNTER — Encounter: Admission: RE | Payer: Self-pay | Source: Ambulatory Visit

## 2017-12-24 VITALS — BP 152/100 | HR 65 | Temp 97.8°F | Wt 179.0 lb

## 2017-12-24 DIAGNOSIS — J329 Chronic sinusitis, unspecified: Secondary | ICD-10-CM

## 2017-12-24 SURGERY — ESOPHAGOGASTRODUODENOSCOPY (EGD) WITH PROPOFOL
Anesthesia: General

## 2017-12-24 MED ORDER — FLUCONAZOLE 150 MG PO TABS: 150.0000 mg | ORAL_TABLET | Freq: Once | ORAL | 0 refills | Status: AC

## 2017-12-24 MED ORDER — AMOXICILLIN-POT CLAVULANATE 875-125 MG PO TABS: 1.0000 | ORAL_TABLET | Freq: Two times a day (BID) | ORAL | 0 refills | Status: DC

## 2017-12-24 NOTE — Patient Instructions (Addendum)
Start Augmentin 1 tablet every 12 hours x 10 days.   Take Diflucan 150 mg once if vaginal irritation develops (female), if you do not need today, you may leave on file with pharmacy.  Resume use of Flonase, 2 sprays in both nares once-twice daily.  Follow-up as needed.    Sinusitis, Adult Sinusitis is soreness and inflammation of your sinuses. Sinuses are hollow spaces in the bones around your face. They are located:  Around your eyes.  In the middle of your forehead.  Behind your nose.  In your cheekbones.  Your sinuses and nasal passages are lined with a stringy fluid (mucus). Mucus normally drains out of your sinuses. When your nasal tissues get inflamed or swollen, the mucus can get trapped or blocked so air cannot flow through your sinuses. This lets bacteria, viruses, and funguses grow, and that leads to infection. Follow these instructions at home: Medicines  Take, use, or apply over-the-counter and prescription medicines only as told by your doctor. These may include nasal sprays.  If you were prescribed an antibiotic medicine, take it as told by your doctor. Do not stop taking the antibiotic even if you start to feel better. Hydrate and Humidify  Drink enough water to keep your pee (urine) clear or pale yellow.  Use a cool mist humidifier to keep the humidity level in your home above 50%.  Breathe in steam for 10-15 minutes, 3-4 times a day or as told by your doctor. You can do this in the bathroom while a hot shower is running.  Try not to spend time in cool or dry air. Rest  Rest as much as possible.  Sleep with your head raised (elevated).  Make sure to get enough sleep each night. General instructions  Put a warm, moist washcloth on your face 3-4 times a day or as told by your doctor. This will help with discomfort.  Wash your hands often with soap and water. If there is no soap and water, use hand sanitizer.  Do not smoke. Avoid being around people who  are smoking (secondhand smoke).  Keep all follow-up visits as told by your doctor. This is important. Contact a doctor if:  You have a fever.  Your symptoms get worse.  Your symptoms do not get better within 10 days. Get help right away if:  You have a very bad headache.  You cannot stop throwing up (vomiting).  You have pain or swelling around your face or eyes.  You have trouble seeing.  You feel confused.  Your neck is stiff.  You have trouble breathing. This information is not intended to replace advice given to you by your health care provider. Make sure you discuss any questions you have with your health care provider. Document Released: 09/20/2007 Document Revised: 11/28/2015 Document Reviewed: 01/27/2015 Elsevier Interactive Patient Education  [employer].

## 2017-12-24 NOTE — Progress Notes (Signed)
Patient ID: Kristyana Notte, female    DOB: Nov 11, 1965, 52 y.o.   MRN: [id number]  PCP: [name], DO  Chief Complaint  Patient presents with  . Sinus Problem    Subjective:  HPI  Nyiesha Beever is a 52 y.o. female presents for evaluation for possible sinusitis.  SINUSITIS Onset: 7 days of facial/sinus pressure with discolored nasal mucus, bilateral ear pressure and sore throat. Afebrile. Cough mild, nonproductive.Severity: moderate. Tried Mucinex without significant relief. She is current smoker and has history of seasonal allergies. Denies wheezing, shortness of breath, and or chest tightness.  Remainder of Review of Systems negative except as noted in the HPI.  Social History   Socioeconomic History  . Marital status: Single    Spouse name: Not on file  . Number of children: Not on file  . Years of education: Not on file  . Highest education level: Not on file  Occupational History  . Not on file  Social Needs  . Financial resource strain: Not on file  . Food insecurity:    Worry: Not on file    Inability: Not on file  . Transportation needs:    Medical: Not on file    Non-medical: Not on file  Tobacco Use  . Smoking status: Former Smoker    Packs/day: 1.00    Years: 25.00    Pack years: 25.00    Last attempt to quit: 04/17/2004    Years since quitting: 13.6  . Smokeless tobacco: Never Used  Substance and Sexual Activity  . Alcohol use: Yes    Alcohol/week: 0.0 standard drinks    Comment: social  . Drug use: No    Comment: past only  . Sexual activity: Not Currently  Lifestyle  . Physical activity:    Days per week: Not on file    Minutes per session: Not on file  . Stress: Not on file  Relationships  . Social connections:    Talks on phone: Not on file    Gets together: Not on file    Attends religious service: Not on file    Active member of club or organization: Not on file    Attends meetings of clubs or organizations: Not on file   Relationship status: Not on file  . Intimate partner violence:    Fear of current or ex partner: Not on file    Emotionally abused: Not on file    Physically abused: Not on file    Forced sexual activity: Not on file  Other Topics Concern  . Not on file  Social History Narrative  . Not on file    Family History  Problem Relation Age of Onset  . Diabetes Mother   . Diabetes Father   . Heart disease Father   . Heart attack Father   . Cancer Father   . Cancer Paternal Grandmother   . Heart disease Paternal Grandmother   . Cancer Paternal Grandfather   . Heart disease Paternal Grandfather   . Osteoporosis Maternal Grandmother      Review of Systems Pertinent negatives listed in HPI Patient Active Problem List   Diagnosis Date Noted  . Exercise-induced asthma 10/26/2017  . Dizziness 07/16/2017  . Multiple thyroid nodules 07/15/2017  . 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 Mitral regurgitation 07/15/2017  . History of radiation exposure 07/15/2017  . Impaired fasting glucose 05/04/2017  . Elevated BP without diagnosis of hypertension 05/04/2017  . Ascending aortic aneurysm ([place]) 05/04/2017  .  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 Codeine     Prior to Admission medications   Medication Sig Start Date End Date Taking? Authorizing Provider  albuterol (PROVENTIL HFA;VENTOLIN HFA) 108 (90 Base) MCG/ACT inhaler Inhale into the lungs every 6 (six) hours as needed for wheezing or shortness of breath.    [provider]    Past Medical, Surgical Family and Social History reviewed and updated.    Objective:   Today's Vitals   12/24/17 1250  BP: (!) 152/100  Pulse: 65  Temp: 97.8 F (36.6 C)  SpO2: 98%  Weight: 179 lb (81.2 kg)    Wt Readings from Last 3 Encounters:  12/19/17 175 lb 9.6 oz (79.7 kg)  12/18/17 175 lb 3.2 oz (79.5 kg)  11/13/17 170 lb (77.1 kg)   Physical Exam General Appearance:     Alert, cooperative, no distress  HENT:   ENT exam normal, no neck nodes or sinus tenderness, right nares edematous and erythematous. Rhinorrhea present. Right TM fluid noted, neck without nodes and throat normal without erythema or exudate  Eyes:    PERRL, conjunctiva/corneas clear, EOM's intact       Lungs:     Clear to auscultation bilaterally, respirations unlabored  Heart:    Regular rate and rhythm  Neurologic:   Awake, alert, oriented x 3. No apparent focal neurological           defect.    Assessment & Plan:  1. Sinusitis, unspecified chronicity, unspecified location Start Augmentin 1 tablet twice daily x 10 days with food to avoid stomach upset. Complete all medication. Resume use of Flonase, 2 sprays in both nares once-twice daily. Take Diflucan 150 mg once if vaginal irritation develops.  If symptoms worsen or do not improve, return for follow-up, follow-up with PCP, or at the emergency department if severity of symptoms warrant a higher level of care.   Carroll Sage. Kenton Kingfisher, MSN, FNP-C [facility]  [place], [place] [address] [phone]

## 2017-12-25 ENCOUNTER — Encounter: Payer: Self-pay | Admitting: Psychology

## 2017-12-26 ENCOUNTER — Telehealth: Payer: Self-pay | Admitting: Emergency Medicine

## 2017-12-26 NOTE — Telephone Encounter (Signed)
Left message follow up call from visit with [facility]. 

## 2017-12-27 ENCOUNTER — Ambulatory Visit
Admission: RE | Admit: 2017-12-27 | Discharge: 2017-12-27 | Disposition: A | Discharge status: Home / Self Care | Payer: 59 | Source: Ambulatory Visit | Attending: Family Medicine | Admitting: Family Medicine

## 2017-12-27 ENCOUNTER — Telehealth: Payer: Self-pay | Admitting: Family Medicine

## 2017-12-27 DIAGNOSIS — R1310 Dysphagia, unspecified: Secondary | ICD-10-CM | POA: Diagnosis not present

## 2017-12-27 DIAGNOSIS — R0602 Shortness of breath: Secondary | ICD-10-CM | POA: Diagnosis not present

## 2017-12-27 NOTE — Telephone Encounter (Signed)
Pt called in to see if she could be seen tomorrow after 2:30 called pt no response left voicemail put 2:30 slot for cheryl wicker on hold

## 2017-12-28 ENCOUNTER — Encounter: Payer: Self-pay | Admitting: Unknown Physician Specialty

## 2017-12-28 ENCOUNTER — Ambulatory Visit: Payer: 59 | Admitting: Unknown Physician Specialty

## 2017-12-28 VITALS — BP 123/84 | HR 71 | Temp 97.9°F | Ht 63.0 in | Wt 174.2 lb

## 2017-12-28 DIAGNOSIS — IMO0001 Reserved for inherently not codable concepts without codable children: Secondary | ICD-10-CM

## 2017-12-28 DIAGNOSIS — T6291XA Toxic effect of unspecified noxious substance eaten as food, accidental (unintentional), initial encounter: Secondary | ICD-10-CM

## 2017-12-28 MED ORDER — ONDANSETRON 4 MG PO TBDP: 4.0000 mg | ORAL_TABLET | Freq: Three times a day (TID) | ORAL | 0 refills | Status: DC | PRN

## 2017-12-28 NOTE — Progress Notes (Signed)
BP 123/84   Pulse 71   Temp 97.9 F (36.6 C) (Oral)   Ht 5\' 3"  (1.6 m)   Wt 174 lb 3.2 oz (79 kg)   SpO2 98%   BMI 30.86 kg/m    Subjective:    Patient ID: Brittany Ochoa, female    DOB: 07-10-1965, 52 y.o.   MRN: [id number]  HPI: Brittany Ochoa is a 52 y.o. female  Chief Complaint  Patient presents with  . Food Poison    pt states she got food poisoning yesterday from [place] called yesterday due to feeling badly with distended stomach, nausea, and diarrhea. No fever  Today she is better.    Relevant past medical, surgical, family and social history reviewed and updated as indicated. Interim medical history since our last visit reviewed. Allergies and medications reviewed and updated.  Review of Systems  Per HPI unless specifically indicated above     Objective:    BP 123/84   Pulse 71   Temp 97.9 F (36.6 C) (Oral)   Ht 5\' 3"  (1.6 m)   Wt 174 lb 3.2 oz (79 kg)   SpO2 98%   BMI 30.86 kg/m   Wt Readings from Last 3 Encounters:  12/28/17 174 lb 3.2 oz (79 kg)  12/24/17 179 lb (81.2 kg)  12/19/17 175 lb 9.6 oz (79.7 kg)    Physical Exam  Constitutional: She is oriented to person, place, and time. She appears well-developed and well-nourished. No distress.  HENT:  Head: Normocephalic and atraumatic.  Eyes: Conjunctivae and lids are normal. Right eye exhibits no discharge. Left eye exhibits no discharge. No scleral icterus.  Neck: Normal range of motion. Neck supple. No JVD present. Carotid bruit is not present.  Cardiovascular: Normal rate, regular rhythm and normal heart sounds.  Pulmonary/Chest: Effort normal and breath sounds normal.  Abdominal: Soft. Normal appearance. She exhibits no distension. There is no splenomegaly or hepatomegaly.  Mild tenderness  Musculoskeletal: Normal range of motion.  Neurological: She is alert and oriented to person, place, and time.  Skin: Skin is warm, dry and intact. No rash noted. No pallor.    Psychiatric: She has a normal mood and affect. Her behavior is normal. Judgment and thought content normal.    Results for orders placed or performed in visit on 09/28/17  Microscopic Examination  Result Value Ref Range   WBC, UA 0-5 0 - 5 /hpf   RBC, UA 0-2 0 - 2 /hpf   Epithelial Cells (non renal) 0-10 0 - 10 /hpf   Bacteria, UA None seen None seen/Few  Bayer DCA Hb A1c Waived  Result Value Ref Range   HB A1C (BAYER DCA - WAIVED) 5.3 <7.0 %  CBC with Differential/Platelet  Result Value Ref Range   WBC 7.1 3.4 - 10.8 x10E3/uL   RBC 4.55 3.77 - 5.28 x10E6/uL   Hemoglobin 14.0 11.1 - 15.9 g/dL   Hematocrit 41.5 34.0 - 46.6 %   MCV 91 79 - 97 fL   MCH 30.8 26.6 - 33.0 pg   MCHC 33.7 31.5 - 35.7 g/dL   RDW 14.1 12.3 - 15.4 %   Platelets 271 150 - 450 x10E3/uL   Neutrophils 70 Not Estab. %   Lymphs 23 Not Estab. %   Monocytes 5 Not Estab. %   Eos 2 Not Estab. %   Basos 0 Not Estab. %   Neutrophils Absolute 5.1 1.4 - 7.0 x10E3/uL   Lymphocytes Absolute 1.6  0.7 - 3.1 x10E3/uL   Monocytes Absolute 0.3 0.1 - 0.9 x10E3/uL   EOS (ABSOLUTE) 0.1 0.0 - 0.4 x10E3/uL   Basophils Absolute 0.0 0.0 - 0.2 x10E3/uL   Immature Granulocytes 0 Not Estab. %   Immature Grans (Abs) 0.0 0.0 - 0.1 x10E3/uL  Comprehensive metabolic panel  Result Value Ref Range   Glucose 84 65 - 99 mg/dL   BUN 15 6 - 24 mg/dL   Creatinine, Ser 0.80 0.57 - 1.00 mg/dL   GFR calc non Af Amer 86 >59 mL/min/1.73   GFR calc Af Amer 99 >59 mL/min/1.73   BUN/Creatinine Ratio 19 9 - 23   Sodium 140 134 - 144 mmol/L   Potassium 4.2 3.5 - 5.2 mmol/L   Chloride 100 96 - 106 mmol/L   CO2 25 20 - 29 mmol/L   Calcium 9.6 8.7 - 10.2 mg/dL   Total Protein 7.0 6.0 - 8.5 g/dL   Albumin 4.7 3.5 - 5.5 g/dL   Globulin, Total 2.3 1.5 - 4.5 g/dL   Albumin/Globulin Ratio 2.0 1.2 - 2.2   Bilirubin Total 0.3 0.0 - 1.2 mg/dL   Alkaline Phosphatase 91 39 - 117 IU/L   AST 22 0 - 40 IU/L   ALT 20 0 - 32 IU/L  Lipid Panel w/o Chol/HDL  Ratio  Result Value Ref Range   Cholesterol, Total 221 (H) 100 - 199 mg/dL   Triglycerides 135 0 - 149 mg/dL   HDL 56 >39 mg/dL   VLDL Cholesterol Cal 27 5 - 40 mg/dL   LDL Calculated 138 (H) 0 - 99 mg/dL  Microalbumin, Urine Waived  Result Value Ref Range   Microalb, Ur Waived 10 0 - 19 mg/L   Creatinine, Urine Waived 100 10 - 300 mg/dL   Microalb/Creat Ratio <30 <30 mg/g  TSH  Result Value Ref Range   TSH 0.536 0.450 - 4.500 uIU/mL  UA/M w/rflx Culture, Routine  Result Value Ref Range   Specific Gravity, UA 1.010 1.005 - 1.030   pH, UA 6.5 5.0 - 7.5   Color, UA Yellow Yellow   Appearance Ur Clear Clear   Leukocytes, UA Negative Negative   Protein, UA Negative Negative/Trace   Glucose, UA Negative Negative   Ketones, UA Negative Negative   RBC, UA 1+ (A) Negative   Bilirubin, UA Negative Negative   Urobilinogen, Ur 0.2 0.2 - 1.0 mg/dL   Nitrite, UA Negative Negative   Microscopic Examination See below:   HIV antibody  Result Value Ref Range   HIV Screen 4th Generation wRfx Non Reactive Non Reactive      Assessment & Plan:   Problem List Items Addressed This Visit    None    Visit Diagnoses    Food poisoning, accidental or unintentional, initial encounter    -  Primary   Pt is better today.  Recommended BRAT diet.  Nausea is better but will give Zofran as needed       Follow up plan: Return if symptoms worsen or fail to improve.

## 2017-12-31 ENCOUNTER — Telehealth: Payer: Self-pay

## 2017-12-31 NOTE — Telephone Encounter (Signed)
Patient contacted office to cancel her EGD scheduled for 01/02/18.  She states she plans on seeing her brothers ENT, but she has not scheduled appt  yet.  Thanks, [name]

## 2018-01-02 ENCOUNTER — Ambulatory Visit: Admission: RE | Admit: 2018-01-02 | Payer: 59 | Source: Ambulatory Visit | Admitting: Gastroenterology

## 2018-01-02 ENCOUNTER — Encounter: Admission: RE | Payer: Self-pay | Source: Ambulatory Visit

## 2018-01-02 SURGERY — ESOPHAGOGASTRODUODENOSCOPY (EGD) WITH PROPOFOL
Anesthesia: General

## 2018-01-08 ENCOUNTER — Encounter: Payer: Self-pay | Admitting: Psychology

## 2018-01-10 ENCOUNTER — Encounter: Payer: Self-pay | Admitting: Family Medicine

## 2018-01-10 ENCOUNTER — Ambulatory Visit: Payer: 59 | Admitting: Family Medicine

## 2018-01-10 VITALS — BP 135/86 | HR 84 | Wt 175.0 lb

## 2018-01-10 DIAGNOSIS — F422 Mixed obsessional thoughts and acts: Secondary | ICD-10-CM | POA: Diagnosis not present

## 2018-01-10 DIAGNOSIS — F429 Obsessive-compulsive disorder, unspecified: Secondary | ICD-10-CM | POA: Insufficient documentation

## 2018-01-10 MED ORDER — FLUOXETINE HCL 20 MG PO TABS: 20.0000 mg | ORAL_TABLET | Freq: Every day | ORAL | 3 refills | Status: DC

## 2018-01-10 NOTE — Assessment & Plan Note (Signed)
Will start low dose prozac and recheck in 1 month. Call with any concerns. Continue to monitor.

## 2018-01-10 NOTE — Progress Notes (Signed)
BP 135/86 (BP Location: Left Arm, Patient Position: Sitting, Cuff Size: Normal)   Pulse 84   Wt 175 lb (79.4 kg)   SpO2 97%   BMI 31.00 kg/m    Subjective:    Patient ID: Brittany Ochoa, female    DOB: 1965/12/25, 52 y.o.   MRN: [id number]  HPI: Brittany Ochoa is a 52 y.o. female  Chief Complaint  Patient presents with  . Anxiety   OCD- has had OCD since she was a child Duration: having more issues for the past month, has been getting worse in the past few years Anxious mood: no  Excessive worrying: no Irritability: no  Sweating: no Nausea: no Palpitations:no Hyperventilation: no Panic attacks: no Agoraphobia: no  Obscessions/compulsions: yes Depressed mood: no Depression screen [facility] 2/9 01/10/2018 09/28/2017 10/14/2015  Decreased Interest 0 1 0  Down, Depressed, Hopeless 0 1 0  PHQ - 2 Score 0 2 0  Altered sleeping 2 2 -  Tired, decreased energy 2 2 -  Change in appetite 3 2 -  Feeling bad or failure about yourself  0 1 -  Trouble concentrating 0 2 -  Moving slowly or fidgety/restless 0 0 -  Suicidal thoughts 0 0 -  PHQ-9 Score 7 11 -  Difficult doing work/chores Not difficult at all - -   Anhedonia: no Weight changes: no Insomnia: no   Hypersomnia: no Fatigue/loss of energy: no Feelings of worthlessness: no Feelings of guilt: no Impaired concentration/indecisiveness: no Suicidal ideations: no  Crying spells: no Recent Stressors/Life Changes: no   Relationship problems: no   Family stress: no     Financial stress: no    Job stress: no    Recent death/loss: no  Relevant past medical, surgical, family and social history reviewed and updated as indicated. Interim medical history since our last visit reviewed. Allergies and medications reviewed and updated.  Review of Systems  Constitutional: Negative.   Respiratory: Negative.   Cardiovascular: Negative.   Psychiatric/Behavioral: Negative for agitation, behavioral problems, confusion, decreased  concentration, dysphoric mood, hallucinations, self-injury, sleep disturbance and suicidal ideas. The patient is not nervous/anxious and is not hyperactive.     Per HPI unless specifically indicated above     Objective:    BP 135/86 (BP Location: Left Arm, Patient Position: Sitting, Cuff Size: Normal)   Pulse 84   Wt 175 lb (79.4 kg)   SpO2 97%   BMI 31.00 kg/m   Wt Readings from Last 3 Encounters:  01/10/18 175 lb (79.4 kg)  12/28/17 174 lb 3.2 oz (79 kg)  12/24/17 179 lb (81.2 kg)    Physical Exam  Constitutional: She is oriented to person, place, and time. She appears well-developed and well-nourished. No distress.  HENT:  Head: Normocephalic and atraumatic.  Right Ear: Hearing normal.  Left Ear: Hearing normal.  Nose: Nose normal.  Eyes: Conjunctivae and lids are normal. Right eye exhibits no discharge. Left eye exhibits no discharge. No scleral icterus.  Cardiovascular: Normal rate, regular rhythm, normal heart sounds and intact distal pulses. Exam reveals no gallop and no friction rub.  No murmur heard. Pulmonary/Chest: Effort normal and breath sounds normal. No stridor. No respiratory distress. She has no wheezes. She has no rales. She exhibits no tenderness.  Musculoskeletal: Normal range of motion.  Neurological: She is alert and oriented to person, place, and time.  Skin: Skin is warm, dry and intact. Capillary refill takes less than 2 seconds. No rash noted. She is not diaphoretic. No  erythema. No pallor.  Psychiatric: She has a normal mood and affect. Her speech is normal and behavior is normal. Judgment and thought content normal. Cognition and memory are normal.  Nursing note and vitals reviewed.   Results for orders placed or performed in visit on 09/28/17  Microscopic Examination  Result Value Ref Range   WBC, UA 0-5 0 - 5 /hpf   RBC, UA 0-2 0 - 2 /hpf   Epithelial Cells (non renal) 0-10 0 - 10 /hpf   Bacteria, UA None seen None seen/Few  Bayer DCA Hb A1c  Waived  Result Value Ref Range   HB A1C (BAYER DCA - WAIVED) 5.3 <7.0 %  CBC with Differential/Platelet  Result Value Ref Range   WBC 7.1 3.4 - 10.8 x10E3/uL   RBC 4.55 3.77 - 5.28 x10E6/uL   Hemoglobin 14.0 11.1 - 15.9 g/dL   Hematocrit 41.5 34.0 - 46.6 %   MCV 91 79 - 97 fL   MCH 30.8 26.6 - 33.0 pg   MCHC 33.7 31.5 - 35.7 g/dL   RDW 14.1 12.3 - 15.4 %   Platelets 271 150 - 450 x10E3/uL   Neutrophils 70 Not Estab. %   Lymphs 23 Not Estab. %   Monocytes 5 Not Estab. %   Eos 2 Not Estab. %   Basos 0 Not Estab. %   Neutrophils Absolute 5.1 1.4 - 7.0 x10E3/uL   Lymphocytes Absolute 1.6 0.7 - 3.1 x10E3/uL   Monocytes Absolute 0.3 0.1 - 0.9 x10E3/uL   EOS (ABSOLUTE) 0.1 0.0 - 0.4 x10E3/uL   Basophils Absolute 0.0 0.0 - 0.2 x10E3/uL   Immature Granulocytes 0 Not Estab. %   Immature Grans (Abs) 0.0 0.0 - 0.1 x10E3/uL  Comprehensive metabolic panel  Result Value Ref Range   Glucose 84 65 - 99 mg/dL   BUN 15 6 - 24 mg/dL   Creatinine, Ser 0.80 0.57 - 1.00 mg/dL   GFR calc non Af Amer 86 >59 mL/min/1.73   GFR calc Af Amer 99 >59 mL/min/1.73   BUN/Creatinine Ratio 19 9 - 23   Sodium 140 134 - 144 mmol/L   Potassium 4.2 3.5 - 5.2 mmol/L   Chloride 100 96 - 106 mmol/L   CO2 25 20 - 29 mmol/L   Calcium 9.6 8.7 - 10.2 mg/dL   Total Protein 7.0 6.0 - 8.5 g/dL   Albumin 4.7 3.5 - 5.5 g/dL   Globulin, Total 2.3 1.5 - 4.5 g/dL   Albumin/Globulin Ratio 2.0 1.2 - 2.2   Bilirubin Total 0.3 0.0 - 1.2 mg/dL   Alkaline Phosphatase 91 39 - 117 IU/L   AST 22 0 - 40 IU/L   ALT 20 0 - 32 IU/L  Lipid Panel w/o Chol/HDL Ratio  Result Value Ref Range   Cholesterol, Total 221 (H) 100 - 199 mg/dL   Triglycerides 135 0 - 149 mg/dL   HDL 56 >39 mg/dL   VLDL Cholesterol Cal 27 5 - 40 mg/dL   LDL Calculated 138 (H) 0 - 99 mg/dL  Microalbumin, Urine Waived  Result Value Ref Range   Microalb, Ur Waived 10 0 - 19 mg/L   Creatinine, Urine Waived 100 10 - 300 mg/dL   Microalb/Creat Ratio <30 <30 mg/g   TSH  Result Value Ref Range   TSH 0.536 0.450 - 4.500 uIU/mL  UA/M w/rflx Culture, Routine  Result Value Ref Range   Specific Gravity, UA 1.010 1.005 - 1.030   pH, UA 6.5 5.0 - 7.5  Color, UA Yellow Yellow   Appearance Ur Clear Clear   Leukocytes, UA Negative Negative   Protein, UA Negative Negative/Trace   Glucose, UA Negative Negative   Ketones, UA Negative Negative   RBC, UA 1+ (A) Negative   Bilirubin, UA Negative Negative   Urobilinogen, Ur 0.2 0.2 - 1.0 mg/dL   Nitrite, UA Negative Negative   Microscopic Examination See below:   HIV antibody  Result Value Ref Range   HIV Screen 4th Generation wRfx Non Reactive Non Reactive      Assessment & Plan:   Problem List Items Addressed This Visit      Other   OCD (obsessive compulsive disorder) - Primary    Will start low dose prozac and recheck in 1 month. Call with any concerns. Continue to monitor.       Relevant Medications   FLUoxetine (PROZAC) 20 MG tablet       Follow up plan: Return in about 4 weeks (around 02/07/2018) for Follow up .

## 2018-01-14 ENCOUNTER — Telehealth: Payer: Self-pay | Admitting: Gastroenterology

## 2018-01-14 NOTE — Telephone Encounter (Signed)
Patient called and would like to reschedule her upper endoscopy with Dr Bonna Gains. She is unable to do it the week of 02-04-18 Please call patient to set up.

## 2018-01-15 ENCOUNTER — Other Ambulatory Visit: Payer: Self-pay

## 2018-01-15 DIAGNOSIS — R1319 Other dysphagia: Secondary | ICD-10-CM

## 2018-01-15 DIAGNOSIS — R1310 Dysphagia, unspecified: Secondary | ICD-10-CM

## 2018-01-15 NOTE — Telephone Encounter (Signed)
Returned patients call LVM for her to call to speak with [name] or [name] to reschedule her procedure with Dr. Bonna Gains.  Patient called back.  EGD has been scheduled for 01/24/18 with Dr. Bonna Gains at [facility].

## 2018-01-15 NOTE — Telephone Encounter (Signed)
PT  Left vm to set up her upper endoscopy please call pt

## 2018-01-20 ENCOUNTER — Encounter: Payer: Self-pay | Admitting: Family Medicine

## 2018-01-22 MED ORDER — FLUOXETINE HCL 20 MG PO TABS: 10.0000 mg | ORAL_TABLET | Freq: Every day | ORAL | 3 refills | Status: DC

## 2018-01-23 ENCOUNTER — Encounter: Payer: Self-pay | Admitting: Anesthesiology

## 2018-01-24 ENCOUNTER — Ambulatory Visit: Payer: 59 | Admitting: Anesthesiology

## 2018-01-24 ENCOUNTER — Telehealth: Payer: Self-pay | Admitting: Family Medicine

## 2018-01-24 ENCOUNTER — Ambulatory Visit
Admission: RE | Admit: 2018-01-24 | Discharge: 2018-01-24 | Disposition: A | Discharge status: Home / Self Care | Payer: 59 | Source: Ambulatory Visit | Attending: Gastroenterology | Admitting: Gastroenterology

## 2018-01-24 ENCOUNTER — Encounter
Admission: RE | Disposition: A | Discharge status: Home / Self Care | Payer: Self-pay | Source: Ambulatory Visit | Attending: Gastroenterology

## 2018-01-24 ENCOUNTER — Encounter: Payer: Self-pay | Admitting: *Deleted

## 2018-01-24 DIAGNOSIS — Z87891 Personal history of nicotine dependence: Secondary | ICD-10-CM | POA: Insufficient documentation

## 2018-01-24 DIAGNOSIS — K3189 Other diseases of stomach and duodenum: Secondary | ICD-10-CM | POA: Diagnosis not present

## 2018-01-24 DIAGNOSIS — Z79899 Other long term (current) drug therapy: Secondary | ICD-10-CM | POA: Insufficient documentation

## 2018-01-24 DIAGNOSIS — F419 Anxiety disorder, unspecified: Secondary | ICD-10-CM | POA: Diagnosis not present

## 2018-01-24 DIAGNOSIS — R1310 Dysphagia, unspecified: Secondary | ICD-10-CM | POA: Diagnosis not present

## 2018-01-24 DIAGNOSIS — K2950 Unspecified chronic gastritis without bleeding: Secondary | ICD-10-CM | POA: Diagnosis not present

## 2018-01-24 DIAGNOSIS — K2990 Gastroduodenitis, unspecified, without bleeding: Secondary | ICD-10-CM | POA: Diagnosis not present

## 2018-01-24 DIAGNOSIS — E669 Obesity, unspecified: Secondary | ICD-10-CM | POA: Insufficient documentation

## 2018-01-24 DIAGNOSIS — Z6831 Body mass index (BMI) 31.0-31.9, adult: Secondary | ICD-10-CM | POA: Diagnosis not present

## 2018-01-24 DIAGNOSIS — R499 Unspecified voice and resonance disorder: Secondary | ICD-10-CM

## 2018-01-24 DIAGNOSIS — R1319 Other dysphagia: Secondary | ICD-10-CM

## 2018-01-24 DIAGNOSIS — J45909 Unspecified asthma, uncomplicated: Secondary | ICD-10-CM | POA: Diagnosis not present

## 2018-01-24 HISTORY — PX: ESOPHAGOGASTRODUODENOSCOPY (EGD) WITH PROPOFOL: SHX5813

## 2018-01-24 LAB — POCT PREGNANCY, URINE: Preg Test, Ur: NEGATIVE

## 2018-01-24 SURGERY — ESOPHAGOGASTRODUODENOSCOPY (EGD) WITH PROPOFOL
Anesthesia: General

## 2018-01-24 MED ORDER — SODIUM CHLORIDE 0.9 % IV SOLN: INTRAVENOUS | Status: DC

## 2018-01-24 MED ORDER — BUTAMBEN-TETRACAINE-BENZOCAINE 2-2-14 % EX AERO
INHALATION_SPRAY | CUTANEOUS | Status: DC | PRN
  Administered 2018-01-24: 2 via TOPICAL

## 2018-01-24 MED ORDER — PROPOFOL 500 MG/50ML IV EMUL
INTRAVENOUS | Status: AC
  Filled 2018-01-24: qty 50

## 2018-01-24 MED ORDER — FENTANYL CITRATE (PF) 250 MCG/5ML IJ SOLN
INTRAMUSCULAR | Status: AC
  Filled 2018-01-24: qty 5

## 2018-01-24 MED ORDER — MIDAZOLAM HCL 5 MG/5ML IJ SOLN
INTRAMUSCULAR | Status: AC
  Filled 2018-01-24: qty 5

## 2018-01-24 MED ORDER — PROPOFOL 500 MG/50ML IV EMUL
INTRAVENOUS | Status: DC | PRN
  Administered 2018-01-24: 120 ug/kg/min via INTRAVENOUS

## 2018-01-24 NOTE — Anesthesia Preprocedure Evaluation (Signed)
Anesthesia Evaluation  Patient identified by MRN, date of birth, ID band Patient awake    Reviewed: Allergy & Precautions, NPO status , Patient's Chart, lab work & pertinent test results, reviewed documented beta blocker date and time   Airway Mallampati: III  TM Distance: >3 FB     Dental  (+) Chipped   Pulmonary asthma , former smoker,           Cardiovascular      Neuro/Psych PSYCHIATRIC DISORDERS Anxiety    GI/Hepatic   Endo/Other    Renal/GU      Musculoskeletal   Abdominal   Peds  Hematology   Anesthesia Other Findings Obese. MR. AAA. CXR ok. EKG ok.  Reproductive/Obstetrics                             Anesthesia Physical Anesthesia Plan  ASA: III  Anesthesia Plan: General   Post-op Pain Management:    Induction: Intravenous  PONV Risk Score and Plan:   Airway Management Planned:   Additional Equipment:   Intra-op Plan:   Post-operative Plan:   Informed Consent: I have reviewed the patients History and Physical, chart, labs and discussed the procedure including the risks, benefits and alternatives for the proposed anesthesia with the patient or authorized representative who has indicated his/her understanding and acceptance.     Plan Discussed with: CRNA  Anesthesia Plan Comments:         Anesthesia Quick Evaluation

## 2018-01-24 NOTE — Telephone Encounter (Signed)
Referral in

## 2018-01-24 NOTE — Telephone Encounter (Signed)
-----   Message from [name], MD sent at 01/24/2018  8:16 AM EDT ----- Hello,  I am planning on doing this patient's EGD today. She states she has noticed some voice changes and would like to be evaluated by ENT. Would you mind referring her? Thank You.  Varnita

## 2018-01-24 NOTE — H&P (Addendum)
Brittany Antigua, MD [address], [place], [place], [place], [address] [address], [place], [place], [place], [address] Phone: [phone]  Fax: [phone]  Primary Care Physician:  [name], DO   Pre-Procedure History & Physical: HPI:  Brittany Ochoa is a 52 y.o. female is here for an EGD.   Past Medical History:  Diagnosis Date  . AAA (abdominal aortic aneurysm) ([place])   . Thyroid disease    thyroid nodules    Past Surgical History:  Procedure Laterality Date  . BREAST REDUCTION SURGERY  1998  . lipoma removal  2007   back    Prior to Admission medications   Medication Sig Start Date End Date Taking? Authorizing Provider  FLUoxetine (PROZAC) 20 MG tablet Take 0.5 tablets (10 mg total) by mouth daily. 01/22/18  Yes Johnson, Megan P, DO  FLUoxetine (PROZAC) 20 MG tablet Take 1 tablet (20 mg total) by mouth daily. Patient not taking: Reported on 01/24/2018 01/10/18   Park Liter P, DO    Allergies as of 01/16/2018 - Review Complete 01/10/2018  Allergen Reaction Noted  . Codeine  08/18/2015    Family History  Problem Relation Age of Onset  . Diabetes Mother   . Diabetes Father   . Heart disease Father   . Heart attack Father   . Cancer Father   . Cancer Paternal Grandmother   . Heart disease Paternal Grandmother   . Cancer Paternal Grandfather   . Heart disease Paternal Grandfather   . Osteoporosis Maternal Grandmother     Social History   Socioeconomic History  . Marital status: Single    Spouse name: Not on file  . Number of children: Not on file  . Years of education: Not on file  . Highest education level: Not on file  Occupational History  . Not on file  Social Needs  . Financial resource strain: Not on file  . Food insecurity:    Worry: Not on file    Inability: Not on file  . Transportation needs:    Medical: Not on file    Non-medical: Not on file  Tobacco Use  . Smoking status: Former Smoker    Packs/day: 1.00   Years: 25.00    Pack years: 25.00    Last attempt to quit: 04/17/2004    Years since quitting: 13.7  . Smokeless tobacco: Never Used  Substance and Sexual Activity  . Alcohol use: Yes    Alcohol/week: 0.0 standard drinks    Comment: social  . Drug use: No    Comment: past only  . Sexual activity: Not Currently  Lifestyle  . Physical activity:    Days per week: Not on file    Minutes per session: Not on file  . Stress: Not on file  Relationships  . Social connections:    Talks on phone: Not on file    Gets together: Not on file    Attends religious service: Not on file    Active member of club or organization: Not on file    Attends meetings of clubs or organizations: Not on file    Relationship status: Not on file  . Intimate partner violence:    Fear of current or ex partner: Not on file    Emotionally abused: Not on file    Physically abused: Not on file    Forced sexual activity: Not on file  Other Topics Concern  . Not on file  Social History Narrative  .  Not on file    Review of Systems: See HPI, otherwise negative ROS  Physical Exam: BP (!) 129/100   Pulse 61   Temp (!) 96.3 F (35.7 C) (Tympanic)   Resp 18   Ht 5\' 3"  (1.6 m)   Wt 79.4 kg   LMP  (LMP Unknown)   SpO2 100%   BMI 31.01 kg/m  General:   Alert,  pleasant and cooperative in NAD Head:  Normocephalic and atraumatic. Neck:  Supple; no masses or thyromegaly. Lungs:  Clear throughout to auscultation, normal respiratory effort.    Heart:  +S1, +S2, Regular rate and rhythm, No edema. Abdomen:  Soft, nontender and nondistended. Normal bowel sounds, without guarding, and without rebound.   Neurologic:  Alert and  oriented x4;  grossly normal neurologically.  Impression/Plan: Brittany Ochoa is here for an EGD for dysphagia.  The Nurse notified me that pt plans on going to work after her procedure today. We have advised her against going to work today. She was informed that the medication may  still be in her system after the procedure and we are not responsible for any events that occur if she chooses to go to work. This conversation took place before the procedure and before any sedation and she verbalized understanding.   Risks, benefits, limitations, and alternatives regarding the procedure have been reviewed with the patient.  Questions have been answered.  All parties agreeable.   [name], MD  01/24/2018, 8:16 AM

## 2018-01-24 NOTE — Anesthesia Post-op Follow-up Note (Signed)
Anesthesia QCDR form completed.        

## 2018-01-24 NOTE — Progress Notes (Signed)
It was instructed to the patient that she is not to work, drive, make critical decisions or do any activities that requires coordination or balance for 24 hours following anesthesia.  Patient stated her intent to work this morning following her procedure despite being instructed by myself, Dr Marcello Moores (anesthesia), and Dr Bonna Gains that it is recommended that she not work for 24 hours following her procedure and the administration of anesthesia.  Upon discharge, she was being taken out by wheelchair by our volunteer Belenda Cruise; once they reached the surgical waiting area on the 2nd floor, the patient got out of the wheelchair and refused to be taken any further, stating, "I will walk to the [facility], where I work and where my belongings are."

## 2018-01-24 NOTE — Transfer of Care (Signed)
Immediate Anesthesia Transfer of Care Note  Patient: Brittany Ochoa  Procedure(s) Performed: ESOPHAGOGASTRODUODENOSCOPY (EGD) WITH PROPOFOL (N/A )  Patient Location: PACU and Endoscopy Unit  Anesthesia Type:General  Level of Consciousness: awake and alert   Airway & Oxygen Therapy: Patient Spontanous Breathing  Post-op Assessment: Report given to RN  Post vital signs: Reviewed  Last Vitals:  Vitals Value Taken Time  BP    Temp    Pulse    Resp    SpO2      Last Pain:  Vitals:   01/24/18 0726  TempSrc: Tympanic  PainSc: 0-No pain         Complications: No apparent anesthesia complications

## 2018-01-24 NOTE — Op Note (Signed)
[facility] Gastroenterology Patient Name: Brittany Ochoa Procedure Date: 01/24/2018 7:59 AM MRN: [id number] Account #: [id number] Date of Birth: 16-Apr-1966 Admit Type: Outpatient Age: 52 Room: [facility] ENDO ROOM 3 Gender: Female Note Status: Finalized Procedure:            Upper GI endoscopy Indications:          Dysphagia Providers:            Oluwatobiloba Martin B. Bonna Gains MD, MD Referring MD:         [name] (Referring MD) Medicines:            Monitored Anesthesia Care Complications:        No immediate complications. Procedure:            Pre-Anesthesia Assessment:                       - Prior to the procedure, a History and Physical was                        performed, and patient medications, allergies and                        sensitivities were reviewed. The patient's tolerance of                        previous anesthesia was reviewed.                       - The risks and benefits of the procedure and the                        sedation options and risks were discussed with the                        patient. All questions were answered and informed                        consent was obtained.                       - Patient identification and proposed procedure were                        verified prior to the procedure by the physician, the                        nurse, the anesthesiologist, the anesthetist and the                        technician. The procedure was verified in the procedure                        room.                       - ASA Grade Assessment: II - A patient with mild                        systemic disease.                       After obtaining  informed consent, the endoscope was                        passed under direct vision. Throughout the procedure,                        the patient's blood pressure, pulse, and oxygen                        saturations were monitored continuously. The Endoscope                        was  introduced through the mouth, and advanced to the                        second part of duodenum. The upper GI endoscopy was                        accomplished with ease. The patient tolerated the                        procedure well. Findings:      The examined esophagus was normal. Biopsies were obtained from the       proximal and distal esophagus with cold forceps for histology of       suspected eosinophilic esophagitis.      Patchy mildly erythematous mucosa without bleeding was found in the       gastric antrum. Biopsies were taken with a cold forceps for histology.       Biopsies were obtained in the gastric body, at the incisura and in the       gastric antrum with cold forceps for histology.      The duodenal bulb, second portion of the duodenum and examined duodenum       were normal. Impression:           - Normal esophagus. Biopsied.                       - Erythematous mucosa in the antrum. Biopsied.                       - Normal duodenal bulb, second portion of the duodenum                        and examined duodenum.                       - Biopsies were obtained in the gastric body, at the                        incisura and in the gastric antrum. Recommendation:       - Await pathology results.                       - Discharge patient to home (with escort).                       - Advance diet as tolerated.                       - Continue present medications.                       -  Patient has a contact number available for                        emergencies. The signs and symptoms of potential                        delayed complications were discussed with the patient.                        Return to normal activities tomorrow. Written discharge                        instructions were provided to the patient.                       - Discharge patient to home (with escort).                       - The findings and recommendations were discussed with                         the patient.                       - The findings and recommendations were discussed with                        the patient's family. Procedure Code(s):    --- Professional ---                       [phone], Esophagogastroduodenoscopy, flexible, transoral;                        with biopsy, single or multiple Diagnosis Code(s):    --- Professional ---                       K31.89, Other diseases of stomach and duodenum                       R13.10, Dysphagia, unspecified CPT copyright 2018 American Medical Association. All rights reserved. The codes documented in this report are preliminary and upon coder review may  be revised to meet current compliance requirements.  [name], MD Margretta Sidle B. Bonna Gains MD, MD 01/24/2018 8:41:59 AM This report has been signed electronically. Number of Addenda: 0 Note Initiated On: 01/24/2018 7:59 AM Estimated Blood Loss: Estimated blood loss: none.      [facility]

## 2018-01-24 NOTE — Anesthesia Postprocedure Evaluation (Signed)
Anesthesia Post Note  Patient: Brittany Ochoa  Procedure(s) Performed: ESOPHAGOGASTRODUODENOSCOPY (EGD) WITH PROPOFOL (N/A )  Patient location during evaluation: Endoscopy Anesthesia Type: General Level of consciousness: awake and alert Pain management: pain level controlled Vital Signs Assessment: post-procedure vital signs reviewed and stable Respiratory status: spontaneous breathing, nonlabored ventilation, respiratory function stable and patient connected to nasal cannula oxygen Cardiovascular status: blood pressure returned to baseline and stable Postop Assessment: no apparent nausea or vomiting Anesthetic complications: no     Last Vitals:  Vitals:   01/24/18 0857 01/24/18 0907  BP: 130/83 128/78  Pulse: 67 63  Resp: 17 16  Temp:    SpO2: 98% 98%    Last Pain:  Vitals:   01/24/18 0907  TempSrc:   PainSc: 0-No pain                 Lachlan Mckim S

## 2018-01-25 ENCOUNTER — Encounter: Payer: Self-pay | Admitting: Gastroenterology

## 2018-01-25 LAB — SURGICAL PATHOLOGY

## 2018-02-12 ENCOUNTER — Telehealth: Payer: Self-pay

## 2018-02-12 NOTE — Telephone Encounter (Signed)
-----   Message from [name], MD sent at 01/25/2018  3:13 PM EDT ----- [name] please let patient know, her biopsy results were benign. If she is still having problems swallowing we can discuss PPI therapy or manometry in clinic. She also needs a screening colonoscopy. Have her follow up in clinic next available.

## 2018-02-12 NOTE — Telephone Encounter (Signed)
Left message to check my chart for results and Dr. Michele Mcalpine suggestions in her note. To contact office for further questions if needed.

## 2018-02-28 ENCOUNTER — Encounter: Payer: Self-pay | Admitting: Family Medicine

## 2018-03-12 ENCOUNTER — Encounter: Payer: Self-pay | Admitting: Family Medicine

## 2018-04-02 DIAGNOSIS — M25511 Pain in right shoulder: Secondary | ICD-10-CM | POA: Diagnosis not present

## 2018-05-16 DIAGNOSIS — D229 Melanocytic nevi, unspecified: Secondary | ICD-10-CM | POA: Diagnosis not present

## 2018-05-16 DIAGNOSIS — L720 Epidermal cyst: Secondary | ICD-10-CM | POA: Diagnosis not present

## 2018-05-16 DIAGNOSIS — L570 Actinic keratosis: Secondary | ICD-10-CM | POA: Diagnosis not present

## 2018-05-17 ENCOUNTER — Other Ambulatory Visit: Payer: Self-pay | Admitting: Surgery

## 2018-05-17 DIAGNOSIS — I712 Thoracic aortic aneurysm, without rupture, unspecified: Secondary | ICD-10-CM

## 2018-06-04 ENCOUNTER — Telehealth: Payer: Self-pay | Admitting: Family Medicine

## 2018-06-04 NOTE — Telephone Encounter (Signed)
Patient scheduled w/ Mrs. Rudene Anda for Friday. Will forward message to her as FYI.

## 2018-06-04 NOTE — Telephone Encounter (Signed)
Copied from [place] [id number]. Topic: Appointment Scheduling - Scheduling Inquiry for Clinic >> Jun 04, 2018  9:25 AM Vernona Rieger wrote: Reason for CRM: Patient is requesting to have her thyroid checked because she is gaining 5-7 pounds a week. Patient is upset and crying because she can not do this anymore. She said she eats clean and healthy and does not understand all the weight gain. She is aware Dr Wynetta Emery is out of the office and this has to be sent for approval. She would like to come in and do a lab visit and would like another provider to review this. Please Advise.

## 2018-06-04 NOTE — Telephone Encounter (Signed)
Please schedule patient for appointment. Has not been seen since September and thyroid normal in June.

## 2018-06-07 ENCOUNTER — Ambulatory Visit: Payer: 59 | Admitting: Family Medicine

## 2018-06-10 ENCOUNTER — Other Ambulatory Visit: Payer: Self-pay | Admitting: Family Medicine

## 2018-06-10 DIAGNOSIS — Z1231 Encounter for screening mammogram for malignant neoplasm of breast: Secondary | ICD-10-CM

## 2018-06-14 ENCOUNTER — Encounter: Payer: Self-pay | Admitting: Family Medicine

## 2018-06-14 ENCOUNTER — Ambulatory Visit: Payer: 59 | Admitting: Family Medicine

## 2018-06-14 VITALS — BP 145/83 | HR 80 | Temp 98.3°F | Wt 194.3 lb

## 2018-06-14 DIAGNOSIS — R635 Abnormal weight gain: Secondary | ICD-10-CM

## 2018-06-14 NOTE — Progress Notes (Signed)
BP (!) 145/83 (BP Location: Left Arm, Cuff Size: Normal)   Pulse 80   Temp 98.3 F (36.8 C) (Oral)   Wt 194 lb 4.8 oz (88.1 kg)   SpO2 97%   BMI 34.42 kg/m    Subjective:    Patient ID: Brittany Ochoa, female    DOB: May 02, 1965, 53 y.o.   MRN: [id number]  HPI: Brittany Ochoa is a 53 y.o. female  Chief Complaint  Patient presents with  . Weight Gain    pt states she has been gainging a lot of weight recently, states she thinks it is her thyroid- states she has nodules on her thyroid, requesting lab work- has list that she would like checked    Persistent weight gain despite eating very healthy. Has typically been in the 170 lb range and now 195 lb. Starting back at the gym next week. States she eats very clean, no sweets or processed foods and only eats two meals per day. Not taking any medications, stress levels unchanged from typical, sleeping as well as she ever has. Hx of thyroid nodules and wanting further lab testing (has a list with her) despite several normal TSH levels the past year.   Relevant past medical, surgical, family and social history reviewed and updated as indicated. Interim medical history since our last visit reviewed. Allergies and medications reviewed and updated.  Review of Systems  Per HPI unless specifically indicated above     Objective:    BP (!) 145/83 (BP Location: Left Arm, Cuff Size: Normal)   Pulse 80   Temp 98.3 F (36.8 C) (Oral)   Wt 194 lb 4.8 oz (88.1 kg)   SpO2 97%   BMI 34.42 kg/m   Wt Readings from Last 3 Encounters:  06/14/18 194 lb 4.8 oz (88.1 kg)  01/24/18 175 lb 0.7 oz (79.4 kg)  01/10/18 175 lb (79.4 kg)    Physical Exam Vitals signs and nursing note reviewed.  Constitutional:      Appearance: Normal appearance. She is not ill-appearing.  HENT:     Head: Atraumatic.  Eyes:     Extraocular Movements: Extraocular movements intact.     Conjunctiva/sclera: Conjunctivae normal.  Neck:     Musculoskeletal:  Normal range of motion and neck supple.     Thyroid: No thyroid mass, thyromegaly or thyroid tenderness.  Cardiovascular:     Rate and Rhythm: Normal rate and regular rhythm.     Heart sounds: Normal heart sounds.  Pulmonary:     Effort: Pulmonary effort is normal.     Breath sounds: Normal breath sounds.  Musculoskeletal: Normal range of motion.  Skin:    General: Skin is warm and dry.  Neurological:     Mental Status: She is alert and oriented to person, place, and time.  Psychiatric:        Mood and Affect: Mood normal.        Thought Content: Thought content normal.        Judgment: Judgment normal.     Results for orders placed or performed in visit on 06/14/18  Calcitonin  Result Value Ref Range   Calcitonin <2.0 0.0 - 5.0 pg/mL  Thyroglobulin antibody  Result Value Ref Range   Thyroglobulin Antibody <1.0 0.0 - 0.9 IU/mL  Thyroid peroxidase antibody  Result Value Ref Range   Thyroperoxidase Ab SerPl-aCnc 12 0 - 34 IU/mL  Thyroglobulin Level  Result Value Ref Range   Thyroglobulin (TG-RIA) WILL FOLLOW   T4,  free  Result Value Ref Range   Free T4 1.14 0.82 - 1.77 ng/dL  T3, free  Result Value Ref Range   T3, Free 2.9 2.0 - 4.4 pg/mL      Assessment & Plan:   Problem List Items Addressed This Visit    None    Visit Diagnoses    Weight gain    -  Primary   Will await labs she's requested. Discussed increasing activity level, continued excellent diet. Does not want to pursue weight loss medication   Relevant Orders   Calcitonin (Completed)   Thyroglobulin antibody (Completed)   Thyroid peroxidase antibody (Completed)   Thyroglobulin Level (Completed)   T4, free (Completed)   T3, free (Completed)       Follow up plan: Return if symptoms worsen or fail to improve.

## 2018-06-20 LAB — CALCITONIN: Calcitonin: <2 pg/mL (ref 0.0–5.0)

## 2018-06-20 LAB — THYROGLOBULIN LEVEL: Thyroglobulin (TG-RIA): 46 ng/mL — ABNORMAL HIGH

## 2018-06-20 LAB — THYROID PEROXIDASE ANTIBODY: Thyroperoxidase Ab SerPl-aCnc: 12 IU/mL (ref 0–34)

## 2018-06-20 LAB — T3, FREE: T3, Free: 2.9 pg/mL (ref 2.0–4.4)

## 2018-06-20 LAB — THYROGLOBULIN ANTIBODY: Thyroglobulin Antibody: <1 IU/mL (ref 0.0–0.9)

## 2018-06-20 LAB — T4, FREE: Free T4: 1.14 ng/dL (ref 0.82–1.77)

## 2018-07-03 ENCOUNTER — Other Ambulatory Visit: Payer: Self-pay

## 2018-07-03 ENCOUNTER — Ambulatory Visit
Admission: RE | Admit: 2018-07-03 | Discharge: 2018-07-03 | Disposition: A | Discharge status: Home / Self Care | Payer: 59 | Source: Ambulatory Visit | Attending: Surgery | Admitting: Surgery

## 2018-07-03 ENCOUNTER — Ambulatory Visit: Payer: Self-pay | Admitting: Surgery

## 2018-07-03 DIAGNOSIS — I712 Thoracic aortic aneurysm, without rupture, unspecified: Secondary | ICD-10-CM

## 2018-07-03 MED ORDER — IOPAMIDOL (ISOVUE-370) INJECTION 76%
75.0000 mL | Freq: Once | INTRAVENOUS | Status: AC | PRN
  Administered 2018-07-03: 75 mL via INTRAVENOUS

## 2018-07-10 ENCOUNTER — Ambulatory Visit: Payer: Self-pay | Admitting: Surgery

## 2018-07-19 ENCOUNTER — Ambulatory Visit: Payer: 59 | Admitting: Family Medicine

## 2018-07-22 ENCOUNTER — Telehealth: Payer: Self-pay | Admitting: Family Medicine

## 2018-07-22 ENCOUNTER — Other Ambulatory Visit: Payer: Self-pay

## 2018-07-22 ENCOUNTER — Ambulatory Visit (INDEPENDENT_AMBULATORY_CARE_PROVIDER_SITE_OTHER): Payer: Self-pay | Admitting: Physician Assistant

## 2018-07-22 VITALS — BP 124/82 | HR 86 | Temp 98.0°F | Resp 16 | Ht 63.0 in | Wt 176.8 lb

## 2018-07-22 DIAGNOSIS — R059 Cough, unspecified: Secondary | ICD-10-CM

## 2018-07-22 DIAGNOSIS — J0100 Acute maxillary sinusitis, unspecified: Secondary | ICD-10-CM

## 2018-07-22 DIAGNOSIS — R0602 Shortness of breath: Secondary | ICD-10-CM

## 2018-07-22 DIAGNOSIS — H6523 Chronic serous otitis media, bilateral: Secondary | ICD-10-CM

## 2018-07-22 DIAGNOSIS — R05 Cough: Secondary | ICD-10-CM

## 2018-07-22 MED ORDER — ALBUTEROL SULFATE HFA 108 (90 BASE) MCG/ACT IN AERS: 2.0000 | INHALATION_SPRAY | RESPIRATORY_TRACT | 0 refills | Status: DC | PRN

## 2018-07-22 MED ORDER — FLUTICASONE PROPIONATE 50 MCG/ACT NA SUSP: 2.0000 | Freq: Every day | NASAL | 6 refills | Status: DC

## 2018-07-22 MED ORDER — AMOXICILLIN-POT CLAVULANATE 875-125 MG PO TABS: 1.0000 | ORAL_TABLET | Freq: Two times a day (BID) | ORAL | 0 refills | Status: AC

## 2018-07-22 NOTE — Telephone Encounter (Signed)
Copied from [place]. Topic: Quick Communication - Rx Refill/Question >> Jul 22, 2018  8:05 AM Leward Quan A wrote: Medication: clarithromycin (BIAXIN) 500 MG tablet, fluticasone (FLONASE) 50 MCG/ACT nasal spray   Has the patient contacted their pharmacy? Yes.   (Agent: If no, request that the patient contact the pharmacy for the refill.) (Agent: If yes, when and what did the pharmacy advise?)  Preferred Pharmacy (with phone number or street name): [id number] - [place], [address] [phone] (Phone) [phone] (Fax)    Agent: Please be advised that RX refills may take up to 3 business days. We ask that you follow-up with your pharmacy.

## 2018-07-22 NOTE — Progress Notes (Signed)
Patient ID: Brittany Ochoa Vought DOB: November 05, 1965 AGE: 53 y.o. MRN: [id number]   PCP: [name], DO   Chief Complaint:  Chief Complaint  Patient presents with  . Sinus Problem    x 1 month, OTC Mucinex     Subjective:    HPI:  Brittany Ochoa is a 53 y.o. female presents for evaluation  Chief Complaint  Patient presents with  . Sinus Problem    x 1 month, OTC Mucinex    53 year old female presents to [facility] with 6 week history of sinus symptoms. Reports nasal congestion, bilateral maxillary sinus pressure/congestion, and bilateral temporal headache. Per patient's normal during the Spring season. Approximately 2-4 weeks ago developed bilateral ear fullness, crackling/popping, fluid filled sensation. 1-2 weeks ago developed cough. Coarse. Nonproductive. Associated crackling/popping in chest sensation. Associated shortness of breath; however, patient has had SOB for 1 year, worse with exercising, minimal relief with albuterol inhaler, PCP in the process of referring patient to pulmonologist for further evaluation. Past few days has had crusted discharge of eyes bilaterally in the morning. Has been taking Mucinex and Advil Cold & Sinus with no improvement. Patient denies fever, chills, sweats, body aches, dizziness/lightheadedness, ear pain, tinnitus, sore throat, rhinorrhea, sneezing, chest pain, wheezing, palpitations. Patient denies eye injury/trauma, change in vision, foreign body sensation, photosensitivity, pain with eye movement. Patient is a former cigarette smoker; quit 14 years ago.  Patient called PCP's office earlier today requesting refill of Flonase and Biaxin for seasonal allergies (sinus pressure/drainage, headache, and chest discomfort). Patient has appointment scheduled with PCP's office for Friday, 07/26/2018.  Patient regularly followed by cardiologist, Dr. Kathlyn Sacramento MD, and cardiothoracic surgeon, Dr. Gilford Raid MD, for management of AAA.  Recently seen. States she has had cardiac stress tests and echocardiogram; denies SOB due to cardiac etiology.  Patient regularly followed by Dr. Park Liter DO with [facility]. Patient managed by AAA, prediabetes (04/29/2017) and thyroid disease. Last A1C 5.3 on 09/28/17 (nine months ago).  Patient works as a Designer, multimedia at [employer]. Has been wearing a mask at work. Denies known Covid19 exposure. No recent travel.   Patient last seen at [facility] on 12/24/2017 for sinusitis. Prescribed 10-day course of Augmentin and Flonase nasal spray. Symptoms resolved.  A limited review of symptoms was performed, pertinent positives and negatives as mentioned in HPI.  The following portions of the patient's history were reviewed and updated as appropriate: allergies, current medications and past medical history.  Patient Active Problem List   Diagnosis Date Noted  . Esophageal dysphagia   . Stomach irritation   . OCD (obsessive compulsive disorder) 01/10/2018  . Exercise-induced asthma 10/26/2017  . Dizziness 07/16/2017  . Multiple thyroid nodules 07/15/2017  . Mitral regurgitation 07/15/2017  . History of radiation exposure 07/15/2017  . Elevated BP without diagnosis of hypertension 05/04/2017  . Ascending aortic aneurysm ([place]) 05/04/2017  . 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 Codeine     Current Outpatient Medications on File Prior to Visit  Medication Sig Dispense Refill  . fluticasone (FLONASE) 50 MCG/ACT nasal spray Place 2 sprays into both nostrils daily. (Patient not taking: Reported on 07/22/2018) 16 g 6   No current facility-administered medications on file prior to visit.        Objective:   Vitals:   07/22/18 1542  BP: 124/82  Pulse: 86  Resp: 16  Temp: 98 F (  36.7 C)  SpO2: 96%     Wt Readings from Last 3 Encounters:  07/22/18 176 lb 12.8 oz (80.2 kg)  06/14/18 194 lb 4.8 oz (88.1 kg)   01/24/18 175 lb 0.7 oz (79.4 kg)    Physical Exam:   General Appearance:  Patient sitting comfortably on examination table. Conversational. Kermit Balo self-historian. In no acute distress. Afebrile.   Head:  Normocephalic, without obvious abnormality, atraumatic  Eyes:  PERRL, conjunctiva/corneas clear. No conjunctival erythema or injection. No chemosis. No perilimbal flushing. No allergic shiners. EOM's intact  Ears:  Left ear canal WNL. No erythema or edema. No open wound. No visible purulent drainage. No tenderness with palpation over left tragus or with manipulation of left auricle. No visible erythema or edema of left mastoid. No tenderness with palpation over left mastoid. Right ear canal WNL. No erythema or edema. No open wound. No visible purulent drainage. No tenderness with palpation over right tragus or with manipulation of right auricle. No visible erythema or edema of right mastoid. No tenderness with palpation over right mastoid. Left TM: Good light reflex. Visible landmarks. No erythema. No injection. No visible perforation. Minimal serous effusion, mild bulging at inferior aspect. No visible purulent effusion. No tympanostomy tube. No scar tissue. Right TM: Good light reflex. Visible landmarks. No erythema. No injection. No visible perforation. Moderate serous effusion, moderate bulging. No visible purulent effusion. No tympanostomy tube. No scar tissue.  Nose: Nares normal. Septum midline. No visible polyps. No discharge. Normal mucosa. Subjective sinus pressure/congestion over frontal and maxillary sinuses; no tenderness with palpation or percussion.  Throat: Lips, mucosa, and tongue normal; teeth and gums normal. Throat reveals no erythema. No postnasal drip. No visible cobblestoning. Tonsils with no enlargement or exudate. Uvula midline with no edema or erythema.  Neck: Supple, symmetrical, trachea midline, no adenopathy  Lungs:   Clear to auscultation bilaterally, respirations  unlabored. Good aeration. No rales, rhonchi, crackles or wheezing.  Heart:  Regular rate and rhythm, S1 and S2 normal, no murmur, rub, or gallop  Extremities: Extremities normal, atraumatic, no cyanosis or edema  Pulses: 2+ and symmetric  Skin: Skin color, texture, turgor normal, no rashes or lesions  Lymph nodes: Cervical, supraclavicular, and axillary nodes normal  Neurologic: Normal    Assessment & Plan:    Exam findings, diagnosis etiology and medication use and indications reviewed with patient. Follow-Up and discharge instructions provided. No emergent/urgent issues found on exam.  Patient education was provided.   Patient verbalized understanding of information provided and agrees with plan of care (POC), all questions answered. The patient is advised to call or return to clinic if condition does not see an improvement in symptoms, or to seek the care of the closest emergency department if condition worsens with the below plan.    1. Acute non-recurrent maxillary sinusitis - amoxicillin-clavulanate (AUGMENTIN) 875-125 MG tablet; Take 1 tablet by mouth 2 (two) times daily for 7 days.  Dispense: 14 tablet; Refill: 0  2. Bilateral chronic serous otitis media - fluticasone (FLONASE) 50 MCG/ACT nasal spray; Place 2 sprays into both nostrils daily.  Dispense: 16 g; Refill: 6  3. Cough - albuterol (PROVENTIL HFA;VENTOLIN HFA) 108 (90 Base) MCG/ACT inhaler; Inhale 2 puffs into the lungs every 4 (four) hours as needed.  Dispense: 1 Inhaler; Refill: 0  4. Shortness of breath  Patient with 6 week history of seasonal allergies/URI symptoms, has since developed worsening bilateral frontal/maxillary sinus pressure/congestion. Associated cough. Due to duration of symptoms, will treat patient for  bacterial sinusitis. Prescribed 1-week course of Augmentin. Prescribed Flonase for sinusitis and bilateral serous otitis media. Advised patient use over the counter antihistamine-decongestant combo.  Prescribed albuterol inhaler for cough. Patient's VSS, afebrile, in no acute distress, clear lung sounds. Low suspicion for Covid19 given no known exposure, no travel, and duration of symptoms (as well as primarily sinus involvement). Patient has appointment for virtual visit with PCP this Friday 07/26/18, four days from now. Advised patient keep appointment. Can discuss progression of symptoms. May also discuss pulmonology referral. Advised patient go to PCP's office, urgent care, or ED in 3 days if symptoms not improving; sooner with any worsening symptoms. Patient given work excuse note for today and tomorrow; advised patient call [employer] to discuss return to work. Patient agrees with plan.   Darlin Priestly, MHS, PA-C Montey Hora, MHS, PA-C Advanced Practice Provider [facility]  [address] [place], [place] [address] (p): [phone] [email or web address]

## 2018-07-22 NOTE — Telephone Encounter (Signed)
Copied from [place] [id number]. Topic: General - Other >> Jul 22, 2018  8:07 AM Jodie Echevaria wrote: Reason for CRM: Patient called to say that she is having lots of issues with allergies asking can she get Rx sent to pharmacy for medication requested above or does she need to do a virtual visit. Patient would like a call back since she is home today, has sinus drainage, pounding headache, chest discomfort all from allergies. Please advise Ph# [phone]

## 2018-07-22 NOTE — Telephone Encounter (Signed)
Will not refill clarithromycin without virtual visit.

## 2018-07-22 NOTE — Telephone Encounter (Signed)
Please get patient scheduled.  °

## 2018-07-22 NOTE — Telephone Encounter (Signed)
Called to inquire if/when someone would be calling with the appointment information. Explained PCP would be calling with information regarding a virtual visit to await call. Understood.

## 2018-07-22 NOTE — Patient Instructions (Addendum)
Thank you for choosing [facility] for your health care needs.  You have been diagnosed with sinusitis and cough.  Take medications as prescribed: Meds ordered this encounter  Medications  . fluticasone (FLONASE) 50 MCG/ACT nasal spray    Sig: Place 2 sprays into both nostrils daily.    Dispense:  16 g    Refill:  6    Order Specific Question:   Supervising Provider    Answer:   MILLER, BRIAN [[id number]]  . amoxicillin-clavulanate (AUGMENTIN) 875-125 MG tablet    Sig: Take 1 tablet by mouth 2 (two) times daily for 7 days.    Dispense:  14 tablet    Refill:  0    Order Specific Question:   Supervising Provider    Answer:   MILLER, BRIAN [[id number]]  . albuterol (PROVENTIL HFA;VENTOLIN HFA) 108 (90 Base) MCG/ACT inhaler    Sig: Inhale 2 puffs into the lungs every 4 (four) hours as needed.    Dispense:  1 Inhaler    Refill:  0    Order Specific Question:   Supervising Provider    Answer:   MILLER, BRIAN [[id number]]   Recommend increase fluids; water, Gatorade, hot tea with lemon/honey. Rest.  Continue to use steroid nasal spray, Flonase. May also use over the counter saline nasal spray and/or NettiPot. May apply warm compresses/heating pad over sinuses, may help with pain/discomfort.  Take an over the counter antihistamine with a decongestant, such as Allegra-D. Take over the counter Tylenol or ibuprofen for pain/discomfort.  Call [facility] to discuss return to work policy.  Follow-up with family physician or urgent care in 3 days if symptoms not improving. Sooner with any worsening symptoms.  Recommend you keep virtual appointment with PCP scheduled for this Friday 07/26/2018. Recommend you follow-up with pulmonologist for further evaluation of shortness of breath, as planned.   Sinusitis, Adult Sinusitis is soreness and swelling (inflammation) of your sinuses. Sinuses are hollow spaces in the bones around your face. They are located:  Around your eyes.  In the middle of your  forehead.  Behind your nose.  In your cheekbones. Your sinuses and nasal passages are lined with a fluid called mucus. Mucus drains out of your sinuses. Swelling can trap mucus in your sinuses. This lets germs (bacteria, virus, or fungus) grow, which leads to infection. Most of the time, this condition is caused by a virus. What are the causes? This condition is caused by:  Allergies.  Asthma.  Germs.  Things that block your nose or sinuses.  Growths in the nose (nasal polyps).  Chemicals or irritants in the air.  Fungus (rare). What increases the risk? You are more likely to develop this condition if:  You have a weak body defense system (immune system).  You do a lot of swimming or diving.  You use nasal sprays too much.  You smoke. What are the signs or symptoms? The main symptoms of this condition are pain and a feeling of pressure around the sinuses. Other symptoms include:  Stuffy nose (congestion).  Runny nose (drainage).  Swelling and warmth in the sinuses.  Headache.  Toothache.  A cough that may get worse at night.  Mucus that collects in the throat or the back of the nose (postnasal drip).  Being unable to smell and taste.  Being very tired (fatigue).  A fever.  Sore throat.  Bad breath. How is this diagnosed? This condition is diagnosed based on:  Your symptoms.  Your medical history.  A physical exam.  Tests to find out if your condition is short-term (acute) or long-term (chronic). Your doctor may: ? Check your nose for growths (polyps). ? Check your sinuses using a tool that has a light (endoscope). ? Check for allergies or germs. ? Do imaging tests, such as an MRI or CT scan. How is this treated? Treatment for this condition depends on the cause and whether it is short-term or long-term.  If caused by a virus, your symptoms should go away on their own within 10 days. You may be given medicines to relieve symptoms. They  include: ? Medicines that shrink swollen tissue in the nose. ? Medicines that treat allergies (antihistamines). ? A spray that treats swelling of the nostrils. ? Rinses that help get rid of thick mucus in your nose (nasal saline washes).  If caused by bacteria, your doctor may wait to see if you will get better without treatment. You may be given antibiotic medicine if you have: ? A very bad infection. ? A weak body defense system.  If caused by growths in the nose, you may need to have surgery. Follow these instructions at home: Medicines  Take, use, or apply over-the-counter and prescription medicines only as told by your doctor. These may include nasal sprays.  If you were prescribed an antibiotic medicine, take it as told by your doctor. Do not stop taking the antibiotic even if you start to feel better. Hydrate and humidify   Drink enough water to keep your pee (urine) pale yellow.  Use a cool mist humidifier to keep the humidity level in your home above 50%.  Breathe in steam for 10-15 minutes, 3-4 times a day, or as told by your doctor. You can do this in the bathroom while a hot shower is running.  Try not to spend time in cool or dry air. Rest  Rest as much as you can.  Sleep with your head raised (elevated).  Make sure you get enough sleep each night. General instructions   Put a warm, moist washcloth on your face 3-4 times a day, or as often as told by your doctor. This will help with discomfort.  Wash your hands often with soap and water. If there is no soap and water, use hand sanitizer.  Do not smoke. Avoid being around people who are smoking (secondhand smoke).  Keep all follow-up visits as told by your doctor. This is important. Contact a doctor if:  You have a fever.  Your symptoms get worse.  Your symptoms do not get better within 10 days. Get help right away if:  You have a very bad headache.  You cannot stop throwing up (vomiting).  You  have very bad pain or swelling around your face or eyes.  You have trouble seeing.  You feel confused.  Your neck is stiff.  You have trouble breathing. Summary  Sinusitis is swelling of your sinuses. Sinuses are hollow spaces in the bones around your face.  This condition is caused by tissues in your nose that become inflamed or swollen. This traps germs. These can lead to infection.  If you were prescribed an antibiotic medicine, take it as told by your doctor. Do not stop taking it even if you start to feel better.  Keep all follow-up visits as told by your doctor. This is important. This information is not intended to replace advice given to you by your health care provider. Make sure you discuss any questions you have with  your health care provider. Document Released: 09/20/2007 Document Revised: 09/03/2017 Document Reviewed: 09/03/2017 Elsevier Interactive Patient Education  2019 [employer].

## 2018-07-22 NOTE — Telephone Encounter (Signed)
Virtual visit please. 

## 2018-07-23 ENCOUNTER — Ambulatory Visit: Payer: 59 | Admitting: Family Medicine

## 2018-07-26 ENCOUNTER — Encounter: Payer: Self-pay | Admitting: Family Medicine

## 2018-07-26 ENCOUNTER — Other Ambulatory Visit: Payer: Self-pay

## 2018-07-26 ENCOUNTER — Telehealth: Payer: Self-pay | Admitting: Emergency Medicine

## 2018-07-26 ENCOUNTER — Ambulatory Visit (INDEPENDENT_AMBULATORY_CARE_PROVIDER_SITE_OTHER): Payer: 59 | Admitting: Family Medicine

## 2018-07-26 VITALS — BP 136/86 | HR 75 | Temp 98.7°F | Wt 173.0 lb

## 2018-07-26 DIAGNOSIS — R0602 Shortness of breath: Secondary | ICD-10-CM | POA: Diagnosis not present

## 2018-07-26 NOTE — Telephone Encounter (Signed)
Contacted patient and left message on cell This was a follow up call from [facility] visit

## 2018-07-26 NOTE — Assessment & Plan Note (Signed)
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 Patient would like to see pulmonology- will refer for evaluation, may need PFTs. If she can't get in to see them for several months, we can try some medication. Call with any concerns. Continue to monitor.

## 2018-07-26 NOTE — Progress Notes (Signed)
BP 136/86 Comment: patient reported- virtual visit  Pulse 75 Comment: pt reported- virtual visit  Temp 98.7 F (37.1 C) (Oral) Comment: pt reported- virtual visit  Wt 173 lb (78.5 kg) Comment: pt reported- virtual visit  BMI 30.65 kg/m    Subjective:    Patient ID: Brittany Ochoa, female    DOB: [birth year], 53 y.o.   MRN: [id number]  HPI: Brittany Ochoa is a 53 y.o. female  Chief Complaint  Patient presents with  . Referral    pt requesting referral to pulmonary, breathing difficulties when exercising at the gym. States this happened last year as wll.  . TELEMEDICINE VISIT   SHORTNESS OF BREATH- states that she "can't breathe" Notes that when she had pneumomnia she felt tight and tingling in her nose and her previous doctor stated that it was due to hyperventilating. She has been doing HIIT on the treadmill. She was able to keep up for a long time and recently she has been having problems keeping up on the treadmill. She uses her inhaler while she's doing class or before class and nothing seems to help. She notes that she can't get a deep breath in and then can't get it out. Has never had breathing problems in the past, but it seems to be happening more. She notes that she smoked for 25 years but quit 14 years ago. Diagnosed with exercise induced asthma about 10-12 years ago Duration: months- happened about a year ago, then came back Onset: with talking a lot or exercising Severity: mild Episode duration:  Hours, also has burning in her chest Frequency: with talking too much and exercising, about 3-4x a week Related to exertion: yes Cough: no Chest tightness: yes Wheezing: no Fevers: no Chest pain: no Palpitations: no  Nausea: no Diaphoresis: no Deconditioning: yes Status: worse  Relevant past medical, surgical, family and social history reviewed and updated as indicated. Interim medical history since our last visit reviewed. Allergies and medications reviewed and  updated.  Review of Systems  Constitutional: Negative.   HENT: Negative for congestion, dental problem, drooling, ear discharge, ear pain, facial swelling, hearing loss, mouth sores, nosebleeds, postnasal drip, rhinorrhea, sinus pressure, sinus pain, sneezing, sore throat, tinnitus, trouble swallowing and voice change.   Respiratory: Positive for cough, chest tightness, shortness of breath, wheezing and stridor. Negative for apnea and choking.   Cardiovascular: Negative.   Gastrointestinal: Negative.   Neurological: Negative.   Psychiatric/Behavioral: Negative.     Per HPI unless specifically indicated above     Objective:    BP 136/86 Comment: patient reported- virtual visit  Pulse 75 Comment: pt reported- virtual visit  Temp 98.7 F (37.1 C) (Oral) Comment: pt reported- virtual visit  Wt 173 lb (78.5 kg) Comment: pt reported- virtual visit  BMI 30.65 kg/m   Wt Readings from Last 3 Encounters:  07/26/18 173 lb (78.5 kg)  07/22/18 176 lb 12.8 oz (80.2 kg)  06/14/18 194 lb 4.8 oz (88.1 kg)    Physical Exam Vitals signs and nursing note reviewed.  Constitutional:      General: She is not in acute distress.    Appearance: Normal appearance. She is not ill-appearing, toxic-appearing or diaphoretic.  HENT:     Head: Normocephalic and atraumatic.     Right Ear: External ear normal.     Left Ear: External ear normal.     Nose: Nose normal.     Mouth/Throat:     Mouth: Mucous membranes are moist.  Pharynx: Oropharynx is clear.  Eyes:     General: No scleral icterus.       Right eye: No discharge.        Left eye: No discharge.     Conjunctiva/sclera: Conjunctivae normal.     Pupils: Pupils are equal, round, and reactive to light.  Neck:     Musculoskeletal: Normal range of motion.  Pulmonary:     Effort: Pulmonary effort is normal. No respiratory distress.     Comments: Speaking in full sentences On forced exhalation has "wheezing" sounds more stridorous than wheezing,  but she notes that it is coming from her chest not her throat Musculoskeletal: Normal range of motion.  Skin:    Coloration: Skin is not jaundiced or pale.     Findings: No bruising, erythema, lesion or rash.  Neurological:     Mental Status: She is alert and oriented to person, place, and time. Mental status is at baseline.  Psychiatric:        Mood and Affect: Mood normal.        Behavior: Behavior normal.        Thought Content: Thought content normal.        Judgment: Judgment normal.     Results for orders placed or performed in visit on 06/14/18  Calcitonin  Result Value Ref Range   Calcitonin <2.0 0.0 - 5.0 pg/mL  Thyroglobulin antibody  Result Value Ref Range   Thyroglobulin Antibody <1.0 0.0 - 0.9 IU/mL  Thyroid peroxidase antibody  Result Value Ref Range   Thyroperoxidase Ab SerPl-aCnc 12 0 - 34 IU/mL  Thyroglobulin Level  Result Value Ref Range   Thyroglobulin (TG-RIA) 46 (H) ng/mL  T4, free  Result Value Ref Range   Free T4 1.14 0.82 - 1.77 ng/dL  T3, free  Result Value Ref Range   T3, Free 2.9 2.0 - 4.4 pg/mL      Assessment & Plan:   Problem List Items Addressed This Visit      Other   SOB (shortness of breath) - Primary    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 Patient would like to see pulmonology- will refer for evaluation, may need PFTs. If she can't get in to see them for several months, we can try some medication. Call with any concerns. Continue to monitor.       Relevant Orders   Ambulatory referral to Pulmonology       Follow up plan: Return if symptoms worsen or fail to improve.    . This visit was completed via FaceTime due to the restrictions of the COVID-19 pandemic. All issues as above were discussed and addressed. Physical exam was done as above through visual confirmation on FaceTime. If it was felt that the patient should be evaluated in the office, they were directed there. The patient verbally consented to  this visit. . Location of the patient: home . Location of the provider: home . Those involved with this call:  . Provider: Park Liter, DO . CMA: Yvonna Alanis, [place] . Front Desk/Registration: Don Perking  . Time spent on call: 15 minutes with patient face to face via video conference. More than 50% of this time was spent in counseling and coordination of care. 23 minutes total spent in review of patient's record and preparation of their chart.

## 2018-08-14 ENCOUNTER — Ambulatory Visit: Payer: Self-pay | Admitting: Surgery

## 2018-09-03 ENCOUNTER — Telehealth: Payer: Self-pay | Admitting: Pulmonary Disease

## 2018-09-03 ENCOUNTER — Other Ambulatory Visit: Payer: Self-pay

## 2018-09-03 ENCOUNTER — Ambulatory Visit (INDEPENDENT_AMBULATORY_CARE_PROVIDER_SITE_OTHER): Payer: 59 | Admitting: Pulmonary Disease

## 2018-09-03 ENCOUNTER — Encounter: Payer: Self-pay | Admitting: Pulmonary Disease

## 2018-09-03 VITALS — BP 138/90 | HR 87 | Temp 98.4°F | Ht 63.0 in | Wt 179.6 lb

## 2018-09-03 DIAGNOSIS — J383 Other diseases of vocal cords: Secondary | ICD-10-CM | POA: Diagnosis not present

## 2018-09-03 DIAGNOSIS — R0600 Dyspnea, unspecified: Secondary | ICD-10-CM | POA: Diagnosis not present

## 2018-09-03 DIAGNOSIS — K219 Gastro-esophageal reflux disease without esophagitis: Secondary | ICD-10-CM | POA: Diagnosis not present

## 2018-09-03 DIAGNOSIS — K2100 Gastro-esophageal reflux disease with esophagitis, without bleeding: Secondary | ICD-10-CM

## 2018-09-03 MED ORDER — DEXLANSOPRAZOLE 60 MG PO CPDR: 60.0000 mg | DELAYED_RELEASE_CAPSULE | Freq: Every day | ORAL | 3 refills | Status: DC

## 2018-09-03 MED ORDER — PANTOPRAZOLE SODIUM 40 MG PO TBEC: 40.0000 mg | DELAYED_RELEASE_TABLET | Freq: Every day | ORAL | 2 refills | Status: DC

## 2018-09-03 NOTE — Telephone Encounter (Signed)
Received change request for Dexilant from [place].  Per insurance pt must try Protonix prior.  Per Dr. Patsey Berthold verbally- okay to prescribe Protonix 40mg  daily. Rx for protonix has been sent to preferred pharmacy. Nothing further is needed.

## 2018-09-03 NOTE — Progress Notes (Signed)
Subjective:    Patient ID: Brittany Ochoa, female    DOB: 1965-12-04, 53 y.o.   MRN: [id number]  HPI The patient is a 53 year old former smoker who presents for evaluation of difficulty breathing particularly associated with exercise.  The patient is kindly referred by Dr. Park Liter, DO.  Patient states that the symptoms have been ongoing for approximately a year.  She states that she has been diagnosed with "exercise-induced asthma" in the past.  She was given an albuterol inhaler to use before exercising.  This was when she was residing in [place].  She has relocated to this area.  She notes that initially the inhaler helped her but over the last year this has not.  She has noted over the last year when she tries to do high intensity workouts she notes a burning sensation in her chest which can last for approximately a day and the feeling that she cannot get air in or air out during exercise.  She also notes that when she tries to breathe in or out she sounds like she is "croaking".  She has had issues with gastroesophageal reflux.  She underwent EGD in October 2019 that showed pathology consistent with reflux esophagitis and antral gastritis.  The patient states that she was not aware of these findings on the biopsy specimens taken during the EGD.  It appears that the gastroenterologist/endoscopist was trying to rule out eosinophilic esophagitis which the patient did not have.  In any event the patient does not note dyspnea at any other times except when this burning sensation is made worse during exercise.  She states that resting from exercise helps her symptoms.  She has noted over the course of the past year that she has become more hoarse.  Her diet can be erratic at times.  States that she is doing mostly "green juicing", broiled chicken and salads.  She has not described any food allergies that she is aware of.  She does have issues with seasonal allergic rhinitis for which she uses  Flonase.  She does not use any other inhalers except for as needed albuterol.  She does not describe any fevers, chills or sweats.  Though she has had odynophagia in the past this has not been a recent issue.  She does not have any orthopnea or paroxysmal nocturnal dyspnea.  The patient is currently employed at the [employer], she has no occupational exposure.  Past medical history, surgical history and social history have been reviewed.  The patient is a former smoker having smoked 1 to 3 packs of cigarettes per day she quit in January 2006.  She smoked for 25 years.  She does not endorse illegal drug use.  She does not drink alcohol to excess.    Review of Systems  Constitutional: Negative.   HENT: Positive for congestion, postnasal drip and voice change.   Eyes: Negative.   Respiratory: Positive for choking and shortness of breath.   Cardiovascular: Negative.   Gastrointestinal:       Reflux symptoms.  Endocrine: Negative.   Genitourinary: Negative.   Musculoskeletal: Negative.   Skin: Negative.   Allergic/Immunologic: Negative.   Neurological: Negative.   Hematological: Negative.   Psychiatric/Behavioral: Negative.   All other systems reviewed and are negative.      Objective:   Physical Exam Vitals signs and nursing note reviewed.  Constitutional:      Appearance: Normal appearance. She is overweight. She is not ill-appearing.  HENT:  Head: Normocephalic and atraumatic.     Right Ear: External ear normal.     Left Ear: External ear normal.  Eyes:     General: No scleral icterus.    Extraocular Movements: Extraocular movements intact.     Conjunctiva/sclera: Conjunctivae normal.     Pupils: Pupils are equal, round, and reactive to light.  Neck:     Musculoskeletal: Neck supple.  Cardiovascular:     Rate and Rhythm: Normal rate and regular rhythm.     Pulses: Normal pulses.     Heart sounds: Normal heart sounds.  Pulmonary:     Effort: Pulmonary effort is  normal. No respiratory distress.     Breath sounds: Normal breath sounds. No stridor.  Abdominal:     General: There is no distension.     Tenderness: There is no abdominal tenderness.  Musculoskeletal: Normal range of motion.     Right lower leg: No edema.     Left lower leg: No edema.  Skin:    General: Skin is warm and dry.  Neurological:     General: No focal deficit present.     Mental Status: She is alert and oriented to person, place, and time.  Psychiatric:        Mood and Affect: Mood normal.        Behavior: Behavior normal.   Available data reviewed in particular EGD report from October 2019 and pathology reports from that procedure.        Assessment & Plan:   1.  Dyspnea: With associated reflux symptoms and what appears to be stridorous type of respirations suspect vocal cord dysfunction with paradoxical motion likely triggered by gastroesophageal reflux with laryngopharyngeal component.  Management will be as below.  The patient has prior biopsy-proven reflux esophagitis changes as well as antral gastritis.  She has not been treated for these issues and this would be the first step.  To exclude other issues will obtain pulmonary function testing as well.  2.  Gastroesophageal reflux with biopsy-proven reflux esophagitis: Recommend PPI.  Initially prescribed Dexilant however patient's insurance will cover tonics prescription was switched to this.  She will take Protonix 40 mg daily.  Ideally she should have twice a day dosage of antiacid at least for several weeks so we will have her treat with Pepcid 20 mg at bedtime or before exercise.  She was advised of antireflux measures.  She also was advised to keep a food diary to see if any particular foods aggravate her symptoms.  3.  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  This is likely causing issues with vocal cord spasm and the patient's symptomatology.  Management as above.  4.  Vocal cord dysfunction: Likely  triggered by LPR.  Management as above.  We will see the patient in follow-up in 3 to 4 weeks time she is to contact us prior to that time should any new difficulties arise.  Thank you for allowing [place] to participate in this patient's care.   This chart was dictated using voice recognition software/Dragon.  Despite best efforts to proofread, errors can occur which can change the meaning.  Any change was purely unintentional.

## 2018-09-03 NOTE — Patient Instructions (Signed)
1.  You have gastroesophageal reflux disease (GERD) with reflux esophagitis.  This in turn results and laryngopharyngeal reflux (LPR) which can result in hoarseness and vocal cord spasm causing your difficulties in breathing in and out.  We will start medication for the reflux.  2.  You have been prescribed Dexilant 60 mg, 1 daily.  Recommend that you take it in the morning before a meal and you can take Pepcid 20 mg (over-the-counter) at bedtime and/or the minutes before you exercise.  3.  Schedule you for breathing tests as the slots become available in the pulmonary function lab.  4.  Follow-up will be in 3 to 4 weeks to see how you are coming along.  Call sooner if any new difficulties arise.

## 2018-09-04 ENCOUNTER — Ambulatory Visit: Payer: Self-pay | Admitting: Surgery

## 2018-09-05 NOTE — Telephone Encounter (Signed)
Dr. Gonzalez please advise. Thanks 

## 2018-09-06 ENCOUNTER — Telehealth: Payer: 59 | Admitting: Surgery

## 2018-09-06 ENCOUNTER — Telehealth (INDEPENDENT_AMBULATORY_CARE_PROVIDER_SITE_OTHER): Payer: 59 | Admitting: Surgery

## 2018-09-06 ENCOUNTER — Other Ambulatory Visit: Payer: Self-pay

## 2018-09-06 DIAGNOSIS — I714 Abdominal aortic aneurysm, without rupture: Secondary | ICD-10-CM | POA: Diagnosis not present

## 2018-09-06 NOTE — Telephone Encounter (Signed)
[address]       [place] [address]             [phone]     CARDIOTHORACIC SURGERY TELEPHONE VIRTUAL OFFICE NOTE  Referring Provider is No ref. provider found Primary Cardiologist is No primary care provider on file. PCP is [name], DO   HPI:  I spoke with Brittany Ochoa (DOB 12/01/1965 ) via telephone on 09/06/2018 at 3:41 PM and verified that I was speaking with the correct person using more than one form of identification.  We discussed the reason(s) for conducting our visit virtually instead of in-person.  The patient expressed understanding the circumstances and agreed to proceed as described.   The patient is a 52 year old woman who is a radiation therapist at [employer] and presented on 04/30/2017 with complaints of chest pain radiating into her neck and down her left arm with numbness in left arm and palpitations.  She ruled out for myocardial infarction and electrocardiogram was unremarkable.  She had a CT of the chest and neck which showed a 4 cm fusiform ascending aortic aneurysm. She was also evaluated by Dr. Fletcher Anon on 06/29/2017 and had an echocardiogram showing normal left ventricular systolic function with ejection fraction of 60-65%.  The aortic valve was trileaflet with normal leaflets and no restriction of mobility.  There is no stenosis or regurgitation.  The ascending aorta was measured at 39 mm.  I saw her on July 11, 2017 and recommended follow-up in 1 year with a CTA of the chest.  She says that she has been feeling well.  She still has occasional brief episodes of chest discomfort but not like she had before.  She has been trying to eat well and exercise.  She has been checking her blood pressure at home and has been staying under good control.   Current Outpatient Medications  Medication Sig Dispense Refill  . albuterol (PROVENTIL HFA;VENTOLIN HFA) 108 (90 Base) MCG/ACT inhaler Inhale 2 puffs into the lungs every  4 (four) hours as needed. 1 Inhaler 0  . fluticasone (FLONASE) 50 MCG/ACT nasal spray Place 2 sprays into both nostrils daily. 16 g 6  . pantoprazole (PROTONIX) 40 MG tablet Take 1 tablet (40 mg total) by mouth daily. In the morning 30 tablet 2   No current facility-administered medications for this visit.      Diagnostic Tests:  CLINICAL DATA:  Follow-up thoracic aortic aneurysm  EXAM: CT ANGIOGRAPHY CHEST WITH CONTRAST  TECHNIQUE: Multidetector CT imaging of the chest was performed using the standard protocol during bolus administration of intravenous contrast. Multiplanar CT image reconstructions and MIPs were obtained to evaluate the vascular anatomy.  CONTRAST:  26mL ISOVUE-370 IOPAMIDOL (ISOVUE-370) INJECTION 76%  COMPARISON:  04/29/2017  FINDINGS: Cardiovascular: The tubular thoracic aorta measures 4.1 x 4.0 cm, unchanged from prior. Normal heart size. No pericardial effusion.  Mediastinum/Nodes: No enlarged mediastinal, hilar, or axillary lymph nodes. Thyroid gland, trachea, and esophagus demonstrate no significant findings.  Lungs/Pleura: Stable small pulmonary nodule of the right apex, consistent with benign sequelae of infection or inflammation. Bandlike scarring or atelectasis of the bilateral lung bases. No pleural effusion or pneumothorax.  Upper Abdomen: No acute abnormality.  Musculoskeletal: No chest wall abnormality. No acute or significant osseous findings.  Review of the MIP images confirms the above findings.  IMPRESSION: The tubular thoracic aorta measures 4.1 x 4.0 cm, unchanged from prior.   Electronically Signed   By: Cristie Hem  Laqueta Carina M.D.   On: 07/03/2018 15:33  Impression:  She has a stable fusiform ascending aortic aneurysm with maximum diameter of 4.1 x 4.0 cm which is unchanged from her CT scan in January 2019.  Her descending thoracic aorta only measures 2.1 cm distally so I think this size is probably more significant  than it sounds.  Normal trileaflet aortic valve by echocardiogram.  This aneurysm is still well below the usual surgical threshold of 5.5 cm but given her small descending aortic size I would probably think about surgery at around 4.5-5 cm.  I reviewed the CT scan results with her and answered her questions.  I stressed the importance of good blood pressure control and advised her to continue following her blood pressure at home.  I recommended a good goal as 130/80 or less.  Plan:  I will see her back in 1 year with a CTA of the chest to follow-up on her ascending aortic aneurysm.    I discussed limitations of evaluation and management via telephone.  The patient was advised to call back for repeat telephone consultation or to seek an in-person evaluation if questions arise or the patient's clinical condition changes in any significant manner.  I spent 10 minutes of non-face-to-face time during the conduct of this telephone virtual office consultation.    Gaye Pollack, MD 09/06/2018 3:41 PM

## 2018-09-10 NOTE — Telephone Encounter (Signed)
Dr. Patsey Berthold please see 09/10/2018 mychart encounter- pt stated that she will start the Protonix and Pepcid after further research.

## 2018-09-16 ENCOUNTER — Institutional Professional Consult (permissible substitution): Payer: Self-pay | Admitting: Pulmonary Disease

## 2018-09-24 ENCOUNTER — Telehealth: Payer: Self-pay | Admitting: Pulmonary Disease

## 2018-09-24 NOTE — Telephone Encounter (Signed)
Sent as Staff message  Jannifer Franklin  Catha Gosselin  Phone Number: [phone]        Lonia Farber,   Are you able to talk with Pamala Hurry to get this pt's PFT rescheduled. She is unable to make the appt. Already scheduled on 12/03/2018 because of work. She said she called but no one has called her back. Office visit has not been rescheduled yet due to PFT eventually being changed.   Thank you    Called and spoke with Pamala Hurry and kept PFT Appointment on 12/03/2018 but moved to 4:00 arrival.  Called patient and advised her of appointment. When pt was advised that she would have to have COVID test, pt refused to have test or any blood work.    Per Erasmo Downer suggestion, if patient agrees, keep PFT on for August 18 at 4:15 and if [facility] is still requesting the COVID testing, pt will be contacted and she can refuse at that time.  F/U appointment scheduled and patient is aware that our nurse will contact her about a week before if COVID testing is still required for the PFT.    Just FYI for physician and nurse. ,Carlos Levering Cobb

## 2018-09-24 NOTE — Telephone Encounter (Signed)
Noted, thank you

## 2018-10-02 ENCOUNTER — Ambulatory Visit: Payer: 59 | Admitting: Pulmonary Disease

## 2018-10-04 ENCOUNTER — Encounter: Payer: Self-pay | Admitting: Family Medicine

## 2018-10-04 ENCOUNTER — Other Ambulatory Visit: Payer: Self-pay

## 2018-10-04 ENCOUNTER — Ambulatory Visit (INDEPENDENT_AMBULATORY_CARE_PROVIDER_SITE_OTHER): Payer: 59 | Admitting: Family Medicine

## 2018-10-04 VITALS — BP 125/81 | HR 91 | Temp 98.8°F | Ht 63.25 in | Wt 186.2 lb

## 2018-10-04 DIAGNOSIS — Z780 Asymptomatic menopausal state: Secondary | ICD-10-CM | POA: Insufficient documentation

## 2018-10-04 DIAGNOSIS — Z8709 Personal history of other diseases of the respiratory system: Secondary | ICD-10-CM | POA: Diagnosis not present

## 2018-10-04 DIAGNOSIS — Z0000 Encounter for general adult medical examination without abnormal findings: Secondary | ICD-10-CM

## 2018-10-04 LAB — UA/M W/RFLX CULTURE, ROUTINE
Bilirubin, UA: NEGATIVE
Glucose, UA: NEGATIVE
Leukocytes,UA: NEGATIVE
Nitrite, UA: NEGATIVE
Protein,UA: NEGATIVE
RBC, UA: NEGATIVE
Specific Gravity, UA: 1.02 (ref 1.005–1.030)
Urobilinogen, Ur: 0.2 mg/dL (ref 0.2–1.0)
pH, UA: 7 (ref 5.0–7.5)

## 2018-10-04 LAB — MICROALBUMIN, URINE WAIVED
Creatinine, Urine Waived: 100 mg/dL (ref 10–300)
Microalb, Ur Waived: 30 mg/L — ABNORMAL HIGH (ref 0–19)
Microalb/Creat Ratio: <30 mg/g (ref <30)

## 2018-10-04 LAB — BAYER DCA HB A1C WAIVED: HB A1C (BAYER DCA - WAIVED): 5.5 % (ref <7.0)

## 2018-10-04 NOTE — Patient Instructions (Signed)
Health Maintenance, Female °Adopting a healthy lifestyle and getting preventive care can go a long way to promote health and wellness. Talk with your health care provider about what schedule of regular examinations is right for you. This is a good chance for you to check in with your provider about disease prevention and staying healthy. °In between checkups, there are plenty of things you can do on your own. Experts have done a lot of research about which lifestyle changes and preventive measures are most likely to keep you healthy. Ask your health care provider for more information. °Weight and diet °Eat a healthy diet °· Be sure to include plenty of vegetables, fruits, low-fat dairy products, and lean protein. °· Do not eat a lot of foods high in solid fats, added sugars, or salt. °· Get regular exercise. This is one of the most important things you can do for your health. °? Most adults should exercise for at least 150 minutes each week. The exercise should increase your heart rate and make you sweat (moderate-intensity exercise). °? Most adults should also do strengthening exercises at least twice a week. This is in addition to the moderate-intensity exercise. °Maintain a healthy weight °· Body mass index (BMI) is a measurement that can be used to identify possible weight problems. It estimates body fat based on height and weight. Your health care provider can help determine your BMI and help you achieve or maintain a healthy weight. °· For females 20 years of age and older: °? A BMI below 18.5 is considered underweight. °? A BMI of 18.5 to 24.9 is normal. °? A BMI of 25 to 29.9 is considered overweight. °? A BMI of 30 and above is considered obese. °Watch levels of cholesterol and blood lipids °· You should start having your blood tested for lipids and cholesterol at 51 years of age, then have this test every 5 years. °· You may need to have your cholesterol levels checked more often if: °? Your lipid or  cholesterol levels are high. °? You are older than 50 years of age. °? You are at high risk for heart disease. °Cancer screening °Lung Cancer °· Lung cancer screening is recommended for adults 55-80 years old who are at high risk for lung cancer because of a history of smoking. °· A yearly low-dose CT scan of the lungs is recommended for people who: °? Currently smoke. °? Have quit within the past 15 years. °? Have at least a 30-pack-year history of smoking. A pack year is smoking an average of one pack of cigarettes a day for 1 year. °· Yearly screening should continue until it has been 15 years since you quit. °· Yearly screening should stop if you develop a health problem that would prevent you from having lung cancer treatment. °Breast Cancer °· Practice breast self-awareness. This means understanding how your breasts normally appear and feel. °· It also means doing regular breast self-exams. Let your health care provider know about any changes, no matter how small. °· If you are in your 20s or 30s, you should have a clinical breast exam (CBE) by a health care provider every 1-3 years as part of a regular health exam. °· If you are 40 or older, have a CBE every year. Also consider having a breast X-ray (mammogram) every year. °· If you have a family history of breast cancer, talk to your health care provider about genetic screening. °· If you are at high risk for breast cancer, talk   to your health care provider about having an MRI and a mammogram every year.  Breast cancer gene (BRCA) assessment is recommended for women who have family members with BRCA-related cancers. BRCA-related cancers include: ? Breast. ? Ovarian. ? Tubal. ? Peritoneal cancers.  Results of the assessment will determine the need for genetic counseling and BRCA1 and BRCA2 testing. Cervical Cancer Your health care provider may recommend that you be screened regularly for cancer of the pelvic organs (ovaries, uterus, and vagina).  This screening involves a pelvic examination, including checking for microscopic changes to the surface of your cervix (Pap test). You may be encouraged to have this screening done every 3 years, beginning at age 31.  For women ages 19-65, health care providers may recommend pelvic exams and Pap testing every 3 years, or they may recommend the Pap and pelvic exam, combined with testing for human papilloma virus (HPV), every 5 years. Some types of HPV increase your risk of cervical cancer. Testing for HPV may also be done on women of any age with unclear Pap test results.  Other health care providers may not recommend any screening for nonpregnant women who are considered low risk for pelvic cancer and who do not have symptoms. Ask your health care provider if a screening pelvic exam is right for you.  If you have had past treatment for cervical cancer or a condition that could lead to cancer, you need Pap tests and screening for cancer for at least 20 years after your treatment. If Pap tests have been discontinued, your risk factors (such as having a new sexual partner) need to be reassessed to determine if screening should resume. Some women have medical problems that increase the chance of getting cervical cancer. In these cases, your health care provider may recommend more frequent screening and Pap tests. Colorectal Cancer  This type of cancer can be detected and often prevented.  Routine colorectal cancer screening usually begins at 51 years of age and continues through 50 years of age.  Your health care provider may recommend screening at an earlier age if you have risk factors for colon cancer.  Your health care provider may also recommend using home test kits to check for hidden blood in the stool.  A small camera at the end of a tube can be used to examine your colon directly (sigmoidoscopy or colonoscopy). This is done to check for the earliest forms of colorectal cancer.  Routine  screening usually begins at age 17.  Direct examination of the colon should be repeated every 5-10 years through 51 years of age. However, you may need to be screened more often if early forms of precancerous polyps or small growths are found. Skin Cancer  Check your skin from head to toe regularly.  Tell your health care provider about any new moles or changes in moles, especially if there is a change in a mole's shape or color.  Also tell your health care provider if you have a mole that is larger than the size of a pencil eraser.  Always use sunscreen. Apply sunscreen liberally and repeatedly throughout the day.  Protect yourself by wearing long sleeves, pants, a wide-brimmed hat, and sunglasses whenever you are outside. Heart disease, diabetes, and high blood pressure  High blood pressure causes heart disease and increases the risk of stroke. High blood pressure is more likely to develop in: ? People who have blood pressure in the high end of the normal range (130-139/85-89 mm Hg). ? People  who are overweight or obese. ? People who are African American.  If you are 78-43 years of age, have your blood pressure checked every 3-5 years. If you are 83 years of age or older, have your blood pressure checked every year. You should have your blood pressure measured twice--once when you are at a hospital or clinic, and once when you are not at a hospital or clinic. Record the average of the two measurements. To check your blood pressure when you are not at a hospital or clinic, you can use: ? An automated blood pressure machine at a pharmacy. ? A home blood pressure monitor.  If you are between 65 years and 60 years old, ask your health care provider if you should take aspirin to prevent strokes.  Have regular diabetes screenings. This involves taking a blood sample to check your fasting blood sugar level. ? If you are at a normal weight and have a low risk for diabetes, have this test once  every three years after 51 years of age. ? If you are overweight and have a high risk for diabetes, consider being tested at a younger age or more often. Preventing infection Hepatitis B  If you have a higher risk for hepatitis B, you should be screened for this virus. You are considered at high risk for hepatitis B if: ? You were born in a country where hepatitis B is common. Ask your health care provider which countries are considered high risk. ? Your parents were born in a high-risk country, and you have not been immunized against hepatitis B (hepatitis B vaccine). ? You have HIV or AIDS. ? You use needles to inject street drugs. ? You live with someone who has hepatitis B. ? You have had sex with someone who has hepatitis B. ? You get hemodialysis treatment. ? You take certain medicines for conditions, including cancer, organ transplantation, and autoimmune conditions. Hepatitis C  Blood testing is recommended for: ? Everyone born from [birth year] through 1963. ? Anyone with known risk factors for hepatitis C. Sexually transmitted infections (STIs)  You should be screened for sexually transmitted infections (STIs) including gonorrhea and chlamydia if: ? You are sexually active and are younger than 51 years of age. ? You are older than 51 years of age and your health care provider tells you that you are at risk for this type of infection. ? Your sexual activity has changed since you were last screened and you are at an increased risk for chlamydia or gonorrhea. Ask your health care provider if you are at risk.  If you do not have HIV, but are at risk, it may be recommended that you take a prescription medicine daily to prevent HIV infection. This is called pre-exposure prophylaxis (PrEP). You are considered at risk if: ? You are sexually active and do not regularly use condoms or know the HIV status of your partner(s). ? You take drugs by injection. ? You are sexually active with a partner  who has HIV. Talk with your health care provider about whether you are at high risk of being infected with HIV. If you choose to begin PrEP, you should first be tested for HIV. You should then be tested every 3 months for as long as you are taking PrEP. Pregnancy  If you are premenopausal and you may become pregnant, ask your health care provider about preconception counseling.  If you may become pregnant, take 400 to 800 micrograms (mcg) of folic acid every  day.  If you want to prevent pregnancy, talk to your health care provider about birth control (contraception). Osteoporosis and menopause  Osteoporosis is a disease in which the bones lose minerals and strength with aging. This can result in serious bone fractures. Your risk for osteoporosis can be identified using a bone density scan.  If you are 2 years of age or older, or if you are at risk for osteoporosis and fractures, ask your health care provider if you should be screened.  Ask your health care provider whether you should take a calcium or vitamin D supplement to lower your risk for osteoporosis.  Menopause may have certain physical symptoms and risks.  Hormone replacement therapy may reduce some of these symptoms and risks. Talk to your health care provider about whether hormone replacement therapy is right for you. Follow these instructions at home:  Schedule regular health, dental, and eye exams.  Stay current with your immunizations.  Do not use any tobacco products including cigarettes, chewing tobacco, or electronic cigarettes.  If you are pregnant, do not drink alcohol.  If you are breastfeeding, limit how much and how often you drink alcohol.  Limit alcohol intake to no more than 1 drink per day for nonpregnant women. One drink equals 12 ounces of beer, 5 ounces of wine, or 1 ounces of hard liquor.  Do not use street drugs.  Do not share needles.  Ask your health care provider for help if you need support  or information about quitting drugs.  Tell your health care provider if you often feel depressed.  Tell your health care provider if you have ever been abused or do not feel safe at home. This information is not intended to replace advice given to you by your health care provider. Make sure you discuss any questions you have with your health care provider. Document Released: 10/17/2010 Document Revised: 09/09/2015 Document Reviewed: 01/05/2015 Elsevier Interactive Patient Education  2019 [employer].  Menopause and Hormone Replacement Therapy What is hormone replacement therapy?  Hormone replacement therapy (HRT) is the use of artificial (synthetic) hormones to replace hormones that your body stops producing during menopause. Menopause is the normal time of life when menstrual periods stop completely and the ovaries stop producing the female hormones estrogen and progesterone. This lack of hormones can affect your health and cause undesirable symptoms. HRT can relieve some of those symptoms. What are my options for HRT? HRT may consist of the synthetic hormones estrogen and progestin, or it may consist of only estrogen (estrogen-only therapy). You and your health care provider will decide which form of HRT is best for you. If you choose to be on HRT and you have a uterus, estrogen and progestin are usually prescribed. Estrogen-only therapy is used for women who do not have a uterus. Possible options for taking HRT include:  Pills.  Patches.  Gels.  Sprays.  Vaginal cream.  Vaginal rings.  Vaginal inserts. The amount of hormone(s) that you take and how long you take the hormone(s) varies depending on your individual health. It is important to:  Begin HRT with the lowest possible dosage.  Stop HRT as soon as your health care provider tells you to stop.  Work with your health care provider so that you feel informed and comfortable with your decisions. What are the benefits of  HRT? HRT can reduce the frequency and severity of menopausal symptoms. Benefits of HRT vary depending on the menopausal symptoms that you have, the severity of  your symptoms, and your overall health. HRT may help to improve the following menopausal symptoms:  Hot flashes and night sweats. These are sudden feelings of heat that spread over the face and body. The skin may turn red, like a blush. Night sweats are hot flashes that happen while you are sleeping or trying to sleep.  Bone loss (osteoporosis). The body loses calcium more quickly after menopause, causing the bones to become weaker. This can increase the risk for bone breaks (fractures).  Vaginal dryness. The lining of the vagina can become thin and dry, which can cause pain during sexual intercourse or cause infection, burning, or itching.  Urinary tract infections.  Urinary incontinence. This is a decreased ability to control when you urinate.  Irritability.  Short-term memory problems. What are the risks of HRT? Risks of HRT vary depending on your individual health and medical history. Risks of HRT also depend on whether you receive both estrogen and progestin or you receive estrogen only.HRT may increase the risk of:  Spotting. This is when a small amount of bloodleaks from the vagina unexpectedly.  Endometrial cancer. This cancer is in the lining of the uterus (endometrium).  Breast cancer.  Increased density of breast tissue. This can make it harder to find breast cancer on a breast X-ray (mammogram).  Stroke.  Heart attack.  Blood clots.  Gallbladder disease. Risks of HRT can increase if you have any of the following conditions:  Endometrial cancer.  Liver disease.  Heart disease.  Breast cancer.  History of blood clots.  History of stroke. How should I care for myself while I am on HRT?  Take over-the-counter and prescription medicines only as told by your health care provider.  Get mammograms,  pelvic exams, and medical checkups as often as told by your health care provider.  Have Pap tests done as often as told by your health care provider. A Pap test is sometimes called a Pap smear. It is a screening test that is used to check for signs of cancer of the cervix and vagina. A Pap test can also identify the presence of infection or precancerous changes. Pap tests may be done: ? Every 3 years, starting at age 44. ? Every 5 years, starting after age 34, in combination with testing for human papillomavirus (HPV). ? More often or less often depending on other medical conditions you have, your age, and other risk factors.  It is your responsibility to get your Pap test results. Ask your health care provider or the department performing the test when your results will be ready.  Keep all follow-up visits as told by your health care provider. This is important. When should I seek medical care? Talk with your health care provider if:  You have any of these: ? Pain or swelling in your legs. ? Shortness of breath. ? Chest pain. ? Lumps or changes in your breasts or armpits. ? Slurred speech. ? Pain, burning, or bleeding when you urine.  You develop any of these: ? Unusual vaginal bleeding. ? Dizziness or headaches. ? Weakness or numbness in any part of your arms or legs. ? Pain in your abdomen. This information is not intended to replace advice given to you by your health care provider. Make sure you discuss any questions you have with your health care provider. Document Released: 12/31/2002 Document Revised: 11/16/2016 Document Reviewed: 10/05/2014 Elsevier Interactive Patient Education  2019 [employer].

## 2018-10-05 LAB — COMPREHENSIVE METABOLIC PANEL
ALT: 31 IU/L (ref 0–32)
AST: 28 IU/L (ref 0–40)
Albumin/Globulin Ratio: 2.4 — ABNORMAL HIGH (ref 1.2–2.2)
Albumin: 4.8 g/dL (ref 3.8–4.9)
Alkaline Phosphatase: 92 IU/L (ref 39–117)
BUN/Creatinine Ratio: 16 (ref 9–23)
BUN: 13 mg/dL (ref 6–24)
Bilirubin Total: 0.3 mg/dL (ref 0.0–1.2)
CO2: 27 mmol/L (ref 20–29)
Calcium: 9.9 mg/dL (ref 8.7–10.2)
Chloride: 102 mmol/L (ref 96–106)
Creatinine, Ser: 0.79 mg/dL (ref 0.57–1.00)
GFR calc Af Amer: 100 mL/min/{1.73_m2} (ref >59)
GFR calc non Af Amer: 87 mL/min/{1.73_m2} (ref >59)
Globulin, Total: 2 g/dL (ref 1.5–4.5)
Glucose: 86 mg/dL (ref 65–99)
Potassium: 4.4 mmol/L (ref 3.5–5.2)
Sodium: 142 mmol/L (ref 134–144)
Total Protein: 6.8 g/dL (ref 6.0–8.5)

## 2018-10-05 LAB — CBC WITH DIFFERENTIAL/PLATELET
Basophils Absolute: 0 10*3/uL (ref 0.0–0.2)
Basos: 1 %
EOS (ABSOLUTE): 0.1 10*3/uL (ref 0.0–0.4)
Eos: 1 %
Hematocrit: 39.5 % (ref 34.0–46.6)
Hemoglobin: 13.6 g/dL (ref 11.1–15.9)
Immature Grans (Abs): 0 10*3/uL (ref 0.0–0.1)
Immature Granulocytes: 1 %
Lymphocytes Absolute: 1.4 10*3/uL (ref 0.7–3.1)
Lymphs: 19 %
MCH: 31 pg (ref 26.6–33.0)
MCHC: 34.4 g/dL (ref 31.5–35.7)
MCV: 90 fL (ref 79–97)
Monocytes Absolute: 0.5 10*3/uL (ref 0.1–0.9)
Monocytes: 6 %
Neutrophils Absolute: 5.2 10*3/uL (ref 1.4–7.0)
Neutrophils: 72 %
Platelets: 271 10*3/uL (ref 150–450)
RBC: 4.39 x10E6/uL (ref 3.77–5.28)
RDW: 13.1 % (ref 11.7–15.4)
WBC: 7.2 10*3/uL (ref 3.4–10.8)

## 2018-10-05 LAB — LIPID PANEL W/O CHOL/HDL RATIO
Cholesterol, Total: 219 mg/dL — ABNORMAL HIGH (ref 100–199)
HDL: 56 mg/dL (ref >39)
LDL Calculated: 139 mg/dL — ABNORMAL HIGH (ref 0–99)
Triglycerides: 119 mg/dL (ref 0–149)
VLDL Cholesterol Cal: 24 mg/dL (ref 5–40)

## 2018-10-05 LAB — FOLLICLE STIMULATING HORMONE: FSH: 87.5 m[IU]/mL

## 2018-10-05 LAB — TSH: TSH: 0.982 u[IU]/mL (ref 0.450–4.500)

## 2018-10-05 LAB — SAR COV2 SEROLOGY (COVID19)AB(IGG),IA: SARS-CoV-2 Ab, IgG: NEGATIVE

## 2018-10-05 LAB — LUTEINIZING HORMONE: LH: 42.1 m[IU]/mL

## 2018-10-05 LAB — ESTRADIOL: Estradiol: 8.8 pg/mL

## 2018-10-05 LAB — VITAMIN D 25 HYDROXY (VIT D DEFICIENCY, FRACTURES): Vit D, 25-Hydroxy: 19.4 ng/mL — ABNORMAL LOW (ref 30.0–100.0)

## 2018-10-06 ENCOUNTER — Encounter: Payer: Self-pay | Admitting: Family Medicine

## 2018-10-06 MED ORDER — COMBIPATCH 0.05-0.14 MG/DAY TD PTTW: 1.0000 | MEDICATED_PATCH | TRANSDERMAL | 12 refills | Status: DC

## 2018-10-06 NOTE — Progress Notes (Signed)
BP 125/81 (BP Location: Left Arm, Patient Position: Sitting, Cuff Size: Normal)   Pulse 91   Temp 98.8 F (37.1 C) (Oral)   Ht 5' 3.25" (1.607 m)   Wt 186 lb 3.2 oz (84.5 kg)   SpO2 97%   BMI 32.72 kg/m    Subjective:    Patient ID: Brittany Ochoa, female    DOB: [birth year], 53 y.o.   MRN: [id number]  HPI: Brittany Ochoa is a 53 y.o. female presenting on 10/04/2018 for comprehensive medical examination. Current medical complaints include:  MENOPAUSAL SYMPTOMS- no menses in 1 year as of May Duration: exacerbated Symptom severity: severe Hot flashes: yes Night sweats: yes Sleep disturbances: yes Vaginal dryness: no Dyspareunia:no Decreased libido: no Emotional lability: yes Stress incontinence: no Previous HRT/pharmacotherapy: no Hysterectomy: no Absolute Contraindications to Hormonal Therapy:     Undiagnosed vaginal bleeding: no    Breast cancer: no    Endometrial cancer: no    Coronary disease: no    Cerebrovascular disease: no    Venous thromboembolic disease: no  Menopausal Symptoms: yes  Depression Screen done today and results listed below:  Depression screen [facility] 2/9 10/04/2018 10/04/2018 01/10/2018 09/28/2017 10/14/2015  Decreased Interest 1 1 0 1 0  Down, Depressed, Hopeless 1 1 0 1 0  PHQ - 2 Score 2 2 0 2 0  Altered sleeping 2 2 2 2  -  Tired, decreased energy 2 2 2 2  -  Change in appetite 3 3 3 2  -  Feeling bad or failure about yourself  3 3 0 1 -  Trouble concentrating 1 1 0 2 -  Moving slowly or fidgety/restless 0 0 0 0 -  Suicidal thoughts (No Data) 0 0 0 -  PHQ-9 Score 13 13 7 11  -  Difficult doing work/chores - Very difficult Not difficult at all - -    Past Medical History:  Past Medical History:  Diagnosis Date  . AAA (abdominal aortic aneurysm) ([place])   . Thyroid disease    thyroid nodules    Surgical History:  Past Surgical History:  Procedure Laterality Date  . BREAST REDUCTION SURGERY  1998  . ESOPHAGOGASTRODUODENOSCOPY  (EGD) WITH PROPOFOL N/A 01/24/2018   Procedure: ESOPHAGOGASTRODUODENOSCOPY (EGD) WITH PROPOFOL;  Surgeon: [name], MD;  Location: [facility] ENDOSCOPY;  Service: Endoscopy;  Laterality: N/A;  . lipoma removal  2007   back    Medications:  Current Outpatient Medications on File Prior to Visit  Medication Sig  . albuterol (PROVENTIL HFA;VENTOLIN HFA) 108 (90 Base) MCG/ACT inhaler Inhale 2 puffs into the lungs every 4 (four) hours as needed.  . fluticasone (FLONASE) 50 MCG/ACT nasal spray Place 2 sprays into both nostrils daily.  . pantoprazole (PROTONIX) 40 MG tablet Take 1 tablet (40 mg total) by mouth daily. In the morning   No current facility-administered medications on file prior to visit.     Allergies:  Allergies  Allergen Reactions  . Codeine     Social History:  Social History   Socioeconomic History  . Marital status: Single    Spouse name: Not on file  . Number of children: Not on file  . Years of education: Not on file  . Highest education level: Not on file  Occupational History  . Not on file  Social Needs  . Financial resource strain: Not on file  . Food insecurity    Worry: Not on file    Inability: Not on file  . Transportation needs  Medical: Not on file    Non-medical: Not on file  Tobacco Use  . Smoking status: Former Smoker    Packs/day: 3.00    Years: 25.00    Pack years: 75.00    Quit date: 04/17/2004    Years since quitting: 14.4  . Smokeless tobacco: Never Used  Substance and Sexual Activity  . Alcohol use: Yes    Alcohol/week: 0.0 standard drinks    Comment: social  . Drug use: No    Comment: past only  . Sexual activity: Not Currently  Lifestyle  . Physical activity    Days per week: Not on file    Minutes per session: Not on file  . Stress: Not on file  Relationships  . Social Herbalist on phone: Not on file    Gets together: Not on file    Attends religious service: Not on file    Active member of club or  organization: Not on file    Attends meetings of clubs or organizations: Not on file    Relationship status: Not on file  . Intimate partner violence    Fear of current or ex partner: Not on file    Emotionally abused: Not on file    Physically abused: Not on file    Forced sexual activity: Not on file  Other Topics Concern  . Not on file  Social History Narrative  . Not on file   Social History   Tobacco Use  Smoking Status Former Smoker  . Packs/day: 3.00  . Years: 25.00  . Pack years: 75.00  . Quit date: 04/17/2004  . Years since quitting: 14.4  Smokeless Tobacco Never Used   Social History   Substance and Sexual Activity  Alcohol Use Yes  . Alcohol/week: 0.0 standard drinks   Comment: social    Family History:  Family History  Problem Relation Age of Onset  . Diabetes Mother   . Diabetes Father   . Heart disease Father   . Heart attack Father   . Cancer Father   . Cancer Paternal Grandmother   . Heart disease Paternal Grandmother   . Cancer Paternal Grandfather   . Heart disease Paternal Grandfather   . Osteoporosis Maternal Grandmother     Past medical history, surgical history, medications, allergies, family history and social history reviewed with patient today and changes made to appropriate areas of the chart.   Review of Systems  Constitutional: Positive for diaphoresis. Negative for chills, fever, malaise/fatigue and weight loss.  HENT: Negative.  Eyes: Negative.  Respiratory: Positive for shortness of breath. Negative for cough, hemoptysis, sputum production and wheezing.  Cardiovascular: Negative.  Gastrointestinal: Negative.  Genitourinary: Negative.  Musculoskeletal: Positive for back pain. Negative for falls, joint pain, myalgias and neck pain.  Skin: Negative.  Neurological: Negative.  Endo/Heme/Allergies: Positive for environmental allergies. Negative for polydipsia. Does not bruise/bleed easily.  Psychiatric/Behavioral: Negative for  depression, hallucinations, memory loss, substance abuse and suicidal ideas. The patient is nervous/anxious. The patient does not have insomnia.   All other ROS negative except what is listed above and in the HPI.      Objective:    BP 125/81 (BP Location: Left Arm, Patient Position: Sitting, Cuff Size: Normal)   Pulse 91   Temp 98.8 F (37.1 C) (Oral)   Ht 5' 3.25" (1.607 m)   Wt 186 lb 3.2 oz (84.5 kg)   SpO2 97%   BMI 32.72 kg/m   Wt Readings from  Last 3 Encounters:  10/04/18 186 lb 3.2 oz (84.5 kg)  09/03/18 179 lb 9.6 oz (81.5 kg)  07/26/18 173 lb (78.5 kg)    Physical Exam Vitals signs and nursing note reviewed. Exam conducted with a chaperone present.  Constitutional:      General: She is not in acute distress.    Appearance: Normal appearance. She is not ill-appearing, toxic-appearing or diaphoretic.  HENT:     Head: Normocephalic and atraumatic.     Right Ear: Tympanic membrane, ear canal and external ear normal. There is no impacted cerumen.     Left Ear: Tympanic membrane, ear canal and external ear normal. There is no impacted cerumen.     Nose: Nose normal. No congestion or rhinorrhea.     Mouth/Throat:     Mouth: Mucous membranes are moist.     Pharynx: Oropharynx is clear. No oropharyngeal exudate or posterior oropharyngeal erythema.  Eyes:     General: No scleral icterus.       Right eye: No discharge.        Left eye: No discharge.     Extraocular Movements: Extraocular movements intact.     Conjunctiva/sclera: Conjunctivae normal.     Pupils: Pupils are equal, round, and reactive to light.  Neck:     Musculoskeletal: Normal range of motion and neck supple. No neck rigidity or muscular tenderness.     Vascular: No carotid bruit.  Cardiovascular:     Rate and Rhythm: Normal rate and regular rhythm.     Pulses: Normal pulses.     Heart sounds: No murmur. No friction rub. No gallop.   Pulmonary:     Effort: Pulmonary effort is normal. No respiratory  distress.     Breath sounds: Normal breath sounds. No stridor. No wheezing, rhonchi or rales.  Chest:     Chest wall: No tenderness.     Breasts:        Right: Normal. No swelling, bleeding, inverted nipple, mass, nipple discharge, skin change or tenderness.        Left: Normal. No swelling, bleeding, inverted nipple, mass, nipple discharge, skin change or tenderness.  Abdominal:     General: Abdomen is flat. Bowel sounds are normal. There is no distension.     Palpations: Abdomen is soft. There is no mass.     Tenderness: There is no abdominal tenderness. There is no right CVA tenderness, left CVA tenderness, guarding or rebound.     Hernia: No hernia is present.  Genitourinary:    Comments: Pelvic exam deferred with shared decision making Musculoskeletal:        General: No swelling, tenderness, deformity or signs of injury.     Right lower leg: No edema.     Left lower leg: No edema.  Lymphadenopathy:     Cervical: No cervical adenopathy.     Upper Body:     Right upper body: No supraclavicular, axillary or pectoral adenopathy.     Left upper body: No supraclavicular, axillary or pectoral adenopathy.  Skin:    General: Skin is warm and dry.     Capillary Refill: Capillary refill takes less than 2 seconds.     Coloration: Skin is not jaundiced or pale.     Findings: No bruising, erythema, lesion or rash.  Neurological:     General: No focal deficit present.     Mental Status: She is alert and oriented to person, place, and time. Mental status is at baseline.  Cranial Nerves: No cranial nerve deficit.     Sensory: No sensory deficit.     Motor: No weakness.     Coordination: Coordination normal.     Gait: Gait normal.     Deep Tendon Reflexes: Reflexes normal.  Psychiatric:        Mood and Affect: Mood normal.        Behavior: Behavior normal.        Thought Content: Thought content normal.        Judgment: Judgment normal.     Results for orders placed or performed  in visit on 10/04/18  SAR CoV2 Serology (COVID 19)AB(IGG)IA   Specimen: Blood  Result Value Ref Range   Abbott SARS-CoV-2 Ab, IgG Negative Negative  CBC with Differential/Platelet  Result Value Ref Range   WBC 7.2 3.4 - 10.8 x10E3/uL   RBC 4.39 3.77 - 5.28 x10E6/uL   Hemoglobin 13.6 11.1 - 15.9 g/dL   Hematocrit 39.5 34.0 - 46.6 %   MCV 90 79 - 97 fL   MCH 31.0 26.6 - 33.0 pg   MCHC 34.4 31.5 - 35.7 g/dL   RDW 13.1 11.7 - 15.4 %   Platelets 271 150 - 450 x10E3/uL   Neutrophils 72 Not Estab. %   Lymphs 19 Not Estab. %   Monocytes 6 Not Estab. %   Eos 1 Not Estab. %   Basos 1 Not Estab. %   Neutrophils Absolute 5.2 1.4 - 7.0 x10E3/uL   Lymphocytes Absolute 1.4 0.7 - 3.1 x10E3/uL   Monocytes Absolute 0.5 0.1 - 0.9 x10E3/uL   EOS (ABSOLUTE) 0.1 0.0 - 0.4 x10E3/uL   Basophils Absolute 0.0 0.0 - 0.2 x10E3/uL   Immature Granulocytes 1 Not Estab. %   Immature Grans (Abs) 0.0 0.0 - 0.1 x10E3/uL  Comprehensive metabolic panel  Result Value Ref Range   Glucose 86 65 - 99 mg/dL   BUN 13 6 - 24 mg/dL   Creatinine, Ser 0.79 0.57 - 1.00 mg/dL   GFR calc non Af Amer 87 >59 mL/min/1.73   GFR calc Af Amer 100 >59 mL/min/1.73   BUN/Creatinine Ratio 16 9 - 23   Sodium 142 134 - 144 mmol/L   Potassium 4.4 3.5 - 5.2 mmol/L   Chloride 102 96 - 106 mmol/L   CO2 27 20 - 29 mmol/L   Calcium 9.9 8.7 - 10.2 mg/dL   Total Protein 6.8 6.0 - 8.5 g/dL   Albumin 4.8 3.8 - 4.9 g/dL   Globulin, Total 2.0 1.5 - 4.5 g/dL   Albumin/Globulin Ratio 2.4 (H) 1.2 - 2.2   Bilirubin Total 0.3 0.0 - 1.2 mg/dL   Alkaline Phosphatase 92 39 - 117 IU/L   AST 28 0 - 40 IU/L   ALT 31 0 - 32 IU/L  Lipid Panel w/o Chol/HDL Ratio  Result Value Ref Range   Cholesterol, Total 219 (H) 100 - 199 mg/dL   Triglycerides 119 0 - 149 mg/dL   HDL 56 >39 mg/dL   VLDL Cholesterol Cal 24 5 - 40 mg/dL   LDL Calculated 139 (H) 0 - 99 mg/dL  Microalbumin, Urine Waived  Result Value Ref Range   Microalb, Ur Waived 30 (H) 0 - 19  mg/L   Creatinine, Urine Waived 100 10 - 300 mg/dL   Microalb/Creat Ratio <30 <30 mg/g  TSH  Result Value Ref Range   TSH 0.982 0.450 - 4.500 uIU/mL  UA/M w/rflx Culture, Routine   Specimen: Urine   URINE  Result Value  Ref Range   Specific Gravity, UA 1.020 1.005 - 1.030   pH, UA 7.0 5.0 - 7.5   Color, UA Yellow Yellow   Appearance Ur Clear Clear   Leukocytes,UA Negative Negative   Protein,UA Negative Negative/Trace   Glucose, UA Negative Negative   Ketones, UA Trace (A) Negative   RBC, UA Negative Negative   Bilirubin, UA Negative Negative   Urobilinogen, Ur 0.2 0.2 - 1.0 mg/dL   Nitrite, UA Negative Negative  VITAMIN D 25 Hydroxy (Vit-D Deficiency, Fractures)  Result Value Ref Range   Vit D, 25-Hydroxy 19.4 (L) 30.0 - 100.0 ng/mL  Bayer DCA Hb A1c Waived  Result Value Ref Range   HB A1C (BAYER DCA - WAIVED) 5.5 <7.0 %  LH  Result Value Ref Range   LH 42.1 mIU/mL  FSH  Result Value Ref Range   FSH 87.5 mIU/mL  Estradiol  Result Value Ref Range   Estradiol 8.8 pg/mL      Assessment & Plan:   Problem List Items Addressed This Visit      Other   Menopause    Not doing well- lots of symptoms. Will start HRT and recheck 1 month. Call with any concerns. Continue to monitor.       Relevant Orders   LH (Completed)   [place] (Completed)   Estradiol (Completed)    Other Visit Diagnoses    Routine general medical examination at a health care facility    -  Primary   Vaccines up to date. Screening labs checked today. Mammogram and colonoscopy up to date. Pap up to date. Continue diet and exercise. Call with any concerns.    Relevant Orders   CBC with Differential/Platelet (Completed)   Comprehensive metabolic panel (Completed)   Lipid Panel w/o Chol/HDL Ratio (Completed)   Microalbumin, Urine Waived (Completed)   TSH (Completed)   UA/M w/rflx Culture, Routine (Completed)   VITAMIN D 25 Hydroxy (Vit-D Deficiency, Fractures) (Completed)   Bayer DCA Hb A1c Waived  (Completed)   History of URI (upper respiratory infection)       Very sick at the begining of March- wants to be checked   Relevant Orders   SAR CoV2 Serology (COVID 19)AB(IGG)IA (Completed)       Follow up plan: Return in about 4 weeks (around 11/01/2018).   LABORATORY TESTING:  - Pap smear: up to date  IMMUNIZATIONS:  - Tdap: Tetanus vaccination status reviewed: last tetanus booster within 10 years. - Influenza: Up to date  SCREENING: -Mammogram: Up to date  - Colonoscopy: Up to date   PATIENT COUNSELING:   Advised to take 1 mg of folate supplement per day if capable of pregnancy.   Sexuality: Discussed sexually transmitted diseases, partner selection, use of condoms, avoidance of unintended pregnancy  and contraceptive alternatives.   Advised to avoid cigarette smoking.  I discussed with the patient that most people either abstain from alcohol or drink within safe limits (<=14/week and <=4 drinks/occasion for males, <=7/weeks and <= 3 drinks/occasion for females) and that the risk for alcohol disorders and other health effects rises proportionally with the number of drinks per week and how often a drinker exceeds daily limits.  Discussed cessation/primary prevention of drug use and availability of treatment for abuse.   Diet: Encouraged to adjust caloric intake to maintain  or achieve ideal body weight, to reduce intake of dietary saturated fat and total fat, to limit sodium intake by avoiding high sodium foods and not adding table salt, and  to maintain adequate dietary potassium and calcium preferably from fresh fruits, vegetables, and low-fat dairy products.    stressed the importance of regular exercise  Injury prevention: Discussed safety belts, safety helmets, smoke detector, smoking near bedding or upholstery.   Dental health: Discussed importance of regular tooth brushing, flossing, and dental visits.    NEXT PREVENTATIVE PHYSICAL DUE IN 1 YEAR. Return in about  4 weeks (around 11/01/2018).

## 2018-10-06 NOTE — Assessment & Plan Note (Signed)
Not doing well- lots of symptoms. Will start HRT and recheck 1 month. Call with any concerns. Continue to monitor.

## 2018-10-07 NOTE — Telephone Encounter (Signed)
PFT scheduled for 12/03/2018 at 4:15 pm [place]. Pt is aware of appointment date, time and location. Rhonda J Cobb

## 2018-10-08 ENCOUNTER — Encounter: Payer: Self-pay | Admitting: Family Medicine

## 2018-10-11 MED ORDER — PREMPRO 0.625-2.5 MG PO TABS: 1.0000 | ORAL_TABLET | Freq: Every day | ORAL | 6 refills | Status: DC

## 2018-10-28 ENCOUNTER — Ambulatory Visit (INDEPENDENT_AMBULATORY_CARE_PROVIDER_SITE_OTHER): Payer: 59 | Admitting: Family Medicine

## 2018-10-28 ENCOUNTER — Encounter: Payer: Self-pay | Admitting: Family Medicine

## 2018-10-28 ENCOUNTER — Other Ambulatory Visit: Payer: Self-pay

## 2018-10-28 VITALS — BP 138/85 | HR 77

## 2018-10-28 DIAGNOSIS — J029 Acute pharyngitis, unspecified: Secondary | ICD-10-CM | POA: Diagnosis not present

## 2018-10-28 MED ORDER — AMOXICILLIN 875 MG PO TABS: 875.0000 mg | ORAL_TABLET | Freq: Two times a day (BID) | ORAL | 0 refills | Status: DC

## 2018-10-28 NOTE — Progress Notes (Signed)
BP 138/85   Pulse 77    Subjective:    Patient ID: Brittany Ochoa, female    DOB: 1965-07-22, 53 y.o.   MRN: [id number]  HPI: Brittany Ochoa is a 53 y.o. female  Chief Complaint  Patient presents with  . Sore Throat    X 3 days, low grade temp    . This visit was completed via WebEx due to the restrictions of the COVID-19 pandemic. All issues as above were discussed and addressed. Physical exam was done as above through visual confirmation on WebEx. If it was felt that the patient should be evaluated in the office, they were directed there. The patient verbally consented to this visit. . Location of the patient: home . Location of the provider: home . Those involved with this call:  . Provider: Merrie Roof, PA-C . CMA: Tiffany Reel, CMA . Front Desk/Registration: Jill Side  . Time spent on call: 15 minutes with patient face to face via video conference. More than 50% of this time was spent in counseling and coordination of care. 5 minutes total spent in review of patient's record and preparation of their chart. I verified patient identity using two factors (patient name and date of birth). Patient consents verbally to being seen via telemedicine visit today.   About 3 days of sore, swollen throat with white spots and redness. Swollen lymph nodes b/l neck, low grade fevers, generalized malaise. Denies CP, SOB, cough, headache, N/V/D. The pain seems to radiate into left ear. 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 Has been wearing appropriate PPE. Has been gargling with salt water with no relief. Was tested for COVID 19 this morning, on quarantine until results are back.   Relevant past medical, surgical, family and social history reviewed and updated as indicated. Interim medical history since our last visit reviewed. Allergies and medications reviewed and updated.  Review of Systems  Per HPI unless specifically indicated above     Objective:    BP  138/85   Pulse 77   Wt Readings from Last 3 Encounters:  10/04/18 186 lb 3.2 oz (84.5 kg)  09/03/18 179 lb 9.6 oz (81.5 kg)  07/26/18 173 lb (78.5 kg)    Physical Exam Vitals signs and nursing note reviewed.  Constitutional:      General: She is not in acute distress.    Appearance: Normal appearance.  HENT:     Head: Atraumatic.     Right Ear: External ear normal.     Left Ear: External ear normal.     Nose: Nose normal. No congestion.     Mouth/Throat:     Mouth: Mucous membranes are moist.     Pharynx: Oropharyngeal exudate (b/l oropharynx) and posterior oropharyngeal erythema present.  Eyes:     Extraocular Movements: Extraocular movements intact.     Conjunctiva/sclera: Conjunctivae normal.  Neck:     Musculoskeletal: Normal range of motion.  Cardiovascular:     Comments: Unable to assess via virtual visit Pulmonary:     Effort: Pulmonary effort is normal. No respiratory distress.  Musculoskeletal: Normal range of motion.  Skin:    General: Skin is dry.     Findings: No erythema.  Neurological:     Mental Status: She is alert and oriented to person, place, and time.  Psychiatric:        Mood and Affect: Mood normal.        Thought Content: Thought content normal.  Judgment: Judgment normal.     Results for orders placed or performed in visit on 10/04/18  SAR CoV2 Serology (COVID 19)AB(IGG)IA   Specimen: Blood  Result Value Ref Range   Abbott SARS-CoV-2 Ab, IgG Negative Negative  CBC with Differential/Platelet  Result Value Ref Range   WBC 7.2 3.4 - 10.8 x10E3/uL   RBC 4.39 3.77 - 5.28 x10E6/uL   Hemoglobin 13.6 11.1 - 15.9 g/dL   Hematocrit 39.5 34.0 - 46.6 %   MCV 90 79 - 97 fL   MCH 31.0 26.6 - 33.0 pg   MCHC 34.4 31.5 - 35.7 g/dL   RDW 13.1 11.7 - 15.4 %   Platelets 271 150 - 450 x10E3/uL   Neutrophils 72 Not Estab. %   Lymphs 19 Not Estab. %   Monocytes 6 Not Estab. %   Eos 1 Not Estab. %   Basos 1 Not Estab. %   Neutrophils Absolute 5.2  1.4 - 7.0 x10E3/uL   Lymphocytes Absolute 1.4 0.7 - 3.1 x10E3/uL   Monocytes Absolute 0.5 0.1 - 0.9 x10E3/uL   EOS (ABSOLUTE) 0.1 0.0 - 0.4 x10E3/uL   Basophils Absolute 0.0 0.0 - 0.2 x10E3/uL   Immature Granulocytes 1 Not Estab. %   Immature Grans (Abs) 0.0 0.0 - 0.1 x10E3/uL  Comprehensive metabolic panel  Result Value Ref Range   Glucose 86 65 - 99 mg/dL   BUN 13 6 - 24 mg/dL   Creatinine, Ser 0.79 0.57 - 1.00 mg/dL   GFR calc non Af Amer 87 >59 mL/min/1.73   GFR calc Af Amer 100 >59 mL/min/1.73   BUN/Creatinine Ratio 16 9 - 23   Sodium 142 134 - 144 mmol/L   Potassium 4.4 3.5 - 5.2 mmol/L   Chloride 102 96 - 106 mmol/L   CO2 27 20 - 29 mmol/L   Calcium 9.9 8.7 - 10.2 mg/dL   Total Protein 6.8 6.0 - 8.5 g/dL   Albumin 4.8 3.8 - 4.9 g/dL   Globulin, Total 2.0 1.5 - 4.5 g/dL   Albumin/Globulin Ratio 2.4 (H) 1.2 - 2.2   Bilirubin Total 0.3 0.0 - 1.2 mg/dL   Alkaline Phosphatase 92 39 - 117 IU/L   AST 28 0 - 40 IU/L   ALT 31 0 - 32 IU/L  Lipid Panel w/o Chol/HDL Ratio  Result Value Ref Range   Cholesterol, Total 219 (H) 100 - 199 mg/dL   Triglycerides 119 0 - 149 mg/dL   HDL 56 >39 mg/dL   VLDL Cholesterol Cal 24 5 - 40 mg/dL   LDL Calculated 139 (H) 0 - 99 mg/dL  Microalbumin, Urine Waived  Result Value Ref Range   Microalb, Ur Waived 30 (H) 0 - 19 mg/L   Creatinine, Urine Waived 100 10 - 300 mg/dL   Microalb/Creat Ratio <30 <30 mg/g  TSH  Result Value Ref Range   TSH 0.982 0.450 - 4.500 uIU/mL  UA/M w/rflx Culture, Routine   Specimen: Urine   URINE  Result Value Ref Range   Specific Gravity, UA 1.020 1.005 - 1.030   pH, UA 7.0 5.0 - 7.5   Color, UA Yellow Yellow   Appearance Ur Clear Clear   Leukocytes,UA Negative Negative   Protein,UA Negative Negative/Trace   Glucose, UA Negative Negative   Ketones, UA Trace (A) Negative   RBC, UA Negative Negative   Bilirubin, UA Negative Negative   Urobilinogen, Ur 0.2 0.2 - 1.0 mg/dL   Nitrite, UA Negative Negative   VITAMIN D 25 Hydroxy (Vit-D  Deficiency, Fractures)  Result Value Ref Range   Vit D, 25-Hydroxy 19.4 (L) 30.0 - 100.0 ng/mL  Bayer DCA Hb A1c Waived  Result Value Ref Range   HB A1C (BAYER DCA - WAIVED) 5.5 <7.0 %  LH  Result Value Ref Range   LH 42.1 mIU/mL  FSH  Result Value Ref Range   FSH 87.5 mIU/mL  Estradiol  Result Value Ref Range   Estradiol 8.8 pg/mL      Assessment & Plan:   Problem List Items Addressed This Visit    None    Visit Diagnoses    Pharyngitis, unspecified etiology    -  Primary   Sxs consistent with strep throat, tx with amoxil and await COVID 19 results. Cannot r/o thrush so monitor for improvement on abx. F/u if not better       Follow up plan: Return if symptoms worsen or fail to improve.

## 2018-11-07 ENCOUNTER — Emergency Department: Payer: 59

## 2018-11-07 ENCOUNTER — Encounter: Payer: Self-pay | Admitting: *Deleted

## 2018-11-07 ENCOUNTER — Ambulatory Visit: Payer: Self-pay | Admitting: *Deleted

## 2018-11-07 ENCOUNTER — Other Ambulatory Visit: Payer: Self-pay

## 2018-11-07 ENCOUNTER — Emergency Department
Admission: EM | Admit: 2018-11-07 | Discharge: 2018-11-07 | Disposition: A | Discharge status: Home / Self Care | Payer: 59 | Attending: Emergency Medicine | Admitting: Emergency Medicine

## 2018-11-07 DIAGNOSIS — Z5321 Procedure and treatment not carried out due to patient leaving prior to being seen by health care provider: Secondary | ICD-10-CM | POA: Insufficient documentation

## 2018-11-07 DIAGNOSIS — I10 Essential (primary) hypertension: Secondary | ICD-10-CM | POA: Insufficient documentation

## 2018-11-07 DIAGNOSIS — R42 Dizziness and giddiness: Secondary | ICD-10-CM | POA: Diagnosis not present

## 2018-11-07 LAB — BASIC METABOLIC PANEL
Anion gap: 10 (ref 5–15)
BUN: 11 mg/dL (ref 6–20)
CO2: 26 mmol/L (ref 22–32)
Calcium: 9.2 mg/dL (ref 8.9–10.3)
Chloride: 103 mmol/L (ref 98–111)
Creatinine, Ser: 0.47 mg/dL (ref 0.44–1.00)
GFR calc Af Amer: >60 mL/min (ref >60)
GFR calc non Af Amer: >60 mL/min (ref >60)
Glucose, Bld: 95 mg/dL (ref 70–99)
Potassium: 4.1 mmol/L (ref 3.5–5.1)
Sodium: 139 mmol/L (ref 135–145)

## 2018-11-07 LAB — CBC
HCT: 40.3 % (ref 36.0–46.0)
Hemoglobin: 13.7 g/dL (ref 12.0–15.0)
MCH: 31.1 pg (ref 26.0–34.0)
MCHC: 34 g/dL (ref 30.0–36.0)
MCV: 91.6 fL (ref 80.0–100.0)
Platelets: 233 10*3/uL (ref 150–400)
RBC: 4.4 MIL/uL (ref 3.87–5.11)
RDW: 13 % (ref 11.5–15.5)
WBC: 6.3 10*3/uL (ref 4.0–10.5)
nRBC: 0 % (ref 0.0–0.2)

## 2018-11-07 LAB — TROPONIN I (HIGH SENSITIVITY): Troponin I (High Sensitivity): <2 ng/L (ref <18)

## 2018-11-07 NOTE — Telephone Encounter (Signed)
Patient calls with b/p 160/100 now along with headache/lightheadedness. "feeling of flutter between her breast."Pressure has been elevated most of the day. Has aortic aneurysm. No history of HTN.Denies vision changes/SOB. Referred patient to [facility] ED at this time.  Reason for Disposition . [0] Systolic BP  >= 998 OR Diastolic >= 338 AND [2] cardiac or neurologic symptoms (e.g., chest pain, difficulty breathing, unsteady gait, blurred vision)  Answer Assessment - Initial Assessment Questions 1. BLOOD PRESSURE: "What is the blood pressure?" "Did you take at least two measurements 5 minutes apart?"    160/100 now 167/96 noon. 2. ONSET: "When did you take your blood pressure?"     now 3. HOW: "How did you obtain the blood pressure?" (e.g., visiting nurse, automatic home BP monitor)     At work 4. HISTORY: "Do you have a history of high blood pressure?"    no 5. MEDICATIONS: "Are you taking any medications for blood pressure?" "Have you missed any doses recently?"      6. OTHER SYMPTOMS: "Do you have any symptoms?" (e.g., headache, chest pain, blurred vision, difficulty breathing, weakness)     Lightheaded, center of chest "feels weird" 7. PREGNANCY: "Is there any chance you are pregnant?" "When was your last menstrual period?"     no  Protocols used: HIGH BLOOD PRESSURE-A-AH

## 2018-11-07 NOTE — ED Triage Notes (Addendum)
Pt ambulatory to triage.  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 Pt 's blood pressure was elevated.  Pt has ascending aortic aneurysm.  No chest pain.  No sob at this time.  No cough.  Pt alert  Speech clear.

## 2018-11-08 ENCOUNTER — Other Ambulatory Visit: Payer: Self-pay

## 2018-11-09 ENCOUNTER — Other Ambulatory Visit: Payer: Self-pay

## 2018-11-09 ENCOUNTER — Encounter: Payer: Self-pay | Admitting: Emergency Medicine

## 2018-11-09 ENCOUNTER — Emergency Department
Admission: EM | Admit: 2018-11-09 | Discharge: 2018-11-09 | Discharge status: Against Medical Advice | Payer: 59 | Attending: Emergency Medicine | Admitting: Emergency Medicine

## 2018-11-09 DIAGNOSIS — Z5321 Procedure and treatment not carried out due to patient leaving prior to being seen by health care provider: Secondary | ICD-10-CM | POA: Diagnosis not present

## 2018-11-09 DIAGNOSIS — I10 Essential (primary) hypertension: Secondary | ICD-10-CM | POA: Diagnosis present

## 2018-11-09 NOTE — ED Triage Notes (Signed)
Pt to ED via POV c/o HTN. Pt states that she was in ED on Thursday but was unable to stay and wait. Pt states that her BP was high on Thursday and was high again today. Pt states that her diastolic BP was 003. Pt is alert and speech is clear. Pt is in NAD.

## 2018-11-11 ENCOUNTER — Telehealth: Payer: Self-pay | Admitting: Emergency Medicine

## 2018-11-11 ENCOUNTER — Telehealth: Payer: Self-pay | Admitting: Cardiovascular Disease

## 2018-11-11 NOTE — Telephone Encounter (Signed)
Called patient due to lwot to inquire about condition and follow up plans. Says she has a call in to her doctor now and will follow up.

## 2018-11-11 NOTE — Telephone Encounter (Signed)
Pt c/o BP issue: STAT if pt c/o blurred vision, one-sided weakness or slurred speech  1. What are your last 5 BP readings?   7/27 140/90 range  7/26 140/90 range all day  7/25 178/100- went to hospital, once she was seen it was 142/88   7/24 122/82  7/23 167/100  2. Are you having any other symptoms (ex. Dizziness, headache, blurred vision, passed out)? Drowsiness, when bp is high  Patient "felt like body was light from toes to head", occasional pain in head (thinks it could be sinus related)  3. What is your BP issue? Unusual high bp for patients normal   Patient has aortic aneurysm and is worried

## 2018-11-11 NOTE — Telephone Encounter (Addendum)
Spoke with patient. Gained 20 lb because gym is closed and not exercising regularly. Last Thursday (7/23) while at work, she felt lightheaded and BP was 168/100. Went to the ED. It remained elevated through the day until the evening when it came back to normal after she ate a salad.  Went to the ED again on Saturday (7/25) for high BP and by the time she was seen it went down to 142/88. Ranging between 135-145/88-99. Says the spikes in her BP about a year ago.  Her goal is to get back to eating healthy, working out, and not having to be on BP medications. She wants to lose weight.   Denies any blurred vision, shortness of breath, or chest pain. She is scheduled to see Ignacia Bayley, NP on 11/13/18.       COVID-19 Pre-Screening Questions:  . In the past 7 to 10 days have you had a cough,  shortness of breath, headache, congestion, fever (100 or greater) body aches, chills, sore throat, or sudden loss of taste or sense of smell?  NO . Have you been around anyone with known Covid 19? NO  . Have you been around anyone who is awaiting Covid 19 test results in the past 7 to 10 days?  NO   . Have you been around anyone who has been exposed to Covid 19, or has mentioned symptoms of Covid 19 within the past 7 to 10 days?  Yes, but the person tested and was negative. The patient is employee at [employer] and has passed the Health at Work screening concerning co-worker who was tested and negative.

## 2018-11-13 ENCOUNTER — Ambulatory Visit (INDEPENDENT_AMBULATORY_CARE_PROVIDER_SITE_OTHER): Payer: 59 | Admitting: Family

## 2018-11-13 ENCOUNTER — Other Ambulatory Visit: Payer: Self-pay

## 2018-11-13 ENCOUNTER — Encounter: Payer: Self-pay | Admitting: Family

## 2018-11-13 VITALS — BP 138/88 | HR 72 | Ht 63.0 in | Wt 192.0 lb

## 2018-11-13 DIAGNOSIS — I712 Thoracic aortic aneurysm, without rupture: Secondary | ICD-10-CM

## 2018-11-13 DIAGNOSIS — I10 Essential (primary) hypertension: Secondary | ICD-10-CM

## 2018-11-13 DIAGNOSIS — I340 Nonrheumatic mitral (valve) insufficiency: Secondary | ICD-10-CM

## 2018-11-13 DIAGNOSIS — I7121 Aneurysm of the ascending aorta, without rupture: Secondary | ICD-10-CM

## 2018-11-13 MED ORDER — LOSARTAN POTASSIUM 25 MG PO TABS: 25.0000 mg | ORAL_TABLET | Freq: Every day | ORAL | 3 refills | Status: DC

## 2018-11-13 NOTE — Progress Notes (Signed)
Office Visit    Patient Name: Brittany Ochoa Date of Encounter: 11/13/2018  Primary Care Provider:  [name], DO Primary Cardiologist:  Kathlyn Sacramento, MD  Chief Complaint    53 yo female with history of GERD, aortic ascending aneurysm with chief complaint of elevated blood pressures.   Past Medical History    Past Medical History:  Diagnosis Date  . AAA (abdominal aortic aneurysm) ([place])   . Thyroid disease    thyroid nodules   Past Surgical History:  Procedure Laterality Date  . BREAST REDUCTION SURGERY  1998  . ESOPHAGOGASTRODUODENOSCOPY (EGD) WITH PROPOFOL N/A 01/24/2018   Procedure: ESOPHAGOGASTRODUODENOSCOPY (EGD) WITH PROPOFOL;  Surgeon: [name], MD;  Location: [facility] ENDOSCOPY;  Service: Endoscopy;  Laterality: N/A;  . lipoma removal  2007   back    Allergies  Allergies  Allergen Reactions  . Codeine     History of Present Illness    Brittany Ochoa is a 53 yo female with PMH ascending aortic aneurysm, GERD. Last seen by Dr. Fletcher Anon 06/2017 for palpitations, exertional dyspnea. Recommended for follow up as needed at that time. Seen today for chief complaint of elevated BP.   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 Former smoker with quit date 2006. She follows with pulmonology (PFTs upcoming), GI, and CT surgery. Seen by Dr. Cyndia Bent 09/06/18 noting stable fusiform ascending oartic aneurysm with maximum diameter of 4.1 x 4.0 cm. Plan to consider surgery around 4.5-5cm due to small descending aortic size.  Seen by her PCP 10/30/18 for pharyngitis and completed course of Amoxicillin. Was recently recommended HRT for menopause by her PCP, but wanted to check with cardiology before starting Prempro.   Telephone encounter 11/07/18 with reported BP 160/100 and 167/96 associated with lightheadedness. Advised to proceed to ED. She has been to the [facility] ED 11/07/18 and 11/09/18 for elevated BP, but not stayed to be evaluated on either  occasion as she tells me her "BP is normal by the time they get me back there".   When she has episodes of high blood pressure she states she "didn't feel well", but unable to describe the character of these episodes. She has purchased a blood pressure cuff and has been checking her BP at home with systolic BP anywhere [id number] and diastolic 41L-24M. Most recent reading 134/89. Discussed possible etiology of elevated blood pressure including hormonal changes with menopause, stress, anxiety, family history. She verbalizes understanding of goal BP <130/80 in the setting of ascending aortic aneurysm.   Tells me one of her family members asked her the other day if she was anxious because she was "talking so fast". Tells me she does not think she is anxious, but is surprised to learn this can affect one's blood pressure. She reports intermittent feeling in her chest like she is "amped up", but no chest pain. She has no shortness of breath, no palpitations, no edema.   Endorses she has gained some weight which she attributes to menopause. Is hopeful HRT therapy will help. She is planning to return to her exercise regimen. Has been watching her diet by juicing and eating lots of salads.  Endorses family history of HTN in her father, maternal grandfather, and both paternal grandparents.   EKGs/Labs/Other Studies Reviewed:   The following studies were reviewed today: Echo 06/2017 - Left ventricle: The cavity size was normal. There was mild   concentric hypertrophy. Systolic function was normal. The   estimated ejection fraction was  in the range of 60% to 65%. Wall   motion was normal; there were no regional wall motion   abnormalities. Left ventricular diastolic function parameters   were normal. - Aorta: Ascending aortic diameter: 39 mm (S). - Mitral valve: There was mild regurgitation. CTA chest 06/2018 IMPRESSION: The tubular thoracic aorta measures 4.1 x 4.0 cm, unchanged from prior.  EKG:  EKG  is  ordered today.  The ekg ordered today demonstrates sinus rhythm with low voltage QRS stable compared to previous.   Recent Labs: 10/04/2018: ALT 31; TSH 0.982 11/07/2018: BUN 11; Creatinine, Ser 0.47; Hemoglobin 13.7; Platelets 233; Potassium 4.1; Sodium 139  Recent Lipid Panel    Component Value Date/Time   CHOL 219 (H) 10/04/2018 1600   TRIG 119 10/04/2018 1600   HDL 56 10/04/2018 1600   LDLCALC 139 (H) 10/04/2018 1600    Home Medications   Prior to Admission medications   Medication Sig Start Date End Date Taking? Authorizing Provider  albuterol (PROVENTIL HFA;VENTOLIN HFA) 108 (90 Base) MCG/ACT inhaler Inhale 2 puffs into the lungs every 4 (four) hours as needed. 07/22/18   Darlin Priestly, PA-C  amoxicillin (AMOXIL) 875 MG tablet Take 1 tablet (875 mg total) by mouth 2 (two) times daily. 10/28/18   Volney American, PA-C  estrogen, conjugated,-medroxyprogesterone (PREMPRO) 0.625-2.5 MG tablet Take 1 tablet by mouth daily. Patient not taking: Reported on 10/28/2018 10/11/18   Park Liter P, DO  fluticasone (FLONASE) 50 MCG/ACT nasal spray Place 2 sprays into both nostrils daily. 07/22/18   Darlin Priestly, PA-C  pantoprazole (PROTONIX) 40 MG tablet Take 1 tablet (40 mg total) by mouth daily. In the morning 09/03/18   Tyler Pita, MD     Review of Systems    Review of Systems  Constitution: Positive for weight gain (due to menopause). Negative for chills, fever and malaise/fatigue.  Cardiovascular: Negative for chest pain, dyspnea on exertion, irregular heartbeat, leg swelling, near-syncope and palpitations.       (+) elevated BP readings  Respiratory: Negative for cough, shortness of breath and wheezing.   Gastrointestinal: Negative for nausea and vomiting.  Neurological: Negative for dizziness, light-headedness and weakness.   All other systems reviewed and are otherwise negative except as noted above.  Physical Exam    VS:  BP 138/88 (BP Location: Left Arm,  Patient Position: Sitting, Cuff Size: Normal)   Pulse 72   Ht 5\' 3"  (1.6 m)   Wt 192 lb (87.1 kg)   LMP  (LMP Unknown)   BMI 34.01 kg/m  , BMI Body mass index is 34.01 kg/m. GEN: Well nourished, well developed, in no acute distress. HEENT: normal. Neck: Supple, no JVD, carotid bruits, or masses. Cardiac: RRR, no murmurs, rubs, or gallops. No clubbing, cyanosis, edema.  Radials/DP/PT 2+ and equal bilaterally.  Respiratory:  Respirations regular and unlabored, clear to auscultation bilaterally. GI: Soft, nontender, nondistended, BS + x 4. MS: no deformity or atrophy. Skin: warm and dry, no rash. Neuro:  Strength and sensation are intact. Psych: Normal affect.  Accessory Clinical Findings    ECG personally reviewed by me today - SR rate 72 with low voltage QRS stable compared to previous -  no acute changes.  Assessment & Plan    1.  HTN - Elevated BP readings with two ED visits in the last week for which she did not stay to be evaluated. BP as high as 167/96. Strong family history of HTN. Purchased home arm cuff with readings 120s-160s/80s-90s. She  is agreeable to keep a BP log. Initiate Losartan 25mg  daily. Hesitant to utilize beta blocker as first line due to current work up with pulmonology, as below. Normal kidney function 11/07/18 - recheck BMP in 1 week. She will report BP in 2 weeks. Verbalized understanding of BP goal <130/80 in setting of aortic aneurysm.   2. Dyspnea - Noted history. Reports no new onset dyspnea, denies shortness of breath, no chest pain. Lung sounds clear on exam. Echo with normal LV function 06/2017. She is following with pulmonology and has PFTs coming up. 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 Present treatment with management of GERD. Given this history hesitant to utilize beta blocker as first line agent in treatment of HTN.   3. Ascending aortic aneurysm - Follows with Dr.  Cyndia Bent. CTA 06/2018 with measurements 4.1 x 4.0 cm. Plan for repeat CTA in one year and consider surgery around 4.5-5 cm due to small descending aortic size.   3. GERD - Follows with GI. Has stopped her Protonix. Has been taking Pepcid in the evening as needed.   4. MR - Mild by echo 06/2017. No new onset SOB. No murmur on exam.  Disposition: BMP 1 week. Send BP log in 2 weeks. Virtual visit with Dr. Fletcher Anon in 4 week.    Loel Dubonnet, NP 11/13/2018, 4:27 PM

## 2018-11-13 NOTE — Patient Instructions (Signed)
Medication Instructions:  Your physician has recommended you make the following change in your medication:  1- START Losartan Take 1 tablet (25 mg total) by mouth daily If you need a refill on your cardiac medications before your next appointment, please call your pharmacy.   Lab work: Your physician recommends that you return for lab work in: 1 weeks at the [facility] mall[facility])No appt is needed. Hours are M-F 7AM- 6 PM.  If you have labs (blood work) drawn today and your tests are completely normal, you will receive your results only by: Marland Kitchen MyChart Message (if you have MyChart) OR . A paper copy in the mail If you have any lab test that is abnormal or we need to change your treatment, we will call you to review the results.  Testing/Procedures: None ordered   Follow-Up: At [facility], you and your health needs are our priority.  As part of our continuing mission to provide you with exceptional heart care, we have created designated Provider Care Teams.  These Care Teams include your primary Cardiologist (physician) and Advanced Practice Providers (APPs -  Physician Assistants and Nurse Practitioners) who all work together to provide you with the care you need, when you need it. You will need a follow up ([id number] or virtual) appointment in 2 weeks. You may see Kathlyn Sacramento, MD or Murray Hodgkins, NP  Any Other Special Instructions Will Be Listed Below (If Applicable). Call the clinic in 2 weeks with BP readings.  How to use a home blood pressure monitor. . Be still. Don't smoke, drink caffeinated beverages or exercise within 30 minutes before measuring your blood pressure. . Sit correctly. Sit with your back straight and supported (on a dining chair, rather than a sofa). Your feet should be flat on the floor and your legs should not be crossed. Your arm should be supported on a flat surface (such as a table) with the upper arm at heart level. Make sure the bottom of the cuff is placed  directly above the bend of the elbow.  . Measure at the same time every day. It's important to take the readings at the same time each day, such as morning and evening. Take reading approximately 1 hour after BP medications.

## 2018-11-15 ENCOUNTER — Other Ambulatory Visit: Payer: Self-pay

## 2018-11-21 ENCOUNTER — Telehealth: Payer: Self-pay | Admitting: Cardiovascular Disease

## 2018-11-21 NOTE — Telephone Encounter (Signed)
Attempted to call patient. LMTCB 11/21/2018

## 2018-11-21 NOTE — Telephone Encounter (Signed)
Pt c/o BP issue: STAT if pt c/o blurred vision, one-sided weakness or slurred speech  1. What are your last 5 BP readings? 140/90 range in afternoon, mornings 120/80 up to 138/90, just took it and it was 159/89 at lunch  2. Are you having any other symptoms (ex. Dizziness, headache, blurred vision, passed out)? Dizziness, headaches, back pains upper left side, has to focus on not passing out (so far 2x this week), patient feels anxious  3. What is your BP issue? Started new medication on 11/17/18, Losartan   Patient is wondering is there a BP monitor patient could wear. Please advise

## 2018-11-21 NOTE — Telephone Encounter (Signed)
Patient calling back, please call back at [phone]

## 2018-11-22 NOTE — Telephone Encounter (Signed)
Call to patient, she reports HTN episodes over the past week. Sometimes at work, sometimes at home. Has happened at rest. Feels suddenly dizzy and headache. Last around 1 hr, then BP goes down. She denies stress at that time.   Yesterday at spike was 158/93. Then hovers in 982M systolic.   Pt reports the is taking losartan 25 mg in the evening. I suggested that she start taking in the morning and see if it improves. Pt agreed.   She will call back first of next week for update.  Advised pt to call for any further questions or concerns.

## 2018-11-26 ENCOUNTER — Other Ambulatory Visit: Payer: Self-pay

## 2018-11-26 ENCOUNTER — Telehealth: Payer: Self-pay | Admitting: Pulmonary Disease

## 2018-11-26 NOTE — Telephone Encounter (Signed)
Pt is aware of date/time for covid testing. Nothing further is needed.

## 2018-11-29 ENCOUNTER — Other Ambulatory Visit: Payer: Self-pay

## 2018-11-29 ENCOUNTER — Other Ambulatory Visit
Admission: RE | Admit: 2018-11-29 | Discharge: 2018-11-29 | Disposition: A | Discharge status: Home / Self Care | Payer: 59 | Source: Ambulatory Visit | Attending: Pulmonary Disease | Admitting: Pulmonary Disease

## 2018-11-29 DIAGNOSIS — Z01812 Encounter for preprocedural laboratory examination: Secondary | ICD-10-CM | POA: Diagnosis not present

## 2018-11-29 DIAGNOSIS — Z20828 Contact with and (suspected) exposure to other viral communicable diseases: Secondary | ICD-10-CM | POA: Insufficient documentation

## 2018-11-29 LAB — SARS CORONAVIRUS 2 (TAT 6-24 HRS): SARS Coronavirus 2: NEGATIVE

## 2018-12-03 ENCOUNTER — Ambulatory Visit: Payer: 59

## 2018-12-03 ENCOUNTER — Other Ambulatory Visit: Payer: Self-pay

## 2018-12-03 ENCOUNTER — Ambulatory Visit: Payer: 59 | Attending: Pulmonary Disease

## 2018-12-03 DIAGNOSIS — R0600 Dyspnea, unspecified: Secondary | ICD-10-CM | POA: Diagnosis not present

## 2018-12-03 DIAGNOSIS — Z87891 Personal history of nicotine dependence: Secondary | ICD-10-CM | POA: Insufficient documentation

## 2018-12-06 ENCOUNTER — Encounter: Payer: Self-pay | Admitting: Family Medicine

## 2018-12-11 ENCOUNTER — Encounter: Payer: Self-pay | Admitting: Pulmonary Disease

## 2018-12-11 ENCOUNTER — Encounter: Payer: Self-pay | Admitting: Family Medicine

## 2018-12-11 ENCOUNTER — Other Ambulatory Visit: Payer: Self-pay

## 2018-12-11 ENCOUNTER — Ambulatory Visit: Payer: 59 | Admitting: Pulmonary Disease

## 2018-12-11 VITALS — BP 126/70 | HR 74 | Temp 97.2°F | Ht 63.0 in | Wt 194.6 lb

## 2018-12-11 DIAGNOSIS — R002 Palpitations: Secondary | ICD-10-CM

## 2018-12-11 DIAGNOSIS — R0600 Dyspnea, unspecified: Secondary | ICD-10-CM

## 2018-12-11 DIAGNOSIS — I712 Thoracic aortic aneurysm, without rupture: Secondary | ICD-10-CM

## 2018-12-11 DIAGNOSIS — K2100 Gastro-esophageal reflux disease with esophagitis, without bleeding: Secondary | ICD-10-CM

## 2018-12-11 DIAGNOSIS — I7121 Aneurysm of the ascending aorta, without rupture: Secondary | ICD-10-CM

## 2018-12-11 NOTE — Patient Instructions (Signed)
Follow up as needed

## 2018-12-11 NOTE — Telephone Encounter (Signed)
Pt called in to schedule appt, she states that she can only do 3:30-4:00 due to work schedule and is willing to do virtual. I scheduled her for 12/30/2018, however pt is wondering if provider can fit her in earlier. Please advise.

## 2018-12-12 ENCOUNTER — Ambulatory Visit (INDEPENDENT_AMBULATORY_CARE_PROVIDER_SITE_OTHER): Payer: 59 | Admitting: Nurse Practitioner

## 2018-12-12 ENCOUNTER — Encounter: Payer: Self-pay | Admitting: Nurse Practitioner

## 2018-12-12 ENCOUNTER — Telehealth: Payer: Self-pay

## 2018-12-12 ENCOUNTER — Ambulatory Visit (INDEPENDENT_AMBULATORY_CARE_PROVIDER_SITE_OTHER): Payer: 59

## 2018-12-12 VITALS — BP 128/82 | HR 72 | Ht 63.0 in | Wt 196.5 lb

## 2018-12-12 DIAGNOSIS — I712 Thoracic aortic aneurysm, without rupture: Secondary | ICD-10-CM | POA: Diagnosis not present

## 2018-12-12 DIAGNOSIS — R002 Palpitations: Secondary | ICD-10-CM

## 2018-12-12 DIAGNOSIS — R0609 Other forms of dyspnea: Secondary | ICD-10-CM | POA: Diagnosis not present

## 2018-12-12 DIAGNOSIS — I7121 Aneurysm of the ascending aorta, without rupture: Secondary | ICD-10-CM

## 2018-12-12 DIAGNOSIS — R0600 Dyspnea, unspecified: Secondary | ICD-10-CM

## 2018-12-12 NOTE — Telephone Encounter (Signed)
Copied from Icard [id number]. Topic: General - Inquiry >> Dec 12, 2018  9:17 AM Richardo Priest, NT wrote: Reason for CRM: Patient called in stating she is wanting answers ASAP in regards to her medication. Patient stated she has been waiting for clarification for days now and does not want to wait for her appointment on 9/14. Patient canceled appointment. Patient stated she is on day 3 of her very heavy menstrual cycle and finds it alarming due to not having one in almost a year. Patient stated she is very bloated and having cramps. Pt also wanted to inform PCP that she has 5 days left of medication and is needing to know if she is going to be renewing the medication or if she is being changed to something else. Please advise as patient is irate and wanting a message back ASAP.   Routing to provider to advise. Can we work patient in somewhere?

## 2018-12-12 NOTE — Telephone Encounter (Signed)
Called and spoke with patient. Appointment made for 12/16/18 at 11:15. Patient states she is going to try to get off work, but will call back if she can't.

## 2018-12-12 NOTE — Progress Notes (Signed)
Office Visit    Patient Name: Brittany Ochoa Date of Encounter: 12/12/2018  Primary Care Provider:  [name], DO Primary Cardiologist:  Kathlyn Sacramento, MD  Chief Complaint    53 year old female with a history of GERD, ascending aortic aneurysm, and hypertension, who follows up for hypertension.  Past Medical History    Past Medical History:  Diagnosis Date  . AAA (abdominal aortic aneurysm) ([place])    a. 06/2018 CTA Chest: 4.1 x 4.0 cm fusiform Asc Ao aneurysm-unchanged.  Descending thoracic aorta 2.1 cm.  Marland Kitchen Dyspnea on exertion    a. 06/2017 echo: EF 60 to 65%.  No regional wall motion abnormalities.  Ascending aortic diameter 3.9 cm.  Mild MR.  . Multiple thyroid nodules   . Reflux esophagitis    Past Surgical History:  Procedure Laterality Date  . BREAST REDUCTION SURGERY  1998  . ESOPHAGOGASTRODUODENOSCOPY (EGD) WITH PROPOFOL N/A 01/24/2018   Procedure: ESOPHAGOGASTRODUODENOSCOPY (EGD) WITH PROPOFOL;  Surgeon: [name], MD;  Location: [facility] ENDOSCOPY;  Service: Endoscopy;  Laterality: N/A;  . lipoma removal  2007   back    Allergies  Allergies  Allergen Reactions  . Codeine     History of Present Illness    53 year old female with the above past medical history including GERD, ascending aortic aneurysm, and hypertension.  She previously smoked cigarettes but quit in 2006.  She is currently being followed by CT surgery for stable fusiform ascending aortic aneurysm with a maximum diameter 4.1 x 4.0 cm earlier this year in May.  She was last seen in clinic in July in the setting of elevated blood pressure readings at home.  She was placed on losartan 25 mg daily and has since been following her blood pressures.  She was supposed to have had a basic metabolic panel 1 week after initiation of losartan however, this was not carried out.  She is scheduled for labs with her primary care provider next Monday and will asked to have a basic metabolic panel  drawn at that time.  Since starting losartan, her blood pressure has been trending in the 1 teens.  Since her last visit, she has been experiencing which she describes worsening dyspnea on exertion and notes actually that this is been present for at least 3 months or so, since we have been wearing masks at work.  She does not typically notice it at home and has been exercising at home without any significant limitations.  Periodically, she notes what she describes as a vibrating like a sensation in her chest that can be associated with dyspnea and last several minutes prior to resolving spontaneously.  When asked if this feels like her heart is fluttering, she says no but other times, she has palpitations that are bothersome.  She denies chest pain, PND, orthopnea, dizziness, syncope, or early satiety.  She sometimes notes mild facial, hand, and ankle swelling first thing in the morning which resolves shortly after getting out of bed.  Home Medications    Prior to Admission medications   Medication Sig Start Date End Date Taking? Authorizing Provider  albuterol (PROVENTIL HFA;VENTOLIN HFA) 108 (90 Base) MCG/ACT inhaler Inhale 2 puffs into the lungs every 4 (four) hours as needed. 07/22/18  Yes Darlin Priestly, PA-C  estrogen, conjugated,-medroxyprogesterone (PREMPRO) 0.625-2.5 MG tablet Take 1 tablet by mouth daily. 10/11/18  Yes Johnson, Megan P, DO  losartan (COZAAR) 25 MG tablet Take 1 tablet (25 mg total) by mouth daily. 11/13/18 02/11/19 Yes  Theora Gianotti, NP    Review of Systems    Dyspnea on exertion which has been worsened since having to wear a mask to work.  She has palpitations and also an intermittent sensation of vibrating in her chest with associated dyspnea.  She distinguishes between palpitations and these vibrating symptoms.  She denies PND, chest pain, orthopnea, dizziness, syncope, edema, or early satiety.  All other systems reviewed and are otherwise negative except as  noted above.  Physical Exam    VS:  BP 128/82 (BP Location: Left Arm, Patient Position: Sitting, Cuff Size: Normal)   Pulse 72   Ht 5\' 3"  (1.6 m)   Wt 196 lb 8 oz (89.1 kg)   LMP  (LMP Unknown)   BMI 34.81 kg/m  , BMI Body mass index is 34.81 kg/m. GEN: Well nourished, well developed, in no acute distress. HEENT: normal. Neck: Supple, no JVD, carotid bruits, or masses. Cardiac: RRR, no murmurs, rubs, or gallops. No clubbing, cyanosis, edema.  Radials/PT 2+ and equal bilaterally.  Respiratory:  Respirations regular and unlabored, clear to auscultation bilaterally. GI: Soft, nontender, nondistended, BS + x 4. MS: no deformity or atrophy. Skin: warm and dry, no rash. Neuro:  Strength and sensation are intact. Psych: Normal affect.  Accessory Clinical Findings    ECG personally reviewed by me today -regular sinus rhythm, 72, leftward axis - no acute changes.  Lab Results  Component Value Date   WBC 6.3 11/07/2018   HGB 13.7 11/07/2018   HCT 40.3 11/07/2018   MCV 91.6 11/07/2018   PLT 233 11/07/2018   Lab Results  Component Value Date   CREATININE 0.47 11/07/2018   BUN 11 11/07/2018   NA 139 11/07/2018   K 4.1 11/07/2018   CL 103 11/07/2018   CO2 26 11/07/2018   Lab Results  Component Value Date   ALT 31 10/04/2018   AST 28 10/04/2018   ALKPHOS 92 10/04/2018   BILITOT 0.3 10/04/2018   Lab Results  Component Value Date   CHOL 219 (H) 10/04/2018   HDL 56 10/04/2018   LDLCALC 139 (H) 10/04/2018   TRIG 119 10/04/2018     Assessment & Plan    1.  Palpitations: Patient notes that she has been having bothersome palpitations which are often brief in nature and resolve spontaneously.  Based on her description, I suspect she may have a combination of PACs and PVCs.  She also describes episodes of a vibrating sensation in her chest associated with dyspnea.  She says it feels like an anxious feeling and can last a few minutes prior to resolving.  She has checked her heart  rate and blood pressure during those particular episodes and both have been normal.  We agreed to place a 14-day ZIO monitor to assess her heart rhythm during both types of episodes.  2.  Dyspnea on exertion: She says this is worsened over the past few months and seems to be exacerbated by wearing a mask.  She has not had any chest pain.  Echocardiogram showed nl EF last March.  Euvolemic on exam.  Followed by pulm, but PFT's nl last week.  I offered stress testing to r/o coronary ischemia, however, she'd prefer to wait and see how monitoring turns out.  3.  Asc Ao Aneurysm:  Stable on CT in March.  Followed by CT surgery.  4.  Dispo:  F/u Zio monitor.  Will need f/u bmet since she missed bmet earlier this month after starting losartan.  I offered to order today, however, she says that she is due for labs on Monday w/ primary care and will see if she can get it drawn then.  F/u in clinic in 4-6 wks.  Murray Hodgkins, NP 12/12/2018, 5:10 PM

## 2018-12-12 NOTE — Patient Instructions (Signed)
Medication Instructions:  Your physician recommends that you continue on your current medications as directed. Please refer to the Current Medication list given to you today.  If you need a refill on your cardiac medications before your next appointment, please call your pharmacy.   Lab work: None ordered  If you have labs (blood work) drawn today and your tests are completely normal, you will receive your results only by: Marland Kitchen MyChart Message (if you have MyChart) OR . A paper copy in the mail If you have any lab test that is abnormal or we need to change your treatment, we will call you to review the results.  Testing/Procedures: 1- A zio monitor was placed today. It will remain on for 14 days. You will then return monitor and event diary in provided box. It takes 1-2 weeks for report to be downloaded and returned to [place]. We will call you with the results. If monitor falls of or has orange flashing light, please call Zio for further instructions.     Follow-Up: At [facility], you and your health needs are our priority.  As part of our continuing mission to provide you with exceptional heart care, we have created designated Provider Care Teams.  These Care Teams include your primary Cardiologist (physician) and Advanced Practice Providers (APPs -  Physician Assistants and Nurse Practitioners) who all work together to provide you with the care you need, when you need it. You will need a follow up appointment in 4-6 weeks.  Please see Kathlyn Sacramento, MD .

## 2018-12-12 NOTE — Telephone Encounter (Signed)
I need to see her to adjust her medicine. She previously said she had to be seen after 3:30 and I have no availablity for that. She can be seen at any time.

## 2018-12-16 ENCOUNTER — Other Ambulatory Visit: Payer: Self-pay

## 2018-12-16 ENCOUNTER — Ambulatory Visit: Payer: 59 | Admitting: Family Medicine

## 2018-12-16 ENCOUNTER — Encounter: Payer: Self-pay | Admitting: Family Medicine

## 2018-12-16 VITALS — BP 129/86 | HR 74 | Temp 98.7°F | Wt 195.0 lb

## 2018-12-16 DIAGNOSIS — Z780 Asymptomatic menopausal state: Secondary | ICD-10-CM | POA: Diagnosis not present

## 2018-12-16 DIAGNOSIS — I10 Essential (primary) hypertension: Secondary | ICD-10-CM | POA: Diagnosis not present

## 2018-12-16 LAB — MICROALBUMIN, URINE WAIVED
Creatinine, Urine Waived: 200 mg/dL (ref 10–300)
Microalb, Ur Waived: 30 mg/L — ABNORMAL HIGH (ref 0–19)
Microalb/Creat Ratio: <30 mg/g (ref <30)

## 2018-12-16 MED ORDER — COMBIPATCH 0.05-0.14 MG/DAY TD PTTW: 1.0000 | MEDICATED_PATCH | TRANSDERMAL | 12 refills | Status: DC

## 2018-12-16 NOTE — Progress Notes (Signed)
BP 129/86   Pulse 74   Temp 98.7 F (37.1 C) (Oral)   Wt 195 lb (88.5 kg)   LMP  (LMP Unknown)   SpO2 95%   BMI 34.54 kg/m    Subjective:    Patient ID: Brittany Ochoa, female    DOB: 12/17/1965, 53 y.o.   MRN: [id number]  HPI: Brittany Ochoa is a 53 y.o. female  Chief Complaint  Patient presents with  . Menopause    f/u pt states she has stopped taking the prempro 5 days ago  . Labs Only    to check hormone levels and her cardiology recomend some blood work as well   MENOPAUSAL SYMPTOMS- started on prempro 2 months ago, was supposed to come in for 1 month follow up after starting it, but was not able to do that. She started taking the prempro 11/18/18 and was having improved hot flashes, but was feeling exhausted and not like herself. She stared bleeding about a week ago and stopped taking the prempro. She was not feeling well mentally- she was yelling at people. Emotionally not doing well.  Duration: uncontrolled Symptom severity: moderate Hot flashes: yes Night sweats: yes Sleep disturbances: yes Vaginal dryness: no Dyspareunia:no Decreased libido: no Emotional lability: yes Stress incontinence: no Previous HRT/pharmacotherapy: yes Hysterectomy: no Average interval between menses:  Absolute Contraindications to Hormonal Therapy:     Undiagnosed vaginal bleeding: no    Breast cancer: no    Endometrial cancer: no    Coronary disease: no    Cerebrovascular disease: no    Venous thromboembolic disease: no  HYPERTENSION Hypertension status: controlled  Satisfied with current treatment? yes Duration of hypertension: chronic BP monitoring frequency:  a few times a week BP medication side effects:  no Medication compliance: excellent compliance Previous BP meds: losartan Aspirin: no Recurrent headaches: no Visual changes: no Palpitations: no Dyspnea: no Chest pain: no Lower extremity edema: no Dizzy/lightheaded: no  Relevant past medical, surgical,  family and social history reviewed and updated as indicated. Interim medical history since our last visit reviewed. Allergies and medications reviewed and updated.  Review of Systems  Constitutional: Positive for diaphoresis. Negative for activity change, appetite change, chills, fatigue, fever and unexpected weight change.  HENT: Negative.   Respiratory: Negative.   Cardiovascular: Negative.   Neurological: Negative.   Psychiatric/Behavioral: Negative.     Per HPI unless specifically indicated above     Objective:    BP 129/86   Pulse 74   Temp 98.7 F (37.1 C) (Oral)   Wt 195 lb (88.5 kg)   LMP  (LMP Unknown)   SpO2 95%   BMI 34.54 kg/m   Wt Readings from Last 3 Encounters:  12/16/18 195 lb (88.5 kg)  12/12/18 196 lb 8 oz (89.1 kg)  12/11/18 194 lb 9.6 oz (88.3 kg)    Physical Exam Vitals signs and nursing note reviewed.  Constitutional:      General: She is not in acute distress.    Appearance: Normal appearance. She is not ill-appearing, toxic-appearing or diaphoretic.  HENT:     Head: Normocephalic and atraumatic.     Right Ear: External ear normal.     Left Ear: External ear normal.     Nose: Nose normal.     Mouth/Throat:     Mouth: Mucous membranes are moist.     Pharynx: Oropharynx is clear.  Eyes:     General: No scleral icterus.       Right  eye: No discharge.        Left eye: No discharge.     Extraocular Movements: Extraocular movements intact.     Conjunctiva/sclera: Conjunctivae normal.     Pupils: Pupils are equal, round, and reactive to light.  Neck:     Musculoskeletal: Normal range of motion and neck supple.  Cardiovascular:     Rate and Rhythm: Normal rate and regular rhythm.     Pulses: Normal pulses.     Heart sounds: Normal heart sounds. No murmur. No friction rub. No gallop.   Pulmonary:     Effort: Pulmonary effort is normal. No respiratory distress.     Breath sounds: Normal breath sounds. No stridor. No wheezing, rhonchi or rales.   Chest:     Chest wall: No tenderness.  Musculoskeletal: Normal range of motion.  Skin:    General: Skin is warm and dry.     Capillary Refill: Capillary refill takes less than 2 seconds.     Coloration: Skin is not jaundiced or pale.     Findings: No bruising, erythema, lesion or rash.  Neurological:     General: No focal deficit present.     Mental Status: She is alert and oriented to person, place, and time. Mental status is at baseline.  Psychiatric:        Mood and Affect: Mood normal.        Behavior: Behavior normal.        Thought Content: Thought content normal.        Judgment: Judgment normal.     Results for orders placed or performed during the hospital encounter of 11/29/18  SARS CORONAVIRUS 2 Nasal Swab Aptima Multi Swab   Specimen: Aptima Multi Swab; Nasal Swab  Result Value Ref Range   SARS Coronavirus 2 NEGATIVE NEGATIVE      Assessment & Plan:   Problem List Items Addressed This Visit      Cardiovascular and Mediastinum   Essential hypertension    Under good control on current regimen. Continue current regimen. Continue to monitor. Call with any concerns. Refills given. Labs drawn today.        Relevant Orders   Basic metabolic panel   Microalbumin, Urine Waived     Other   Menopause - Primary    Did not tolerate prempro- will try patch. Checking labs today. Await results. Call with any concerns. Recheck 2 weeks following starting combipatch.      Relevant Orders   TSH   LH   FSH   Estradiol   Testosterone, free, total([facility])       Follow up plan: Return in about 3 weeks (around 01/06/2019) for follow up hot flashes (virtual/phone OK).

## 2018-12-16 NOTE — Assessment & Plan Note (Signed)
Under good control on current regimen. Continue current regimen. Continue to monitor. Call with any concerns. Refills given. Labs drawn today.   

## 2018-12-16 NOTE — Assessment & Plan Note (Signed)
Did not tolerate prempro- will try patch. Checking labs today. Await results. Call with any concerns. Recheck 2 weeks following starting combipatch.

## 2018-12-18 ENCOUNTER — Encounter: Payer: Self-pay | Admitting: Pulmonary Disease

## 2018-12-18 LAB — BASIC METABOLIC PANEL
BUN/Creatinine Ratio: 16 (ref 9–23)
BUN: 10 mg/dL (ref 6–24)
CO2: 21 mmol/L (ref 20–29)
Calcium: 9.8 mg/dL (ref 8.7–10.2)
Chloride: 102 mmol/L (ref 96–106)
Creatinine, Ser: 0.64 mg/dL (ref 0.57–1.00)
GFR calc Af Amer: 119 mL/min/{1.73_m2} (ref >59)
GFR calc non Af Amer: 104 mL/min/{1.73_m2} (ref >59)
Glucose: 102 mg/dL — ABNORMAL HIGH (ref 65–99)
Potassium: 4.3 mmol/L (ref 3.5–5.2)
Sodium: 143 mmol/L (ref 134–144)

## 2018-12-18 LAB — TESTOSTERONE, FREE, TOTAL, SHBG
Sex Hormone Binding: 64.6 nmol/L (ref 17.3–125.0)
Testosterone, Free: 1.7 pg/mL (ref 0.0–4.2)
Testosterone: 12 ng/dL (ref 3–41)

## 2018-12-18 LAB — ESTRADIOL: Estradiol: 13.1 pg/mL

## 2018-12-18 LAB — TSH: TSH: 0.681 u[IU]/mL (ref 0.450–4.500)

## 2018-12-18 LAB — LUTEINIZING HORMONE: LH: 30.4 m[IU]/mL

## 2018-12-18 LAB — FOLLICLE STIMULATING HORMONE: FSH: 66.1 m[IU]/mL

## 2018-12-18 NOTE — Progress Notes (Signed)
Subjective:    Patient ID: Brittany Ochoa, female    DOB: 1966/01/04, 53 y.o.   MRN: [id number]  HPI 53 year old former smoker who presents today for follow-up on episodic dyspnea.  She was first evaluated here on 03 Sep 2018.  Please refer to that note for the details.  The patient was noted at the time to have gastroesophageal reflux with biopsy-proven reflux esophagitis.  She was given a prescription for Protonix and initially she was reluctant to take but then subsequently started taking.  Her previous issues with hoarseness and globus sensation have improved with this.  Once she noticed improvement she discontinued the use of Protonix.  States that she is concerned about the side effects.  She has noted that her dyspnea now is episodic and can occur at any time not necessarily with exertion or with any precipitating factor.  She states she feels this may have worsened since she has had to wear a mask but it can also happen when she is not wearing a mask.  This can happen when she is sitting quietly, she gets a sense of palpitations and gets dyspnea.  No chest pain associated with this.  She is currently having cardiac evaluation is to have a Holter monitor.  She had pulmonary function testing on 18 August which was normal in all aspects of flow volume loop, spirometric values, lung volumes and diffusion capacity.  She has a known thoracic aortic aneurysm that is followed by Dr. Gilford Raid with cardiothoracic in [place].  To that end, she gets regular echocardiograms that have shown no left ventricular dysfunction and she also gets CT scans of the chest that have shown no lung parenchymal abnormality and no PE.   Review of Systems  Constitutional: Negative.   HENT: Negative.   Eyes: Negative.   Respiratory: Positive for shortness of breath (Episodic, no specific trigger).   Cardiovascular: Negative.   Gastrointestinal:       Reflux symptoms have improved.  Endocrine: Negative.    Genitourinary: Negative.   Musculoskeletal: Negative.   Skin: Negative.   Allergic/Immunologic: Negative.   Neurological: Negative.   Hematological: Negative.   Psychiatric/Behavioral: Negative.   All other systems reviewed and are negative.      Objective:   Physical Exam Vitals signs and nursing note reviewed.  Constitutional:      Appearance: Normal appearance. She is overweight. She is not ill-appearing.  HENT:     Head: Normocephalic and atraumatic.     Right Ear: External ear normal.     Left Ear: External ear normal.     Nose:     Comments: Nose/mouth/throat not examined due to masking requirements for COVID 19. Eyes:     General: No scleral icterus.    Conjunctiva/sclera: Conjunctivae normal.     Pupils: Pupils are equal, round, and reactive to light.  Neck:     Musculoskeletal: Neck supple.  Cardiovascular:     Rate and Rhythm: Normal rate and regular rhythm.     Pulses: Normal pulses.     Heart sounds: Normal heart sounds.  Pulmonary:     Effort: Pulmonary effort is normal. No respiratory distress.     Breath sounds: Normal breath sounds.  Abdominal:     General: There is no distension.  Musculoskeletal: Normal range of motion.     Right lower leg: No edema.     Left lower leg: No edema.  Skin:    General: Skin is warm and dry.  Neurological:  General: No focal deficit present.     Mental Status: She is alert and oriented to person, place, and time.  Psychiatric:        Mood and Affect: Mood normal.        Behavior: Behavior normal.     We discussed all of the studies with the patient (echo and PFTs)      Assessment & Plan:   1.  Dyspnea:  This has improved in the sense that she does not have episodes of "choking" no episodes of hoarseness.  She describes episodic issues that are ill characterized fleeting in quality and I cannot ascribe this sensation to pulmonary issues.  She appears to have issues that are episodic fleeting at times associated  with wearing a mask and at times not.  Such episodes could be related to increased stress/anxiety however the patient dismisses this.  He has had negative pulmonary work-up.  Inhalers have not helped her at all with this in the past.  She did improve with management of gastroesophageal reflux but discontinued PPI because of concerns with side effects.  2.  Gastroesophageal reflux with biopsy-proven reflux esophagitis:  I have recommended that the patient continue PPI for at least a 75-month therapy however, she is reluctant to take medications and has discontinued PPI after a few weeks with improvement of her symptoms.  3.  Thoracic aortic aneurysm: This issue adds complexity to her management.  4.  Palpitations: She is being evaluated by cardiology for this issue.    I have recommended stress management, recommend follow-up with cardiology and primary care schedule.  We will see her here on an as-needed basis.  This chart was dictated using voice recognition software/Dragon.  Despite best efforts to proofread, errors can occur which can change the meaning.  Any change was purely unintentional.

## 2018-12-26 DIAGNOSIS — R002 Palpitations: Secondary | ICD-10-CM | POA: Diagnosis not present

## 2018-12-30 ENCOUNTER — Ambulatory Visit: Payer: 59 | Admitting: Family Medicine

## 2019-01-08 ENCOUNTER — Telehealth: Payer: Self-pay

## 2019-01-08 NOTE — Telephone Encounter (Signed)
Called to give the patient cardiac monitor results. [email or web address]. 

## 2019-01-08 NOTE — Telephone Encounter (Signed)
-----   Message from Theora Gianotti, NP sent at 01/07/2019  3:40 PM EDT ----- As suspected, isolated extra beats from the top and bottom chambers.  Some of the triggered events did correlate w/ pac's, but most did not correlate w/ any arrhythmia.  Overall reassuring study.  We could consider a low dose beta blocker if palpitations remain bothersome, but would hold off until she is seen back by Dr. Fletcher Anon in a few wks.

## 2019-01-08 NOTE — Telephone Encounter (Signed)
Patient made aware of monitor results with verbalized understanding.

## 2019-01-14 ENCOUNTER — Other Ambulatory Visit: Payer: Self-pay

## 2019-01-14 ENCOUNTER — Encounter: Payer: Self-pay | Admitting: Family Medicine

## 2019-01-14 ENCOUNTER — Ambulatory Visit (INDEPENDENT_AMBULATORY_CARE_PROVIDER_SITE_OTHER): Payer: 59 | Admitting: Family Medicine

## 2019-01-14 VITALS — Ht 63.0 in | Wt 200.0 lb

## 2019-01-14 DIAGNOSIS — R635 Abnormal weight gain: Secondary | ICD-10-CM | POA: Insufficient documentation

## 2019-01-14 DIAGNOSIS — Z780 Asymptomatic menopausal state: Secondary | ICD-10-CM | POA: Diagnosis not present

## 2019-01-14 MED ORDER — NALTREXONE-BUPROPION HCL ER 8-90 MG PO TB12: ORAL_TABLET | ORAL | 0 refills | Status: DC

## 2019-01-14 NOTE — Assessment & Plan Note (Signed)
Long discussion about options today, and patient agreeable to trying contrave. Discussed if she cannot tolerate it or decides she does just want to pursue phentermine again, can discuss with GYN or [facility]. F/u in 1 month for weight check if deciding to start the contrave. Continue good diet and exercise habits

## 2019-01-14 NOTE — Progress Notes (Signed)
Ht 5\' 3"  (1.6 m)   Wt 200 lb (90.7 kg)   LMP  (LMP Unknown)   BMI 35.43 kg/m    Subjective:    Patient ID: Brittany Ochoa, female    DOB: [birth year], 53 y.o.   MRN: [id number]  HPI: Brittany Ochoa is a 53 y.o. female  Chief Complaint  Patient presents with  . Weight Gain    Patient states that she's gaining weight from menopause. Patient states she has gained 20lbs in 1 month.  Patient states she's suicidal over her weight now.    . This visit was completed via WebEx due to the restrictions of the COVID-19 pandemic. All issues as above were discussed and addressed. Physical exam was done as above through visual confirmation on WebEx. If it was felt that the patient should be evaluated in the office, they were directed there. The patient verbally consented to this visit. . Location of the patient: in parked car . Location of the provider: home . Those involved with this call:  . Provider: Merrie Roof, PA-C . CMA: Merilyn Baba, [place] . Front Desk/Registration: Jill Side  . Time spent on call: 25 minutes with patient face to face via video conference. More than 50% of this time was spent in counseling and coordination of care. 5 minutes total spent in review of patient's record and preparation of their chart. I verified patient identity using two factors (patient name and date of birth). Patient consents verbally to being seen via telemedicine visit today.   Continues to worry about her weight. States she cannot and will not gain anymore weight. Feels it's from menopause messing with her metabolism since she eats very healthy and works out. Won't go out with friends anymore, doesn't have clothes that fit, very depressed by the situation. Had used phentermine in the past with excellent relief - states she lost around 50 lb after her divorce while using it. Walks her treadmill 5 days per week, walks dog daily, does not eat carbs or sugar.   Was on HRT pills which made her mean  and gain weight, did not take the combipatch and refuses to do so for these reasons.   Relevant past medical, surgical, family and social history reviewed and updated as indicated. Interim medical history since our last visit reviewed. Allergies and medications reviewed and updated.  Review of Systems  Per HPI unless specifically indicated above     Objective:    Ht 5\' 3"  (1.6 m)   Wt 200 lb (90.7 kg)   LMP  (LMP Unknown)   BMI 35.43 kg/m   Wt Readings from Last 3 Encounters:  01/14/19 200 lb (90.7 kg)  12/16/18 195 lb (88.5 kg)  12/12/18 196 lb 8 oz (89.1 kg)    Physical Exam Vitals signs and nursing note reviewed.  Constitutional:      General: She is not in acute distress.    Appearance: Normal appearance.  HENT:     Head: Atraumatic.     Right Ear: External ear normal.     Left Ear: External ear normal.     Nose: Nose normal. No congestion.     Mouth/Throat:     Mouth: Mucous membranes are moist.     Pharynx: Oropharynx is clear. No posterior oropharyngeal erythema.  Eyes:     Extraocular Movements: Extraocular movements intact.     Conjunctiva/sclera: Conjunctivae normal.  Neck:     Musculoskeletal: Normal range of motion.  Cardiovascular:  Comments: Unable to assess via virtual visit Pulmonary:     Effort: Pulmonary effort is normal. No respiratory distress.  Musculoskeletal: Normal range of motion.  Skin:    General: Skin is dry.     Findings: No erythema.  Neurological:     Mental Status: She is alert and oriented to person, place, and time.  Psychiatric:        Mood and Affect: Mood normal.        Thought Content: Thought content normal.        Judgment: Judgment normal.     Results for orders placed or performed in visit on 99991111  Basic metabolic panel  Result Value Ref Range   Glucose 102 (H) 65 - 99 mg/dL   BUN 10 6 - 24 mg/dL   Creatinine, Ser 0.64 0.57 - 1.00 mg/dL   GFR calc non Af Amer 104 >59 mL/min/1.73   GFR calc Af Amer 119 >59  mL/min/1.73   BUN/Creatinine Ratio 16 9 - 23   Sodium 143 134 - 144 mmol/L   Potassium 4.3 3.5 - 5.2 mmol/L   Chloride 102 96 - 106 mmol/L   CO2 21 20 - 29 mmol/L   Calcium 9.8 8.7 - 10.2 mg/dL  TSH  Result Value Ref Range   TSH 0.681 0.450 - 4.500 uIU/mL  LH  Result Value Ref Range   LH 30.4 mIU/mL  FSH  Result Value Ref Range   FSH 66.1 mIU/mL  Estradiol  Result Value Ref Range   Estradiol 13.1 pg/mL  Testosterone, free, total([facility])  Result Value Ref Range   Testosterone 12 3 - 41 ng/dL   Testosterone, Free 1.7 0.0 - 4.2 pg/mL   Sex Hormone Binding 64.6 17.3 - 125.0 nmol/L  Microalbumin, Urine Waived  Result Value Ref Range   Microalb, Ur Waived 30 (H) 0 - 19 mg/L   Creatinine, Urine Waived 200 10 - 300 mg/dL   Microalb/Creat Ratio <30 <30 mg/g      Assessment & Plan:   Problem List Items Addressed This Visit      Other   Menopause    Did not tolerate HRT well, not interested in other forms of symptom management at this time      Weight gain - Primary    Long discussion about options today, and patient agreeable to trying contrave. Discussed if she cannot tolerate it or decides she does just want to pursue phentermine again, can discuss with GYN or [facility]. F/u in 1 month for weight check if deciding to start the contrave. Continue good diet and exercise habits         Follow up plan: Return in about 4 weeks (around 02/11/2019) for weight f/u with PCP.

## 2019-01-14 NOTE — Assessment & Plan Note (Signed)
Did not tolerate HRT well, not interested in other forms of symptom management at this time

## 2019-01-23 ENCOUNTER — Ambulatory Visit: Payer: 59 | Admitting: Cardiovascular Disease

## 2019-01-24 ENCOUNTER — Other Ambulatory Visit: Payer: Self-pay | Admitting: Family Medicine

## 2019-01-24 DIAGNOSIS — R635 Abnormal weight gain: Secondary | ICD-10-CM

## 2019-02-14 ENCOUNTER — Telehealth: Payer: Self-pay | Admitting: Family Medicine

## 2019-02-14 NOTE — Telephone Encounter (Signed)
Pt.stated she did not want a f/u due to her insurance not covering medication and the office where she was referred to didnt have any anointments available until [place] stated she is going else where for weight issues now.

## 2019-02-17 NOTE — Telephone Encounter (Signed)
Noted. Thanks.

## 2019-04-14 ENCOUNTER — Telehealth: Payer: 59 | Admitting: Family

## 2019-04-14 ENCOUNTER — Inpatient Hospital Stay
Admission: RE | Admit: 2019-04-14 | Discharge: 2019-04-14 | Disposition: A | Discharge status: Home / Self Care | Payer: 59 | Source: Ambulatory Visit

## 2019-04-14 DIAGNOSIS — J019 Acute sinusitis, unspecified: Secondary | ICD-10-CM

## 2019-04-14 DIAGNOSIS — B9689 Other specified bacterial agents as the cause of diseases classified elsewhere: Secondary | ICD-10-CM

## 2019-04-14 MED ORDER — AMOXICILLIN-POT CLAVULANATE 875-125 MG PO TABS: 1.0000 | ORAL_TABLET | Freq: Two times a day (BID) | ORAL | 0 refills | Status: DC

## 2019-04-14 NOTE — Progress Notes (Signed)

## 2019-04-17 ENCOUNTER — Telehealth: Payer: 59 | Admitting: Emergency Medicine

## 2019-04-17 DIAGNOSIS — R0981 Nasal congestion: Secondary | ICD-10-CM

## 2019-04-17 DIAGNOSIS — U071 COVID-19: Secondary | ICD-10-CM

## 2019-04-17 NOTE — Progress Notes (Signed)
Your current symptoms could be consistent with the coronavirus.   I've reviewed the antibiotics that you were given.  They are appropriate to treat sinus infection.  The Augmentin also provides good coverage for bacteria that can cause pneumonia.  Neither Augmentin nor Azithromycin (Z-pak) have been shown to have significant results in treating COVID-19.  The reason that Z-paks have been prescribed in the past for COVID-19 is to treat pneumonia that sometimes happens with COVID-19.  If you have this, the Augmentin will treat that just fine as well.  I do not recommend a change in antibiotics.  You can continue taking the Augmentin, but it probably is NOT helping because you are infected with a virus and not a bacteria.  Unfortunately, this will likely need to run it's course.   I do recommend that if you worsen with regard to fever, cough, or shortness of breath that you make an in-person visit at either our urgent care or ER.   Please continue to quarantine yourself while awaiting your test results. If you develop fever/cough/breathlessness, please stay home for 10 days with improving symptoms and until you have had 24 hours of no fever (without taking a fever reducer).  You should wear a mask or cloth face covering over your nose and mouth if you must be around other people or animals, including pets (even at home). Try to stay at least 6 feet away from other people. This will protect the people around you.  Please continue good preventive care measures, including:  frequent hand-washing, avoid touching your face, cover coughs/sneezes, stay out of crowds and keep a 6 foot distance from others.  COVID-19 is a respiratory illness with symptoms that are similar to the flu. Symptoms are typically mild to moderate, but there have been cases of severe illness and death due to the virus.   The following symptoms may appear 2-14 days after exposure: . Fever . Cough . Shortness of breath or difficulty  breathing . Chills . Repeated shaking with chills . Muscle pain . Headache . Sore throat . New loss of taste or smell . Fatigue . Congestion or runny nose . Nausea or vomiting . Diarrhea  Go to the nearest hospital ED for assessment if fever/cough/breathlessness are severe or illness seems like a threat to life.  It is vitally important that if you feel that you have an infection such as this virus or any other virus that you stay home and away from places where you may spread it to others.  You should avoid contact with people age 34 and older.    You may also take acetaminophen (Tylenol) as needed for fever.  Reduce your risk of any infection by using the same precautions used for avoiding the common cold or flu:  Marland Kitchen Wash your hands often with soap and warm water for at least 20 seconds.  If soap and water are not readily available, use an alcohol-based hand sanitizer with at least 60% alcohol.  . If coughing or sneezing, cover your mouth and nose by coughing or sneezing into the elbow areas of your shirt or coat, into a tissue or into your sleeve (not your hands). . Avoid shaking hands with others and consider head nods or verbal greetings only. . Avoid touching your eyes, nose, or mouth with unwashed hands.  . Avoid close contact with people who are sick. . Avoid places or events with large numbers of people in one location, like concerts or sporting events. . Carefully  consider travel plans you have or are making. . If you are planning any travel outside or inside the [place], visit the CDC's Travelers' Health webpage for the latest health notices. . If you have some symptoms but not all symptoms, continue to monitor at home and seek medical attention if your symptoms worsen. . If you are having a medical emergency, call 911.  HOME CARE . Only take medications as instructed by your medical team. . Drink plenty of fluids and get plenty of rest. . A steam or ultrasonic humidifier can  help if you have congestion.   GET HELP RIGHT AWAY IF YOU HAVE EMERGENCY WARNING SIGNS** FOR COVID-19. If you or someone is showing any of these signs seek emergency medical care immediately. Call 911 or proceed to your closest emergency facility if: . You develop worsening high fever. . Trouble breathing . Bluish lips or face . Persistent pain or pressure in the chest . New confusion . Inability to wake or stay awake . You cough up blood. . Your symptoms become more severe  **This list is not all possible symptoms. Contact your medical provider for any symptoms that are sever or concerning to you.  MAKE SURE YOU   Understand these instructions.  Will watch your condition.  Will get help right away if you are not doing well or get worse.  Your e-visit answers were reviewed by a board certified advanced clinical practitioner to complete your personal care plan.  Depending on the condition, your plan could have included both over the counter or prescription medications.  If there is a problem please reply once you have received a response from your provider.  Your safety is important to [place].  If you have drug allergies check your prescription carefully.    You can use MyChart to ask questions about today's visit, request a non-urgent call back, or ask for a work or school excuse for 24 hours related to this e-Visit. If it has been greater than 24 hours you will need to follow up with your provider, or enter a new e-Visit to address those concerns. You will get an e-mail in the next two days asking about your experience.  I hope that your e-visit has been valuable and will speed your recovery. Thank you for using e-visits.   Greater than 5 minutes, yet less than 10 minutes was used in reviewing the patient's chart, questionnaire, prescribing medications, and documentation for this visit.

## 2019-04-19 ENCOUNTER — Encounter: Payer: Self-pay | Admitting: Emergency Medicine

## 2019-04-19 ENCOUNTER — Other Ambulatory Visit: Payer: Self-pay

## 2019-04-19 DIAGNOSIS — R42 Dizziness and giddiness: Secondary | ICD-10-CM | POA: Insufficient documentation

## 2019-04-19 DIAGNOSIS — I10 Essential (primary) hypertension: Secondary | ICD-10-CM | POA: Diagnosis not present

## 2019-04-19 DIAGNOSIS — Z79899 Other long term (current) drug therapy: Secondary | ICD-10-CM | POA: Diagnosis not present

## 2019-04-19 DIAGNOSIS — Z87891 Personal history of nicotine dependence: Secondary | ICD-10-CM | POA: Diagnosis not present

## 2019-04-19 DIAGNOSIS — U071 COVID-19: Secondary | ICD-10-CM | POA: Diagnosis not present

## 2019-04-19 DIAGNOSIS — R079 Chest pain, unspecified: Secondary | ICD-10-CM | POA: Diagnosis not present

## 2019-04-19 DIAGNOSIS — R0602 Shortness of breath: Secondary | ICD-10-CM | POA: Diagnosis not present

## 2019-04-19 LAB — URINALYSIS, COMPLETE (UACMP) WITH MICROSCOPIC
Bacteria, UA: NONE SEEN
Bilirubin Urine: NEGATIVE
Glucose, UA: NEGATIVE mg/dL
Hgb urine dipstick: NEGATIVE
Ketones, ur: NEGATIVE mg/dL
Leukocytes,Ua: NEGATIVE
Nitrite: NEGATIVE
Protein, ur: NEGATIVE mg/dL
Specific Gravity, Urine: 1.006 (ref 1.005–1.030)
Squamous Epithelial / HPF: NONE SEEN (ref 0–5)
pH: 7 (ref 5.0–8.0)

## 2019-04-19 LAB — CBC
HCT: 43.3 % (ref 36.0–46.0)
Hemoglobin: 14.4 g/dL (ref 12.0–15.0)
MCH: 29.5 pg (ref 26.0–34.0)
MCHC: 33.3 g/dL (ref 30.0–36.0)
MCV: 88.7 fL (ref 80.0–100.0)
Platelets: 209 10*3/uL (ref 150–400)
RBC: 4.88 MIL/uL (ref 3.87–5.11)
RDW: 12.6 % (ref 11.5–15.5)
WBC: 5 10*3/uL (ref 4.0–10.5)
nRBC: 0 % (ref 0.0–0.2)

## 2019-04-19 LAB — BASIC METABOLIC PANEL
Anion gap: 11 (ref 5–15)
BUN: 14 mg/dL (ref 6–20)
CO2: 26 mmol/L (ref 22–32)
Calcium: 9.2 mg/dL (ref 8.9–10.3)
Chloride: 104 mmol/L (ref 98–111)
Creatinine, Ser: 0.61 mg/dL (ref 0.44–1.00)
GFR calc Af Amer: >60 mL/min (ref >60)
GFR calc non Af Amer: >60 mL/min (ref >60)
Glucose, Bld: 127 mg/dL — ABNORMAL HIGH (ref 70–99)
Potassium: 3.5 mmol/L (ref 3.5–5.1)
Sodium: 141 mmol/L (ref 135–145)

## 2019-04-19 NOTE — ED Triage Notes (Addendum)
Pt arrived via [place] with reports of COVID positive 12/29, pt states "I feel like I'm going to die" Pt c/o being short of breath when talking. Pt states while sitting she felt like she was going to pass out.  No obvious distress noted at this time. Pt tearful in triage.  Pt reports hx of HTN. Pt reports taking 17mg  of Phentermine this morning as well.

## 2019-04-20 ENCOUNTER — Emergency Department: Payer: 59

## 2019-04-20 ENCOUNTER — Emergency Department
Admission: EM | Admit: 2019-04-20 | Discharge: 2019-04-20 | Disposition: A | Discharge status: Home / Self Care | Payer: 59 | Attending: Emergency Medicine | Admitting: Emergency Medicine

## 2019-04-20 DIAGNOSIS — U071 COVID-19: Secondary | ICD-10-CM | POA: Diagnosis not present

## 2019-04-20 DIAGNOSIS — Z79899 Other long term (current) drug therapy: Secondary | ICD-10-CM | POA: Diagnosis not present

## 2019-04-20 DIAGNOSIS — R0602 Shortness of breath: Secondary | ICD-10-CM | POA: Diagnosis not present

## 2019-04-20 DIAGNOSIS — Z87891 Personal history of nicotine dependence: Secondary | ICD-10-CM | POA: Diagnosis not present

## 2019-04-20 DIAGNOSIS — R42 Dizziness and giddiness: Secondary | ICD-10-CM | POA: Diagnosis not present

## 2019-04-20 DIAGNOSIS — R079 Chest pain, unspecified: Secondary | ICD-10-CM | POA: Diagnosis not present

## 2019-04-20 DIAGNOSIS — I10 Essential (primary) hypertension: Secondary | ICD-10-CM | POA: Diagnosis not present

## 2019-04-20 LAB — TROPONIN I (HIGH SENSITIVITY): Troponin I (High Sensitivity): 3 ng/L (ref <18)

## 2019-04-20 NOTE — Discharge Instructions (Addendum)
QUARANTINE INSTRUCTION  Follow these instructions at home:  Protecting others To avoid spreading the illness to other people: Quarantine in your home until you have had no cough and fever for 7 days. Household members should also be quarantine for at least 14 days after being exposed to you. Wash your hands often with soap and water. If soap and water are not available, use an alcohol-based hand sanitizer. If you have not cleaned your hands, do not touch your face. Make sure that all people in your household wash their hands well and often. Cover your nose and mouth when you cough or sneeze. Throw away used tissues. Stay home if you have any cold-like or flu-like symptoms. General instructions Go to your local pharmacy and buy a pulse oximeter (this is a machine that measures your oxygen). Check your oxygen levels at least 3 times a day. If your oxygen level is 92% or less return to the emergency room immediately Take over-the-counter and prescription medicines only as told by your health care provider. If you need medication for fever take tylenol or ibuprofen Drink enough fluid to keep your urine pale yellow. Rest at home as directed by your health care provider. Do not give aspirin to a child with the flu, because of the association with Reye's syndrome. Do not use tobacco products, including cigarettes, chewing tobacco, and e-cigarettes. If you need help quitting, ask your health care provider. Keep all follow-up visits as told by your health care provider. This is important. How is this prevented? Avoid areas where an outbreak has been reported. Avoid large groups of people. Keep a safe distance from people who are coughing and sneezing. Do not touch your face if you have not cleaned your hands. When you are around people who are sick or might be sick, wear a mask to protect yourself. Contact a health care provider if: You have symptoms of SARS (cough, fever, chest pain, shortness of  breath) that are not getting better at home. You have a fever. If you have difficulty breathing go to your local ER or call 911

## 2019-04-20 NOTE — ED Provider Notes (Signed)
[facility] Emergency Department Provider Note  ____________________________________________  Time seen: Approximately 4:19 AM  I have reviewed the triage vital signs and the nursing notes.   HISTORY  Chief Complaint Dizziness and COVID POSITIVE   HPI Brittany Ochoa is a 54 y.o. female with history as listed below who tested positive for Covid 5 days ago who presents for evaluation of dizziness.  Patient reports that she has not had any Covid symptoms.  She denies fever, body aches, cough, chest pain, shortness of breath, vomiting, diarrhea.  She reports that earlier today she was sitting down and when she stood up she felt dizzy like she was going to pass out.  The whole episode lasted 15 to 20 minutes.  She checked her blood pressure which was elevated.  She reports that her read at home was 140/170.  She reports that she felt very anxious and when she rechecked it was even higher which prompted her visit to the emergency room.  She does not take any medications for blood pressure.  She reports that she has been fine since arriving to the emergency room 6 hours ago.   Past Medical History:  Diagnosis Date  . AAA (abdominal aortic aneurysm) ([place])    a. 06/2018 CTA Chest: 4.1 x 4.0 cm fusiform Asc Ao aneurysm-unchanged.  Descending thoracic aorta 2.1 cm.  Marland Kitchen Dyspnea on exertion    a. 06/2017 echo: EF 60 to 65%.  No regional wall motion abnormalities.  Ascending aortic diameter 3.9 cm.  Mild MR.  . Multiple thyroid nodules   . Reflux esophagitis     Patient Active Problem List   Diagnosis Date Noted  . Weight gain 01/14/2019  . Essential hypertension 12/16/2018  . Menopause 10/04/2018  . Esophageal dysphagia   . Stomach irritation   . OCD (obsessive compulsive disorder) 01/10/2018  . Exercise-induced asthma 10/26/2017  . Multiple thyroid nodules 07/15/2017  . Mitral regurgitation 07/15/2017  . History of radiation exposure 07/15/2017  . Ascending  aortic aneurysm ([place]) 05/04/2017  . 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 Pineal gland cyst 09/28/2016    Past Surgical History:  Procedure Laterality Date  . BREAST REDUCTION SURGERY  1998  . ESOPHAGOGASTRODUODENOSCOPY (EGD) WITH PROPOFOL N/A 01/24/2018   Procedure: ESOPHAGOGASTRODUODENOSCOPY (EGD) WITH PROPOFOL;  Surgeon: [name], MD;  Location: [facility] ENDOSCOPY;  Service: Endoscopy;  Laterality: N/A;  . lipoma removal  2007   back    Prior to Admission medications   Medication Sig Start Date End Date Taking? Authorizing Provider  albuterol (PROVENTIL HFA;VENTOLIN HFA) 108 (90 Base) MCG/ACT inhaler Inhale 2 puffs into the lungs every 4 (four) hours as needed. Patient not taking: Reported on 12/16/2018 07/22/18   Darlin Priestly, PA-C  amoxicillin-clavulanate (AUGMENTIN) 875-125 MG tablet Take 1 tablet by mouth 2 (two) times daily. 04/14/19   Kennyth Arnold, FNP  losartan (COZAAR) 25 MG tablet Take 1 tablet (25 mg total) by mouth daily. Patient not taking: Reported on 01/14/2019 11/13/18 02/11/19  Theora Gianotti, NP  Naltrexone-buPROPion HCl ER 8-90 MG TB12 Start 1 tab QAM for 7 days, then 1 tab BID for 7 days, then 2 tabs QAM and 1 QPM for 7 days, then 2 tablets BID ongoing 01/14/19   Volney American, PA-C    Allergies Codeine  Family History  Problem Relation Age of Onset  . Diabetes Mother   . Diabetes Father   . Heart disease Father   . Heart attack Father   .  Cancer Father   . Cancer Paternal Grandmother   . Heart disease Paternal Grandmother   . Cancer Paternal Grandfather   . Heart disease Paternal Grandfather   . Osteoporosis Maternal Grandmother     Social History Social History   Tobacco Use  . Smoking status: Former Smoker    Packs/day: 3.00    Years: 25.00    Pack years: 75.00    Quit date: 04/17/2004    Years since quitting: 15.0  . Smokeless tobacco: Never Used  Substance Use Topics  . Alcohol use: Yes    Alcohol/week: 0.0 standard  drinks    Comment: social  . Drug use: No    Comment: past only    Review of Systems  Constitutional: Negative for fever. + Lightheadedness Eyes: Negative for visual changes. ENT: Negative for sore throat. Neck: No neck pain  Cardiovascular: Negative for chest pain. Respiratory: Negative for shortness of breath. Gastrointestinal: Negative for abdominal pain, vomiting or diarrhea. Genitourinary: Negative for dysuria. Musculoskeletal: Negative for back pain. Skin: Negative for rash. Neurological: Negative for headaches, weakness or numbness. Psych: No SI or HI  ____________________________________________   PHYSICAL EXAM:  VITAL SIGNS: Vitals:   04/19/19 2250 04/20/19 0344  BP: (!) 176/108 113/87  Pulse: (!) 131 84  Resp: 18 17  Temp: 98.7 F (37.1 C)   SpO2: 97% 97%    Constitutional: Alert and oriented. Well appearing and in no apparent distress. HEENT:      Head: Normocephalic and atraumatic.         Eyes: Conjunctivae are normal. Sclera is non-icteric.       Mouth/Throat: Mucous membranes are moist.       Neck: Supple with no signs of meningismus. Cardiovascular: Regular rate and rhythm. No murmurs, gallops, or rubs. 2+ symmetrical distal pulses are present in all extremities. No JVD. Respiratory: Normal respiratory effort. Lungs are clear to auscultation bilaterally. No wheezes, crackles, or rhonchi.  Gastrointestinal: Soft, non tender, and non distended with positive bowel sounds. No rebound or guarding. Musculoskeletal: Nontender with normal range of motion in all extremities. No edema, cyanosis, or erythema of extremities. Neurologic: Normal speech and language. Face is symmetric. Moving all extremities. No gross focal neurologic deficits are appreciated. Skin: Skin is warm, dry and intact. No rash noted. Psychiatric: Mood and affect are normal. Speech and behavior are normal.  ____________________________________________   LABS (all labs ordered are  listed, but only abnormal results are displayed)  Labs Reviewed  BASIC METABOLIC PANEL - Abnormal; Notable for the following components:      Result Value   Glucose, Bld 127 (*)    All other components within normal limits  URINALYSIS, COMPLETE (UACMP) WITH MICROSCOPIC - Abnormal; Notable for the following components:   Color, Urine STRAW (*)    APPearance CLEAR (*)    All other components within normal limits  CBC  TROPONIN I (HIGH SENSITIVITY)  TROPONIN I (HIGH SENSITIVITY)   ____________________________________________  EKG  ED ECG REPORT I, Rudene Re, the attending physician, personally viewed and interpreted this ECG.  Sinus tachycardia, rate of 108, normal intervals, left axis deviation, no ST elevations or depressions.  Sinus tachycardia is new when compared to prior. ____________________________________________  RADIOLOGY  I have personally reviewed the images performed during this visit and I agree with the Radiologist's read.   Interpretation by Radiologist:  DG Chest 2 View  Result Date: 04/20/2019 CLINICAL DATA:  Chest pain. COVID positive. Shortness of breath. EXAM: CHEST - 2 VIEW COMPARISON:  Radiograph 11/07/2018 FINDINGS: Heart is normal in size. Normal mediastinal contours. Trace subsegmental opacities at the left lung base. No pulmonary edema. No pleural fluid or pneumothorax. Stable pectus deformity of the thorax. IMPRESSION: Trace subsegmental opacities at the left lung base, appearance typical of atelectasis, however may represent pneumonia in the setting of COVID. Electronically Signed   By: Keith Rake M.D.   On: 04/20/2019 01:09     ____________________________________________   PROCEDURES  Procedure(s) performed: None Procedures Critical Care performed:  None ____________________________________________   INITIAL IMPRESSION / ASSESSMENT AND PLAN / ED COURSE  54 y.o. female with history as listed below who tested positive for Covid  5 days ago who presents for evaluation of dizziness.  Patient with a brief episode lasting about 20 minutes of lightheadedness while standing up at home.  She is otherwise extremely well-appearing with normal vital signs, BP is 129/78, heart rate is in the 80s, satting 100% on room air at rest and with ambulation.  She is completely asymptomatic at this time.  Neurologically intact.  EKG showing no evidence of ischemia or dysrhythmias.  Upon arrival to the emergency room patient was tachycardic but she was also upset and scared.  Her tachycardia resolved without any intervention once patient calm down.  Labs show no evidence of anemia, dehydration, electrolyte derangements, or cardiac ischemia.  Discussed supportive care at home, quarantine, O2 sats monitoring.  Discussed follow-up with PCP and my standard return precautions.       As part of my medical decision making, I reviewed the following data within the [place] notes reviewed and incorporated, Labs reviewed , EKG interpreted , Old chart reviewed, Radiograph reviewed , Notes from prior ED visits and [place] Controlled Substance Database   Please note:  Patient was evaluated in Emergency Department today for the symptoms described in the history of present illness. Patient was evaluated in the context of the global COVID-19 pandemic, which necessitated consideration that the patient might be at risk for infection with the SARS-CoV-2 virus that causes COVID-19. Institutional protocols and algorithms that pertain to the evaluation of patients at risk for COVID-19 are in a state of rapid change based on information released by regulatory bodies including the CDC and federal and state organizations. These policies and algorithms were followed during the patient's care in the ED.  Some ED evaluations and interventions may be delayed as a result of limited staffing during the pandemic.   ____________________________________________    FINAL CLINICAL IMPRESSION(S) / ED DIAGNOSES   Final diagnoses:  Lightheadedness      NEW MEDICATIONS STARTED DURING THIS VISIT:  ED Discharge Orders    None       Note:  This document was prepared using Dragon voice recognition software and may include unintentional dictation errors.    Alfred Levins, [place], MD 04/20/19 [phone]

## 2019-04-20 NOTE — ED Notes (Signed)
This pt did not wait for this RN to discharge and left.

## 2019-04-21 ENCOUNTER — Encounter: Payer: Self-pay | Admitting: Family Medicine

## 2019-04-21 ENCOUNTER — Other Ambulatory Visit: Payer: Self-pay

## 2019-04-21 ENCOUNTER — Ambulatory Visit (INDEPENDENT_AMBULATORY_CARE_PROVIDER_SITE_OTHER): Payer: 59 | Admitting: Family Medicine

## 2019-04-21 DIAGNOSIS — U071 COVID-19: Secondary | ICD-10-CM | POA: Diagnosis not present

## 2019-04-21 MED ORDER — PREDNISONE 50 MG PO TABS: 50.0000 mg | ORAL_TABLET | Freq: Every day | ORAL | 0 refills | Status: DC

## 2019-04-21 NOTE — Progress Notes (Signed)
LMP  (LMP Unknown)    Subjective:    Patient ID: Brittany Ochoa, female    DOB: 02-19-1966, 54 y.o.   MRN: [id number]  HPI: Brittany Ochoa is a 54 y.o. female  Chief Complaint  Patient presents with  . COVID   UPPER RESPIRATORY TRACT INFECTION Duration- Tested positive on 12/29, had been feeling well until Sunday- slept all day on Sunday, feeling better today. Has a headache, issues with her sinuses Worst symptom: sinus pressure and headache Fever: no, chills Cough: no Shortness of breath: yes- with talking on the phone a lot Wheezing: no Chest pain: no Chest tightness: no Chest congestion: no Nasal congestion: yes Runny nose: no Post nasal drip: yes Sneezing: no Sore throat: no Swollen glands: no Sinus pressure: yes Headache: yes Face pain: no Toothache: no Ear pain: no  Ear pressure: yes  Eyes red/itching:no Eye drainage/crusting: no  Vomiting: no Rash: no Fatigue: yes Sick contacts: yes Strep contacts: no  Context: fluctuating Recurrent sinusitis: no Relief with OTC cold/cough medications: no  Treatments attempted: pseudoephedrine   Relevant past medical, surgical, family and social history reviewed and updated as indicated. Interim medical history since our last visit reviewed. Allergies and medications reviewed and updated.  Review of Systems  Constitutional: Positive for chills. Negative for activity change, appetite change, diaphoresis, fatigue, fever and unexpected weight change.  HENT: Positive for congestion, postnasal drip and sinus pressure. Negative for dental problem, drooling, ear discharge, ear pain, facial swelling, hearing loss, mouth sores, nosebleeds, rhinorrhea, sinus pain, sneezing, sore throat, tinnitus, trouble swallowing and voice change.   Eyes: Negative.   Respiratory: Negative.   Cardiovascular: Negative.   Gastrointestinal: Negative.   Neurological: Negative.   Psychiatric/Behavioral: Negative.     Per HPI unless  specifically indicated above     Objective:    LMP  (LMP Unknown)   Wt Readings from Last 3 Encounters:  04/19/19 185 lb (83.9 kg)  01/14/19 200 lb (90.7 kg)  12/16/18 195 lb (88.5 kg)    Physical Exam Vitals and nursing note reviewed.  Constitutional:      General: She is not in acute distress.    Appearance: Normal appearance. She is not ill-appearing, toxic-appearing or diaphoretic.  HENT:     Head: Normocephalic and atraumatic.     Right Ear: External ear normal.     Left Ear: External ear normal.     Nose: Nose normal.     Mouth/Throat:     Mouth: Mucous membranes are moist.     Pharynx: Oropharynx is clear.  Eyes:     General: No scleral icterus.       Right eye: No discharge.        Left eye: No discharge.     Conjunctiva/sclera: Conjunctivae normal.     Pupils: Pupils are equal, round, and reactive to light.  Pulmonary:     Effort: Pulmonary effort is normal. No respiratory distress.     Comments: Speaking in full sentences Musculoskeletal:        General: Normal range of motion.     Cervical back: Normal range of motion.  Skin:    Coloration: Skin is not jaundiced or pale.     Findings: No bruising, erythema, lesion or rash.  Neurological:     Mental Status: She is alert and oriented to person, place, and time. Mental status is at baseline.  Psychiatric:        Mood and Affect: Mood normal.  Behavior: Behavior normal.        Thought Content: Thought content normal.        Judgment: Judgment normal.     Results for orders placed or performed during the hospital encounter of [id number]  Basic metabolic panel  Result Value Ref Range   Sodium 141 135 - 145 mmol/L   Potassium 3.5 3.5 - 5.1 mmol/L   Chloride 104 98 - 111 mmol/L   CO2 26 22 - 32 mmol/L   Glucose, Bld 127 (H) 70 - 99 mg/dL   BUN 14 6 - 20 mg/dL   Creatinine, Ser 0.61 0.44 - 1.00 mg/dL   Calcium 9.2 8.9 - 10.3 mg/dL   GFR calc non Af Amer >60 >60 mL/min   GFR calc Af Amer >60 >60  mL/min   Anion gap 11 5 - 15  CBC  Result Value Ref Range   WBC 5.0 4.0 - 10.5 K/uL   RBC 4.88 3.87 - 5.11 MIL/uL   Hemoglobin 14.4 12.0 - 15.0 g/dL   HCT 43.3 36.0 - 46.0 %   MCV 88.7 80.0 - 100.0 fL   MCH 29.5 26.0 - 34.0 pg   MCHC 33.3 30.0 - 36.0 g/dL   RDW 12.6 11.5 - 15.5 %   Platelets 209 150 - 400 K/uL   nRBC 0.0 0.0 - 0.2 %  Urinalysis, Complete w Microscopic  Result Value Ref Range   Color, Urine STRAW (A) YELLOW   APPearance CLEAR (A) CLEAR   Specific Gravity, Urine 1.006 1.005 - 1.030   pH 7.0 5.0 - 8.0   Glucose, UA NEGATIVE NEGATIVE mg/dL   Hgb urine dipstick NEGATIVE NEGATIVE   Bilirubin Urine NEGATIVE NEGATIVE   Ketones, ur NEGATIVE NEGATIVE mg/dL   Protein, ur NEGATIVE NEGATIVE mg/dL   Nitrite NEGATIVE NEGATIVE   Leukocytes,Ua NEGATIVE NEGATIVE   RBC / HPF 0-5 0 - 5 RBC/hpf   WBC, UA 0-5 0 - 5 WBC/hpf   Bacteria, UA NONE SEEN NONE SEEN   Squamous Epithelial / LPF NONE SEEN 0 - 5  Troponin I (High Sensitivity)  Result Value Ref Range   Troponin I (High Sensitivity) 3 <18 ng/L      Assessment & Plan:   Problem List Items Addressed This Visit    None    Visit Diagnoses    COVID-19    -  Primary   Will treat symptoms with burst of prednisone. Continue to monitor. Call with any concerns. Self-quarantine and rest.    Relevant Orders   MyChart COVID-19 home monitoring program   Temperature monitoring       Follow up plan: No follow-ups on file.   . This visit was completed via FaceTime due to the restrictions of the COVID-19 pandemic. All issues as above were discussed and addressed. Physical exam was done as above through visual confirmation on FaceTime. If it was felt that the patient should be evaluated in the office, they were directed there. The patient verbally consented to this visit. . Location of the patient: home . Location of the provider: home . Those involved with this call:  . Provider: Park Liter, DO . CMA: Tiffany Reel,  CMA . Front Desk/Registration: Don Perking  . Time spent on call: 15 minutes with patient face to face via video conference. More than 50% of this time was spent in counseling and coordination of care. 23 minutes total spent in review of patient's record and preparation of their chart.

## 2019-04-30 ENCOUNTER — Encounter: Payer: Self-pay | Admitting: Family Medicine

## 2019-05-01 ENCOUNTER — Other Ambulatory Visit: Payer: Self-pay

## 2019-05-01 ENCOUNTER — Ambulatory Visit (INDEPENDENT_AMBULATORY_CARE_PROVIDER_SITE_OTHER): Payer: 59 | Admitting: Family Medicine

## 2019-05-01 ENCOUNTER — Encounter: Payer: Self-pay | Admitting: Family Medicine

## 2019-05-01 VITALS — Temp 97.4°F

## 2019-05-01 DIAGNOSIS — J0100 Acute maxillary sinusitis, unspecified: Secondary | ICD-10-CM

## 2019-05-01 DIAGNOSIS — R059 Cough, unspecified: Secondary | ICD-10-CM

## 2019-05-01 DIAGNOSIS — R05 Cough: Secondary | ICD-10-CM

## 2019-05-01 MED ORDER — BENZONATATE 200 MG PO CAPS: 200.0000 mg | ORAL_CAPSULE | Freq: Two times a day (BID) | ORAL | 0 refills | Status: DC | PRN

## 2019-05-01 MED ORDER — AMOXICILLIN-POT CLAVULANATE 875-125 MG PO TABS: 1.0000 | ORAL_TABLET | Freq: Two times a day (BID) | ORAL | 0 refills | Status: DC

## 2019-05-01 NOTE — Progress Notes (Signed)
Temp (!) 97.4 F (36.3 C)   LMP  (LMP Unknown)    Subjective:    Patient ID: Brittany Ochoa, female    DOB: [birth year], 54 y.o.   MRN: [id number]  HPI: Brittany Ochoa is a 54 y.o. female  Chief Complaint  Patient presents with  . Follow-up    Covid Sx   UPPER RESPIRATORY TRACT INFECTION- out of her quarantine for COVID Duration: 2 days Worst symptom: Fever: no Cough: yes Shortness of breath: yes Wheezing: yes Chest pain: no Chest tightness: yes Chest congestion: yes Nasal congestion: yes Runny nose: no Post nasal drip: no Sneezing: no Sore throat: no Swollen glands: no Sinus pressure: yes Headache: no Face pain: no Toothache: no Ear pain: no  Ear pressure: yes left Eyes red/itching:no Eye drainage/crusting: no  Vomiting: no Rash: no Fatigue: yes Sick contacts: yes Strep contacts: no  Context: worse Recurrent sinusitis: no Relief with OTC cold/cough medications: no  Treatments attempted: cold/sinus, mucinex, anti-histamine, pseudoephedrine and cough syrup   Relevant past medical, surgical, family and social history reviewed and updated as indicated. Interim medical history since our last visit reviewed. Allergies and medications reviewed and updated.  Review of Systems  Constitutional: Positive for fatigue. Negative for activity change, appetite change, chills, diaphoresis, fever and unexpected weight change.  HENT: Positive for congestion, postnasal drip, rhinorrhea and sinus pressure. Negative for dental problem, drooling, ear discharge, ear pain, facial swelling, hearing loss, mouth sores, nosebleeds, sinus pain, sneezing, sore throat, tinnitus, trouble swallowing and voice change.   Respiratory: Positive for cough, chest tightness, shortness of breath and wheezing. Negative for apnea, choking and stridor.   Cardiovascular: Negative.   Gastrointestinal: Negative.   Psychiatric/Behavioral: Negative.     Per HPI unless specifically indicated  above     Objective:    Temp (!) 97.4 F (36.3 C)   LMP  (LMP Unknown)   Wt Readings from Last 3 Encounters:  04/19/19 185 lb (83.9 kg)  01/14/19 200 lb (90.7 kg)  12/16/18 195 lb (88.5 kg)    Physical Exam Vitals and nursing note reviewed.  Constitutional:      General: She is not in acute distress.    Appearance: Normal appearance. She is not ill-appearing, toxic-appearing or diaphoretic.  HENT:     Head: Normocephalic and atraumatic.     Right Ear: External ear normal.     Left Ear: External ear normal.     Nose: Nose normal.     Mouth/Throat:     Mouth: Mucous membranes are moist.     Pharynx: Oropharynx is clear.  Eyes:     General: No scleral icterus.       Right eye: No discharge.        Left eye: No discharge.     Conjunctiva/sclera: Conjunctivae normal.     Pupils: Pupils are equal, round, and reactive to light.  Pulmonary:     Effort: Pulmonary effort is normal. No respiratory distress.     Comments: Speaking in full sentences Musculoskeletal:        General: Normal range of motion.     Cervical back: Normal range of motion.  Skin:    Coloration: Skin is not jaundiced or pale.     Findings: No bruising, erythema, lesion or rash.  Neurological:     Mental Status: She is alert and oriented to person, place, and time. Mental status is at baseline.  Psychiatric:        Mood and Affect:  Mood normal.        Behavior: Behavior normal.        Thought Content: Thought content normal.        Judgment: Judgment normal.     Results for orders placed or performed during the hospital encounter of [id number]  Basic metabolic panel  Result Value Ref Range   Sodium 141 135 - 145 mmol/L   Potassium 3.5 3.5 - 5.1 mmol/L   Chloride 104 98 - 111 mmol/L   CO2 26 22 - 32 mmol/L   Glucose, Bld 127 (H) 70 - 99 mg/dL   BUN 14 6 - 20 mg/dL   Creatinine, Ser 0.61 0.44 - 1.00 mg/dL   Calcium 9.2 8.9 - 10.3 mg/dL   GFR calc non Af Amer >60 >60 mL/min   GFR calc Af Amer >60  >60 mL/min   Anion gap 11 5 - 15  CBC  Result Value Ref Range   WBC 5.0 4.0 - 10.5 K/uL   RBC 4.88 3.87 - 5.11 MIL/uL   Hemoglobin 14.4 12.0 - 15.0 g/dL   HCT 43.3 36.0 - 46.0 %   MCV 88.7 80.0 - 100.0 fL   MCH 29.5 26.0 - 34.0 pg   MCHC 33.3 30.0 - 36.0 g/dL   RDW 12.6 11.5 - 15.5 %   Platelets 209 150 - 400 K/uL   nRBC 0.0 0.0 - 0.2 %  Urinalysis, Complete w Microscopic  Result Value Ref Range   Color, Urine STRAW (A) YELLOW   APPearance CLEAR (A) CLEAR   Specific Gravity, Urine 1.006 1.005 - 1.030   pH 7.0 5.0 - 8.0   Glucose, UA NEGATIVE NEGATIVE mg/dL   Hgb urine dipstick NEGATIVE NEGATIVE   Bilirubin Urine NEGATIVE NEGATIVE   Ketones, ur NEGATIVE NEGATIVE mg/dL   Protein, ur NEGATIVE NEGATIVE mg/dL   Nitrite NEGATIVE NEGATIVE   Leukocytes,Ua NEGATIVE NEGATIVE   RBC / HPF 0-5 0 - 5 RBC/hpf   WBC, UA 0-5 0 - 5 WBC/hpf   Bacteria, UA NONE SEEN NONE SEEN   Squamous Epithelial / LPF NONE SEEN 0 - 5  Troponin I (High Sensitivity)  Result Value Ref Range   Troponin I (High Sensitivity) 3 <18 ng/L      Assessment & Plan:   Problem List Items Addressed This Visit    None    Visit Diagnoses    Acute non-recurrent maxillary sinusitis    -  Primary   Will treat with augmentin and tessalon perles. Call if not getting better or getting worse. Continue to monitor.    Relevant Medications   amoxicillin-clavulanate (AUGMENTIN) 875-125 MG tablet   benzonatate (TESSALON) 200 MG capsule   Cough       Will check CXR to make sure no sign of pneumonia. Await results. Call with any concerns. Continue to monitor.    Relevant Orders   DG Chest 2 View       Follow up plan: Return if symptoms worsen or fail to improve.   . This visit was completed via FaceTime due to the restrictions of the COVID-19 pandemic. All issues as above were discussed and addressed. Physical exam was done as above through visual confirmation on FaceTime. If it was felt that the patient should be evaluated  in the office, they were directed there. The patient verbally consented to this visit. . Location of the patient: work . Location of the provider: home . Those involved with this call:  . Provider: Park Liter, DO .  CMA: Tiffany Reel, CMA . Front Desk/Registration: Don Perking  . Time spent on call: 15 minutes with patient face to face via video conference. More than 50% of this time was spent in counseling and coordination of care. 23 minutes total spent in review of patient's record and preparation of their chart.

## 2019-05-02 ENCOUNTER — Other Ambulatory Visit: Payer: Self-pay

## 2019-05-02 ENCOUNTER — Ambulatory Visit
Admission: RE | Admit: 2019-05-02 | Discharge: 2019-05-02 | Disposition: A | Discharge status: Home / Self Care | Payer: 59 | Source: Ambulatory Visit | Attending: Family Medicine | Admitting: Family Medicine

## 2019-05-02 DIAGNOSIS — R059 Cough, unspecified: Secondary | ICD-10-CM

## 2019-05-02 DIAGNOSIS — R05 Cough: Secondary | ICD-10-CM | POA: Diagnosis not present

## 2019-05-27 ENCOUNTER — Other Ambulatory Visit: Payer: Self-pay

## 2019-05-27 ENCOUNTER — Ambulatory Visit (INDEPENDENT_AMBULATORY_CARE_PROVIDER_SITE_OTHER): Payer: 59 | Admitting: Family Medicine

## 2019-05-27 ENCOUNTER — Other Ambulatory Visit: Payer: Self-pay | Admitting: Family Medicine

## 2019-05-27 ENCOUNTER — Encounter: Payer: Self-pay | Admitting: Family Medicine

## 2019-05-27 VITALS — BP 140/87 | HR 76 | Temp 99.1°F

## 2019-05-27 DIAGNOSIS — M359 Systemic involvement of connective tissue, unspecified: Secondary | ICD-10-CM | POA: Diagnosis not present

## 2019-05-27 DIAGNOSIS — R1900 Intra-abdominal and pelvic swelling, mass and lump, unspecified site: Secondary | ICD-10-CM

## 2019-05-27 DIAGNOSIS — R42 Dizziness and giddiness: Secondary | ICD-10-CM

## 2019-05-27 DIAGNOSIS — R0981 Nasal congestion: Secondary | ICD-10-CM

## 2019-05-27 DIAGNOSIS — N950 Postmenopausal bleeding: Secondary | ICD-10-CM

## 2019-05-27 DIAGNOSIS — Z1211 Encounter for screening for malignant neoplasm of colon: Secondary | ICD-10-CM

## 2019-05-27 DIAGNOSIS — R232 Flushing: Secondary | ICD-10-CM | POA: Diagnosis not present

## 2019-05-27 MED ORDER — FLUTICASONE PROPIONATE 50 MCG/ACT NA SUSP: 2.0000 | Freq: Every day | NASAL | 6 refills | Status: DC

## 2019-05-27 NOTE — Progress Notes (Signed)
BP 140/87   Pulse 76   Temp 99.1 F (37.3 C) (Oral)   LMP  (LMP Unknown)   SpO2 97%    Subjective:    Patient ID: Brittany Ochoa, female    DOB: 09-25-1965, 54 y.o.   MRN: [id number]  HPI: Brittany Ochoa is a 54 y.o. female  Chief Complaint  Patient presents with  . Vaginal Bleeding   [name] comes in today with several concerns. She notes that before she had covid she noticed that she was very tired. She thought it was from the holidays. 1/2-8 she had a period that was very light. She hadn't had a period since May 2019 prior to that. She is seeing GYN on 3/31, but wanted to get checked out. She notes that she has been very irritable and also having hot flashes that seem to come on around the time of the vaginal bleeding.   She also notes that she had been getting dizzy, got anxious and having her blood pressure shoot up. She feels it in her chest and then is shoots up into her head. She will get a flushing in her chest that will go up into her head and make her very anxious. She is not sure if it's due to her hormones or if it's due to her sinuses. She notes that since her COVID she has had a lot of nasal congestion and it doesn't seem to be going away. She has been using her flonase and has had antibiotics and steroids, but doesn't seem to be getting better.    She recently found out that her grandmother and her father died of bone cancer rather than stomach cancer.   Had issues with her flu shot in 2019. Has had a lump in her arm since then and has had shooting pains down her arm since then. She had severe pain after her last flu shot and had to have an anti-inflammatory and it got better. When she got her flu shot in 2020 it took 3 days for her to start feeling better. She was aching and sore and had severe pain in her legs from the shot. In 2011 she had carpal tunnel that went away. In 2020 after her last flu shot, her carpal tunnel came back. Severe bone pain with the flu shot  for about 2 months. She is otherwise doing well with no other concerns or complaints at this time.  Relevant past medical, surgical, family and social history reviewed and updated as indicated. Interim medical history since our last visit reviewed. Allergies and medications reviewed and updated.  Review of Systems  Constitutional: Negative.   Respiratory: Negative.   Cardiovascular: Negative.   Gastrointestinal: Positive for abdominal distention and abdominal pain. Negative for anal bleeding, blood in stool, constipation, diarrhea, nausea, rectal pain and vomiting.  Genitourinary: Positive for menstrual problem, pelvic pain and vaginal bleeding. Negative for decreased urine volume, difficulty urinating, dyspareunia, dysuria, enuresis, flank pain, frequency, genital sores, hematuria, urgency, vaginal discharge and vaginal pain.  Musculoskeletal: Negative.   Skin: Negative.   Neurological: Positive for light-headedness. Negative for dizziness, tremors, seizures, syncope, facial asymmetry, speech difficulty, weakness, numbness and headaches.  Psychiatric/Behavioral: Positive for agitation. Negative for behavioral problems, confusion, decreased concentration, dysphoric mood, hallucinations, self-injury, sleep disturbance and suicidal ideas. The patient is not nervous/anxious and is not hyperactive.     Per HPI unless specifically indicated above     Objective:    BP 140/87   Pulse 76  Temp 99.1 F (37.3 C) (Oral)   LMP  (LMP Unknown)   SpO2 97%   Wt Readings from Last 3 Encounters:  04/19/19 185 lb (83.9 kg)  01/14/19 200 lb (90.7 kg)  12/16/18 195 lb (88.5 kg)    Physical Exam Vitals and nursing note reviewed.  Constitutional:      General: She is not in acute distress.    Appearance: Normal appearance. She is not ill-appearing, toxic-appearing or diaphoretic.  HENT:     Head: Normocephalic and atraumatic.     Right Ear: External ear normal.     Left Ear: External ear normal.       Nose: Nose normal.     Mouth/Throat:     Mouth: Mucous membranes are moist.     Pharynx: Oropharynx is clear.  Eyes:     General: No scleral icterus.       Right eye: No discharge.        Left eye: No discharge.     Extraocular Movements: Extraocular movements intact.     Conjunctiva/sclera: Conjunctivae normal.     Pupils: Pupils are equal, round, and reactive to light.  Cardiovascular:     Rate and Rhythm: Normal rate and regular rhythm.     Pulses: Normal pulses.     Heart sounds: Normal heart sounds. No murmur. No friction rub. No gallop.   Pulmonary:     Effort: Pulmonary effort is normal. No respiratory distress.     Breath sounds: Normal breath sounds. No stridor. No wheezing, rhonchi or rales.  Chest:     Chest wall: No tenderness.  Musculoskeletal:        General: Normal range of motion.     Cervical back: Normal range of motion and neck supple.  Skin:    General: Skin is warm and dry.     Capillary Refill: Capillary refill takes less than 2 seconds.     Coloration: Skin is not jaundiced or pale.     Findings: No bruising, erythema, lesion or rash.  Neurological:     General: No focal deficit present.     Mental Status: She is alert and oriented to person, place, and time. Mental status is at baseline.  Psychiatric:        Mood and Affect: Mood normal.        Behavior: Behavior normal.        Thought Content: Thought content normal.        Judgment: Judgment normal.     Results for orders placed or performed in visit on 05/27/19  [facility]  Result Value Ref Range   LH 35.6 mIU/mL  FSH  Result Value Ref Range   FSH 82.4 mIU/mL  Estradiol  Result Value Ref Range   Estradiol <5.0 pg/mL  Testosterone, free, total([facility])  Result Value Ref Range   Testosterone 3 3 - 41 ng/dL   Testosterone, Free WILL FOLLOW    Sex Hormone Binding 42.2 17.3 - 125.0 nmol/L  CBC with Differential/Platelet  Result Value Ref Range   WBC 7.4 3.4 - 10.8 x10E3/uL   RBC  4.40 3.77 - 5.28 x10E6/uL   Hemoglobin 13.4 11.1 - 15.9 g/dL   Hematocrit 39.2 34.0 - 46.6 %   MCV 89 79 - 97 fL   MCH 30.5 26.6 - 33.0 pg   MCHC 34.2 31.5 - 35.7 g/dL   RDW 12.8 11.7 - 15.4 %   Platelets 248 150 - 450 x10E3/uL   Neutrophils 66 Not Estab. %  Lymphs 25 Not Estab. %   Monocytes 7 Not Estab. %   Eos 1 Not Estab. %   Basos 1 Not Estab. %   Neutrophils Absolute 4.8 1.4 - 7.0 x10E3/uL   Lymphocytes Absolute 1.9 0.7 - 3.1 x10E3/uL   Monocytes Absolute 0.5 0.1 - 0.9 x10E3/uL   EOS (ABSOLUTE) 0.1 0.0 - 0.4 x10E3/uL   Basophils Absolute 0.0 0.0 - 0.2 x10E3/uL   Immature Granulocytes 0 Not Estab. %   Immature Grans (Abs) 0.0 0.0 - 0.1 x10E3/uL  Comprehensive metabolic panel  Result Value Ref Range   Glucose 100 (H) 65 - 99 mg/dL   BUN 14 6 - 24 mg/dL   Creatinine, Ser 0.72 0.57 - 1.00 mg/dL   GFR calc non Af Amer 97 >59 mL/min/1.73   GFR calc Af Amer 112 >59 mL/min/1.73   BUN/Creatinine Ratio 19 9 - 23   Sodium 143 134 - 144 mmol/L   Potassium 3.9 3.5 - 5.2 mmol/L   Chloride 103 96 - 106 mmol/L   CO2 26 20 - 29 mmol/L   Calcium 9.1 8.7 - 10.2 mg/dL   Total Protein 6.4 6.0 - 8.5 g/dL   Albumin 4.6 3.8 - 4.9 g/dL   Globulin, Total 1.8 1.5 - 4.5 g/dL   Albumin/Globulin Ratio 2.6 (H) 1.2 - 2.2   Bilirubin Total 0.3 0.0 - 1.2 mg/dL   Alkaline Phosphatase 96 39 - 117 IU/L   AST 27 0 - 40 IU/L   ALT 28 0 - 32 IU/L  TSH  Result Value Ref Range   TSH 0.739 0.450 - 4.500 uIU/mL  UA/M w/rflx Culture, Routine   Specimen: Urine   URINE  Result Value Ref Range   Specific Gravity, UA 1.025 1.005 - 1.030   pH, UA 7.0 5.0 - 7.5   Color, UA Yellow Yellow   Appearance Ur Clear Clear   Leukocytes,UA Negative Negative   Protein,UA Negative Negative/Trace   Glucose, UA Negative Negative   Ketones, UA Negative Negative   RBC, UA Negative Negative   Bilirubin, UA Negative Negative   Urobilinogen, Ur 0.2 0.2 - 1.0 mg/dL   Nitrite, UA Negative Negative  VITAMIN D 25 Hydroxy  (Vit-D Deficiency, Fractures)  Result Value Ref Range   Vit D, 25-Hydroxy 20.5 (L) 30.0 - 100.0 ng/mL      Assessment & Plan:   Problem List Items Addressed This Visit      Other   Autoimmune disease ([place])    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        Other Visit Diagnoses    Pelvic fullness in female    -  Primary   Will check labs and obtain US. Await results. Due to see GYN at the end of March. Call with any concerns    Relevant Orders   CBC with Differential/Platelet (Completed)   UA/M w/rflx Culture, Routine (Completed)   US Pelvic Complete With Transvaginal   Post-menopausal bleeding       Will check labs and obtain US. Await results. Due to see GYN at the end of March. Call with any concerns    Relevant Orders   LH (Completed)   FSH (Completed)   Estradiol (Completed)   Testosterone, free, total([facility]) (Completed)   CBC with Differential/Platelet (Completed)   TSH (Completed)   US Pelvic Complete With Transvaginal   Flushing       Of unclear etiology. Will check labs. Continue to monitor.  Call with any concerns.    Relevant Orders   CBC with Differential/Platelet (Completed)   Comprehensive metabolic panel (Completed)   VITAMIN D 25 Hydroxy (Vit-D Deficiency, Fractures) (Completed)   Light headed       Of unclear etiology. Will check labs. Continue to monitor. Call with any concerns.    Relevant Orders   CBC with Differential/Platelet (Completed)   Comprehensive metabolic panel (Completed)   VITAMIN D 25 Hydroxy (Vit-D Deficiency, Fractures) (Completed)   Chronic nasal congestion       Not getting better. Conitnue flonase and refer to ENT. Call with any concerns. Continue to monitor.    Relevant Orders   Ambulatory referral to ENT   Screening for colon cancer       Wants to do cologuard. Ordered today.   Relevant Orders   Cologuard       Follow up plan: Return in about 4 weeks (around  06/24/2019).

## 2019-05-27 NOTE — Assessment & Plan Note (Signed)
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

## 2019-05-28 ENCOUNTER — Encounter: Payer: Self-pay | Admitting: Family Medicine

## 2019-05-28 LAB — UA/M W/RFLX CULTURE, ROUTINE
Bilirubin, UA: NEGATIVE
Glucose, UA: NEGATIVE
Ketones, UA: NEGATIVE
Leukocytes,UA: NEGATIVE
Nitrite, UA: NEGATIVE
Protein,UA: NEGATIVE
RBC, UA: NEGATIVE
Specific Gravity, UA: 1.025 (ref 1.005–1.030)
Urobilinogen, Ur: 0.2 mg/dL (ref 0.2–1.0)
pH, UA: 7 (ref 5.0–7.5)

## 2019-05-29 ENCOUNTER — Encounter: Payer: Self-pay | Admitting: Family Medicine

## 2019-05-29 LAB — CBC WITH DIFFERENTIAL/PLATELET
Basophils Absolute: 0 10*3/uL (ref 0.0–0.2)
Basos: 1 %
EOS (ABSOLUTE): 0.1 10*3/uL (ref 0.0–0.4)
Eos: 1 %
Hematocrit: 39.2 % (ref 34.0–46.6)
Hemoglobin: 13.4 g/dL (ref 11.1–15.9)
Immature Grans (Abs): 0 10*3/uL (ref 0.0–0.1)
Immature Granulocytes: 0 %
Lymphocytes Absolute: 1.9 10*3/uL (ref 0.7–3.1)
Lymphs: 25 %
MCH: 30.5 pg (ref 26.6–33.0)
MCHC: 34.2 g/dL (ref 31.5–35.7)
MCV: 89 fL (ref 79–97)
Monocytes Absolute: 0.5 10*3/uL (ref 0.1–0.9)
Monocytes: 7 %
Neutrophils Absolute: 4.8 10*3/uL (ref 1.4–7.0)
Neutrophils: 66 %
Platelets: 248 10*3/uL (ref 150–450)
RBC: 4.4 x10E6/uL (ref 3.77–5.28)
RDW: 12.8 % (ref 11.7–15.4)
WBC: 7.4 10*3/uL (ref 3.4–10.8)

## 2019-05-29 LAB — COMPREHENSIVE METABOLIC PANEL
ALT: 28 IU/L (ref 0–32)
AST: 27 IU/L (ref 0–40)
Albumin/Globulin Ratio: 2.6 — ABNORMAL HIGH (ref 1.2–2.2)
Albumin: 4.6 g/dL (ref 3.8–4.9)
Alkaline Phosphatase: 96 IU/L (ref 39–117)
BUN/Creatinine Ratio: 19 (ref 9–23)
BUN: 14 mg/dL (ref 6–24)
Bilirubin Total: 0.3 mg/dL (ref 0.0–1.2)
CO2: 26 mmol/L (ref 20–29)
Calcium: 9.1 mg/dL (ref 8.7–10.2)
Chloride: 103 mmol/L (ref 96–106)
Creatinine, Ser: 0.72 mg/dL (ref 0.57–1.00)
GFR calc Af Amer: 112 mL/min/{1.73_m2} (ref >59)
GFR calc non Af Amer: 97 mL/min/{1.73_m2} (ref >59)
Globulin, Total: 1.8 g/dL (ref 1.5–4.5)
Glucose: 100 mg/dL — ABNORMAL HIGH (ref 65–99)
Potassium: 3.9 mmol/L (ref 3.5–5.2)
Sodium: 143 mmol/L (ref 134–144)
Total Protein: 6.4 g/dL (ref 6.0–8.5)

## 2019-05-29 LAB — LUTEINIZING HORMONE: LH: 35.6 m[IU]/mL

## 2019-05-29 LAB — TESTOSTERONE, FREE, TOTAL, SHBG
Sex Hormone Binding: 42.2 nmol/L (ref 17.3–125.0)
Testosterone, Free: 0.7 pg/mL (ref 0.0–4.2)
Testosterone: 3 ng/dL (ref 3–41)

## 2019-05-29 LAB — ESTRADIOL: Estradiol: <5 pg/mL

## 2019-05-29 LAB — VITAMIN D 25 HYDROXY (VIT D DEFICIENCY, FRACTURES): Vit D, 25-Hydroxy: 20.5 ng/mL — ABNORMAL LOW (ref 30.0–100.0)

## 2019-05-29 LAB — FOLLICLE STIMULATING HORMONE: FSH: 82.4 m[IU]/mL

## 2019-05-29 LAB — TSH: TSH: 0.739 u[IU]/mL (ref 0.450–4.500)

## 2019-06-03 ENCOUNTER — Other Ambulatory Visit: Payer: Self-pay

## 2019-06-03 ENCOUNTER — Ambulatory Visit
Admission: RE | Admit: 2019-06-03 | Discharge: 2019-06-03 | Disposition: A | Discharge status: Home / Self Care | Payer: 59 | Source: Ambulatory Visit | Attending: Family Medicine | Admitting: Family Medicine

## 2019-06-03 DIAGNOSIS — N950 Postmenopausal bleeding: Secondary | ICD-10-CM | POA: Insufficient documentation

## 2019-06-03 DIAGNOSIS — R1900 Intra-abdominal and pelvic swelling, mass and lump, unspecified site: Secondary | ICD-10-CM | POA: Diagnosis not present

## 2019-06-03 DIAGNOSIS — N858 Other specified noninflammatory disorders of uterus: Secondary | ICD-10-CM | POA: Diagnosis not present

## 2019-06-06 ENCOUNTER — Encounter: Payer: Self-pay | Admitting: Family Medicine

## 2019-06-09 ENCOUNTER — Telehealth: Payer: Self-pay | Admitting: Obstetrics and Gynecology

## 2019-06-09 NOTE — Telephone Encounter (Signed)
Pt called in wanting to know what time since she is a new pt she should be here. I told her come 15 mins early please. Then the pt demanded to know what this appt was for. I told the pt its a new pt appt that her pcp dr. Wynetta Emery wants her to see a MD. The pt then yelled at me stating that she wants a biopsy done at this visit and that she was told she was getting one from her pcp. I informed the pt that I work in the front and I will send a message to the nurse and they can see that information. The pt is requesting a call back from the nurse at  [phone] this is her work number and she leaves at 4:00 pm. Please advise

## 2019-06-09 NOTE — Telephone Encounter (Signed)
Pt called in and stated that she was under the impression that she would be called back today. I told the pt im sorry I didn't tell you that earlier. But they have 24 hours to call you. The pt then was very rude and being charged if she has to cancel because she wants to have a biopsy done. I told her that it has to be ordered by the provider. The pt said that if someone calls her after 4 to call her cell.

## 2019-06-10 ENCOUNTER — Telehealth: Payer: Self-pay | Admitting: Obstetrics and Gynecology

## 2019-06-10 NOTE — Telephone Encounter (Signed)
LM for patient to return call.

## 2019-06-10 NOTE — Telephone Encounter (Signed)
Please see other encounter note.

## 2019-06-10 NOTE — Telephone Encounter (Signed)
Pt called in and stated that she has some questions on her visit tomorrow. This pt was snappy and wants to know if a biopsy will be done at the visit tomorrow she was told there would be. The pt has requested a call at  [phone] before 3:30pm and a call at  [phone] after 3:30 pm. Please advise

## 2019-06-10 NOTE — Telephone Encounter (Signed)
Patient is demanding a biopsy be done tomorrow. She sated that her PCP said that the appointment needed to be moved so she could have biopsy sooner. Patient stated that she works at the [employer] [facility] and see it all the time where the cancer comes back. I told her that I would give Dr. Amalia Hailey the message that she is wanting a biopsy and that he would discuss with her tomorrow. Patient verbalized understanding.

## 2019-06-11 ENCOUNTER — Other Ambulatory Visit: Payer: Self-pay

## 2019-06-11 ENCOUNTER — Encounter: Payer: Self-pay | Admitting: Obstetrics and Gynecology

## 2019-06-11 ENCOUNTER — Telehealth: Payer: Self-pay | Admitting: Obstetrics and Gynecology

## 2019-06-11 ENCOUNTER — Ambulatory Visit: Payer: 59 | Admitting: Obstetrics and Gynecology

## 2019-06-11 VITALS — BP 124/82 | HR 74 | Ht 63.0 in | Wt 189.6 lb

## 2019-06-11 DIAGNOSIS — D250 Submucous leiomyoma of uterus: Secondary | ICD-10-CM

## 2019-06-11 DIAGNOSIS — N950 Postmenopausal bleeding: Secondary | ICD-10-CM | POA: Diagnosis not present

## 2019-06-11 DIAGNOSIS — N951 Menopausal and female climacteric states: Secondary | ICD-10-CM | POA: Diagnosis not present

## 2019-06-11 NOTE — Progress Notes (Signed)
Patient comes in today for new patient appointment. She was referred due abnormal [place].

## 2019-06-11 NOTE — Telephone Encounter (Signed)
Patient called saying she decided she actually wanted to do a biopsy instead of a ultrasound. I told the patient that it's up to the provider and that I would let him know she had changed her mind. Could you please advise this patient? -TC

## 2019-06-11 NOTE — Progress Notes (Signed)
HPI:      [name] is a 54 y.o. No obstetric history on file. who LMP was No LMP recorded (lmp unknown). Patient is postmenopausal.  Subjective:   She presents today because after approximately 2 years of amenorrhea in menopause she had 1 week of bleeding.  An ultrasound revealed endometrial thickness of 6.7 mm which is an increase from 4 mm to years ago.  Also noted was a submucosal fundal fibroid.  Patient states " I have had fibroids my whole life". Patient reports that her postmenopausal bleeding was coincident with the diagnosis of COVID-19.  Of significant note patient has never had children, has been on OCPs " during her 86s." Reports normal regular cycles up until menopause although sometimes they were heavy.  States that " multiple cancers run in her family".  She is aware of genetic testing for inheritable conditions but has never had this done.    Hx: The following portions of the patient's history were reviewed and updated as appropriate:             She  has a past medical history of Ascending aortic aneurysm ([place]), Dyspnea on exertion, Multiple thyroid nodules, and Reflux esophagitis. She does not have any pertinent problems on file. She  has a past surgical history that includes Breast reduction surgery (1998); lipoma removal (2007); and Esophagogastroduodenoscopy (egd) with propofol (N/A, 01/24/2018). Her family history includes Cancer in her father, paternal grandfather, and paternal grandmother; Diabetes in her father and mother; Heart attack in her father; Heart disease in her father, paternal grandfather, and paternal grandmother; Osteoporosis in her maternal grandmother. She  reports that she quit smoking about 15 years ago. She has a 75.00 pack-year smoking history. She has never used smokeless tobacco. She reports current alcohol use. She reports that she does not use drugs. She has a current medication list which includes the following prescription(s):  albuterol, fluticasone, and losartan. She is allergic to codeine and medical provider ez flu shot [influenza vac split quad].       Review of Systems:  Review of Systems  Constitutional: Denied constitutional symptoms, night sweats, recent illness, fatigue, fever, insomnia and weight loss.  Eyes: Denied eye symptoms, eye pain, photophobia, vision change and visual disturbance.  Ears/Nose/Throat/Neck: Denied ear, nose, throat or neck symptoms, hearing loss, nasal discharge, sinus congestion and sore throat.  Cardiovascular: Denied cardiovascular symptoms, arrhythmia, chest pain/pressure, edema, exercise intolerance, orthopnea and palpitations.  Respiratory: Denied pulmonary symptoms, asthma, pleuritic pain, productive sputum, cough, dyspnea and wheezing.  Gastrointestinal: Denied, gastro-esophageal reflux, melena, nausea and vomiting.  Genitourinary: See HPI for additional information.  Musculoskeletal: Denied musculoskeletal symptoms, stiffness, swelling, muscle weakness and myalgia.  Dermatologic: Denied dermatology symptoms, rash and scar.  Neurologic: Denied neurology symptoms, dizziness, headache, neck pain and syncope.  Psychiatric: Denied psychiatric symptoms, anxiety and depression.  Endocrine: Denied endocrine symptoms including hot flashes and night sweats.   Meds:   Current Outpatient Medications on File Prior to Visit  Medication Sig Dispense Refill  . albuterol (PROVENTIL HFA;VENTOLIN HFA) 108 (90 Base) MCG/ACT inhaler Inhale 2 puffs into the lungs every 4 (four) hours as needed. 1 Inhaler 0  . fluticasone (FLONASE) 50 MCG/ACT nasal spray Place 2 sprays into both nostrils daily. 16 g 6  . losartan (COZAAR) 25 MG tablet Take 1 tablet (25 mg total) by mouth daily. (Patient not taking: Reported on 01/14/2019) 90 tablet 3   No current facility-administered medications on file prior to visit.  Objective:     Vitals:   06/11/19 0910  BP: 124/82  Pulse: 74               Ultrasound findings reviewed in detail with the patient.  Assessment:    No obstetric history on file. Patient Active Problem List   Diagnosis Date Noted  . Autoimmune disease ([place]) 05/27/2019  . Weight gain 01/14/2019  . Essential hypertension 12/16/2018  . Menopause 10/04/2018  . Esophageal dysphagia   . Stomach irritation   . OCD (obsessive compulsive disorder) 01/10/2018  . Exercise-induced asthma 10/26/2017  . Multiple thyroid nodules 07/15/2017  . Mitral regurgitation 07/15/2017  . History of radiation exposure 07/15/2017  . Ascending aortic aneurysm ([place]) 05/04/2017  . 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 Postmenopausal bleeding   2. Symptomatic menopausal or female climacteric states   3. Fibroids, submucosal     A single episode of bleeding in menopause with a slightly thickened endometrium.  I believe this is low risk for endometrial cancer.  It is also low risk for hyperplasia.  Her bleeding was associated in time with a diagnosis of COVID-19. Because of her low risk despite a slightly thickened endometrium and one episode of postmenopausal bleeding I have given her 2 choices.  See below   Plan:            1.  Choice 1 is to perform an endometrial biopsy today and rule out hyperplasia or malignancy.  Choice 2 is what the patient is chosen.  Expectant management of bleeding.  If no further bleeding ultrasound in 6 months to make sure the endometrium is not further thickened.  If the patient has any further episodes of bleeding she will present for endometrial biopsy.  2.  Because of collateral discussion regarding menopausal symptoms and the use of HRT in menopause she will consider returning for a visit to further discuss HRT.  3.  We have discussed the possibility of blood work for inheritable conditions/cancer screening and she will consider this. Orders Orders Placed This Encounter  Procedures  . US PELVIS (TRANSABDOMINAL ONLY)     No orders of the defined types were placed in this encounter.     F/U  Return in about 6 months (around 12/09/2019) for Pt to contact us if symptoms worsen, We will contact her with any abnormal test results. I spent 33 minutes involved in the care of this patient preparing to see the patient by obtaining and reviewing her medical history (including labs, imaging tests and prior procedures), documenting clinical information in the electronic health record (EHR), counseling and coordinating care plans, writing and sending prescriptions, ordering tests or procedures and directly communicating with the patient by discussing pertinent items from her history and physical exam as well as detailing my assessment and plan as noted above so that she has an informed understanding.  All of her questions were answered.  Finis Bud, M.D. 06/11/2019 10:06 AM

## 2019-06-12 MED ORDER — ESCITALOPRAM OXALATE 5 MG PO TABS: 5.0000 mg | ORAL_TABLET | Freq: Every day | ORAL | 1 refills | Status: DC

## 2019-06-12 NOTE — Telephone Encounter (Signed)
Called patient and scheduled her for biopsy.

## 2019-06-16 ENCOUNTER — Telehealth: Payer: Self-pay | Admitting: Cardiovascular Disease

## 2019-06-16 NOTE — Telephone Encounter (Signed)
Spoke with patient and she reports having increased blood pressures, dizziness, and at times feeling as if she was going to pass out. She wanted to come in and see provider so scheduled her to come in this week to see Dr. Fletcher Anon. Confirmed appointment with her and requested that she keep a log of her blood pressures between now and her appointment. Reviewed signs and symptoms to monitor for that would require immediate evaluation in the ED. She verbalized understanding, confirmed appointment, agreeable to monitor pressures, and had no further questions at this time.

## 2019-06-16 NOTE — Telephone Encounter (Signed)
Patient states since she had COVID in January, she has felt like she was going to pass out and her BP increases. States she went to see her PCP and she started her on some BP medication.   Pt c/o BP issue: STAT if pt c/o blurred vision, one-sided weakness or slurred speech  1. What are your last 5 BP readings?  This morning -159/96  2/26-167/100 Random days 150s over 90s  2. Are you having any other symptoms (ex. Dizziness, headache, blurred vision, passed out)? Feeling like she was going to pass out. States she gets this "feeling in my chest" and lightheaded, stated she feel like shes going to pass out   3. What is your BP issue? elevated

## 2019-06-19 ENCOUNTER — Other Ambulatory Visit: Payer: Self-pay

## 2019-06-19 ENCOUNTER — Ambulatory Visit (INDEPENDENT_AMBULATORY_CARE_PROVIDER_SITE_OTHER): Payer: 59 | Admitting: Cardiovascular Disease

## 2019-06-19 ENCOUNTER — Other Ambulatory Visit: Payer: Self-pay | Admitting: Cardiovascular Disease

## 2019-06-19 ENCOUNTER — Ambulatory Visit: Payer: 59 | Admitting: Obstetrics and Gynecology

## 2019-06-19 ENCOUNTER — Encounter: Payer: Self-pay | Admitting: Obstetrics and Gynecology

## 2019-06-19 ENCOUNTER — Other Ambulatory Visit (HOSPITAL_COMMUNITY)
Admission: RE | Admit: 2019-06-19 | Discharge: 2019-06-19 | Disposition: A | Discharge status: Home / Self Care | Payer: 59 | Source: Ambulatory Visit | Attending: Obstetrics and Gynecology | Admitting: Obstetrics and Gynecology

## 2019-06-19 ENCOUNTER — Encounter: Payer: Self-pay | Admitting: Cardiovascular Disease

## 2019-06-19 VITALS — BP 130/85 | HR 73 | Ht 63.0 in | Wt 194.0 lb

## 2019-06-19 VITALS — BP 143/89 | HR 72 | Ht 63.0 in | Wt 193.0 lb

## 2019-06-19 DIAGNOSIS — I7121 Aneurysm of the ascending aorta, without rupture: Secondary | ICD-10-CM

## 2019-06-19 DIAGNOSIS — I712 Thoracic aortic aneurysm, without rupture: Secondary | ICD-10-CM

## 2019-06-19 DIAGNOSIS — R9389 Abnormal findings on diagnostic imaging of other specified body structures: Secondary | ICD-10-CM

## 2019-06-19 DIAGNOSIS — R072 Precordial pain: Secondary | ICD-10-CM | POA: Diagnosis not present

## 2019-06-19 DIAGNOSIS — I10 Essential (primary) hypertension: Secondary | ICD-10-CM | POA: Diagnosis not present

## 2019-06-19 MED ORDER — METOPROLOL TARTRATE 100 MG PO TABS: ORAL_TABLET | ORAL | 0 refills | Status: DC

## 2019-06-19 MED ORDER — LOSARTAN POTASSIUM 50 MG PO TABS: 50.0000 mg | ORAL_TABLET | Freq: Every day | ORAL | 3 refills | Status: DC

## 2019-06-19 NOTE — Addendum Note (Signed)
Addended by: Durwin Glaze on: 06/19/2019 03:48 PM   Modules accepted: Orders

## 2019-06-19 NOTE — Patient Instructions (Signed)
Medication Instructions:  Your physician has recommended you make the following change in your medication:   INCREASE Losartan to 100 mg daily. An Rx has been sent to your pharmacy.  A one time dose of Metoprolol 100 mg to be taken 2 hours prior to your Cardiac CTA has been sent to your pharmacy.  *If you need a refill on your cardiac medications before your next appointment, please call your pharmacy*   Lab Work: You will need lab work prior to your CTA. Please have your lab (bmet) drawn at the [facility] 2-3 days prior to your CTA.   If you have labs (blood work) drawn today and your tests are completely normal, you will receive your results only by: Marland Kitchen MyChart Message (if you have MyChart) OR . A paper copy in the mail If you have any lab test that is abnormal or we need to change your treatment, we will call you to review the results.   Testing/Procedures: Your physician has requested that you have cardiac CT. Cardiac computed tomography (CT) is a painless test that uses an x-ray machine to take clear, detailed pictures of your heart. For further information please visit [email or web address]. Please follow instruction sheet as given.      Follow-Up: At [facility], you and your health needs are our priority.  As part of our continuing mission to provide you with exceptional heart care, we have created designated Provider Care Teams.  These Care Teams include your primary Cardiologist (physician) and Advanced Practice Providers (APPs -  Physician Assistants and Nurse Practitioners) who all work together to provide you with the care you need, when you need it.  We recommend signing up for the patient portal called "MyChart".  Sign up information is provided on this After Visit Summary.  MyChart is used to connect with patients for Virtual Visits (Telemedicine).  Patients are able to view lab/test results, encounter notes, upcoming appointments, etc.  Non-urgent messages can be  sent to your provider as well.   To learn more about what you can do with MyChart, go to [email or web address].    Your next appointment:   3 month(s)  The format for your next appointment:   In Person  Provider:    You may see Kathlyn Sacramento, MD or one of the following Advanced Practice Providers on your designated Care Team:    Murray Hodgkins, NP  Christell Faith, PA-C  Marrianne Mood, PA-C    Other Instructions Your cardiac CT will be scheduled at one of the below locations:   [facility] [address] [place], [place] [address] [phone]  [facility] [address] [place],  [address] [phone]  If scheduled at [facility], please arrive at the [facility] main entrance of [facility] 30 minutes prior to test start time. Proceed to the [facility] Radiology Department (first floor) to check-in and test prep.  If scheduled at [facility], please arrive 15 mins early for check-in and test prep.  Please follow these instructions carefully (unless otherwise directed):   On the Night Before the Test: . Be sure to Drink plenty of water. . Do not consume any caffeinated/decaffeinated beverages or chocolate 12 hours prior to your test. . Do not take any antihistamines 12 hours prior to your test. I On the Day of the Test: . Drink plenty of water. Do not drink any water within one hour of the  test. . Do not eat any food 4 hours prior to the test. . You may take your regular medications prior to the test.  . Take metoprolol (Lopressor) two hours prior to test. . FEMALES- please wear underwire-free bra if available       After the Test: . Drink plenty of water. . After receiving IV contrast, you may experience a mild flushed feeling. This is normal. . On occasion, you may experience a mild rash up to 24 hours after the test. This is not dangerous.  If this occurs, you can take Benadryl 25 mg and increase your fluid intake. . If you experience trouble breathing, this can be serious. If it is severe call 911 IMMEDIATELY. If it is mild, please call our office. . If you take any of these medications: Glipizide/Metformin, Avandament, Glucavance, please do not take 48 hours after completing test unless otherwise instructed.   Once we have confirmed authorization from your insurance company, we will call you to set up a date and time for your test.   For non-scheduling related questions, please contact the cardiac imaging nurse navigator should you have any questions/concerns: Marchia Bond, RN Navigator Cardiac Imaging [facility] [phone] mobile

## 2019-06-19 NOTE — Progress Notes (Signed)
Cardiology Office Note   Date:  06/19/2019   ID:  Brittany Ochoa, DOB Aug 05, 1965, MRN [id number]  PCP:  [name], DO  Cardiologist:   Kathlyn Sacramento, MD   Chief Complaint  Patient presents with  . OTHER    Ortho BP, pounding Heart, Dizziness and faint. Meds reviewed verbally with pt.      History of Present Illness: Brittany Ochoa is a 54 y.o. female who is here today for follow-up visit regarding palpitations and essential hypertension.  She is a previous smoker but quit in 2006.  She is known to have small ascending aortic aneurysm measuring 4.1 x 4 cm.  She sees Dr. Cyndia Bent on a yearly basis.  She was seen last year by [place] for elevated blood pressure and was started on losartan 25 mg once daily.  Her blood pressure normalized and because of blood pressure was normal, she stopped losartan.  She did complain of increased palpitations and underwent a 7-day ZIO monitor in September 2020 which showed normal sinus rhythm with isolated PACs.  Some of her events correlated with PACs but most of the events did not correlate with arrhythmia.  She was diagnosed with COVID-19 in December and since then she noted that her blood pressure and heart rate increased.  In addition, she has been having frequent intermittent episodes of substernal chest pain and tightness occasionally with exertion.  Due to that, she started restricting her physical activities.  When she has chest pain, she gets lightheaded.  She also complains of increased palpitations.  Past Medical History:  Diagnosis Date  . Ascending aortic aneurysm ([place])    a. 06/2018 CTA Chest: 4.1 x 4.0 cm fusiform Asc Ao aneurysm-unchanged.  Descending thoracic aorta 2.1 cm.  Marland Kitchen Dyspnea on exertion    a. 06/2017 echo: EF 60 to 65%.  No regional wall motion abnormalities.  Ascending aortic diameter 3.9 cm.  Mild MR.  . Multiple thyroid nodules   . Reflux esophagitis     Past Surgical History:  Procedure Laterality Date  . BREAST  REDUCTION SURGERY  1998  . ESOPHAGOGASTRODUODENOSCOPY (EGD) WITH PROPOFOL N/A 01/24/2018   Procedure: ESOPHAGOGASTRODUODENOSCOPY (EGD) WITH PROPOFOL;  Surgeon: [name], MD;  Location: [facility] ENDOSCOPY;  Service: Endoscopy;  Laterality: N/A;  . lipoma removal  2007   back     Current Outpatient Medications  Medication Sig Dispense Refill  . albuterol (PROVENTIL HFA;VENTOLIN HFA) 108 (90 Base) MCG/ACT inhaler Inhale 2 puffs into the lungs every 4 (four) hours as needed. 1 Inhaler 0  . escitalopram (LEXAPRO) 5 MG tablet Take 1 tablet (5 mg total) by mouth daily. 30 tablet 1  . fluticasone (FLONASE) 50 MCG/ACT nasal spray Place 2 sprays into both nostrils daily. (Patient taking differently: Place 2 sprays into both nostrils daily as needed. ) 16 g 6  . losartan (COZAAR) 25 MG tablet Take 1 tablet (25 mg total) by mouth daily. 90 tablet 3   No current facility-administered medications for this visit.    Allergies:   Codeine and Medical provider ez flu shot [influenza vac split quad]    Social History:  The patient  reports that she quit smoking about 15 years ago. She has a 75.00 pack-year smoking history. She has never used smokeless tobacco. She reports current alcohol use. She reports that she does not use drugs.   Family History:  The patient's family history includes Cancer in her father, paternal grandfather, and paternal grandmother; Diabetes in her  father and mother; Heart attack in her father; Heart disease in her father, paternal grandfather, and paternal grandmother; Osteoporosis in her maternal grandmother.    ROS:  Please see the history of present illness.   Otherwise, review of systems are positive for none.   All other systems are reviewed and negative.    PHYSICAL EXAM: VS:  BP (!) 143/89 (BP Location: Left Arm, Patient Position: Sitting, Cuff Size: Normal)   Pulse 72   Ht 5\' 3"  (1.6 m)   Wt 193 lb (87.5 kg)   LMP  (LMP Unknown)   SpO2 98%   BMI 34.19 kg/m   , BMI Body mass index is 34.19 kg/m. GEN: Well nourished, well developed, in no acute distress  HEENT: normal  Neck: no JVD, carotid bruits, or masses Cardiac: RRR; no murmurs, rubs, or gallops,no edema  Respiratory:  clear to auscultation bilaterally, normal work of breathing GI: soft, nontender, nondistended, + BS MS: no deformity or atrophy  Skin: warm and dry, no rash Neuro:  Strength and sensation are intact Psych: euthymic mood, full affect   EKG:  EKG is ordered today. The ekg ordered today demonstrates normal sinus rhythm with low voltage and no significant ST or T wave changes.   Recent Labs: 05/27/2019: ALT 28; BUN 14; Creatinine, Ser 0.72; Hemoglobin 13.4; Platelets 248; Potassium 3.9; Sodium 143; TSH 0.739    Lipid Panel    Component Value Date/Time   CHOL 219 (H) 10/04/2018 1600   TRIG 119 10/04/2018 1600   HDL 56 10/04/2018 1600   LDLCALC 139 (H) 10/04/2018 1600      Wt Readings from Last 3 Encounters:  06/19/19 193 lb (87.5 kg)  06/11/19 189 lb 9.6 oz (86 kg)  04/19/19 185 lb (83.9 kg)       No flowsheet data found.    ASSESSMENT AND PLAN:  1.  Chest pain with some anginal and some atypical features.  Symptoms persisted for more than a month.  Fortunately, her EKG does not show any ischemic changes.  I recommend evaluation with CTA of the coronary arteries with FFR if needed.  This can be used to evaluate the size of her ascending aortic aneurysm as she is due for CT this month anyway.  2.  Palpitations: Previous monitor showed only occasional PACs and most of her events did not correlate with arrhythmia.  3.  Small ascending aortic aneurysm: Followed by Dr. Cyndia Bent.  CTA of the coronary arteries showed be able to evaluate the size of the ascending aortic aneurysm.  4.  Essential hypertension: She resumed taking losartan 1 week ago and her blood pressure continues to be elevated.  I discussed with her the importance of controlling her blood pressure and  the presence of a sending aortic aneurysm.  I increased losartan to 50 mg daily.   Disposition:   FU with me in 3 months  Signed,  Kathlyn Sacramento, MD  06/19/2019 9:57 AM    [place]

## 2019-06-19 NOTE — Progress Notes (Signed)
HPI:      [name] is a 54 y.o. No obstetric history on file. who LMP was No LMP recorded (lmp unknown). Patient is postmenopausal.  Subjective:   She presents today for endometrial biopsy.  By previous ultrasound patient was found to have a slightly thickened endometrium.  She is experienced no issues with bleeding and the original plan was to continue with expectant management.  After getting home and thinking about it the patient decided that she would rather know now if there was any problems and changed her mind regarding endometrial biopsy.  She presents today for endometrial biopsy for possibly slightly thickened endometrium.    Hx: The following portions of the patient's history were reviewed and updated as appropriate:             She  has a past medical history of Ascending aortic aneurysm ([place]), Dyspnea on exertion, Multiple thyroid nodules, and Reflux esophagitis. She does not have any pertinent problems on file. She  has a past surgical history that includes Breast reduction surgery (1998); lipoma removal (2007); and Esophagogastroduodenoscopy (egd) with propofol (N/A, 01/24/2018). Her family history includes Cancer in her father, paternal grandfather, and paternal grandmother; Diabetes in her father and mother; Heart attack in her father; Heart disease in her father, paternal grandfather, and paternal grandmother; Osteoporosis in her maternal grandmother. She  reports that she quit smoking about 15 years ago. She has a 75.00 pack-year smoking history. She has never used smokeless tobacco. She reports current alcohol use. She reports that she does not use drugs. She has a current medication list which includes the following prescription(s): albuterol, escitalopram, fluticasone, losartan, and metoprolol tartrate. She is allergic to codeine and medical provider ez flu shot [influenza vac split quad].       Review of Systems:  Review of Systems  Constitutional: Denied  constitutional symptoms, night sweats, recent illness, fatigue, fever, insomnia and weight loss.  Eyes: Denied eye symptoms, eye pain, photophobia, vision change and visual disturbance.  Ears/Nose/Throat/Neck: Denied ear, nose, throat or neck symptoms, hearing loss, nasal discharge, sinus congestion and sore throat.  Cardiovascular: Denied cardiovascular symptoms, arrhythmia, chest pain/pressure, edema, exercise intolerance, orthopnea and palpitations.  Respiratory: Denied pulmonary symptoms, asthma, pleuritic pain, productive sputum, cough, dyspnea and wheezing.  Gastrointestinal: Denied, gastro-esophageal reflux, melena, nausea and vomiting.  Genitourinary: Denied genitourinary symptoms including symptomatic vaginal discharge, pelvic relaxation issues, and urinary complaints.  Musculoskeletal: Denied musculoskeletal symptoms, stiffness, swelling, muscle weakness and myalgia.  Dermatologic: Denied dermatology symptoms, rash and scar.  Neurologic: Denied neurology symptoms, dizziness, headache, neck pain and syncope.  Psychiatric: Denied psychiatric symptoms, anxiety and depression.  Endocrine: Denied endocrine symptoms including hot flashes and night sweats.   Meds:   Current Outpatient Medications on File Prior to Visit  Medication Sig Dispense Refill  . albuterol (PROVENTIL HFA;VENTOLIN HFA) 108 (90 Base) MCG/ACT inhaler Inhale 2 puffs into the lungs every 4 (four) hours as needed. 1 Inhaler 0  . escitalopram (LEXAPRO) 5 MG tablet Take 1 tablet (5 mg total) by mouth daily. 30 tablet 1  . fluticasone (FLONASE) 50 MCG/ACT nasal spray Place 2 sprays into both nostrils daily. (Patient taking differently: Place 2 sprays into both nostrils daily as needed. ) 16 g 6   No current facility-administered medications on file prior to visit.    Objective:     Vitals:   06/19/19 1453  BP: 130/85  Pulse: 73  Physical examination   Pelvic:   Vulva: Normal appearance.  No lesions.   Vagina: No lesions or abnormalities noted.  Support: Normal pelvic support.  Urethra No masses tenderness or scarring.  Meatus Normal size without lesions or prolapse.  Cervix: Normal appearance.  No lesions.  Anus: Normal exam.  No lesions.  Perineum: Normal exam.  No lesions.        Bimanual   Uterus: Normal size.  Non-tender.  Mobile.  AV.  Adnexae: No masses.  Non-tender to palpation.  Cul-de-sac: Negative for abnormality.   Endometrial Biopsy After discussion with the patient regarding her abnormal uterine bleeding I recommended that she proceed with an endometrial biopsy for further diagnosis. The risks, benefits, alternatives, and indications for an endometrial biopsy were discussed with the patient in detail. She understood the risks including infection, bleeding, cervical laceration and uterine perforation.  Verbal consent was obtained.   PROCEDURE NOTE:  Vacurette endometrial biopsy was performed using aseptic technique with iodine preparation.  The uterus was sounded to a length of 7cm cm.  Adequate sampling was obtained with minimal blood loss.  Scant tissue as expected.  The patient tolerated the procedure well.  Disposition will be pending pathology   Assessment:    No obstetric history on file. Patient Active Problem List   Diagnosis Date Noted  . Autoimmune disease ([place]) 05/27/2019  . Weight gain 01/14/2019  . Essential hypertension 12/16/2018  . Menopause 10/04/2018  . Esophageal dysphagia   . Stomach irritation   . OCD (obsessive compulsive disorder) 01/10/2018  . Exercise-induced asthma 10/26/2017  . Multiple thyroid nodules 07/15/2017  . Mitral regurgitation 07/15/2017  . History of radiation exposure 07/15/2017  . Ascending aortic aneurysm ([place]) 05/04/2017  . 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 Thickened endometrium     Doubt hyperplasia or malignancy   Plan:            1.  Await biopsy results and decide management  at that time. Orders No orders of the defined types were placed in this encounter.   No orders of the defined types were placed in this encounter.     F/U  Return for We will contact her with any abnormal test results. I spent 12 minutes involved in the care of this patient preparing to see the patient by obtaining and reviewing her medical history (including labs, imaging tests and prior procedures), documenting clinical information in the electronic health record (EHR), counseling and coordinating care plans, writing and sending prescriptions, ordering tests or procedures and directly communicating with the patient by discussing pertinent items from her history and physical exam as well as detailing my assessment and plan as noted above so that she has an informed understanding.  All of her questions were answered.  Finis Bud, M.D. 06/19/2019 3:16 PM

## 2019-06-23 LAB — SURGICAL PATHOLOGY

## 2019-06-25 ENCOUNTER — Encounter: Payer: Self-pay | Admitting: Obstetrics and Gynecology

## 2019-06-25 ENCOUNTER — Ambulatory Visit: Payer: 59 | Admitting: Obstetrics and Gynecology

## 2019-06-25 ENCOUNTER — Other Ambulatory Visit: Payer: Self-pay

## 2019-06-25 ENCOUNTER — Other Ambulatory Visit: Payer: Self-pay | Admitting: Emergency Medicine

## 2019-06-25 ENCOUNTER — Encounter (HOSPITAL_COMMUNITY): Payer: Self-pay

## 2019-06-25 VITALS — BP 123/88 | HR 83 | Ht 63.0 in | Wt 192.7 lb

## 2019-06-25 DIAGNOSIS — R232 Flushing: Secondary | ICD-10-CM

## 2019-06-25 DIAGNOSIS — I712 Thoracic aortic aneurysm, without rupture, unspecified: Secondary | ICD-10-CM

## 2019-06-25 DIAGNOSIS — Z79890 Hormone replacement therapy (postmenopausal): Secondary | ICD-10-CM

## 2019-06-25 NOTE — Progress Notes (Signed)
HPI:      [name] is a 54 y.o. No obstetric history on file. who LMP was No LMP recorded (lmp unknown). Patient is postmenopausal.  Subjective:   She presents today to discuss possible HRT.  She has had hot flashes in the past but she says they are not bad at this time.  She is mainly interested in taking HRT for the possibility of preventing osteoporosis and future heart disease.  Her grandmother had significant osteoporosis. Of significant note the patient has a personal history of heart issues including an aortic aneurysm.  She is going for further studies tomorrow regarding her aneurysm and her cardiac vasculature.  I have made a plan to her that estrogen may increase her risk of blood clots in light of aneurysm and significant cardiac vessel disease if present.     Hx: The following portions of the patient's history were reviewed and updated as appropriate:             She  has a past medical history of Ascending aortic aneurysm ([place]), Dyspnea on exertion, Multiple thyroid nodules, and Reflux esophagitis. She does not have any pertinent problems on file. She  has a past surgical history that includes Breast reduction surgery (1998); lipoma removal (2007); and Esophagogastroduodenoscopy (egd) with propofol (N/A, 01/24/2018). Her family history includes Cancer in her father, paternal grandfather, and paternal grandmother; Diabetes in her father and mother; Heart attack in her father; Heart disease in her father, paternal grandfather, and paternal grandmother; Osteoporosis in her maternal grandmother. She  reports that she quit smoking about 15 years ago. She has a 75.00 pack-year smoking history. She has never used smokeless tobacco. She reports current alcohol use. She reports that she does not use drugs. She has a current medication list which includes the following prescription(s): albuterol, escitalopram, fluticasone, losartan, and metoprolol tartrate. She is allergic to  codeine and medical provider ez flu shot [influenza vac split quad].       Review of Systems:  Review of Systems  Constitutional: Denied constitutional symptoms, night sweats, recent illness, fatigue, fever, insomnia and weight loss.  Eyes: Denied eye symptoms, eye pain, photophobia, vision change and visual disturbance.  Ears/Nose/Throat/Neck: Denied ear, nose, throat or neck symptoms, hearing loss, nasal discharge, sinus congestion and sore throat.  Cardiovascular: Denied cardiovascular symptoms, arrhythmia, chest pain/pressure, edema, exercise intolerance, orthopnea and palpitations.  Respiratory: Denied pulmonary symptoms, asthma, pleuritic pain, productive sputum, cough, dyspnea and wheezing.  Gastrointestinal: Denied, gastro-esophageal reflux, melena, nausea and vomiting.  Genitourinary: Denied genitourinary symptoms including symptomatic vaginal discharge, pelvic relaxation issues, and urinary complaints.  Musculoskeletal: Denied musculoskeletal symptoms, stiffness, swelling, muscle weakness and myalgia.  Dermatologic: Denied dermatology symptoms, rash and scar.  Neurologic: Denied neurology symptoms, dizziness, headache, neck pain and syncope.  Psychiatric: Denied psychiatric symptoms, anxiety and depression.  Endocrine: Denied endocrine symptoms including hot flashes and night sweats.   Meds:   Current Outpatient Medications on File Prior to Visit  Medication Sig Dispense Refill  . albuterol (PROVENTIL HFA;VENTOLIN HFA) 108 (90 Base) MCG/ACT inhaler Inhale 2 puffs into the lungs every 4 (four) hours as needed. 1 Inhaler 0  . escitalopram (LEXAPRO) 5 MG tablet Take 1 tablet (5 mg total) by mouth daily. 30 tablet 1  . fluticasone (FLONASE) 50 MCG/ACT nasal spray Place 2 sprays into both nostrils daily. (Patient taking differently: Place 2 sprays into both nostrils daily as needed. ) 16 g 6  . losartan (COZAAR) 50 MG tablet Take 1 tablet (  50 mg total) by mouth daily. 90 tablet 3  .  metoprolol tartrate (LOPRESSOR) 100 MG tablet Take 1 tablet (100mg ) by mouth 2 hours prior to CTA 1 tablet 0   No current facility-administered medications on file prior to visit.    Objective:     Vitals:   06/25/19 1600  BP: 123/88  Pulse: 83              Patient had previous postmenopausal bleeding and elected to undergo endometrial biopsy which proved negative for hyperplasia or malignancy.  Assessment:    No obstetric history on file. Patient Active Problem List   Diagnosis Date Noted  . Autoimmune disease ([place]) 05/27/2019  . Weight gain 01/14/2019  . Essential hypertension 12/16/2018  . Menopause 10/04/2018  . Esophageal dysphagia   . Stomach irritation   . OCD (obsessive compulsive disorder) 01/10/2018  . Exercise-induced asthma 10/26/2017  . Multiple thyroid nodules 07/15/2017  . Mitral regurgitation 07/15/2017  . History of radiation exposure 07/15/2017  . Ascending aortic aneurysm ([place]) 05/04/2017  . 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 Postmenopausal hormone therapy     Patient is considering postmenopausal HRT for the prevention of osteoporosis and heart disease.  This may be complicated by the presence of her aortic aneurysm and possible cardiac vascular issues.   Plan:            1.  Before beginning HRT I have expressly discussed the risks with her and asked her to discuss any potential risks with her cardiologist after her testing tomorrow.  Once she gets approval from the cardiologist we will then reconsider HRT.  The increased blood clot risk and stroke risk found to be coincident with beginning HRT was discussed in detail. HRT I have discussed HRT with the patient in detail.  The risk/benefits of it were reviewed.  She understands that during menopause Estrogen decreases dramatically and that this results in an increased risk of cardiovascular disease as well as osteoporosis.  We have also discussed the fact that hot  flashes often result from a decrease in Estrogen, and that by replacing Estrogen, they can often be alleviated.  We have discussed skin, vaginal and urinary tract changes that may also take place from this drop in Estrogen.  Emotional changes have also been linked to Estrogen and we have briefly discussed this.  The benefits of HRT including decrease in hot flashes, vaginal dryness, and osteoporosis were discussed.  The emotional benefit and a possible change in her cardiovascular risk profile was also reviewed.  The risks associated with Hormone Replacement Therapy were also reviewed.  The use of unopposed Estrogen and its relationship to endometrial cancer was discussed.  The addition of Progesterone and its beneficial effect on endometrial cancer was also noted.  The fact that there has been no consistent definitive studies showing an increase in breast cancer in women who use HRT was discussed with the patient.  The possible side effects including breast tenderness, fluid retention, mood changes and vaginal bleeding were discussed.  The patient was informed that this is an elective medication and that she may choose not to take Hormone Replacement Therapy.  Literature on HRT was made available, and I believe that after answering all of the patient's questions.  Special emphasis on the WHI study, as well as several studies since that pertaining to the risks and benefits of estrogen replacement therapy were compared.  The possible limitations of these  studies were discussed including the age stratification of the WHI study.  The possible increased role of Progesterone in these studies was discussed in detail.  Different types of hormone formulation and methods of taking hormone replacement therapy were discussed. Literature on HRT was made available, and I believe that after answering all of the patient's questions she has an adequate and informed understanding of the risks and benefits of HRT. Patient to inform  us of her cardiologist opinion for clearance regarding HRT before we will consider starting. Orders No orders of the defined types were placed in this encounter.   No orders of the defined types were placed in this encounter.     F/U  No follow-ups on file. I spent 31 minutes involved in the care of this patient preparing to see the patient by obtaining and reviewing her medical history (including labs, imaging tests and prior procedures), documenting clinical information in the electronic health record (EHR), counseling and coordinating care plans, writing and sending prescriptions, ordering tests or procedures and directly communicating with the patient by discussing pertinent items from her history and physical exam as well as detailing my assessment and plan as noted above so that she has an informed understanding.  All of her questions were answered.  Finis Bud, M.D. 06/25/2019 4:45 PM

## 2019-06-26 ENCOUNTER — Ambulatory Visit
Admission: RE | Admit: 2019-06-26 | Discharge: 2019-06-26 | Disposition: A | Discharge status: Home / Self Care | Payer: 59 | Source: Ambulatory Visit | Attending: Cardiovascular Disease | Admitting: Cardiovascular Disease

## 2019-06-26 DIAGNOSIS — I712 Thoracic aortic aneurysm, without rupture, unspecified: Secondary | ICD-10-CM

## 2019-06-26 DIAGNOSIS — R072 Precordial pain: Secondary | ICD-10-CM | POA: Insufficient documentation

## 2019-06-26 MED ORDER — METOPROLOL TARTRATE 5 MG/5ML IV SOLN: 5.0000 mg | INTRAVENOUS | Status: DC | PRN

## 2019-06-26 MED ORDER — IOHEXOL 350 MG/ML SOLN
150.0000 mL | Freq: Once | INTRAVENOUS | Status: AC | PRN
  Administered 2019-06-26: 150 mL via INTRAVENOUS

## 2019-06-26 MED ORDER — NITROGLYCERIN 0.4 MG SL SUBL
0.8000 mg | SUBLINGUAL_TABLET | Freq: Once | SUBLINGUAL | Status: AC
  Administered 2019-06-26: 0.8 mg via SUBLINGUAL

## 2019-06-30 ENCOUNTER — Ambulatory Visit: Payer: 59 | Admitting: Family Medicine

## 2019-06-30 DIAGNOSIS — L723 Sebaceous cyst: Secondary | ICD-10-CM | POA: Diagnosis not present

## 2019-07-03 DIAGNOSIS — L723 Sebaceous cyst: Secondary | ICD-10-CM | POA: Diagnosis not present

## 2019-07-07 ENCOUNTER — Other Ambulatory Visit: Payer: Self-pay

## 2019-07-07 ENCOUNTER — Ambulatory Visit (INDEPENDENT_AMBULATORY_CARE_PROVIDER_SITE_OTHER): Payer: 59 | Admitting: Family Medicine

## 2019-07-07 ENCOUNTER — Encounter: Payer: Self-pay | Admitting: Family Medicine

## 2019-07-07 VITALS — BP 123/78 | Temp 99.1°F | Ht 63.0 in | Wt 186.0 lb

## 2019-07-07 DIAGNOSIS — R509 Fever, unspecified: Secondary | ICD-10-CM

## 2019-07-07 NOTE — Progress Notes (Signed)
BP 123/78   Temp 99.1 F (37.3 C) (Oral)   Ht 5\' 3"  (1.6 m)   Wt 186 lb (84.4 kg)   LMP  (LMP Unknown)   BMI 32.95 kg/m    Subjective:    Patient ID: Brittany Ochoa, female    DOB: Jan 21, 1966, 54 y.o.   MRN: [id number]  HPI: Brittany Ochoa is a 54 y.o. female  Chief Complaint  Patient presents with  . Fever    last Friday 100.8. pt states symptoms have gotten better. pt taking abx  given for her back a week ago  . Back Pain    lower  . Chills  . Muscle Pain   Brittany Ochoa states that about 3 weeks ago she had a cyst on her back that got infected. She saw Dr. Bary Castilla and put her on abx and excised the lesion. She has been on abx (biaxin)  for a week. Back is looking normal, starting to get a little better, but still a bit tender. She notes that she was feeling well at the beginning of the week. She went to botox party on Tuesday with no issues. She has done this many times before. She then had fish from the hibachi place on Friday and noticed that it was tasting different. Within 2 hours of eating she had low back pain, pain in her knees. Fever 101.5. No intestinal problems. Since then she has had a low grade fever all the time. She is feeling better during the day, but starts feeling worse in the PM. Has been having chills and muscle aches. She notes that the muscles in her eyes hurt. She notes that it feels like the muscles in her eyes hurt and feel like they're straining. She also notes that her HR has been shooting up whenever she moves and then when she sits down it goes back to usual. She is otherwise feeling well. No other concerns or complaints at this time.   Relevant past medical, surgical, family and social history reviewed and updated as indicated. Interim medical history since our last visit reviewed. Allergies and medications reviewed and updated.  Review of Systems  Constitutional: Positive for chills, diaphoresis, fatigue and fever. Negative for activity change,  appetite change and unexpected weight change.  HENT: Negative.   Respiratory: Positive for shortness of breath. Negative for apnea, cough, choking, chest tightness, wheezing and stridor.   Cardiovascular: Positive for palpitations. Negative for chest pain and leg swelling.  Gastrointestinal: Negative.   Genitourinary: Negative.   Psychiatric/Behavioral: Negative.     Per HPI unless specifically indicated above     Objective:    BP 123/78   Temp 99.1 F (37.3 C) (Oral)   Ht 5\' 3"  (1.6 m)   Wt 186 lb (84.4 kg)   LMP  (LMP Unknown)   BMI 32.95 kg/m   Wt Readings from Last 3 Encounters:  07/07/19 186 lb (84.4 kg)  06/25/19 192 lb 11.2 oz (87.4 kg)  06/19/19 194 lb (88 kg)    Physical Exam Vitals and nursing note reviewed.  Pulmonary:     Effort: Pulmonary effort is normal. No respiratory distress.     Comments: Speaking in full sentences Neurological:     Mental Status: She is alert.  Psychiatric:        Mood and Affect: Mood normal.        Behavior: Behavior normal.        Thought Content: Thought content normal.  Judgment: Judgment normal.     Results for orders placed or performed in visit on 06/19/19  Surgical pathology  Result Value Ref Range   SURGICAL PATHOLOGY      SURGICAL PATHOLOGY CASE: [id number] PATIENT: [facility]     Clinical History: thickened endometrium (cm)     FINAL MICROSCOPIC DIAGNOSIS:  A. ENDOMETRIUM, BIOPSY: - Benign inactive endometrium - Negative for hyperplasia or malignancy     GROSS DESCRIPTION:  Received in formalin is blood tinged mucus that is entirely submitted in one block.  Volume: 0.8 x 0.7 x 0.2 cm (1 B)  SW 06/20/2019    Final Diagnosis performed by Jaquita Folds, MD.   Electronically signed 06/23/2019 Technical component performed at [employer]. [facility], [place] [address] [place], [place] [address].  Professional component performed at [facility], [place] [address] [place], [place] [address].  Immunohistochemistry Technical component (if applicable) was performed at [facility]. [address] [place], , [place] [address].   IMMUNOHISTOCHEMISTRY DISCLAIMER (if applicable): Some of these immuno histochemical stains may have been developed and the performance characteristics determine by [facility]. Some may not have been cleared or approved by the U.S. Food and Drug Administration. The FDA has determined that such clearance or approval is not necessary. This test is used for clinical purposes. It should not be regarded as investigational or for research. This laboratory is certified under the [place] (CLIA-88) as qualified to perform high complexity clinical laboratory testing.  The controls stained appropriately.       Assessment & Plan:   Problem List Items Addressed This Visit    None    Visit Diagnoses    Fever, unspecified fever cause    -  Primary   Concern that this is due to her cellulitis. Recheck with surgeon tomorrow. If not much better by Wednesday- come in for labs. Call w/ any concerns or not better   Relevant Orders   CBC with Differential/Platelet   Comprehensive metabolic panel   UA/M w/rflx Culture, Routine       Follow up plan: Return if symptoms worsen or fail to improve.   . This visit was completed via telephone due to the restrictions of the COVID-19 pandemic. All issues as above were discussed and addressed but no physical exam was performed. If it was felt that the patient should be evaluated in the office, they were directed there. The patient verbally consented to this visit. Patient was unable to complete an audio/visual visit due to Lack of equipment. Due to the catastrophic nature of the COVID-19 pandemic, this visit was done through audio contact only. . Location of the patient: home . Location of the  provider: work . Those involved with this call:  . Provider: Park Liter, DO . CMA: Lesle Chris, [place] . Front Desk/Registration: Don Perking  . Time spent on call: 15 minutes on the phone discussing health concerns. 23 minutes total spent in review of patient's record and preparation of their chart.

## 2019-07-08 ENCOUNTER — Other Ambulatory Visit: Payer: 59

## 2019-07-08 ENCOUNTER — Telehealth: Payer: Self-pay | Admitting: Obstetrics and Gynecology

## 2019-07-08 NOTE — Telephone Encounter (Signed)
LM for patient to return call.

## 2019-07-08 NOTE — Telephone Encounter (Signed)
Pt called in and stated that  This morning she is bleeding and having some cramps  .  The pt is a little worried about it. The pt is requesting a call back. Please advise

## 2019-07-08 NOTE — Telephone Encounter (Signed)
Pt called back. I called back to your desk. The pt is requesting a call back. Please advise

## 2019-07-09 NOTE — Telephone Encounter (Signed)
Patient is concerns because she has had break through bleeding a couple times the last three months. She would like to discuss having a hysterectomy. I have scheduled her to come in to discuss.

## 2019-07-11 ENCOUNTER — Other Ambulatory Visit: Payer: 59

## 2019-07-16 ENCOUNTER — Encounter: Payer: 59 | Admitting: Certified Nurse Midwife

## 2019-07-17 NOTE — Telephone Encounter (Signed)
error 

## 2019-07-21 ENCOUNTER — Other Ambulatory Visit: Payer: Self-pay | Admitting: Family Medicine

## 2019-07-21 DIAGNOSIS — Z1231 Encounter for screening mammogram for malignant neoplasm of breast: Secondary | ICD-10-CM

## 2019-07-22 ENCOUNTER — Ambulatory Visit
Admission: RE | Admit: 2019-07-22 | Discharge: 2019-07-22 | Disposition: A | Discharge status: Home / Self Care | Payer: 59 | Source: Ambulatory Visit | Attending: Family Medicine | Admitting: Family Medicine

## 2019-07-22 DIAGNOSIS — Z1231 Encounter for screening mammogram for malignant neoplasm of breast: Secondary | ICD-10-CM

## 2019-07-28 ENCOUNTER — Other Ambulatory Visit: Payer: Self-pay | Admitting: Surgery

## 2019-07-28 DIAGNOSIS — M7541 Impingement syndrome of right shoulder: Secondary | ICD-10-CM | POA: Diagnosis not present

## 2019-07-28 DIAGNOSIS — I712 Thoracic aortic aneurysm, without rupture, unspecified: Secondary | ICD-10-CM

## 2019-07-28 DIAGNOSIS — I7121 Aneurysm of the ascending aorta, without rupture: Secondary | ICD-10-CM

## 2019-07-29 ENCOUNTER — Ambulatory Visit: Payer: 59 | Admitting: Obstetrics and Gynecology

## 2019-08-27 ENCOUNTER — Other Ambulatory Visit: Payer: Self-pay

## 2019-08-27 ENCOUNTER — Other Ambulatory Visit: Payer: 59

## 2019-08-27 ENCOUNTER — Telehealth (INDEPENDENT_AMBULATORY_CARE_PROVIDER_SITE_OTHER): Payer: 59 | Admitting: Surgery

## 2019-08-27 DIAGNOSIS — I712 Thoracic aortic aneurysm, without rupture: Secondary | ICD-10-CM

## 2019-08-28 ENCOUNTER — Encounter: Payer: Self-pay | Admitting: Surgery

## 2019-08-28 NOTE — Progress Notes (Signed)
Patient ID: Brittany Ochoa, female   DOB: June 26, 1965, 54 y.o.   MRN: [id number]      [address]       [place] [address]             [phone]     CARDIOTHORACIC SURGERY TELEPHONE VIRTUAL OFFICE NOTE  Referring Provider is Early, Arvilla Meres, MD Primary Cardiologist is Kathlyn Sacramento, MD PCP is [name], DO   HPI:  I spoke with Brittany Ochoa (DOB 03-Nov-1965 ) via telephone on 08/27/2019 at 12:15 PM and verified that I was speaking with the correct person using more than one form of identification.  We discussed the reason(s) for conducting our visit virtually instead of in-person.  The patient expressed understanding the circumstances and agreed to proceed as described.   The patient is a 54 year old woman who is a radiation therapist at [employer] and presented on 04/30/2017 with complaints of chest pain radiating into her neck and down her left arm with numbness in left arm and palpitations. She ruled out for myocardial infarction and electrocardiogram was unremarkable. She had a CT of the chest and neck which showed a 4 cm fusiform ascending aortic aneurysm. She was also evaluated by Dr. Fran Lowes 06/29/2017 and had an echocardiogram showing normal left ventricular systolic function with ejection fraction of 60-65%. The aortic valve was trileaflet with normal leaflets and no restriction of mobility. There is no stenosis or regurgitation. The ascending aorta was measured at 39 mm.  I saw her on July 11, 2017 and recommended follow-up in 1 year with a CTA of the chest.  I did a virtual visit with her on 09/06/2018 and CTA of the chest at that time showed a stable fusiform ascending aortic aneurysm with a diameter of 4.1 x 4.0 cm.  Since I last talked to her she has been doing well.  She said that she did contract COVID-19 in December 2020 but did not require hospitalization and recovered without incident.  She mostly had upper respiratory  symptoms.  She has not been vaccinated since and has no plans to do so.   Current Outpatient Medications  Medication Sig Dispense Refill  . albuterol (PROVENTIL HFA;VENTOLIN HFA) 108 (90 Base) MCG/ACT inhaler Inhale 2 puffs into the lungs every 4 (four) hours as needed. 1 Inhaler 0  . escitalopram (LEXAPRO) 5 MG tablet Take 1 tablet (5 mg total) by mouth daily. 30 tablet 1  . fluticasone (FLONASE) 50 MCG/ACT nasal spray Place 2 sprays into both nostrils daily. (Patient taking differently: Place 2 sprays into both nostrils daily as needed. ) 16 g 6  . losartan (COZAAR) 50 MG tablet Take 1 tablet (50 mg total) by mouth daily. 90 tablet 3   No current facility-administered medications for this visit.     Diagnostic Tests:  CLINICAL DATA:  Thoracic aortic aneurysm.  EXAM: CT ANGIOGRAPHY CHEST WITH CONTRAST  TECHNIQUE: Multidetector CT imaging of the chest was performed using the standard protocol during bolus administration of intravenous contrast. Multiplanar CT image reconstructions and MIPs were obtained to evaluate the vascular anatomy.  CONTRAST:  123mL OMNIPAQUE IOHEXOL 350 MG/ML SOLN  COMPARISON:  July 03, 2018.  FINDINGS: Cardiovascular: 4.0 cm stable ascending thoracic aortic aneurysm is noted. Transverse aortic arch measures 2.5 cm. Proximal descending thoracic aorta measures 2.2 cm. No dissection is noted. Great vessels are widely patent without significant stenosis. Normal cardiac size. No pericardial effusion.  Mediastinum/Nodes: No enlarged mediastinal, hilar, or axillary  lymph nodes. 1.3 cm right thyroid nodule is noted. Trachea and esophagus demonstrate no significant findings.  Lungs/Pleura: No pneumothorax or pleural effusion is noted. Probable minimal subsegmental atelectasis is noted in the left lower lobe and inferior portion of right middle lobe. Stable 2 mm nodule is noted posteriorly in the right upper lobe best seen on image number 61  of series 47 which was stable on prior exam and can be considered benign at this point.  Upper Abdomen: No acute abnormality.  Musculoskeletal: No chest wall abnormality. No acute or significant osseous findings.  Review of the MIP images confirms the above findings.  IMPRESSION: Grossly stable 4 cm ascending thoracic aortic aneurysm. Recommend annual imaging followup by CTA or MRA. This recommendation follows 2010 ACCF/AHA/AATS/ACR/ASA/SCA/SCAI/SIR/STS/SVM Guidelines for the Diagnosis and Management of Patients with Thoracic Aortic Disease. Circulation. 2010; [id number]. Aortic aneurysm NOS (ICD10-I71.9).  Stable 2 mm nodule seen in right upper lobe which can be considered benign at this point with no further follow-up required.   Electronically Signed   By: Marijo Conception M.D.   On: 06/27/2019 08:59    Impression:  She has a stable 4.0 cm fusiform ascending aortic aneurysm.  This is well below the surgical threshold of 5.5 cm.  I stressed the importance of continued good blood pressure control and preventing further enlargement and acute aortic dissection.  I recommended following this up again in 1 year and if it is remaining stable at that size we may be able to move follow-up to every other year.  All of her questions have been answered.  Plan:  She will return to see me in 1 year with a CTA of the chest.    I discussed limitations of evaluation and management via telephone.  The patient was advised to call back for repeat telephone consultation or to seek an in-person evaluation if questions arise or the patient's clinical condition changes in any significant manner.  I spent 10 minutes of non-face-to-face time during the conduct of this telephone virtual office consultation.    Gaye Pollack, MD 08/27/2019

## 2019-09-09 ENCOUNTER — Ambulatory Visit: Payer: 59 | Admitting: Certified Nurse Midwife

## 2019-09-09 ENCOUNTER — Other Ambulatory Visit: Payer: Self-pay

## 2019-09-09 ENCOUNTER — Other Ambulatory Visit: Payer: Self-pay | Admitting: Orthopedic Surgery

## 2019-09-09 DIAGNOSIS — M7541 Impingement syndrome of right shoulder: Secondary | ICD-10-CM | POA: Diagnosis not present

## 2019-09-09 DIAGNOSIS — R9389 Abnormal findings on diagnostic imaging of other specified body structures: Secondary | ICD-10-CM

## 2019-09-09 NOTE — Progress Notes (Signed)
No charge, pt was supposed to see Dr. Marcelline Mates and was scheduled incorrectly with me.    Philip Aspen, CNM

## 2019-09-18 ENCOUNTER — Ambulatory Visit (INDEPENDENT_AMBULATORY_CARE_PROVIDER_SITE_OTHER): Payer: 59 | Admitting: Obstetrics and Gynecology

## 2019-09-18 ENCOUNTER — Ambulatory Visit (INDEPENDENT_AMBULATORY_CARE_PROVIDER_SITE_OTHER): Payer: 59

## 2019-09-18 ENCOUNTER — Encounter: Payer: Self-pay | Admitting: Obstetrics and Gynecology

## 2019-09-18 ENCOUNTER — Other Ambulatory Visit: Payer: Self-pay | Admitting: Obstetrics and Gynecology

## 2019-09-18 ENCOUNTER — Other Ambulatory Visit: Payer: Self-pay

## 2019-09-18 VITALS — BP 123/77 | HR 74 | Ht 63.0 in | Wt 203.9 lb

## 2019-09-18 DIAGNOSIS — D252 Subserosal leiomyoma of uterus: Secondary | ICD-10-CM

## 2019-09-18 DIAGNOSIS — N951 Menopausal and female climacteric states: Secondary | ICD-10-CM

## 2019-09-18 DIAGNOSIS — N950 Postmenopausal bleeding: Secondary | ICD-10-CM

## 2019-09-18 DIAGNOSIS — D251 Intramural leiomyoma of uterus: Secondary | ICD-10-CM

## 2019-09-18 NOTE — Progress Notes (Signed)
Pt present for ultrasound results. Pt stated that she was doing well no problems.  

## 2019-09-18 NOTE — Progress Notes (Signed)
GYNECOLOGY PROGRESS NOTE  Subjective:    Patient ID: Brittany Ochoa, female    DOB: Sep 06, 1965, 54 y.o.   MRN: [id number]  HPI  Patient is a 54 y.o. G0P0 female who presents for follow up after ultrasound.  She was previously seen by Dr. Amalia Hailey for evaluation for post-menopausal bleeding with a mildly thickened endometrium (6.7 mm), referred from her PCP in February.      Brittany Ochoa notes that she has been menopausal since 07/2017. She began experiencing multiple episodes of bleeding starting in January 2021.  Bleeding episodes were noted from 1/5/-17 (also experienced COVID infection during this time), 3/23-3/26 (diagnosed with strep throat during this time), and 4/25-4/27 (reports sinus infection during this time).  She has not had an bleding in May.  Additonally she reports that her menopausal symptoms have worsen.  She experienced symptoms initiially just prior to menopause, but they resolved.  Since the onset of her bleeding, her symptoms have returned and are much worse. She is experiencing significant hot flushes, night sweats, severe mood swings, loss of focus and memory.  Also starting to note more leg cramps at night.   Lastly, she reports a history of fibroids since 2018. Wonders if these could be affecting her bleeding.   The following portions of the patient's history were reviewed and updated as appropriate:  She  has a past medical history of Ascending aortic aneurysm ([place]), Dyspnea on exertion, Multiple thyroid nodules, and Reflux esophagitis.   She  has a past surgical history that includes Breast reduction surgery (1998); lipoma removal (2007); Esophagogastroduodenoscopy (egd) with propofol (N/A, 01/24/2018); and Reduction mammaplasty (Bilateral, 12/01/1996).   Her family history includes Breast cancer in her paternal grandmother; Cancer in her father, paternal grandfather, and paternal grandmother; Diabetes in her father and mother; Heart attack in her father; Heart disease in  her father, paternal grandfather, and paternal grandmother; Osteoporosis in her maternal grandmother.   She  reports that she quit smoking about 15 years ago. She has a 75.00 pack-year smoking history. She has never used smokeless tobacco. She reports current alcohol use. She reports that she does not use drugs.   Current Outpatient Medications on File Prior to Visit  Medication Sig Dispense Refill  . albuterol (PROVENTIL HFA;VENTOLIN HFA) 108 (90 Base) MCG/ACT inhaler Inhale 2 puffs into the lungs every 4 (four) hours as needed. 1 Inhaler 0  . fluticasone (FLONASE) 50 MCG/ACT nasal spray Place 2 sprays into both nostrils daily. (Patient taking differently: Place 2 sprays into both nostrils daily as needed. ) 16 g 6   No current facility-administered medications on file prior to visit.   She is allergic to codeine and medical provider ez flu shot [influenza vac split quad]..  Review of Systems Pertinent items noted in HPI and remainder of comprehensive ROS otherwise negative.   Objective:   Blood pressure 123/77, pulse 74, height 5\' 3"  (1.6 m), weight 203 lb 14.4 oz (92.5 kg). General appearance: alert and no distress Exam deferred today.     Labs:  Lab Results  Component Value Date   TSH 0.739 05/27/2019    Results for TRELLA, MAHANNAH (MRN [id number]) as of 09/19/2019 21:32  Ref. Range 05/27/2019 16:50  LH Latest Units: mIU/mL 35.6  FSH Latest Units: mIU/mL 82.4  Estradiol Latest Units: pg/mL <5.0  Sex Horm Binding Glob, Serum Latest Ref Range: 17.3 - 125.0 nmol/L 42.2  Testosterone Latest Ref Range: 3 - 41 ng/dL 3  Testosterone Free Latest Ref Range:  0.0 - 4.2 pg/mL 0.7     Pathology (06/19/2019):  FINAL MICROSCOPIC DIAGNOSIS:   A. ENDOMETRIUM, BIOPSY:  - Benign inactive endometrium  - Negative for hyperplasia or malignancy   Imaging:  CLINICAL DATA:  Postmenopausal bleeding   EXAM: 2/16//2021 TRANSABDOMINAL AND TRANSVAGINAL ULTRASOUND OF PELVIS   TECHNIQUE: Both  transabdominal and transvaginal ultrasound examinations of the pelvis were performed. Transabdominal technique was performed for global imaging of the pelvis including uterus, ovaries, adnexal regions, and pelvic cul-de-sac. It was necessary to proceed with endovaginal exam following the transabdominal exam to visualize the uterus endometrium ovaries.   COMPARISON:  None   FINDINGS: Uterus   Measurements: 8 x 4.7 x 5.1 cm = volume: 99.4 mL. Multiple uterine masses. Heterogenous slightly echogenic mass at the posterior uterine fundus measuring 3 x 3 x 2 cm. Fundal myometrial hypoechoic mass measuring 1.3 x 1 x 1.5 cm. Exophytic left fundal mass measuring 2.8 x 1.9 x 2.8 cm.   Endometrium   Thickness: 6.7 mm.  No focal abnormality visualized.   Right ovary   Measurements: 1.7 x 0.7 x 2.8 cm = volume: 1.7 mL. Normal appearance/no adnexal mass.   Left ovary   Measurements: 1.9 x 0.8 x 2.5 cm = volume: 2.1 mL. Normal appearance/no adnexal mass.   Other findings   No abnormal free fluid.   IMPRESSION: 1. Endometrial thickness of 6.7 mm. In the setting of post-menopausal bleeding, endometrial sampling is indicated to exclude carcinoma. If results are benign, sonohysterogram should be considered for focal lesion work-up. (Ref: Radiological Reasoning: Algorithmic Workup of Abnormal Vaginal Bleeding with Endovaginal Sonography and Sonohysterography. AJR 2008; [id number]) 2. Multiple uterine masses, consistent with fibroids.     Electronically Signed   By: Donavan Foil M.D.   On: 06/03/2019 15:13   Patient Name: Brittany Ochoa DOB: 1966/01/23 MRN: [id number]    ULTRASOUND REPORT   Location: [facility]  Date of Service: 09/18/2019        Indications:Abnormal Uterine Bleeding Findings:  The uterus is anteverted and measures 7.3 x 4.4 x 5.5 cm. Echo texture is heterogenous with evidence of focal masses. Within the uterus are multiple suspected  fibroids measuring: Fibroid 1:28.3 x 21.1 x 26.6 mm pedunculated anterior left Fibroid 2:24.8 x 17.4 x 16.2 mm intramural posterior right Fibroid 3: 27.1 x 19.4 x 23.2 mm intramural anterior rght   The Endometrium measures 4.3 mm.   Right Ovary measures 2.2 x 0.8 x 1.6 cm cm. It is normal in appearance. Left Ovary measures 2.4 x 1.0 x 1.3 cm. It is normal in appearance. Survey of the adnexa demonstrates no adnexal masses. There is a hypoechoic mass with blood flow to the left of the vaginal canal measuring 15 x 15 x 21 mm. This may be a fibroid vs. Other.  There is no free fluid in the cul de sac.   Impression: 1. The uterus is very heterogeneous with multiple uterine fibroids to numerous to count. Some are ill-defined.  2. The fibroids that were the most well defined were measured.   3. There is a hypoechoic mass to the left of the vaginal canal.  4. Normal appearing ovaries.  5. The endometrial lining is fairly thin.    Recommendations: 1.Clinical correlation with the patient's History and Physical Exam.    Gweneth Dimitri, RT  Assessment:   Postmenopausal bleeding Vasomotor symptoms Fibroid uterus  Plan:   1.  Postmenopausal bleeding -Reviewed etiologies of postmenopausal bleeding, concern about precancerous/hyperplasia or  cancerous etiology (5 to 10% percent of cases). Also discussed role of unopposed estrogen exposure in leading to thickened or proliferative endometrium; and its possible correlation with endometrial hyperplasia/carcinoma.  However, she was reassured that endometrial atrophy and endometrial polyps are the most common causes of postmenopausal bleeding.  Uterine bleeding in postmenopausal women is usually light and self-limited.  She has had an endometrial biopsy noting benign inactive tissue, as well as today's ultrasound noting return of of endometrial thickness to normal (4 mm).  Discussed that bleeding could have occurred when her immune system was  depressed, as she was noted to have ailments during each of the episodes of bleeding.   Has had no further bleeding since this time. No further workup or treatment indicated at his time.   2. Vasomotor symptoms - Patient with bothersome menopausal vasomotor symptoms. Discussed lifestyle interventions such as wearing light clothing, remaining in cool environments, having fan/air conditioner in the room, avoiding hot beverages etc.  Discussed using hormone therapy and concerns about increased risk of heart disease, cerebrovascular disease, thromboembolic disease,  and breast cancer.  Also discussed other medical options such as Paxil, Effexor, Brisdelle, Clonidine,  or Neurontin.   Additionally discussed alternative therapies such as herbal remedies but cautioned that most of the products contained phytoestrogens (plant estrogens) in unregulated amounts which can have the same effects on the body as the pharmaceutical estrogen preparations.  Lastly, referred her to www.[email or web address].org for other alternative options.  Patient desires to think over her options. Given handouts. To notify office if decision is made for prescription medication.   3. Uterine fibroids - overall stable. I do not believe these to be the cause of her bleeding at this time.  Given reassurance.     A total of 30 minutes were spent face-to-face with the patient during this encounter and over half of that time involved counseling and coordination of care.   Rubie Maid, MD [facility]

## 2019-09-19 ENCOUNTER — Ambulatory Visit: Payer: 59 | Admitting: Nurse Practitioner

## 2019-09-19 ENCOUNTER — Ambulatory Visit: Payer: 59 | Admitting: Family Medicine

## 2019-09-26 ENCOUNTER — Encounter: Payer: Self-pay | Admitting: Family Medicine

## 2019-09-26 ENCOUNTER — Other Ambulatory Visit: Payer: Self-pay

## 2019-09-26 ENCOUNTER — Ambulatory Visit (INDEPENDENT_AMBULATORY_CARE_PROVIDER_SITE_OTHER): Payer: 59 | Admitting: Family Medicine

## 2019-09-26 VITALS — BP 135/95 | HR 76 | Temp 97.6°F | Ht 64.06 in | Wt 199.0 lb

## 2019-09-26 DIAGNOSIS — N61 Mastitis without abscess: Secondary | ICD-10-CM

## 2019-09-26 DIAGNOSIS — M359 Systemic involvement of connective tissue, unspecified: Secondary | ICD-10-CM | POA: Diagnosis not present

## 2019-09-26 DIAGNOSIS — Z8616 Personal history of COVID-19: Secondary | ICD-10-CM

## 2019-09-26 DIAGNOSIS — R252 Cramp and spasm: Secondary | ICD-10-CM

## 2019-09-26 DIAGNOSIS — Z0000 Encounter for general adult medical examination without abnormal findings: Secondary | ICD-10-CM | POA: Diagnosis not present

## 2019-09-26 LAB — URINALYSIS, ROUTINE W REFLEX MICROSCOPIC
Bilirubin, UA: NEGATIVE
Glucose, UA: NEGATIVE
Ketones, UA: NEGATIVE
Leukocytes,UA: NEGATIVE
Nitrite, UA: NEGATIVE
Protein,UA: NEGATIVE
RBC, UA: NEGATIVE
Specific Gravity, UA: 1.015 (ref 1.005–1.030)
Urobilinogen, Ur: 0.2 mg/dL (ref 0.2–1.0)
pH, UA: 7 (ref 5.0–7.5)

## 2019-09-26 MED ORDER — SULFAMETHOXAZOLE-TRIMETHOPRIM 800-160 MG PO TABS: 1.0000 | ORAL_TABLET | Freq: Two times a day (BID) | ORAL | 0 refills | Status: DC

## 2019-09-26 NOTE — Progress Notes (Signed)
BP (!) 135/95   Pulse 76   Temp 97.6 F (36.4 C)   Ht 5' 4.06" (1.627 m)   Wt 199 lb (90.3 kg)   LMP  (LMP Unknown)   SpO2 94%   BMI 34.10 kg/m    Subjective:    Patient ID: Brittany Ochoa, female    DOB: [birth year], 54 y.o.   MRN: [id number]  HPI: Brittany Ochoa is a 54 y.o. female presenting on 09/26/2019 for comprehensive medical examination. Current medical complaints include:  Has had a spot on her R breast for about a week or so. Has been growing. Aching a little bit, feeling better than yesterday.   EAG CLOGGED Duration: 1.5 weeks Involved ear(s):  bilateral Sensation of feeling clogged/plugged: yes Decreased/muffled hearing:no Ear pain: no Fever: no Otorrhea: no Hearing loss: yes Upper respiratory infection symptoms: no Using Q-Tips: no Status: worse History of cerumenosis: no Treatments attempted: none  LEG CRAMPS Duration: months Pain: yes Severity: severe  Quality:  Sharp and crampy Location:  calves Bilateral:  yes Onset: sudden Frequency: at night almost every night Time of  day:   night time Sudden unintentional leg jerking:   no Paresthesias:   yes Decreased sensation:  no Weakness:   no Insomnia:   no Fatigue:   yes Alleviating factors:  Aggravating factors: Status: worse  She currently lives with: alone Menopausal Symptoms: yes  Depression Screen done today and results listed below:  Depression screen [facility] 2/9 01/14/2019 10/04/2018 10/04/2018 01/10/2018 09/28/2017  Decreased Interest 3 1 1  0 1  Down, Depressed, Hopeless 3 1 1  0 1  PHQ - 2 Score 6 2 2  0 2  Altered sleeping 0 2 2 2 2   Tired, decreased energy 3 2 2 2 2   Change in appetite 3 3 3 3 2   Feeling bad or failure about yourself  0 3 3 0 1  Trouble concentrating 0 1 1 0 2  Moving slowly or fidgety/restless 0 0 0 0 0  Suicidal thoughts 1 (No Data) 0 0 0  PHQ-9 Score 13 13 13 7 11   Difficult doing work/chores Not difficult at all - Very difficult Not difficult at all -     Past Medical History:  Past Medical History:  Diagnosis Date  . Allergy 2015   seasonal  . Ascending aortic aneurysm ([place])    a. 06/2018 CTA Chest: 4.1 x 4.0 cm fusiform Asc Ao aneurysm-unchanged.  Descending thoracic aorta 2.1 cm.  . Asthma    exercised induced  . Dyspnea on exertion    a. 06/2017 echo: EF 60 to 65%.  No regional wall motion abnormalities.  Ascending aortic diameter 3.9 cm.  Mild MR.  . Essential hypertension   . Multiple thyroid nodules   . OCD (obsessive compulsive disorder)   . Reflux esophagitis     Surgical History:  Past Surgical History:  Procedure Laterality Date  . BREAST REDUCTION SURGERY  1998  . COSMETIC SURGERY  1997   breast reduction  . ESOPHAGOGASTRODUODENOSCOPY (EGD) WITH PROPOFOL N/A 01/24/2018   Procedure: ESOPHAGOGASTRODUODENOSCOPY (EGD) WITH PROPOFOL;  Surgeon: [name], MD;  Location: [facility] ENDOSCOPY;  Service: Endoscopy;  Laterality: N/A;  . lipoma removal  2007   back  . REDUCTION MAMMAPLASTY Bilateral 12/01/1996    Medications:  Current Outpatient Medications on File Prior to Visit  Medication Sig  . albuterol (PROVENTIL HFA;VENTOLIN HFA) 108 (90 Base) MCG/ACT inhaler Inhale 2 puffs into the lungs every 4 (four) hours as needed.  Marland Kitchen  fluticasone (FLONASE) 50 MCG/ACT nasal spray Place 2 sprays into both nostrils daily. (Patient taking differently: Place 2 sprays into both nostrils daily as needed. )   No current facility-administered medications on file prior to visit.    Allergies:  Allergies  Allergen Reactions  . Codeine   . Medical Provider Ez Flu Shot [Influenza Vac Split Quad] Other (See Comments)    Severe myalgias and bone pain    Social History:  Social History   Socioeconomic History  . Marital status: Single    Spouse name: Not on file  . Number of children: Not on file  . Years of education: Not on file  . Highest education level: Not on file  Occupational History  . Not on file  Tobacco Use   . Smoking status: Former Smoker    Packs/day: 1.00    Years: 25.00    Pack years: 25.00    Types: Cigarettes    Quit date: 04/17/2004    Years since quitting: 15.4  . Smokeless tobacco: Never Used  Vaping Use  . Vaping Use: Never used  Substance and Sexual Activity  . Alcohol use: Yes    Alcohol/week: 0.0 standard drinks    Comment: socially  . Drug use: No    Comment: past only  . Sexual activity: Not Currently  Other Topics Concern  . Not on file  Social History Narrative  . Not on file   Social Determinants of Health   Financial Resource Strain:   . Difficulty of Paying Living Expenses:   Food Insecurity:   . Worried About Charity fundraiser in the Last Year:   . Arboriculturist in the Last Year:   Transportation Needs:   . Film/video editor (Medical):   Marland Kitchen Lack of Transportation (Non-Medical):   Physical Activity:   . Days of Exercise per Week:   . Minutes of Exercise per Session:   Stress:   . Feeling of Stress :   Social Connections:   . Frequency of Communication with Friends and Family:   . Frequency of Social Gatherings with Friends and Family:   . Attends Religious Services:   . Active Member of Clubs or Organizations:   . Attends Archivist Meetings:   Marland Kitchen Marital Status:   Intimate Partner Violence:   . Fear of Current or Ex-Partner:   . Emotionally Abused:   Marland Kitchen Physically Abused:   . Sexually Abused:    Social History   Tobacco Use  Smoking Status Former Smoker  . Packs/day: 1.00  . Years: 25.00  . Pack years: 25.00  . Types: Cigarettes  . Quit date: 04/17/2004  . Years since quitting: 15.4  Smokeless Tobacco Never Used   Social History   Substance and Sexual Activity  Alcohol Use Yes  . Alcohol/week: 0.0 standard drinks   Comment: socially    Family History:  Family History  Problem Relation Age of Onset  . Diabetes Mother   . Arthritis Mother   . Obesity Mother   . Diabetes Father   . Heart disease Father   . Heart  attack Father   . Cancer Father        bone  . Heart disease Paternal Grandmother   . Cancer Paternal Grandmother        bone  . Breast cancer Paternal Grandmother   . Cancer Paternal Grandfather        pancreatic?  Marland Kitchen Heart disease Paternal Grandfather   .  Osteoporosis Maternal Grandmother   . Arthritis Maternal Grandmother   . Varicose Veins Maternal Grandmother   . Alcohol abuse Maternal Uncle   . Asthma Brother   . Cancer Paternal Aunt   . Diabetes Paternal Aunt   . Cancer Paternal Uncle     Past medical history, surgical history, medications, allergies, family history and social history reviewed with patient today and changes made to appropriate areas of the chart.   Review of Systems  Constitutional: Positive for diaphoresis. Negative for chills, fever, malaise/fatigue and weight loss.  HENT: Positive for hearing loss. Negative for congestion, ear discharge, ear pain, nosebleeds, sinus pain, sore throat and tinnitus.   Eyes: Negative.   Respiratory: Positive for shortness of breath. Negative for cough, hemoptysis, sputum production, wheezing and stridor.   Cardiovascular: Positive for palpitations. Negative for chest pain, orthopnea, claudication, leg swelling and PND.  Gastrointestinal: Negative.   Genitourinary: Negative.        +nocturia   Musculoskeletal: Positive for myalgias. Negative for back pain, falls, joint pain and neck pain.  Skin: Negative.   Neurological: Positive for tingling. Negative for dizziness, tremors, sensory change, speech change, focal weakness, seizures, loss of consciousness, weakness and headaches.  Endo/Heme/Allergies: Positive for environmental allergies. Negative for polydipsia. Does not bruise/bleed easily.  Psychiatric/Behavioral: Negative for depression, hallucinations, memory loss, substance abuse and suicidal ideas. The patient is nervous/anxious. The patient does not have insomnia.     All other ROS negative except what is listed above  and in the HPI.      Objective:    BP (!) 135/95   Pulse 76   Temp 97.6 F (36.4 C)   Ht 5' 4.06" (1.627 m)   Wt 199 lb (90.3 kg)   LMP  (LMP Unknown)   SpO2 94%   BMI 34.10 kg/m   Wt Readings from Last 3 Encounters:  09/26/19 199 lb (90.3 kg)  09/18/19 203 lb 14.4 oz (92.5 kg)  07/07/19 186 lb (84.4 kg)    Physical Exam Vitals and nursing note reviewed.  Constitutional:      General: She is not in acute distress.    Appearance: Normal appearance. She is not ill-appearing, toxic-appearing or diaphoretic.  HENT:     Head: Normocephalic and atraumatic.     Right Ear: Tympanic membrane, ear canal and external ear normal. There is no impacted cerumen.     Left Ear: Tympanic membrane, ear canal and external ear normal. There is no impacted cerumen.     Nose: Nose normal. No congestion or rhinorrhea.     Mouth/Throat:     Mouth: Mucous membranes are moist.     Pharynx: Oropharynx is clear. No oropharyngeal exudate or posterior oropharyngeal erythema.  Eyes:     General: No scleral icterus.       Right eye: No discharge.        Left eye: No discharge.     Extraocular Movements: Extraocular movements intact.     Conjunctiva/sclera: Conjunctivae normal.     Pupils: Pupils are equal, round, and reactive to light.  Neck:     Vascular: No carotid bruit.  Cardiovascular:     Rate and Rhythm: Normal rate and regular rhythm.     Pulses: Normal pulses.     Heart sounds: No murmur heard.  No friction rub. No gallop.   Pulmonary:     Effort: Pulmonary effort is normal. No respiratory distress.     Breath sounds: Normal breath sounds. No stridor. No wheezing,  rhonchi or rales.  Chest:     Chest wall: No tenderness.    Abdominal:     General: Abdomen is flat. Bowel sounds are normal. There is no distension.     Palpations: Abdomen is soft. There is no mass.     Tenderness: There is no abdominal tenderness. There is no right CVA tenderness, left CVA tenderness, guarding or  rebound.     Hernia: No hernia is present.  Genitourinary:    Comments: Breast and pelvic exams deferred with shared decision making Musculoskeletal:        General: No swelling, tenderness, deformity or signs of injury.     Cervical back: Normal range of motion and neck supple. No rigidity. No muscular tenderness.     Right lower leg: No edema.     Left lower leg: No edema.  Lymphadenopathy:     Cervical: No cervical adenopathy.  Skin:    General: Skin is warm and dry.     Capillary Refill: Capillary refill takes less than 2 seconds.     Coloration: Skin is not jaundiced or pale.     Findings: No bruising, erythema, lesion or rash.  Neurological:     General: No focal deficit present.     Mental Status: She is alert and oriented to person, place, and time. Mental status is at baseline.     Cranial Nerves: No cranial nerve deficit.     Sensory: No sensory deficit.     Motor: No weakness.     Coordination: Coordination normal.     Gait: Gait normal.     Deep Tendon Reflexes: Reflexes normal.  Psychiatric:        Mood and Affect: Mood normal.        Behavior: Behavior normal.        Thought Content: Thought content normal.        Judgment: Judgment normal.     Results for orders placed or performed in visit on 06/19/19  Surgical pathology  Result Value Ref Range   SURGICAL PATHOLOGY      SURGICAL PATHOLOGY CASE: [id number] PATIENT: [place] [facility] Report     Clinical History: thickened endometrium (cm)     FINAL MICROSCOPIC DIAGNOSIS:  A. ENDOMETRIUM, BIOPSY: - Benign inactive endometrium - Negative for hyperplasia or malignancy     GROSS DESCRIPTION:  Received in formalin is blood tinged mucus that is entirely submitted in one block.  Volume: 0.8 x 0.7 x 0.2 cm (1 B)  SW 06/20/2019    Final Diagnosis performed by Jaquita Folds, MD.   Electronically signed 06/23/2019 Technical component performed at [employer]. [facility],  [place] [address] [place], [place] [address].  Professional component performed at [facility], [place] [address] [place], [place] [address].  Immunohistochemistry Technical component (if applicable) was performed at [facility], [place]. [address] [place], [place], [place] [address].   IMMUNOHISTOCHEMISTRY DISCLAIMER (if applicable): Some of these immuno histochemical stains may have been developed and the performance characteristics determine by [facility]. Some may not have been cleared or approved by the U.S. Food and Drug Administration. The FDA has determined that such clearance or approval is not necessary. This test is used for clinical purposes. It should not be regarded as investigational or for research. This laboratory is certified under the [place] (CLIA-88) as qualified to perform high complexity clinical laboratory testing.  The controls stained appropriately.  Assessment & Plan:   Problem List Items Addressed This Visit      Other   Autoimmune disease ([place])    Will check labs today. Call with any concerns.       Relevant Orders   Antinuclear Antib (ANA)    Other Visit Diagnoses    Routine general medical examination at a health care facility    -  Primary   Vaccines up to date/declined. Screening labs checked today. Pap to be done with GYN. Mammogram up to date. Do cologuard. Continue diet and exercise.    Relevant Orders   CBC with Differential/Platelet   Comprehensive metabolic panel   Lipid Panel w/o Chol/HDL Ratio   TSH   Leg cramps       Will check labs. Await results. Call with any concerns. Continue to monitor.    Relevant Orders   CBC with Differential/Platelet   Comprehensive metabolic panel   Magnesium   Phosphorus   Lipid Panel w/o Chol/HDL Ratio   TSH   VITAMIN D 25 Hydroxy (Vit-D Deficiency, Fractures)   HIV Antibody (routine testing w rflx)    Urinalysis, Routine w reflex microscopic   History of COVID-19       Would like antibodies checked. Await results.    Relevant Orders   SAR CoV2 Serology (COVID 19)AB(IGG)IA   Cellulitis of breast       Will treat with bactrim. Call with any concerns. Await results. Call with any concerns.        Follow up plan: Return in about 1 year (around 09/25/2020) for Physical.   LABORATORY TESTING:  - Pap smear: done elsewhere  IMMUNIZATIONS:   - Tdap: Tetanus vaccination status reviewed: last tetanus booster within 10 years. - Influenza: allergic - Pneumovax: Not applicable  SCREENING: -Mammogram: Up to date  - Colonoscopy: cologuard ordered    PATIENT COUNSELING:   Advised to take 1 mg of folate supplement per day if capable of pregnancy.   Sexuality: Discussed sexually transmitted diseases, partner selection, use of condoms, avoidance of unintended pregnancy  and contraceptive alternatives.   Advised to avoid cigarette smoking.  I discussed with the patient that most people either abstain from alcohol or drink within safe limits (<=14/week and <=4 drinks/occasion for males, <=7/weeks and <= 3 drinks/occasion for females) and that the risk for alcohol disorders and other health effects rises proportionally with the number of drinks per week and how often a drinker exceeds daily limits.  Discussed cessation/primary prevention of drug use and availability of treatment for abuse.   Diet: Encouraged to adjust caloric intake to maintain  or achieve ideal body weight, to reduce intake of dietary saturated fat and total fat, to limit sodium intake by avoiding high sodium foods and not adding table salt, and to maintain adequate dietary potassium and calcium preferably from fresh fruits, vegetables, and low-fat dairy products.    stressed the importance of regular exercise  Injury prevention: Discussed safety belts, safety helmets, smoke detector, smoking near bedding or upholstery.    Dental health: Discussed importance of regular tooth brushing, flossing, and dental visits.    NEXT PREVENTATIVE PHYSICAL DUE IN 1 YEAR. Return in about 1 year (around 09/25/2020) for Physical.

## 2019-09-26 NOTE — Assessment & Plan Note (Signed)
Will check labs today. Call with any concerns.

## 2019-09-27 LAB — COMPREHENSIVE METABOLIC PANEL
ALT: 24 IU/L (ref 0–32)
AST: 25 IU/L (ref 0–40)
Albumin/Globulin Ratio: 2 (ref 1.2–2.2)
Albumin: 4.7 g/dL (ref 3.8–4.9)
Alkaline Phosphatase: 95 IU/L (ref 48–121)
BUN/Creatinine Ratio: 19 (ref 9–23)
BUN: 14 mg/dL (ref 6–24)
Bilirubin Total: 0.5 mg/dL (ref 0.0–1.2)
CO2: 26 mmol/L (ref 20–29)
Calcium: 10 mg/dL (ref 8.7–10.2)
Chloride: 99 mmol/L (ref 96–106)
Creatinine, Ser: 0.72 mg/dL (ref 0.57–1.00)
GFR calc Af Amer: 111 mL/min/{1.73_m2} (ref >59)
GFR calc non Af Amer: 97 mL/min/{1.73_m2} (ref >59)
Globulin, Total: 2.4 g/dL (ref 1.5–4.5)
Glucose: 99 mg/dL (ref 65–99)
Potassium: 4.7 mmol/L (ref 3.5–5.2)
Sodium: 144 mmol/L (ref 134–144)
Total Protein: 7.1 g/dL (ref 6.0–8.5)

## 2019-09-27 LAB — MAGNESIUM: Magnesium: 2.3 mg/dL (ref 1.6–2.3)

## 2019-09-27 LAB — CBC WITH DIFFERENTIAL/PLATELET
Basophils Absolute: 0.1 10*3/uL (ref 0.0–0.2)
Basos: 1 %
EOS (ABSOLUTE): 0.1 10*3/uL (ref 0.0–0.4)
Eos: 1 %
Hematocrit: 44 % (ref 34.0–46.6)
Hemoglobin: 14.7 g/dL (ref 11.1–15.9)
Immature Grans (Abs): 0 10*3/uL (ref 0.0–0.1)
Immature Granulocytes: 1 %
Lymphocytes Absolute: 1.7 10*3/uL (ref 0.7–3.1)
Lymphs: 19 %
MCH: 30.5 pg (ref 26.6–33.0)
MCHC: 33.4 g/dL (ref 31.5–35.7)
MCV: 91 fL (ref 79–97)
Monocytes Absolute: 0.4 10*3/uL (ref 0.1–0.9)
Monocytes: 5 %
Neutrophils Absolute: 6.4 10*3/uL (ref 1.4–7.0)
Neutrophils: 73 %
Platelets: 280 10*3/uL (ref 150–450)
RBC: 4.82 x10E6/uL (ref 3.77–5.28)
RDW: 13.1 % (ref 11.7–15.4)
WBC: 8.6 10*3/uL (ref 3.4–10.8)

## 2019-09-27 LAB — LIPID PANEL W/O CHOL/HDL RATIO
Cholesterol, Total: 223 mg/dL — ABNORMAL HIGH (ref 100–199)
HDL: 72 mg/dL (ref >39)
LDL Chol Calc (NIH): 133 mg/dL — ABNORMAL HIGH (ref 0–99)
Triglycerides: 103 mg/dL (ref 0–149)
VLDL Cholesterol Cal: 18 mg/dL (ref 5–40)

## 2019-09-27 LAB — TSH: TSH: 0.652 u[IU]/mL (ref 0.450–4.500)

## 2019-09-27 LAB — SAR COV2 SEROLOGY (COVID19)AB(IGG),IA: DiaSorin SARS-CoV-2 Ab, IgG: POSITIVE

## 2019-09-27 LAB — VITAMIN D 25 HYDROXY (VIT D DEFICIENCY, FRACTURES): Vit D, 25-Hydroxy: 19.2 ng/mL — ABNORMAL LOW (ref 30.0–100.0)

## 2019-09-27 LAB — HIV ANTIBODY (ROUTINE TESTING W REFLEX): HIV Screen 4th Generation wRfx: NONREACTIVE

## 2019-09-27 LAB — PHOSPHORUS: Phosphorus: 3.8 mg/dL (ref 3.0–4.3)

## 2019-09-27 LAB — ANA: Anti Nuclear Antibody (ANA): NEGATIVE

## 2019-09-28 DIAGNOSIS — R079 Chest pain, unspecified: Secondary | ICD-10-CM | POA: Diagnosis not present

## 2019-09-28 DIAGNOSIS — I10 Essential (primary) hypertension: Secondary | ICD-10-CM | POA: Diagnosis not present

## 2019-09-28 DIAGNOSIS — R0789 Other chest pain: Secondary | ICD-10-CM | POA: Diagnosis not present

## 2019-09-28 DIAGNOSIS — R0902 Hypoxemia: Secondary | ICD-10-CM | POA: Diagnosis not present

## 2019-09-28 DIAGNOSIS — R000 Tachycardia, unspecified: Secondary | ICD-10-CM | POA: Diagnosis not present

## 2019-09-29 ENCOUNTER — Encounter: Payer: Self-pay | Admitting: Emergency Medicine

## 2019-09-29 ENCOUNTER — Emergency Department: Payer: 59

## 2019-09-29 ENCOUNTER — Emergency Department
Admission: EM | Admit: 2019-09-29 | Discharge: 2019-09-29 | Disposition: A | Discharge status: Home / Self Care | Payer: 59 | Attending: Emergency Medicine | Admitting: Emergency Medicine

## 2019-09-29 ENCOUNTER — Other Ambulatory Visit: Payer: Self-pay | Admitting: Family Medicine

## 2019-09-29 ENCOUNTER — Other Ambulatory Visit: Payer: Self-pay

## 2019-09-29 DIAGNOSIS — R0600 Dyspnea, unspecified: Secondary | ICD-10-CM | POA: Diagnosis not present

## 2019-09-29 DIAGNOSIS — I10 Essential (primary) hypertension: Secondary | ICD-10-CM | POA: Insufficient documentation

## 2019-09-29 DIAGNOSIS — R079 Chest pain, unspecified: Secondary | ICD-10-CM

## 2019-09-29 DIAGNOSIS — R0789 Other chest pain: Secondary | ICD-10-CM | POA: Insufficient documentation

## 2019-09-29 DIAGNOSIS — R0602 Shortness of breath: Secondary | ICD-10-CM | POA: Diagnosis not present

## 2019-09-29 DIAGNOSIS — Z79899 Other long term (current) drug therapy: Secondary | ICD-10-CM | POA: Insufficient documentation

## 2019-09-29 DIAGNOSIS — J45909 Unspecified asthma, uncomplicated: Secondary | ICD-10-CM | POA: Diagnosis not present

## 2019-09-29 DIAGNOSIS — R110 Nausea: Secondary | ICD-10-CM | POA: Diagnosis not present

## 2019-09-29 DIAGNOSIS — I712 Thoracic aortic aneurysm, without rupture: Secondary | ICD-10-CM | POA: Diagnosis not present

## 2019-09-29 DIAGNOSIS — J984 Other disorders of lung: Secondary | ICD-10-CM | POA: Diagnosis not present

## 2019-09-29 LAB — CBC
HCT: 42.8 % (ref 36.0–46.0)
Hemoglobin: 14.3 g/dL (ref 12.0–15.0)
MCH: 31 pg (ref 26.0–34.0)
MCHC: 33.4 g/dL (ref 30.0–36.0)
MCV: 92.8 fL (ref 80.0–100.0)
Platelets: 229 10*3/uL (ref 150–400)
RBC: 4.61 MIL/uL (ref 3.87–5.11)
RDW: 13.2 % (ref 11.5–15.5)
WBC: 12.7 10*3/uL — ABNORMAL HIGH (ref 4.0–10.5)
nRBC: 0 % (ref 0.0–0.2)

## 2019-09-29 LAB — BASIC METABOLIC PANEL
Anion gap: 9 (ref 5–15)
BUN: 17 mg/dL (ref 6–20)
CO2: 28 mmol/L (ref 22–32)
Calcium: 9.4 mg/dL (ref 8.9–10.3)
Chloride: 105 mmol/L (ref 98–111)
Creatinine, Ser: 0.6 mg/dL (ref 0.44–1.00)
GFR calc Af Amer: >60 mL/min (ref >60)
GFR calc non Af Amer: >60 mL/min (ref >60)
Glucose, Bld: 96 mg/dL (ref 70–99)
Potassium: 4 mmol/L (ref 3.5–5.1)
Sodium: 142 mmol/L (ref 135–145)

## 2019-09-29 LAB — TROPONIN I (HIGH SENSITIVITY)
Troponin I (High Sensitivity): 7 ng/L (ref <18)
Troponin I (High Sensitivity): 11 ng/L (ref <18)

## 2019-09-29 MED ORDER — IOHEXOL 350 MG/ML SOLN
75.0000 mL | Freq: Once | INTRAVENOUS | Status: AC | PRN
  Administered 2019-09-29: 75 mL via INTRAVENOUS

## 2019-09-29 MED ORDER — SODIUM CHLORIDE 0.9% FLUSH: 3.0000 mL | Freq: Once | INTRAVENOUS | Status: DC

## 2019-09-29 MED ORDER — VITAMIN D (ERGOCALCIFEROL) 1.25 MG (50000 UNIT) PO CAPS: 50000.0000 [IU] | ORAL_CAPSULE | ORAL | 0 refills | Status: DC

## 2019-09-29 MED ORDER — ONDANSETRON 4 MG PO TBDP
4.0000 mg | ORAL_TABLET | Freq: Once | ORAL | Status: AC
  Administered 2019-09-29: 4 mg via ORAL

## 2019-09-29 MED ORDER — ONDANSETRON 4 MG PO TBDP
ORAL_TABLET | ORAL | Status: AC
  Filled 2019-09-29: qty 1

## 2019-09-29 NOTE — ED Triage Notes (Signed)
Patient woke up tonight "freezing". Patient states she could feel her heartbeat and it was 125. BP was okay. Called EMS and they obtained a blood pressure of 220/170. Patient is without obvious signs of shortness of breath. States nausea without vomiting.

## 2019-09-29 NOTE — ED Notes (Signed)
ED Provider at bedside. 

## 2019-09-29 NOTE — ED Notes (Signed)
Per EMS started antibiotics tonight and woke up tonight with chest pain. States that the pain is now creeping all over her body. On antibiotics for an insect bite to the side of the chest. Ems gave aspirin and zofran. 20g to the right hand. EKG shows ST. Is being watched for an AAA. Size is currently 4.76mm-4.2mm; is not a candidate for surgery until it reaches 4.56mm

## 2019-09-29 NOTE — ED Provider Notes (Signed)
[facility] Emergency Department Provider Note  ____________________________________________   First MD Initiated Contact with Patient 09/29/19 [phone]     (approximate)  I have reviewed the triage vital signs and the nursing notes.   HISTORY  Chief Complaint Chest Pain    HPI Brittany Ochoa is a 54 y.o. female with below list of previous medical conditions including hypertension and ascending aortic aneurysm 4 cm last noted on CT in March 2021 presents to the emergency department secondary to awakening this morning stating that she was "freezing with a rapid heartbeat with pulse oximeter reading 125.  EMS reported that her blood pressure was markedly elevated at 220/170.  Patient admits to dyspnea at the time none at present.  Patient admits to nausea but no vomiting.  On arrival to the emergency department.  On arrival to the emergency department patient's blood pressure 138/89 current blood pressure 114/84 no antihypertensives were administered.  Patient was given Zofran ODT 4 mg with resolution of nausea.  Patient does recall chest "pressure" at the time of the event.    Past Medical History:  Diagnosis Date  . Allergy 2015   seasonal  . Ascending aortic aneurysm ([place])    a. 06/2018 CTA Chest: 4.1 x 4.0 cm fusiform Asc Ao aneurysm-unchanged.  Descending thoracic aorta 2.1 cm.  . Asthma    exercised induced  . Dyspnea on exertion    a. 06/2017 echo: EF 60 to 65%.  No regional wall motion abnormalities.  Ascending aortic diameter 3.9 cm.  Mild MR.  . Essential hypertension   . Multiple thyroid nodules   . OCD (obsessive compulsive disorder)   . Reflux esophagitis     Patient Active Problem List   Diagnosis Date Noted  . Autoimmune disease ([place]) 05/27/2019  . Weight gain 01/14/2019  . Essential hypertension 12/16/2018  . Menopause 10/04/2018  . Esophageal dysphagia   . Stomach irritation   . OCD (obsessive compulsive disorder) 01/10/2018  .  Exercise-induced asthma 10/26/2017  . Multiple thyroid nodules 07/15/2017  . Mitral regurgitation 07/15/2017  . History of radiation exposure 07/15/2017  . Ascending aortic aneurysm ([place]) 05/04/2017  . 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 Pineal gland cyst 09/28/2016    Past Surgical History:  Procedure Laterality Date  . BREAST REDUCTION SURGERY  1998  . COSMETIC SURGERY  1997   breast reduction  . ESOPHAGOGASTRODUODENOSCOPY (EGD) WITH PROPOFOL N/A 01/24/2018   Procedure: ESOPHAGOGASTRODUODENOSCOPY (EGD) WITH PROPOFOL;  Surgeon: [name], MD;  Location: [facility] ENDOSCOPY;  Service: Endoscopy;  Laterality: N/A;  . lipoma removal  2007   back  . REDUCTION MAMMAPLASTY Bilateral 12/01/1996    Prior to Admission medications   Medication Sig Start Date End Date Taking? Authorizing Provider  albuterol (PROVENTIL HFA;VENTOLIN HFA) 108 (90 Base) MCG/ACT inhaler Inhale 2 puffs into the lungs every 4 (four) hours as needed. 07/22/18   Darlin Priestly, PA-C  fluticasone (FLONASE) 50 MCG/ACT nasal spray Place 2 sprays into both nostrils daily. Patient taking differently: Place 2 sprays into both nostrils daily as needed.  05/27/19   Johnson, Megan P, DO  sulfamethoxazole-trimethoprim (BACTRIM DS) 800-160 MG tablet Take 1 tablet by mouth 2 (two) times daily. 09/26/19   [name], DO    Allergies Codeine and Medical provider ez flu shot [influenza vac split quad]  Family History  Problem Relation Age of Onset  . Diabetes Mother   . Arthritis Mother   . Obesity Mother   . Diabetes Father   .  Heart disease Father   . Heart attack Father   . Cancer Father        bone  . Heart disease Paternal Grandmother   . Cancer Paternal Grandmother        bone  . Breast cancer Paternal Grandmother   . Cancer Paternal Grandfather        pancreatic?  Marland Kitchen Heart disease Paternal Grandfather   . Osteoporosis Maternal Grandmother   . Arthritis Maternal Grandmother   . Varicose Veins Maternal  Grandmother   . Alcohol abuse Maternal Uncle   . Asthma Brother   . Cancer Paternal Aunt   . Diabetes Paternal Aunt   . Cancer Paternal Uncle     Social History Social History   Tobacco Use  . Smoking status: Former Smoker    Packs/day: 1.00    Years: 25.00    Pack years: 25.00    Types: Cigarettes    Quit date: 04/17/2004    Years since quitting: 15.4  . Smokeless tobacco: Never Used  Vaping Use  . Vaping Use: Never used  Substance Use Topics  . Alcohol use: Yes    Alcohol/week: 0.0 standard drinks    Comment: socially  . Drug use: No    Comment: past only    Review of Systems Constitutional: No fever/chills Eyes: No visual changes. ENT: No sore throat. Cardiovascular: Positive for chest pain. Respiratory: Denies shortness of breath. Gastrointestinal: No abdominal pain.  No nausea, no vomiting.  No diarrhea.  No constipation. Genitourinary: Negative for dysuria. Musculoskeletal: Negative for neck pain.  Negative for back pain. Integumentary: Negative for rash. Neurological: Negative for headaches, focal weakness or numbness.  ____________________________________________   PHYSICAL EXAM:  VITAL SIGNS: ED Triage Vitals  Enc Vitals Group     BP 09/29/19 0218 138/89     Pulse Rate 09/29/19 0018 (!) 108     Resp 09/29/19 0018 18     Temp 09/29/19 0018 99.3 F (37.4 C)     Temp Source 09/29/19 0018 Oral     SpO2 09/29/19 0018 97 %     Weight 09/29/19 0017 90.7 kg (200 lb)     Height 09/29/19 0017 1.6 m (5\' 3" )     Head Circumference --      Peak Flow --      Pain Score 09/29/19 0024 5     Pain Loc --      Pain Edu? --      Excl. in [place]? --     Constitutional: Alert and oriented.  Eyes: Conjunctivae are normal.  Mouth/Throat: Patient is wearing a mask. Neck: No stridor.  No meningeal signs.   Cardiovascular: Normal rate, regular rhythm. Good peripheral circulation. Grossly normal heart sounds. Respiratory: Normal respiratory effort.  No  retractions. Gastrointestinal: Soft and nontender. No distention.  Musculoskeletal: No lower extremity tenderness nor edema. No gross deformities of extremities. Neurologic:  Normal speech and language. No gross focal neurologic deficits are appreciated.  Skin:  Skin is warm, dry and intact. Psychiatric: Mood and affect are normal. Speech and behavior are normal.  ____________________________________________   LABS (all labs ordered are listed, but only abnormal results are displayed)  Labs Reviewed  CBC - Abnormal; Notable for the following components:      Result Value   WBC 12.7 (*)    All other components within normal limits  BASIC METABOLIC PANEL  POC URINE PREG, ED  TROPONIN I (HIGH SENSITIVITY)  TROPONIN I (HIGH SENSITIVITY)   ____________________________________________  EKG  ED ECG REPORT I, [place] N Johnda Billiot, the attending physician, personally viewed and interpreted this ECG.   Date: 09/29/2019  EKG Time: 12:20 AM  Rate: 113  Rhythm: Sinus tachycardia  Axis: Normal  Intervals: Normal  ST&T Change: None  ____________________________________________  RADIOLOGY I, [place] Idriss Quackenbush, personally viewed and evaluated these images (plain radiographs) as part of my medical decision making, as well as reviewing the written report by the radiologist.  ED MD interpretation: No active cardiopulmonary disease noted on chest x-ray per radiologist  Official radiology report(s): DG Chest 2 View  Result Date: 09/29/2019 CLINICAL DATA:  Chest pain EXAM: CHEST - 2 VIEW COMPARISON:  05/02/2019 FINDINGS: Heart and mediastinal contours are within normal limits. No focal opacities or effusions. No acute bony abnormality. IMPRESSION: No active cardiopulmonary disease. Electronically Signed   By: Rolm Baptise M.D.   On: 09/29/2019 02:48    ____ Procedures   ____________________________________________   INITIAL IMPRESSION / MDM / [place] / ED COURSE  As part of  my medical decision making, I reviewed the following data within the electronic MEDICAL RECORD NUMBER 54 year old female with above-stated history and physical exam a differential diagnosis including but not limited to ACS, aortic dissection or worsening of known thoracic aortic aneurysm, pulmonary emboli.  EKG revealed no evidence of ischemia or infarction laboratory data including high-sensitivity troponin negative x2.  CT angiogram of the chest was performed which revealed no evidence of Neri emboli, ascending thoracic aorta aneurysm unchanged.  Clear etiology for the patient's symptoms identified and as such patient will be referred to cardiology for further outpatient evaluation. ____________________________________________  FINAL CLINICAL IMPRESSION(S) / ED DIAGNOSES  Final diagnoses:  Chest pain, unspecified type     MEDICATIONS GIVEN DURING THIS VISIT:  Medications  sodium chloride flush (NS) 0.9 % injection 3 mL (has no administration in time range)  ondansetron (ZOFRAN-ODT) disintegrating tablet 4 mg (4 mg Oral Given 09/29/19 0227)  iohexol (OMNIPAQUE) 350 MG/ML injection 75 mL (75 mLs Intravenous Contrast Given 09/29/19 0612)     ED Discharge Orders    None      *Please note:  Brittany Ochoa was evaluated in Emergency Department on 09/29/2019 for the symptoms described in the history of present illness. She was evaluated in the context of the global COVID-19 pandemic, which necessitated consideration that the patient might be at risk for infection with the SARS-CoV-2 virus that causes COVID-19. Institutional protocols and algorithms that pertain to the evaluation of patients at risk for COVID-19 are in a state of rapid change based on information released by regulatory bodies including the CDC and federal and state organizations. These policies and algorithms were followed during the patient's care in the ED.  Some ED evaluations and interventions may be delayed as a result of  limited staffing during the pandemic.*  Note:  This document was prepared using Dragon voice recognition software and may include unintentional dictation errors.   Gregor Hams, MD 09/30/19 [phone]

## 2019-09-30 ENCOUNTER — Other Ambulatory Visit: Payer: Self-pay | Admitting: Family Medicine

## 2019-09-30 ENCOUNTER — Encounter: Payer: Self-pay | Admitting: Family Medicine

## 2019-09-30 DIAGNOSIS — R232 Flushing: Secondary | ICD-10-CM

## 2019-09-30 DIAGNOSIS — R252 Cramp and spasm: Secondary | ICD-10-CM

## 2019-09-30 DIAGNOSIS — R42 Dizziness and giddiness: Secondary | ICD-10-CM

## 2019-09-30 NOTE — Progress Notes (Signed)
Ref

## 2019-10-07 ENCOUNTER — Encounter: Payer: Self-pay | Admitting: Neurology

## 2019-10-09 ENCOUNTER — Other Ambulatory Visit: Payer: Self-pay | Admitting: Endocrinology

## 2019-10-09 DIAGNOSIS — R002 Palpitations: Secondary | ICD-10-CM | POA: Diagnosis not present

## 2019-10-09 DIAGNOSIS — R635 Abnormal weight gain: Secondary | ICD-10-CM | POA: Diagnosis not present

## 2019-10-09 DIAGNOSIS — E049 Nontoxic goiter, unspecified: Secondary | ICD-10-CM | POA: Diagnosis not present

## 2019-10-09 DIAGNOSIS — I10 Essential (primary) hypertension: Secondary | ICD-10-CM | POA: Diagnosis not present

## 2019-10-09 DIAGNOSIS — Z923 Personal history of irradiation: Secondary | ICD-10-CM | POA: Diagnosis not present

## 2019-10-09 DIAGNOSIS — R000 Tachycardia, unspecified: Secondary | ICD-10-CM | POA: Diagnosis not present

## 2019-10-09 DIAGNOSIS — R7301 Impaired fasting glucose: Secondary | ICD-10-CM | POA: Diagnosis not present

## 2019-10-13 DIAGNOSIS — I10 Essential (primary) hypertension: Secondary | ICD-10-CM | POA: Diagnosis not present

## 2019-10-13 DIAGNOSIS — R000 Tachycardia, unspecified: Secondary | ICD-10-CM | POA: Diagnosis not present

## 2019-10-18 DIAGNOSIS — R079 Chest pain, unspecified: Secondary | ICD-10-CM | POA: Diagnosis not present

## 2019-10-18 DIAGNOSIS — R002 Palpitations: Secondary | ICD-10-CM | POA: Insufficient documentation

## 2019-10-18 DIAGNOSIS — Z5321 Procedure and treatment not carried out due to patient leaving prior to being seen by health care provider: Secondary | ICD-10-CM | POA: Insufficient documentation

## 2019-10-19 ENCOUNTER — Other Ambulatory Visit: Payer: Self-pay

## 2019-10-19 ENCOUNTER — Emergency Department
Admission: EM | Admit: 2019-10-19 | Discharge: 2019-10-19 | Disposition: A | Discharge status: Home / Self Care | Payer: 59 | Attending: Emergency Medicine | Admitting: Emergency Medicine

## 2019-10-19 ENCOUNTER — Emergency Department: Payer: 59

## 2019-10-19 ENCOUNTER — Encounter: Payer: Self-pay | Admitting: Emergency Medicine

## 2019-10-19 DIAGNOSIS — R002 Palpitations: Secondary | ICD-10-CM | POA: Diagnosis not present

## 2019-10-19 DIAGNOSIS — Z5321 Procedure and treatment not carried out due to patient leaving prior to being seen by health care provider: Secondary | ICD-10-CM | POA: Diagnosis not present

## 2019-10-19 DIAGNOSIS — R079 Chest pain, unspecified: Secondary | ICD-10-CM | POA: Diagnosis not present

## 2019-10-19 LAB — BASIC METABOLIC PANEL
Anion gap: 9 (ref 5–15)
BUN: 15 mg/dL (ref 6–20)
CO2: 27 mmol/L (ref 22–32)
Calcium: 8.9 mg/dL (ref 8.9–10.3)
Chloride: 104 mmol/L (ref 98–111)
Creatinine, Ser: 0.73 mg/dL (ref 0.44–1.00)
GFR calc Af Amer: >60 mL/min (ref >60)
GFR calc non Af Amer: >60 mL/min (ref >60)
Glucose, Bld: 136 mg/dL — ABNORMAL HIGH (ref 70–99)
Potassium: 3.5 mmol/L (ref 3.5–5.1)
Sodium: 140 mmol/L (ref 135–145)

## 2019-10-19 LAB — CBC
HCT: 38.4 % (ref 36.0–46.0)
Hemoglobin: 12.8 g/dL (ref 12.0–15.0)
MCH: 30.5 pg (ref 26.0–34.0)
MCHC: 33.3 g/dL (ref 30.0–36.0)
MCV: 91.6 fL (ref 80.0–100.0)
Platelets: 273 10*3/uL (ref 150–400)
RBC: 4.19 MIL/uL (ref 3.87–5.11)
RDW: 12.9 % (ref 11.5–15.5)
WBC: 6.7 10*3/uL (ref 4.0–10.5)
nRBC: 0 % (ref 0.0–0.2)

## 2019-10-19 LAB — TROPONIN I (HIGH SENSITIVITY): Troponin I (High Sensitivity): 4 ng/L (ref <18)

## 2019-10-19 NOTE — ED Notes (Signed)
Pt to registration desk, states she has not had any further episodes of chest pain or palpitations and is going home.  Advised patient to return to ED or call EMS if anything changes. Pt verbalized understanding. Ambulatory without difficulty.

## 2019-10-19 NOTE — ED Triage Notes (Addendum)
Patient states that she is here all the time that she gets a cold flash then her heart rate goes up to 125 and her blood pressure goes up. Patient states that her cardiologist told her that it is anxiety but the patient state that she does not have anxiety.

## 2019-10-23 ENCOUNTER — Other Ambulatory Visit: Payer: 59

## 2019-10-23 ENCOUNTER — Encounter: Payer: Self-pay | Admitting: Family Medicine

## 2019-10-24 ENCOUNTER — Encounter: Payer: Self-pay | Admitting: Family

## 2019-10-24 ENCOUNTER — Other Ambulatory Visit: Payer: Self-pay | Admitting: Family

## 2019-10-24 ENCOUNTER — Encounter: Payer: Self-pay | Admitting: Family Medicine

## 2019-10-24 ENCOUNTER — Ambulatory Visit (INDEPENDENT_AMBULATORY_CARE_PROVIDER_SITE_OTHER): Payer: 59 | Admitting: Family

## 2019-10-24 ENCOUNTER — Other Ambulatory Visit: Payer: Self-pay

## 2019-10-24 VITALS — BP 132/56 | HR 76 | Ht 63.0 in | Wt 204.5 lb

## 2019-10-24 DIAGNOSIS — Z6836 Body mass index (BMI) 36.0-36.9, adult: Secondary | ICD-10-CM | POA: Diagnosis not present

## 2019-10-24 DIAGNOSIS — R42 Dizziness and giddiness: Secondary | ICD-10-CM

## 2019-10-24 DIAGNOSIS — R7303 Prediabetes: Secondary | ICD-10-CM

## 2019-10-24 DIAGNOSIS — I491 Atrial premature depolarization: Secondary | ICD-10-CM | POA: Diagnosis not present

## 2019-10-24 DIAGNOSIS — I712 Thoracic aortic aneurysm, without rupture: Secondary | ICD-10-CM

## 2019-10-24 DIAGNOSIS — R002 Palpitations: Secondary | ICD-10-CM

## 2019-10-24 DIAGNOSIS — R000 Tachycardia, unspecified: Secondary | ICD-10-CM | POA: Diagnosis not present

## 2019-10-24 DIAGNOSIS — I10 Essential (primary) hypertension: Secondary | ICD-10-CM

## 2019-10-24 DIAGNOSIS — I7121 Aneurysm of the ascending aorta, without rupture: Secondary | ICD-10-CM

## 2019-10-24 MED ORDER — METOPROLOL TARTRATE 25 MG PO TABS
25.0000 mg | ORAL_TABLET | ORAL | 2 refills | Status: DC | PRN
Start: 2019-10-24 — End: 2019-10-29

## 2019-10-24 NOTE — Patient Instructions (Addendum)
Medication Instructions:  Your physician has recommended you make the following change in your medication:   Recommend not taking Xanax.  START Metoprolol tartrate 25mg  twice daily as needed for palpitations  *If you need a refill on your cardiac medications before your next appointment, please call your pharmacy*  Lab Work: No lab work today.  Testing/Procedures: Your EKG today shows normal sinus rhythm.   Follow-Up: At [facility], you and your health needs are our priority.  As part of our continuing mission to provide you with exceptional heart care, we have created designated Provider Care Teams.  These Care Teams include your primary Cardiologist (physician) and Advanced Practice Providers (APPs -  Physician Assistants and Nurse Practitioners) who all work together to provide you with the care you need, when you need it.  We recommend signing up for the patient portal called "MyChart".  Sign up information is provided on this After Visit Summary.  MyChart is used to connect with patients for Virtual Visits (Telemedicine).  Patients are able to view lab/test results, encounter notes, upcoming appointments, etc.  Non-urgent messages can be sent to your provider as well.   To learn more about what you can do with MyChart, go to [email or web address].    Your next appointment:  1) With Dr. Caryl Comes on 10/30/19 at 11:00 am  2) With Dr. Fletcher Anon or APP in 6 months.   Other Instructions  We will consider echocardiogram (ultrasound of your heart) in the future depending on what you and Dr. Caryl Comes discuss.  To prevent palpitations, tachycardia, and lightheadedness we recommend...  staying well hydrated.  drinking something with electrolytes.  compression stockings.  small, regular meals.   making position changes slowly.

## 2019-10-24 NOTE — Progress Notes (Signed)
Office Visit    Patient Name: Brittany Ochoa Date of Encounter: 10/24/2019  Primary Care Provider:  [name], DO Primary Cardiologist:  Kathlyn Sacramento, MD Electrophysiologist:  None   Chief Complaint    Brittany Ochoa is a 54 y.o. female with a hx of ascending aortic aneurysm, GERD, palpitations, chest pain with cardiac CTA score of 0 presents today for ED follow-up.  Past Medical History    Past Medical History:  Diagnosis Date  . Allergy 2015   seasonal  . Ascending aortic aneurysm ([place])    a. 06/2018 CTA Chest: 4.1 x 4.0 cm fusiform Asc Ao aneurysm-unchanged.  Descending thoracic aorta 2.1 cm.  . Asthma    exercised induced  . Dyspnea on exertion    a. 06/2017 echo: EF 60 to 65%.  No regional wall motion abnormalities.  Ascending aortic diameter 3.9 cm.  Mild MR.  . Essential hypertension   . Multiple thyroid nodules   . OCD (obsessive compulsive disorder)   . Reflux esophagitis    Past Surgical History:  Procedure Laterality Date  . BREAST REDUCTION SURGERY  1998  . COSMETIC SURGERY  1997   breast reduction  . ESOPHAGOGASTRODUODENOSCOPY (EGD) WITH PROPOFOL N/A 01/24/2018   Procedure: ESOPHAGOGASTRODUODENOSCOPY (EGD) WITH PROPOFOL;  Surgeon: [name], MD;  Location: [facility] ENDOSCOPY;  Service: Endoscopy;  Laterality: N/A;  . lipoma removal  2007   back  . REDUCTION MAMMAPLASTY Bilateral 12/01/1996    Allergies  Allergies  Allergen Reactions  . Codeine   . Medical Provider Ez Flu Shot [Influenza Vac Split Quad] Other (See Comments)    Severe myalgias and bone pain    History of Present Illness    Brittany Ochoa is a 54 y.o. female with a hx of palpitations, HTN, previous tobacco use (quit 2006), small ascending aortic aneurysm 4.1 X4 cm followed by Dr. Cyndia Bent, [place] 03/2019.  She was last seen by Dr. Fletcher Anon 06/19/2019.  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 Former smoker with quit date 2006.   Seen in clinic 10/2018  with elevated BP and started on Losartan. Seen by pulmonology 11/2018 with normal PFTs. Seen in follow up 12/12/18 with palpitations recommended for monitor. ZIO monitor 12/2018 showed NSR with infrequent PACs. Diagnosed with COVID-19 in December. Seen in clinic 06/16/19 reporting elevated blood pressures and chest pain with lightheadedness and palpitations. She had self discontinued her Losartan, but found her BP became increasingly elevated after having COVID. She had resumed her Losartan and was recommended to increase to 50mg  daily. Subsequent cardiac CTA with calcium score of zero.   ED visit 09/29/19 with chest pain and rapid heart beat upon waking. BP per EMS was 220/170. High sensitivity troponin negative x2 and EKG without acute findings. CT chest with unchanged thoracic aortic aneurysm. Presented to ED 10/19/19 with chest pain and palpitations. She was not seen by a provider, but preliminary workup included CXR with no acute fndings, Hb 12.8, HS troponin negative x2, K 3.5, GFR >60, creatinine 0.73.  She has been referred to endocrinology and neurology at her request by primary care. Of note she has been diagnosed with metabolic syndrome and pre-diabetes. She was started on Metformin and has had some diarrhea. Did have diarrhea prior to ED visit and we discussed that this could be source of her low normal potassium.   She reports a constellation of symptoms today including chest pain, palpitations, tachycardia, "cold feeling" over her body, leg cramps, diarrhea, lightheadedness, fatigue.  Does endorse a few times over the last few weeks when she has woken with tachycardia she has taken a quarter tablet (0.25mg ) of an old Xanax prescription that was prescribed to her dog in 60. She was strongly encouraged to dispose of this prescription and not take medication not prescribed to her. Discussed that anxiolytics would need to come from PCP or a mental health provider.   Tells me she gets leg cramps at  night when she is laying down in both legs. Tells me the pain lingers. Happens at least three times per week. Does occasionally drink a natural electrolyte supplement, but does not drink Gatorade as she does not like the ingredients.   Endorses palpitations with waking from sleep with her heart "beating in her throat" associated with diaphoresis. Feels like a pounding sensation. This feels different than previous sensation when she wore the monitor last September and has worsened since she had COVID.   She has been talking with friends and family about her consition. Tells me it "has to do with her autonomic system". Has a cousin with dysautonomia and worries she has this too. Worries she has atrial fibrillation and wants to talk with EP about a loop recorder. Tells me she is afraid to exercise as she is fearful of the symptoms.   Endorses her palpitations are associated with both lightheadedness and dizziness. Orthostatic vital signs in clinic:   Lying 130/82 81  Sitting 130/86 85  Standing (0 minutes) 117/83 86  Standing (3 minutes) 123/85 86    EKGs/Labs/Other Studies Reviewed:   The following studies were reviewed today:  Monitor 12/2018 7 days Zio patch monitor:   Normal sinus rhythm with an average heart rate of 81 bpm. Isolated PACs and no significant PVCs. No significant arrhythmia. Some of the triggered events correlated with PACs.  However, most events did not correlate with arrhythmia  Echo 06/2017 Left ventricle: The cavity size was normal. There was mild    concentric hypertrophy. Systolic function was normal. The    estimated ejection fraction was in the range of 60% to 65%. Wall    motion was normal; there were no regional wall motion    abnormalities. Left ventricular diastolic function parameters    were normal.  - Aorta: Ascending aortic diameter: 39 mm (S).  - Mitral valve: There was mild regurgitation.   EKG:  EKG is ordered today.  The ekg ordered today  demonstrates SR 76 bpm with no acute ST/T wave changes.  Recent Labs: 09/26/2019: ALT 24; Magnesium 2.3; TSH 0.652 10/19/2019: BUN 15; Creatinine, Ser 0.73; Hemoglobin 12.8; Platelets 273; Potassium 3.5; Sodium 140  Recent Lipid Panel    Component Value Date/Time   CHOL 223 (H) 09/26/2019 1552   TRIG 103 09/26/2019 1552   HDL 72 09/26/2019 1552   LDLCALC 133 (H) 09/26/2019 1552    Home Medications   Current Meds  Medication Sig  . albuterol (PROVENTIL HFA;VENTOLIN HFA) 108 (90 Base) MCG/ACT inhaler Inhale 2 puffs into the lungs every 4 (four) hours as needed.  Marland Kitchen alprazolam (XANAX) 2 MG tablet Take 0.5 mg by mouth daily as needed for sleep.  . fluticasone (FLONASE) 50 MCG/ACT nasal spray Place 2 sprays into both nostrils daily as needed for allergies or rhinitis.  Marland Kitchen losartan (COZAAR) 50 MG tablet Take 50 mg by mouth daily.  Marland Kitchen METFORMIN HCL ER, MOD, PO Take 500 mg by mouth daily.  . Vitamin D, Ergocalciferol, (DRISDOL) 1.25 MG (50000 UNIT) CAPS capsule Take 1  capsule (50,000 Units total) by mouth every 7 (seven) days.      Review of Systems       Review of Systems  Constitutional: Positive for diaphoresis and malaise/fatigue. Negative for chills and fever.  Cardiovascular: Positive for dyspnea on exertion and palpitations. Negative for chest pain, leg swelling, near-syncope, orthopnea and syncope.  Respiratory: Negative for cough, shortness of breath and wheezing.   Gastrointestinal: Positive for diarrhea. Negative for nausea and vomiting.  Neurological: Positive for dizziness and light-headedness. Negative for weakness.   All other systems reviewed and are otherwise negative except as noted above.  Physical Exam    VS:  BP (!) 132/56 (BP Location: Left Arm, Patient Position: Sitting, Cuff Size: Normal)   Pulse 76   Ht 5\' 3"  (1.6 m)   Wt 204 lb 8 oz (92.8 kg)   SpO2 98%   BMI 36.23 kg/m  , BMI Body mass index is 36.23 kg/m. GEN: Well nourished, overweight, well developed,  in no acute distress. HEENT: normal. Neck: Supple, no JVD, carotid bruits, or masses. Cardiac: RRR, no murmurs, rubs, or gallops. No clubbing, cyanosis, edema.  Radials/DP/PT 2+ and equal bilaterally.  Respiratory:  Respirations regular and unlabored, clear to auscultation bilaterally. GI: Soft, nontender, nondistended, BS + x 4. MS: No deformity or atrophy. Skin: Warm and dry, no rash. Neuro:  Strength and sensation are intact. Psych: Normal affect. Anxious.  Assessment & Plan    1. Palpitations/tachycardia/PAC/Lightheadedness/Dizziness - EKG today NSR. Previous ZIO 11/2018 with infrequent PAC. Reports increased tachycardia, palpitations, lightheadedness, dizziness since COVID 19. She is worried about dysautonomia, atrial fibrillation. Did my best to reassure her.  TSH 09/26/19 of 0.652. Recent BMET/CBC with K 3.5, no evidence of anemia. Orthostatic vital signs today in office with drop in BP on sitting to standing, but no significant HR fluctuation. No excessive caffeine intake, no noted proarrhythmic medications.   Rx for Metoprolol Tartrate 25mg  twice daily as needed for palpitations. Could consider Metoprolol Succinate, but will defer to Dr. Caryl Comes for further medication titration.  Referred to EP per her request.   She has been taking Xanax initially prescribed to her dog as needed and was emphasized to discard of this medication and discuss anxiolytic with her primary care provider if needed. We discussed that anxiety could be contributory to both palpitations and BP.   2. Chest pain - Cardiac CT with calcium score of 0. Consider etiology musculoskeletal vs anxiety.   3. DOE - Likely multifactorial. PFT were normal 11/2018. Echo 06/2017 with normal LVEF. Consider etiology deconditioning, anxiety. Cardiac CTA with no evidence of CAD. In setting of palpitations and dyspnea, consider repeat echocardiogram but will defer to her upcoming appointment with Dr. Caryl Comes.   4. HTN - BP well  controlled. Continue Losartan 50mg  daily.   5. Ascending aortic aneurysm - Stable 4.0 cm fusiform ascending aortic aneurysm. Continue to follow with Dr. Cyndia Bent.  6. Prediabetes/Metabolic syndrome/Obesity - Started Metformin recently with some diarrhea. Encouraged to stay well hydrated to avoid dehydration triggering palpitation. Weight loss via diet and exercise encouraged. Continue to follow with Dr. Chalmers Cater of endocrinology.   Disposition: Follow up July 15th with Dr. Caryl Comes. Follow up in 6 months with Dr. Fletcher Anon or APP.   Loel Dubonnet, NP 10/24/2019, 8:25 PM

## 2019-10-29 ENCOUNTER — Encounter: Payer: Self-pay | Admitting: Family Medicine

## 2019-10-29 ENCOUNTER — Telehealth (INDEPENDENT_AMBULATORY_CARE_PROVIDER_SITE_OTHER): Payer: 59 | Admitting: Family Medicine

## 2019-10-29 DIAGNOSIS — R002 Palpitations: Secondary | ICD-10-CM | POA: Diagnosis not present

## 2019-10-29 MED ORDER — LORAZEPAM 0.5 MG PO TABS: 0.2500 mg | ORAL_TABLET | Freq: Every day | ORAL | 0 refills | Status: DC | PRN

## 2019-10-29 MED ORDER — METOPROLOL SUCCINATE ER 25 MG PO TB24: 25.0000 mg | ORAL_TABLET | Freq: Every day | ORAL | 3 refills | Status: DC

## 2019-10-29 NOTE — Progress Notes (Signed)
There were no vitals taken for this visit.   Subjective:    Patient ID: Brittany Ochoa, female    DOB: 1965-06-12, 54 y.o.   MRN: [id number]  HPI: Brittany Ochoa is a 54 y.o. female  Chief Complaint  Patient presents with  . Palpitations    taking dogs xanax, not anxiety, wants Rx for her own. Stated if she doesnt receive a Rx she will continue to use dogs medicaton.    Brittany Ochoa saw cardiology. She has an appointment scheduled with electrophysiology tomorrow. She has not been taking her metoprolol every day- just when her HR is running high. She has been to the ER several times due to CP and elevated BP. She notes that sometimes her HR will be fast, but her BP will good. When that happens, she doesn't feel anxious, but she worries about her BP going up. She has taken her dog's xanax 3x in the past 3 months. She notes that her elevated HR last 2-3 hours. Even when her HR is not high, she's still sitting with a resting HR in the 90s. She will watch her HR will drop from 100s- 80s- 60s-90s. HR will jump around. Sometimes when her heart races she will not get chest pain. This all started after she had COVID. She notes that initially after COVID she was feeling better, but then got worse.   PALPITATIONS Duration: months Symptom description: heart racing Duration of episode: hours Frequency: intermittent Activity when event occurred: rest Related to exertion: no Dyspnea: no Chest pain: yes Syncope: no Anxiety/stress: yes Nausea/vomiting: no Diaphoresis: no Coronary artery disease: no Congestive heart failure: no Arrhythmia:no Thyroid disease: no Caffeine intake: 1 cafienated beverage Status:  worse Treatments attempted:xanax   Relevant past medical, surgical, family and social history reviewed and updated as indicated. Interim medical history since our last visit reviewed. Allergies and medications reviewed and updated.  Review of Systems  Constitutional: Positive for  diaphoresis and fatigue. Negative for activity change, appetite change, chills, fever and unexpected weight change.  HENT: Negative.   Respiratory: Positive for chest tightness and shortness of breath. Negative for apnea, cough, choking, wheezing and stridor.   Cardiovascular: Positive for chest pain and palpitations. Negative for leg swelling.  Gastrointestinal: Negative.   Neurological: Negative.   Psychiatric/Behavioral: Negative.     Per HPI unless specifically indicated above     Objective:    There were no vitals taken for this visit.  Wt Readings from Last 3 Encounters:  10/30/19 203 lb 6 oz (92.3 kg)  10/24/19 204 lb 8 oz (92.8 kg)  10/19/19 200 lb (90.7 kg)    Physical Exam Vitals and nursing note reviewed.  Constitutional:      General: She is not in acute distress.    Appearance: Normal appearance. She is not ill-appearing, toxic-appearing or diaphoretic.  HENT:     Head: Normocephalic and atraumatic.     Right Ear: External ear normal.     Left Ear: External ear normal.     Nose: Nose normal.     Mouth/Throat:     Mouth: Mucous membranes are moist.     Pharynx: Oropharynx is clear.  Eyes:     General: No scleral icterus.       Right eye: No discharge.        Left eye: No discharge.     Conjunctiva/sclera: Conjunctivae normal.     Pupils: Pupils are equal, round, and reactive to light.  Pulmonary:  Effort: Pulmonary effort is normal. No respiratory distress.     Comments: Speaking in full sentences Musculoskeletal:        General: Normal range of motion.     Cervical back: Normal range of motion.  Skin:    Coloration: Skin is not jaundiced or pale.     Findings: No bruising, erythema, lesion or rash.  Neurological:     Mental Status: She is alert and oriented to person, place, and time. Mental status is at baseline.  Psychiatric:        Mood and Affect: Mood normal.        Behavior: Behavior normal.        Thought Content: Thought content normal.          Judgment: Judgment normal.     Results for orders placed or performed during the hospital encounter of 56/25/63  Basic metabolic panel  Result Value Ref Range   Sodium 140 135 - 145 mmol/L   Potassium 3.5 3.5 - 5.1 mmol/L   Chloride 104 98 - 111 mmol/L   CO2 27 22 - 32 mmol/L   Glucose, Bld 136 (H) 70 - 99 mg/dL   BUN 15 6 - 20 mg/dL   Creatinine, Ser 0.73 0.44 - 1.00 mg/dL   Calcium 8.9 8.9 - 10.3 mg/dL   GFR calc non Af Amer >60 >60 mL/min   GFR calc Af Amer >60 >60 mL/min   Anion gap 9 5 - 15  CBC  Result Value Ref Range   WBC 6.7 4.0 - 10.5 K/uL   RBC 4.19 3.87 - 5.11 MIL/uL   Hemoglobin 12.8 12.0 - 15.0 g/dL   HCT 38.4 36 - 46 %   MCV 91.6 80.0 - 100.0 fL   MCH 30.5 26.0 - 34.0 pg   MCHC 33.3 30.0 - 36.0 g/dL   RDW 12.9 11.5 - 15.5 %   Platelets 273 150 - 400 K/uL   nRBC 0.0 0.0 - 0.2 %  Troponin I (High Sensitivity)  Result Value Ref Range   Troponin I (High Sensitivity) 4 <18 ng/L      Assessment & Plan:   Problem List Items Addressed This Visit      Other   Palpitations - Primary    Given HR consistently in the 90s, encouraged patient to take metoprolol daily rather than PRN. Will see EP tomorrow, await their input. Will give small Rx of lorazepam to take when heart races in case there is an anxiety component. Longer acting than the xanax. Strongly encouraged patient to throw out her dog's xanax and stressed the danger of taking medication not prescibed to her. Call with any concerns. Continue to monitor.           Follow up plan: Return in about 4 weeks (around 11/26/2019).   . This visit was completed via MyChart due to the restrictions of the COVID-19 pandemic. All issues as above were discussed and addressed. Physical exam was done as above through visual confirmation on MyChart. If it was felt that the patient should be evaluated in the office, they were directed there. The patient verbally consented to this visit. . Location of the patient:  work . Location of the provider: work . Those involved with this call:  . Provider: Park Liter, DO . CMA: Lauretta Grill, RMA . Front Desk/Registration: Don Perking  . Time spent on call: 15 minutes with patient face to face via video conference. More than 50% of this time was spent  in counseling and coordination of care. 23 minutes total spent in review of patient's record and preparation of their chart.

## 2019-10-30 ENCOUNTER — Ambulatory Visit (INDEPENDENT_AMBULATORY_CARE_PROVIDER_SITE_OTHER): Payer: 59

## 2019-10-30 ENCOUNTER — Encounter: Payer: Self-pay | Admitting: Internal Medicine

## 2019-10-30 ENCOUNTER — Ambulatory Visit (INDEPENDENT_AMBULATORY_CARE_PROVIDER_SITE_OTHER): Payer: 59 | Admitting: Internal Medicine

## 2019-10-30 ENCOUNTER — Other Ambulatory Visit: Payer: Self-pay

## 2019-10-30 VITALS — BP 128/86 | HR 69 | Ht 63.0 in | Wt 203.4 lb

## 2019-10-30 DIAGNOSIS — R002 Palpitations: Secondary | ICD-10-CM | POA: Diagnosis not present

## 2019-10-30 DIAGNOSIS — R42 Dizziness and giddiness: Secondary | ICD-10-CM

## 2019-10-30 DIAGNOSIS — I10 Essential (primary) hypertension: Secondary | ICD-10-CM

## 2019-10-30 DIAGNOSIS — R0609 Other forms of dyspnea: Secondary | ICD-10-CM

## 2019-10-30 DIAGNOSIS — R0600 Dyspnea, unspecified: Secondary | ICD-10-CM

## 2019-10-30 NOTE — Patient Instructions (Addendum)
Medication Instructions:  Your physician recommends that you continue on your current medications as directed. Please refer to the Current Medication list given to you today.  *If you need a refill on your cardiac medications before your next appointment, please call your pharmacy*   Lab Work: none If you have labs (blood work) drawn today and your tests are completely normal, you will receive your results only by: Marland Kitchen MyChart Message (if you have MyChart) OR . A paper copy in the mail If you have any lab test that is abnormal or we need to change your treatment, we will call you to review the results.   Testing/Procedures:  1- GXT (Treadmill Exercise Stress Test) - On same day that Dr Caryl Comes is in the office.  Your physician has requested that you have an exercise tolerance test.   DO NOT drink or eat foods with caffeine for 24 hours before the test. (Chocolate, coffee, tea, decaf coffee/tea, or energy drinks)  DO NOT smoke for 4 hours before your test.  If you use an inhaler, bring it with you to the test.  Wear comfortable shoes and clothing. Women do not wear dresses.  DO NOT TAKE Metoprolol the night before or morning of the stress test.    2- Your physician has requested that you have a renal artery duplex. During this test, an ultrasound is used to evaluate blood flow to the kidneys. Allow one hour for this exam. Do not eat after midnight the day before and avoid carbonated beverages. Take your medications as you usually do.   No food after 11PM the night before.  Water is OK. (Don't drink liquids if you have been instructed not to for ANOTHER test).  Take two Extra-Strength Gas-X capsules at bedtime the night before test.   Take an additional two Extra-Strength Gas-X capsules three (3) hours before the test or first thing in the morning.    Avoid foods that produce bowel gas, for 24 hours prior to exam (see below).    No breakfast, no chewing gum, no smoking or  carbonated beverages.  Patient may take morning medications with water.  Come in for test at least 15 minutes early to register.   3-   ZIO MONITOR FOR 14 DAYS  Your physician has recommended that you wear a Zio monitor. This monitor is a medical device that records the heart's electrical activity. Doctors most often use these monitors to diagnose arrhythmias. Arrhythmias are problems with the speed or rhythm of the heartbeat. The monitor is a small device applied to your chest. You can wear one while you do your normal daily activities. While wearing this monitor if you have any symptoms to push the button and record what you felt. Once you have worn this monitor for the period of time provider prescribed (Usually 14 days), you will return the monitor device in the postage paid box. Once it is returned they will download the data collected and provide [place] with a report which the provider will then review and we will call you with those results. Important tips:  1. Avoid showering during the first 24 hours of wearing the monitor. 2. Avoid excessive sweating to help maximize wear time. 3. Do not submerge the device, no hot tubs, and no swimming pools. 4. Keep any lotions or oils away from the patch. 5. After 24 hours you may shower with the patch on. Take brief showers with your back facing the shower head.  6. Do not remove  patch once it has been placed because that will interrupt data and decrease adhesive wear time. 7. Push the button when you have any symptoms and write down what you were feeling. 8. Once you have completed wearing your monitor, remove and place into box which has postage paid and place in your outgoing mailbox.  9. If for some reason you have misplaced your box then call our office and we can provide another box and/or mail it off for you. 10.   Follow-Up:  You have been referred to Pulmonology for Sleep study evaluation. Someone will call you to schedule the appointment.  If you do not receive a call, please call the number listed on referral on next page.    At [facility], you and your health needs are our priority.  As part of our continuing mission to provide you with exceptional heart care, we have created designated Provider Care Teams.  These Care Teams include your primary Cardiologist (physician) and Advanced Practice Providers (APPs -  Physician Assistants and Nurse Practitioners) who all work together to provide you with the care you need, when you need it.  We recommend signing up for the patient portal called "MyChart".  Sign up information is provided on this After Visit Summary.  MyChart is used to connect with patients for Virtual Visits (Telemedicine).  Patients are able to view lab/test results, encounter notes, upcoming appointments, etc.  Non-urgent messages can be sent to your provider as well.   To learn more about what you can do with MyChart, go to [email or web address].    Your next appointment:   After testing is completed.  Could be done on same day as the stress test given that Renal and ZIO have resulted.  The format for your next appointment:   In Person  Provider:   Virl Axe, MD    Exercise Stress Test An exercise stress test is a test to check how your heart works during exercise. You will need to walk on a treadmill or ride an exercise bike for this test. An electrocardiogram (ECG) will record your heartbeat when you are at rest and when you are exercising. You may have an ultrasound or nuclear test after the exercise test. The test is done to check for coronary artery disease (CAD). It is also done to:  See how well you can exercise.  Watch for high blood pressure during exercise.  Test how well you can exercise after treatment.  Check the blood flow to your arms and legs. If your test result is not normal, more testing may be needed. What happens before the procedure?  Follow instructions from your doctor  about what you cannot eat or drink. ? Do not have any drinks or foods that have caffeine in them for 24 hours before the test, or as told by your doctor. This includes coffee, tea (even decaf tea), sodas, chocolate, and cocoa.  Ask your doctor about changing or stopping your normal medicines. This is important if you: ? Take diabetes medicines. ? Take beta-blocker medicines. ? Wear a nitroglycerin patch.  If you use an inhaler, bring it with you to the test.  Do not put lotions, powders, creams, or oils on your chest before the test.  Wear comfortable shoes and clothing.  Do not use any products that have nicotine or tobacco in them, such as cigarettes and e-cigarettes. Stop using them at least 4 hours before the test. If you need help quitting, ask your doctor. What happens  during the procedure?   Patches (electrodes) will be put on your chest.  Wires will be connected to the patches. The wires will send signals to a machine to record your heartbeat.  Your heart rate will be watched while you are resting and while you are exercising. Your blood pressure will also be watched during the test.  You will walk on a treadmill or use a stationary bike. If you cannot use these, you may be asked to turn a crank with your hands.  The activity will get harder and will raise your heart rate.  You may be asked to breathe into a tube a few times during the test. This measures the gases that you breathe out.  You will be asked how you are feeling throughout the test.  You will exercise until your heart reaches a target heart rate. You will stop early if: ? You feel dizzy. ? You have chest pain. ? You are out of breath. ? Your blood pressure is too high or too low. ? You have an irregular heartbeat. ? You have pain or aching in your arms or legs. The procedure may vary among doctors and hospitals. What happens after the procedure?  Your blood pressure, heart rate, breathing rate, and blood  oxygen level will be watched after the test.  You may return to your normal diet and activities as told by your doctor.  It is up to you to get the results of your test. Ask your doctor, or the department that is doing the test, when your results will be ready. Summary  An exercise stress test is a test to check how your heart works during exercise.  This test is done to check for coronary artery disease.  Your heart rate will be watched while you are resting and while you are exercising.  Follow instructions from your doctor about what you cannot eat or drink before the test. This information is not intended to replace advice given to you by your health care provider. Make sure you discuss any questions you have with your health care provider. Document Revised: 07/16/2018 Document Reviewed: 07/04/2016 Elsevier Patient Education  [place].

## 2019-10-30 NOTE — Progress Notes (Signed)
ELECTROPHYSIOLOGY CONSULT NOTE  Patient ID: Brittany Ochoa, MRN: [id number], DOB/AGE: [birth year] 54 y.o. Admit date: (Not on file) Date of Consult: 10/30/2019  Primary Physician: [name], DO Primary Cardiologist: MA     Brittany Ochoa is a 54 y.o. female who is being seen today for the evaluation of palpitations at the request of MA.    HPI Brittany Ochoa is a 54 y.o. 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 -- pounding and in throat.  And LH which is mostly assoc with elevated BP;   She denies sleep apnea and sleep disturbance--but has put on 40 lbs over the last year  Has seen Dr Chalmers Cater eval has included normal cortisol, metanephrines/VMA   Interval diagnosis of Covid 12/20 blood pressure elevated following infection DATE TEST EF   3/19 Echo   60-65 % LVH   Ascending Aorta 3.9 cm   3/21 Cor CTA  Ca Score 0 Normal Arteries  3/21 CTA  Aorta 40 mm   Date Cr K Hgb  7/21 0.73 3.5 12.8         Event recorder 9/20 demonstrated PACs and PVCs most triggered events were associated with sinus rhythm.  Personally reviewed.   Past Medical History:  Diagnosis Date  . Allergy 2015   seasonal  . Ascending aortic aneurysm ([place])    a. 06/2018 CTA Chest: 4.1 x 4.0 cm fusiform Asc Ao aneurysm-unchanged.  Descending thoracic aorta 2.1 cm.  . Asthma    exercised induced  . Dyspnea on exertion    a. 06/2017 echo: EF 60 to 65%.  No regional wall motion abnormalities.  Ascending aortic diameter 3.9 cm.  Mild MR.  . Essential hypertension   . Multiple thyroid nodules   . OCD (obsessive compulsive disorder)   . Reflux  esophagitis       Surgical History:  Past Surgical History:  Procedure Laterality Date  . BREAST REDUCTION SURGERY  1998  . COSMETIC SURGERY  1997   breast reduction  . ESOPHAGOGASTRODUODENOSCOPY (EGD) WITH PROPOFOL N/A 01/24/2018   Procedure: ESOPHAGOGASTRODUODENOSCOPY (EGD) WITH PROPOFOL;  Surgeon: [name], MD;  Location: [facility] ENDOSCOPY;  Service: Endoscopy;  Laterality: N/A;  . lipoma removal  2007   back  . REDUCTION MAMMAPLASTY Bilateral 12/01/1996     Home Meds: Current Meds  Medication Sig  . albuterol (PROVENTIL HFA;VENTOLIN HFA) 108 (90 Base) MCG/ACT inhaler Inhale 2 puffs into the lungs every 4 (four) hours as needed.  . fluticasone (FLONASE) 50 MCG/ACT nasal spray Place 2 sprays into both nostrils daily as needed for allergies or rhinitis.  Marland Kitchen losartan (COZAAR) 25 MG tablet Take 25 mg by mouth daily.  . metFORMIN (GLUCOPHAGE-XR) 500 MG 24 hr tablet Take 500 mg by mouth daily.  . Vitamin D, Ergocalciferol, (DRISDOL) 1.25 MG (50000 UNIT) CAPS capsule Take 1 capsule (50,000 Units total) by mouth every 7 (seven) days.    Allergies:  Allergies  Allergen Reactions  . Codeine   . Medical Provider Ez Flu Shot [Influenza Vac Split Quad] Other (See Comments)    Severe myalgias and bone pain    Social History  Socioeconomic History  . Marital status: Single    Spouse name: Not on file  . Number of children: Not on file  . Years of education: Not on file  . Highest education level: Not on file  Occupational History  . Not on file  Tobacco Use  . Smoking status: Former Smoker    Packs/day: 1.00    Years: 25.00    Pack years: 25.00    Types: Cigarettes    Quit date: 04/17/2004    Years since quitting: 15.5  . Smokeless tobacco: Never Used  Vaping Use  . Vaping Use: Never used  Substance and Sexual Activity  . Alcohol use: Yes    Alcohol/week: 0.0 standard drinks    Comment: socially  . Drug use: No    Comment: past only  . Sexual activity: Not  Currently  Other Topics Concern  . Not on file  Social History Narrative  . Not on file   Social Determinants of Health   Financial Resource Strain:   . Difficulty of Paying Living Expenses:   Food Insecurity:   . Worried About Charity fundraiser in the Last Year:   . Arboriculturist in the Last Year:   Transportation Needs:   . Film/video editor (Medical):   Marland Kitchen Lack of Transportation (Non-Medical):   Physical Activity:   . Days of Exercise per Week:   . Minutes of Exercise per Session:   Stress:   . Feeling of Stress :   Social Connections:   . Frequency of Communication with Friends and Family:   . Frequency of Social Gatherings with Friends and Family:   . Attends Religious Services:   . Active Member of Clubs or Organizations:   . Attends Archivist Meetings:   Marland Kitchen Marital Status:   Intimate Partner Violence:   . Fear of Current or Ex-Partner:   . Emotionally Abused:   Marland Kitchen Physically Abused:   . Sexually Abused:      Family History  Problem Relation Age of Onset  . Diabetes Mother   . Arthritis Mother   . Obesity Mother   . Diabetes Father   . Heart disease Father   . Heart attack Father   . Cancer Father        bone  . Heart disease Paternal Grandmother   . Cancer Paternal Grandmother        bone  . Breast cancer Paternal Grandmother   . Cancer Paternal Grandfather        pancreatic?  Marland Kitchen Heart disease Paternal Grandfather   . Osteoporosis Maternal Grandmother   . Arthritis Maternal Grandmother   . Varicose Veins Maternal Grandmother   . Alcohol abuse Maternal Uncle   . Asthma Brother   . Cancer Paternal Aunt   . Diabetes Paternal Aunt   . Cancer Paternal Uncle      ROS:  Please see the history of present illness.     All other systems reviewed and negative.    Physical Exam:  Blood pressure 128/86, pulse 69, height 5\' 3"  (1.6 m), weight 203 lb 6 oz (92.3 kg), SpO2 98 %. General: Well developed, well nourished female in no acute  distress. Head: Normocephalic, atraumatic, sclera non-icteric, no xanthomas, nares are without discharge. EENT: normal  Lymph Nodes:  none Neck: Negative for carotid bruits. JVD not elevated. Back:without scoliosis kyphosis  Lungs: Clear bilaterally to auscultation without wheezes, rales, or rhonchi. Breathing is unlabored. Heart: RRR with S1 S2.  No murmur . No rubs, or gallops appreciated. Abdomen: Soft, non-tender, non-distended with normoactive bowel sounds. No hepatomegaly. No rebound/guarding. No obvious abdominal masses. Msk:  Strength and tone appear normal for age. Extremities: No clubbing or cyanosis. No  edema.  Distal pedal pulses are 2+ and equal bilaterally. Skin: Warm and Dry Neuro: Alert and oriented X 3. CN III-XII intact Grossly normal sensory and motor function . Psych:  Responds to questions appropriately with a normal affect.      Labs: Cardiac Enzymes No results for input(s): CKTOTAL, CKMB, TROPONINI in the last 72 hours. CBC Lab Results  Component Value Date   WBC 6.7 10/19/2019   HGB 12.8 10/19/2019   HCT 38.4 10/19/2019   MCV 91.6 10/19/2019   PLT 273 10/19/2019   PROTIME: No results for input(s): LABPROT, INR in the last 72 hours. Chemistry No results for input(s): NA, K, CL, CO2, BUN, CREATININE, CALCIUM, PROT, BILITOT, ALKPHOS, ALT, AST, GLUCOSE in the last 168 hours.  Invalid input(s): LABALBU Lipids Lab Results  Component Value Date   CHOL 223 (H) 09/26/2019   HDL 72 09/26/2019   LDLCALC 133 (H) 09/26/2019   TRIG 103 09/26/2019   BNP No results found for: PROBNP Thyroid Function Tests: No results for input(s): TSH, T4TOTAL, T3FREE, THYROIDAB in the last 72 hours.  Invalid input(s): FREET3 Miscellaneous No results found for: DDIMER  Radiology/Studies:  DG Chest 2 View  Result Date: 10/19/2019 CLINICAL DATA:  Chest pain. EXAM: CHEST - 2 VIEW COMPARISON:  Radiograph and CT 09/29/2019. FINDINGS: The cardiomediastinal contours are normal.  The lungs are clear. Pulmonary vasculature is normal. No consolidation, pleural effusion, or pneumothorax. No acute osseous abnormalities are seen. Stable pectus carinatum. IMPRESSION: No acute chest findings. Stable radiographic appearance of the chest. Electronically Signed   By: Keith Rake M.D.   On: 10/19/2019 00:38    EKG:     Assessment and Plan:  Palpitations  Hypertension-variable  Aortic aneurysm  Weight gain   COVID infection   Not sure the mechanism of her palps as when she was seen by EMS was sinus.  The combination with hypertension suggests adrenergic drive and at the least the nocturnal episodes may be related to sleep apnea.     Will use event recorder to clarify  With the variable BP would suggest renovascular hypertension and renal vascular dopplers  With her aortic aneurysm need to take BP meds-- have strongly suggested BB as primary option  Patient was warned about not using Cipro and similar antibiotics. Recent studies have raised concern that fluoroquinolone antibiotics could be associated with an increased risk of aortic aneurysm Fluoroquinolones have non-antimicrobial properties that might jeopardise the integrity of the extracellular matrix of the vascular wall In a  propensity score matched cohort study in [place], there was a 66% increased rate of aortic aneurysm or dissection associated with oral fluoroquinolone use, compared with amoxicillin use, within a 60 day risk period from start of treatment       Virl Axe

## 2019-11-02 ENCOUNTER — Encounter: Payer: Self-pay | Admitting: Family Medicine

## 2019-11-02 DIAGNOSIS — R002 Palpitations: Secondary | ICD-10-CM | POA: Insufficient documentation

## 2019-11-02 NOTE — Assessment & Plan Note (Signed)
Given HR consistently in the 90s, encouraged patient to take metoprolol daily rather than PRN. Will see EP tomorrow, await their input. Will give small Rx of lorazepam to take when heart races in case there is an anxiety component. Longer acting than the xanax. Strongly encouraged patient to throw out her dog's xanax and stressed the danger of taking medication not prescibed to her. Call with any concerns. Continue to monitor.

## 2019-11-05 ENCOUNTER — Encounter: Payer: Self-pay | Admitting: Family Medicine

## 2019-11-10 ENCOUNTER — Encounter: Payer: Self-pay | Admitting: Family Medicine

## 2019-11-11 ENCOUNTER — Telehealth: Payer: Self-pay | Admitting: Internal Medicine

## 2019-11-11 NOTE — Telephone Encounter (Signed)
Per zio xt # [id number] not registered to site

## 2019-11-11 NOTE — Telephone Encounter (Signed)
The patient had a ZIO XT placed on 10/30/19 when I was out of the office.  I have gone back and registered her to the site.

## 2019-11-12 ENCOUNTER — Telehealth: Payer: Self-pay | Admitting: Internal Medicine

## 2019-11-12 ENCOUNTER — Encounter: Payer: Self-pay | Admitting: Surgery

## 2019-11-12 NOTE — Telephone Encounter (Signed)
Patient would like to discuss receiving a medical exemption for the COVID vaccine as a [employer] employee. She would like to know if Dr. Caryl Comes will have the results by 8/20. Please call to discuss

## 2019-11-12 NOTE — Telephone Encounter (Signed)
Patient came by office  Dropped off forms for medical exemption If possible to complete, patient would like to have ASAP Please call to discuss

## 2019-11-13 NOTE — Telephone Encounter (Signed)
Patient advise request sent to the patient advising her Dr. Caryl Comes will be out of the office this week and next.  I advised she has her GXT on 8/19 and follow up with Dr. Caryl Comes that day.  I asked that she bring the form with her to that appointment for Dr. Caryl Comes to discuss with her.

## 2019-11-27 ENCOUNTER — Ambulatory Visit (INDEPENDENT_AMBULATORY_CARE_PROVIDER_SITE_OTHER): Payer: 59

## 2019-11-27 ENCOUNTER — Other Ambulatory Visit: Payer: Self-pay

## 2019-11-27 DIAGNOSIS — I10 Essential (primary) hypertension: Secondary | ICD-10-CM

## 2019-11-27 DIAGNOSIS — R42 Dizziness and giddiness: Secondary | ICD-10-CM | POA: Diagnosis not present

## 2019-12-01 DIAGNOSIS — R002 Palpitations: Secondary | ICD-10-CM | POA: Diagnosis not present

## 2019-12-04 ENCOUNTER — Ambulatory Visit (INDEPENDENT_AMBULATORY_CARE_PROVIDER_SITE_OTHER): Payer: 59 | Admitting: Internal Medicine

## 2019-12-04 ENCOUNTER — Other Ambulatory Visit: Payer: Self-pay

## 2019-12-04 ENCOUNTER — Encounter: Payer: Self-pay | Admitting: Internal Medicine

## 2019-12-04 VITALS — BP 132/84 | HR 86 | Ht 63.0 in | Wt 193.1 lb

## 2019-12-04 DIAGNOSIS — I10 Essential (primary) hypertension: Secondary | ICD-10-CM

## 2019-12-04 DIAGNOSIS — R002 Palpitations: Secondary | ICD-10-CM | POA: Diagnosis not present

## 2019-12-04 DIAGNOSIS — R0609 Other forms of dyspnea: Secondary | ICD-10-CM

## 2019-12-04 DIAGNOSIS — R0600 Dyspnea, unspecified: Secondary | ICD-10-CM

## 2019-12-04 MED ORDER — PROPRANOLOL HCL ER 60 MG PO CP24: 60.0000 mg | ORAL_CAPSULE | Freq: Every day | ORAL | 0 refills | Status: DC

## 2019-12-04 MED ORDER — METOPROLOL TARTRATE 25 MG PO TABS: 25.0000 mg | ORAL_TABLET | Freq: Two times a day (BID) | ORAL | 0 refills | Status: DC

## 2019-12-04 MED ORDER — BISOPROLOL FUMARATE 5 MG PO TABS: ORAL_TABLET | ORAL | 0 refills | Status: DC

## 2019-12-04 NOTE — Progress Notes (Signed)
Patient Care Team: [name], DO as PCP - General (Family Medicine) Wellington Hampshire, MD as PCP - Cardiology (Cardiology) Gaye Pollack, MD as Consulting Physician (Cardiothoracic Surgery) Wellington Hampshire, MD as Consulting Physician (Cardiology)   HPI  Brittany Ochoa is a 54 y.o. female Seen in follow-up for monitoring applied for palpitations.  She describes a series of different palpitations syndromes, some awaking her at night, some with her heart pounding, some with her heart racing.  Zio patch was applied and demonstrated sinus rhythm with all of her symptoms with rate ranging from the mid 70s to the mid 110s.  Heart rate averages in the afternoon or frequently above 100 bpm although she says at that time she is mostly at home doing nothing.  She takes lorazepam.  As needed as well as her metoprolol as needed both with some relief.   Records and Results Reviewed  Past Medical History:  Diagnosis Date   Allergy 2015   seasonal   Ascending aortic aneurysm ([place])    a. 06/2018 CTA Chest: 4.1 x 4.0 cm fusiform Asc Ao aneurysm-unchanged.  Descending thoracic aorta 2.1 cm.   Asthma    exercised induced   Dyspnea on exertion    a. 06/2017 echo: EF 60 to 65%.  No regional wall motion abnormalities.  Ascending aortic diameter 3.9 cm.  Mild MR.   Essential hypertension    Multiple thyroid nodules    OCD (obsessive compulsive disorder)    Reflux esophagitis     Past Surgical History:  Procedure Laterality Date   [place]   breast reduction   ESOPHAGOGASTRODUODENOSCOPY (EGD) WITH PROPOFOL N/A 01/24/2018   Procedure: ESOPHAGOGASTRODUODENOSCOPY (EGD) WITH PROPOFOL;  Surgeon: [name], MD;  Location: [facility] ENDOSCOPY;  Service: Endoscopy;  Laterality: N/A;   lipoma removal  2007   back   REDUCTION MAMMAPLASTY Bilateral 12/01/1996    Current Meds  Medication Sig   albuterol  (PROVENTIL HFA;VENTOLIN HFA) 108 (90 Base) MCG/ACT inhaler Inhale 2 puffs into the lungs every 4 (four) hours as needed.   fluticasone (FLONASE) 50 MCG/ACT nasal spray Place 2 sprays into both nostrils daily as needed for allergies or rhinitis.   LORazepam (ATIVAN) 0.5 MG tablet Take 0.5-1 tablets (0.25-0.5 mg total) by mouth daily as needed for anxiety.   losartan (COZAAR) 50 MG tablet Take 1 tablet (50 mg) by mouth once daily   metFORMIN (GLUCOPHAGE-XR) 500 MG 24 hr tablet Take 500 mg by mouth daily.   metoprolol succinate (TOPROL-XL) 25 MG 24 hr tablet Take 1 tablet (25 mg) by mouth once daily as needed    Allergies  Allergen Reactions   Codeine    Medical Provider Ez Flu Shot [Influenza Vac Split Quad] Other (See Comments)    Severe myalgias and bone pain   Quinolones     Aortic enlargement       Review of Systems negative except from HPI and PMH  Physical Exam BP 132/84    Pulse 86    Ht 5\' 3"  (1.6 m)    Wt 193 lb 2 oz (87.6 kg)    BMI 34.21 kg/m  Well developed and well nourished in no acute distress HENT normal E scleral and icterus clear Neck Supple JVP flat; carotids brisk and full Clear to ausculation Regular rate and rhythm, no murmurs gallops or rub Soft with active bowel sounds No clubbing cyanosis  Edema Alert  and oriented, grossly normal motor and sensory function Skin Warm and Dry  ECG      Event recorder reviewed with the patient.  Multiple symptomatic events all associated with sinus rhythm.  Heart rate trends are notable for average heart rates in the afternoon and evening over 100.  She says that she is often sitting when this happens.  No nocturnal tachycardia appreciated  Assessment and  Plan Palpitations  Hypertension-variable  Aortic aneurysm  Weight gain   COVID infection   Treadmill was interesting in her heart rate increase to her 90% level within 1 minute of stage II.  Heart rate did not change appreciably with standing and  so the increase in heart rate with effort reflects either an autonomic issue which I think would be less likely than deconditioning which I think would be more likely.  However, given the monitoring results demonstrating average heart rates in the evening of over 100 during which.  She says she is not active makes me wonder whether there is not an autonomic component.  In this regard, given the fact that she has felt somewhat better when she is taking her Toprol, not withstanding its significant associated fatigue on a regular basis, we will try low-dose beta-blocker to see if we can improve symptoms.  Have given her prescriptions for metoprolol tartrate, bisoprolol 2.5 and Inderal LA 60.  We will reconvene.     Current medicines are reviewed at length with the patient today .  The patient does not  have concerns regarding medicines.

## 2019-12-04 NOTE — Patient Instructions (Addendum)
Medication Instructions:  - Your physician has recommended you make the following change in your medication:   1) STOP metoprolol succinate  2) You are being given prescriptions for 3 different beta blockers to try. You may take them in any order, but DO NOT take more than 1 at a time. Try to give at least 2 weeks on each one to see how your body tolerates this. If you find one that you like, then send a MyChart message and we can refill this for you:  - Bisoprolol 5 mg- take 0.5 tablet (2.5 mg) by mouth once daily - Metoprolol tartrate 25 mg- take 1 tablet by mouth twice daily - Inderal LA 60 mg- take 1 capsule by mouth once daily  *If you need a refill on your cardiac medications before your next appointment, please call your pharmacy*   Lab Work: - none ordered  If you have labs (blood work) drawn today and your tests are completely normal, you will receive your results only by: Marland Kitchen MyChart Message (if you have MyChart) OR . A paper copy in the mail If you have any lab test that is abnormal or we need to change your treatment, we will call you to review the results.   Testing/Procedures: - none ordered   Follow-Up: At [facility], you and your health needs are our priority.  As part of our continuing mission to provide you with exceptional heart care, we have created designated Provider Care Teams.  These Care Teams include your primary Cardiologist (physician) and Advanced Practice Providers (APPs -  Physician Assistants and Nurse Practitioners) who all work together to provide you with the care you need, when you need it.  We recommend signing up for the patient portal called "MyChart".  Sign up information is provided on this After Visit Summary.  MyChart is used to connect with patients for Virtual Visits (Telemedicine).  Patients are able to view lab/test results, encounter notes, upcoming appointments, etc.  Non-urgent messages can be sent to your provider as well.   To learn  more about what you can do with MyChart, go to [email or web address].    Your next appointment:   6/8 week(s)  The format for your next appointment:   Virtual Visit   Provider:   Virl Axe, MD   Other Instructions n/a

## 2019-12-09 ENCOUNTER — Other Ambulatory Visit: Payer: Self-pay

## 2019-12-09 ENCOUNTER — Encounter: Payer: Self-pay | Admitting: Pulmonary Disease

## 2019-12-09 ENCOUNTER — Other Ambulatory Visit: Payer: 59

## 2019-12-09 ENCOUNTER — Ambulatory Visit: Payer: 59 | Admitting: Pulmonary Disease

## 2019-12-09 VITALS — BP 126/74 | HR 68 | Temp 97.1°F | Ht 63.0 in | Wt 199.0 lb

## 2019-12-09 DIAGNOSIS — R0600 Dyspnea, unspecified: Secondary | ICD-10-CM

## 2019-12-09 DIAGNOSIS — G901 Familial dysautonomia [Riley-Day]: Secondary | ICD-10-CM

## 2019-12-09 NOTE — Patient Instructions (Signed)
Your issues seem to be related to dysautonomia.  Keep your appointment at [facility].  Of the beta-blockers that you were given as a choice I would try the bisoprolol (Zebeta) first.  We will see you in follow-up as needed

## 2019-12-09 NOTE — Progress Notes (Signed)
    Assessment & Plan:  1. Dyspnea, unspecified type (Primary)  2. Dysautonomia [facility])   Patient Instructions  Your issues seem to be related to dysautonomia.  Keep your appointment at [facility]  Of the beta-blockers that you were given as a choice I would try the bisoprolol  (Zebeta ) first.  We will see you in follow-up as needed  Please note: late entry documentation due to logistical difficulties during COVID-19 pandemic. This note is filed for information purposes only, and is not intended to be used for billing, nor does it represent the full scope/nature of the visit in question. Please see any associated scanned media linked to date of encounter for additional pertinent information.  Subjective:    HPI: Brittany Ochoa is a 54 y.o. female presenting to the pulmonology clinic on 12/09/2019 with report of: Follow-up (per Dr. Klein--C/O DOE. )     Outpatient Encounter Medications as of 12/09/2019  Medication Sig   [DISCONTINUED] albuterol  (PROVENTIL  HFA;VENTOLIN  HFA) 108 (90 Base) MCG/ACT inhaler Inhale 2 puffs into the lungs every 4 (four) hours as needed. (Patient not taking: Reported on 02/13/2020)   [DISCONTINUED] bisoprolol  (ZEBETA ) 5 MG tablet Take 0.5 tablet (2.5 mg) by mouth once daily   [DISCONTINUED] fluticasone  (FLONASE ) 50 MCG/ACT nasal spray Place 2 sprays into both nostrils daily as needed for allergies or rhinitis.   [DISCONTINUED] LORazepam  (ATIVAN ) 0.5 MG tablet Take 0.5-1 tablets (0.25-0.5 mg total) by mouth daily as needed for anxiety.   [DISCONTINUED] losartan  (COZAAR ) 50 MG tablet Take 1 tablet (50 mg) by mouth once daily   [DISCONTINUED] metFORMIN  (GLUCOPHAGE -XR) 500 MG 24 hr tablet Take 500 mg by mouth daily. (Patient not taking: Reported on 08/25/2020)   [DISCONTINUED] metoprolol  succinate (TOPROL -XL) 25 MG 24 hr tablet Take 1 tablet (25 mg) by mouth once daily as needed   [DISCONTINUED] metoprolol  tartrate (LOPRESSOR ) 25 MG tablet Take 1 tablet (25 mg total) by  mouth 2 (two) times daily.   [DISCONTINUED] propranolol  ER (INDERAL  LA) 60 MG 24 hr capsule Take 1 capsule (60 mg total) by mouth daily.   [DISCONTINUED] Vitamin D , Ergocalciferol , (DRISDOL ) 1.25 MG (50000 UNIT) CAPS capsule Take 1 capsule (50,000 Units total) by mouth every 7 (seven) days.   No facility-administered encounter medications on file as of 12/09/2019.      Objective:   Vitals:   12/09/19 1008  BP: 126/74  Pulse: 68  Temp: (!) 97.1 F (36.2 C)  Height: 5' 3 (1.6 m)  Weight: 199 lb (90.3 kg)  SpO2: 99%  TempSrc: Temporal  BMI (Calculated): 35.26     Physical exam documentation is limited by delayed entry of information.

## 2019-12-12 LAB — EXERCISE TOLERANCE TEST
Estimated workload: 5.8 METS
Exercise duration (min): 4 min
Exercise duration (sec): 1 s
MPHR: 168 {beats}/min
Peak HR: 142 {beats}/min
Percent HR: 84 %
Rest HR: 103 {beats}/min

## 2019-12-15 ENCOUNTER — Encounter: Payer: Self-pay | Admitting: Family Medicine

## 2019-12-16 ENCOUNTER — Encounter: Payer: Self-pay | Admitting: Surgery

## 2019-12-19 ENCOUNTER — Encounter: Payer: Self-pay | Admitting: Surgery

## 2019-12-30 ENCOUNTER — Ambulatory Visit: Payer: 59 | Admitting: Neurology

## 2020-01-15 ENCOUNTER — Other Ambulatory Visit
Admission: RE | Admit: 2020-01-15 | Discharge: 2020-01-15 | Disposition: A | Discharge status: Home / Self Care | Payer: 59 | Attending: Endocrinology | Admitting: Endocrinology

## 2020-01-15 DIAGNOSIS — R000 Tachycardia, unspecified: Secondary | ICD-10-CM | POA: Insufficient documentation

## 2020-01-15 DIAGNOSIS — E049 Nontoxic goiter, unspecified: Secondary | ICD-10-CM | POA: Insufficient documentation

## 2020-01-15 DIAGNOSIS — Z923 Personal history of irradiation: Secondary | ICD-10-CM | POA: Insufficient documentation

## 2020-01-15 DIAGNOSIS — R635 Abnormal weight gain: Secondary | ICD-10-CM | POA: Insufficient documentation

## 2020-01-15 DIAGNOSIS — R7301 Impaired fasting glucose: Secondary | ICD-10-CM | POA: Insufficient documentation

## 2020-01-15 LAB — BASIC METABOLIC PANEL
Anion gap: 12 (ref 5–15)
BUN: 10 mg/dL (ref 6–20)
CO2: 28 mmol/L (ref 22–32)
Calcium: 9.4 mg/dL (ref 8.9–10.3)
Chloride: 99 mmol/L (ref 98–111)
Creatinine, Ser: 0.64 mg/dL (ref 0.44–1.00)
GFR calc Af Amer: >60 mL/min (ref >60)
GFR calc non Af Amer: >60 mL/min (ref >60)
Glucose, Bld: 110 mg/dL — ABNORMAL HIGH (ref 70–99)
Potassium: 4.1 mmol/L (ref 3.5–5.1)
Sodium: 139 mmol/L (ref 135–145)

## 2020-01-15 LAB — HEMOGLOBIN A1C
Hgb A1c MFr Bld: 5.7 % — ABNORMAL HIGH (ref 4.8–5.6)
Mean Plasma Glucose: 116.89 mg/dL

## 2020-01-16 LAB — INSULIN, RANDOM: Insulin: 51 u[IU]/mL — ABNORMAL HIGH (ref 2.6–24.9)

## 2020-01-20 ENCOUNTER — Other Ambulatory Visit: Payer: Self-pay | Admitting: Internal Medicine

## 2020-01-21 ENCOUNTER — Other Ambulatory Visit
Admission: RE | Admit: 2020-01-21 | Discharge: 2020-01-21 | Disposition: A | Discharge status: Home / Self Care | Payer: 59 | Attending: Endocrinology | Admitting: Endocrinology

## 2020-01-21 DIAGNOSIS — R5383 Other fatigue: Secondary | ICD-10-CM | POA: Diagnosis not present

## 2020-01-21 NOTE — Telephone Encounter (Signed)
This is a  pt 

## 2020-01-21 NOTE — Telephone Encounter (Signed)
Do you mind just reaching out to her and ask her if she is currently taking the bisoprolol?   If this is the one she decided she likes the best and feels good on, it is ok to refill.  We could then delete the metoprolol tartrate & propranolol off of her medication list.  Thanks!

## 2020-01-21 NOTE — Telephone Encounter (Signed)
Received a refill request for Bisoprolol, however according the last OV note patient was given 3 different beta blockers. She was to reach back out to the office to let provider know which of those three worked best for her but I do not see any correspondence from the patient addressing this. Please advise if refill is appropriate.

## 2020-01-22 ENCOUNTER — Ambulatory Visit: Payer: 59 | Admitting: Internal Medicine

## 2020-01-22 ENCOUNTER — Other Ambulatory Visit: Payer: Self-pay | Admitting: Endocrinology

## 2020-01-22 DIAGNOSIS — Z923 Personal history of irradiation: Secondary | ICD-10-CM | POA: Diagnosis not present

## 2020-01-22 DIAGNOSIS — R635 Abnormal weight gain: Secondary | ICD-10-CM | POA: Diagnosis not present

## 2020-01-22 DIAGNOSIS — E049 Nontoxic goiter, unspecified: Secondary | ICD-10-CM | POA: Diagnosis not present

## 2020-01-22 DIAGNOSIS — R7301 Impaired fasting glucose: Secondary | ICD-10-CM | POA: Diagnosis not present

## 2020-01-22 DIAGNOSIS — I10 Essential (primary) hypertension: Secondary | ICD-10-CM | POA: Diagnosis not present

## 2020-01-22 DIAGNOSIS — R000 Tachycardia, unspecified: Secondary | ICD-10-CM | POA: Diagnosis not present

## 2020-01-23 ENCOUNTER — Other Ambulatory Visit: Payer: Self-pay | Admitting: Internal Medicine

## 2020-01-23 LAB — LYME DISEASE, WESTERN BLOT
IgG P18 Ab.: ABSENT
IgG P23 Ab.: ABSENT
IgG P28 Ab.: ABSENT
IgG P30 Ab.: ABSENT
IgG P39 Ab.: ABSENT
IgG P41 Ab.: ABSENT
IgG P45 Ab.: ABSENT
IgG P58 Ab.: ABSENT
IgG P66 Ab.: ABSENT
IgG P93 Ab.: ABSENT
IgM P23 Ab.: ABSENT
IgM P39 Ab.: ABSENT
IgM P41 Ab.: ABSENT
Lyme IgG Wb: NEGATIVE
Lyme IgM Wb: NEGATIVE

## 2020-01-23 NOTE — Telephone Encounter (Signed)
Called and spoke with patient, per patient she would like to continue taking the Bisoprolol. She has been taking it for a month and it is low dose. Rx sent to [facility] employee pharmacy per request.

## 2020-02-02 ENCOUNTER — Ambulatory Visit: Payer: 59 | Admitting: Allergy and Immunology

## 2020-02-10 ENCOUNTER — Other Ambulatory Visit: Payer: Self-pay | Admitting: Nurse Practitioner

## 2020-02-10 NOTE — Progress Notes (Signed)
Erroneous error

## 2020-02-13 ENCOUNTER — Ambulatory Visit: Payer: 59 | Admitting: Family Medicine

## 2020-02-13 ENCOUNTER — Encounter: Payer: Self-pay | Admitting: Family Medicine

## 2020-02-13 ENCOUNTER — Other Ambulatory Visit: Payer: Self-pay

## 2020-02-13 VITALS — BP 119/81 | HR 80 | Temp 98.5°F | Wt 192.8 lb

## 2020-02-13 DIAGNOSIS — M436 Torticollis: Secondary | ICD-10-CM

## 2020-02-13 DIAGNOSIS — M25551 Pain in right hip: Secondary | ICD-10-CM

## 2020-02-13 DIAGNOSIS — M25552 Pain in left hip: Secondary | ICD-10-CM

## 2020-02-13 MED ORDER — MELOXICAM 15 MG PO TABS: 15.0000 mg | ORAL_TABLET | Freq: Every day | ORAL | 3 refills | Status: DC

## 2020-02-13 NOTE — Progress Notes (Signed)
BP 119/81   Pulse 80   Temp 98.5 F (36.9 C) (Oral)   Wt 192 lb 12.8 oz (87.5 kg)   SpO2 97%   BMI 34.15 kg/m    Subjective:    Patient ID: Brittany Ochoa, female    DOB: Dec 21, 1965, 54 y.o.   MRN: [id number]  HPI: Brittany Ochoa is a 54 y.o. female  Chief Complaint  Patient presents with  . Pain    pt states she has been having all over pain for the last 3 months. States the pains are shooting and deep like in the bones per patient. States she had 2 family members with bone cancer  . Fatigue    pt states she has been having trouble with her sleep, states she sleeps but does not feel rested when she wakes. States this has been going on for the last 3 months    Continues with pain in her legs and in her hips and pain in her neck. She notes that she has been feeling quite tight.  She feels like she cannot walk due to the pain. She feels like she is forcing herself to do anything. She notes that she feels worse with walking and her pain is in her pelvis. It seems to be in her anterior pelvis and into her hips and her back. She is concerned that she may have bone cancer, as her father passed from it- but he was in his 60s at the time. Pain is aching and tight. Nothing makes it better, and walking makes it worse. It will radiate down her legs. 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 She is otherwise feeling well with no other concerns or complaints at this time.   Relevant past medical, surgical, family and social history reviewed and updated as indicated. Interim medical history since our last visit reviewed. Allergies and medications reviewed and updated.  Review of Systems  Constitutional: Negative.   Respiratory: Negative.   Cardiovascular: Negative.   Musculoskeletal: Positive for arthralgias, back pain, myalgias, neck pain and neck stiffness. Negative for gait problem and joint swelling.  Skin: Negative.   Neurological:  Negative.   Psychiatric/Behavioral: Negative.     Per HPI unless specifically indicated above     Objective:    BP 119/81   Pulse 80   Temp 98.5 F (36.9 C) (Oral)   Wt 192 lb 12.8 oz (87.5 kg)   SpO2 97%   BMI 34.15 kg/m   Wt Readings from Last 3 Encounters:  02/13/20 192 lb 12.8 oz (87.5 kg)  12/09/19 199 lb (90.3 kg)  12/04/19 193 lb 2 oz (87.6 kg)    Physical Exam Vitals and nursing note reviewed.  Constitutional:      General: She is not in acute distress.    Appearance: Normal appearance. She is not ill-appearing, toxic-appearing or diaphoretic.  HENT:     Head: Normocephalic and atraumatic.     Right Ear: External ear normal.     Left Ear: External ear normal.     Nose: Nose normal.     Mouth/Throat:     Mouth: Mucous membranes are moist.     Pharynx: Oropharynx is clear.  Eyes:     General: No scleral icterus.       Right eye: No discharge.        Left eye: No discharge.     Extraocular Movements: Extraocular movements intact.  Conjunctiva/sclera: Conjunctivae normal.     Pupils: Pupils are equal, round, and reactive to light.  Cardiovascular:     Rate and Rhythm: Normal rate and regular rhythm.     Pulses: Normal pulses.     Heart sounds: Normal heart sounds. No murmur heard.  No friction rub. No gallop.   Pulmonary:     Effort: Pulmonary effort is normal. No respiratory distress.     Breath sounds: Normal breath sounds. No stridor. No wheezing, rhonchi or rales.  Chest:     Chest wall: No tenderness.  Musculoskeletal:        General: Normal range of motion.     Cervical back: Normal range of motion and neck supple.  Skin:    General: Skin is warm and dry.     Capillary Refill: Capillary refill takes less than 2 seconds.     Coloration: Skin is not jaundiced or pale.     Findings: No bruising, erythema, lesion or rash.  Neurological:     General: No focal deficit present.     Mental Status: She is alert and oriented to person, place, and time.  Mental status is at baseline.  Psychiatric:        Mood and Affect: Mood normal.        Behavior: Behavior normal.        Thought Content: Thought content normal.        Judgment: Judgment normal.     Results for orders placed or performed during the hospital encounter of 01/21/20  Lyme disease, western blot  Result Value Ref Range     IgG P93 Ab. Absent      IgG P66 Ab. Absent      IgG P58 Ab. Absent      IgG P45 Ab. Absent      IgG P41 Ab. Absent      IgG P39 Ab. Absent      IgG P30 Ab. Absent      IgG P28 Ab. Absent      IgG P23 Ab. Absent      IgG P18 Ab. Absent    Lyme IgG Wb Negative      IgM P41 Ab. Absent      IgM P39 Ab. Absent      IgM P23 Ab. Absent    Lyme IgM Wb Negative       Assessment & Plan:   Problem List Items Addressed This Visit    None    Visit Diagnoses    Hip pain, bilateral    -  Primary   Concern for arthritis. Will check x-rays. Await results. Treat as needed. Call with any concerns. Referral to PT to be considered.    Relevant Orders   DG Lumbar Spine Complete   DG Hip Unilat W OR W/O Pelvis 2-3 Views Left   DG Hip Unilat W OR W/O Pelvis 2-3 Views Right   Stiff neck       Concern for arthritis. Will check x-rays. Await results. Treat as needed. Call with any concerns. Referral to PT to be considered.    Relevant Orders   DG Cervical Spine Complete       Follow up plan: Return if symptoms worsen or fail to improve.

## 2020-02-13 NOTE — Patient Instructions (Signed)
Hip Exercises Ask your health care provider which exercises are safe for you. Do exercises exactly as told by your health care provider and adjust them as directed. It is normal to feel mild stretching, pulling, tightness, or discomfort as you do these exercises. Stop right away if you feel sudden pain or your pain gets worse. Do not begin these exercises until told by your health care provider. Stretching and range-of-motion exercises These exercises warm up your muscles and joints and improve the movement and flexibility of your hip. These exercises also help to relieve pain, numbness, and tingling. You may be asked to limit your range of motion if you had a hip replacement. Talk to your health care provider about these restrictions. Hamstrings, supine  1. Lie on your back (supine position). 2. Loop a belt or towel over the ball of your left / right foot. The ball of your foot is on the walking surface, right under your toes. 3. Straighten your left / right knee and slowly pull on the belt or towel to raise your leg until you feel a gentle stretch behind your knee (hamstring). ? Do not let your knee bend while you do this. ? Keep your other leg flat on the floor. 4. Hold this position for __________ seconds. 5. Slowly return your leg to the starting position. Repeat __________ times. Complete this exercise __________ times a day. Hip rotation  1. Lie on your back on a firm surface. 2. With your left / right hand, gently pull your left / right knee toward the shoulder that is on the same side of the body. Stop when your knee is pointing toward the ceiling. 3. Hold your left / right ankle with your other hand. 4. Keeping your knee steady, gently pull your left / right ankle toward your other shoulder until you feel a stretch in your buttocks. ? Keep your hips and shoulders firmly planted while you do this stretch. 5. Hold this position for __________ seconds. Repeat __________ times. Complete  this exercise __________ times a day. Seated stretch This exercise is sometimes called hamstrings and adductors stretch. 1. Sit on the floor with your legs stretched wide. Keep your knees straight during this exercise. 2. Keeping your head and back in a straight line, bend at your waist to reach for your left foot (position A). You should feel a stretch in your right inner thigh (adductors). 3. Hold this position for __________ seconds. Then slowly return to the upright position. 4. Keeping your head and back in a straight line, bend at your waist to reach forward (position B). You should feel a stretch behind both of your thighs and knees (hamstrings). 5. Hold this position for __________ seconds. Then slowly return to the upright position. 6. Keeping your head and back in a straight line, bend at your waist to reach for your right foot (position C). You should feel a stretch in your left inner thigh (adductors). 7. Hold this position for __________ seconds. Then slowly return to the upright position. Repeat __________ times. Complete this exercise __________ times a day. Lunge This exercise stretches the muscles of the hip (hip flexors). 1. Place your left / right knee on the floor and bend your other knee so that is directly over your ankle. You should be half-kneeling. 2. Keep good posture with your head over your shoulders. 3. Tighten your buttocks to point your tailbone downward. This will prevent your back from arching too much. 4. You should feel a  gentle stretch in the front of your left / right thigh and hip. If you do not feel a stretch, slide your other foot forward slightly and then slowly lunge forward with your chest up until your knee once again lines up over your ankle. ? Make sure your tailbone continues to point downward. 5. Hold this position for __________ seconds. 6. Slowly return to the starting position. Repeat __________ times. Complete this exercise __________ times a  day. Strengthening exercises These exercises build strength and endurance in your hip. Endurance is the ability to use your muscles for a long time, even after they get tired. Bridge This exercise strengthens the muscles of your hip (hip extensors). 1. Lie on your back on a firm surface with your knees bent and your feet flat on the floor. 2. Tighten your buttocks muscles and lift your bottom off the floor until the trunk of your body and your hips are level with your thighs. ? Do not arch your back. ? You should feel the muscles working in your buttocks and the back of your thighs. If you do not feel these muscles, slide your feet 1-2 inches (2.5-5 cm) farther away from your buttocks. 3. Hold this position for __________ seconds. 4. Slowly lower your hips to the starting position. 5. Let your muscles relax completely between repetitions. Repeat __________ times. Complete this exercise __________ times a day. Straight leg raises, side-lying This exercise strengthens the muscles that move the hip joint away from the center of the body (hip abductors). 1. Lie on your side with your left / right leg in the top position. Lie so your head, shoulder, hip, and knee line up. You may bend your bottom knee slightly to help you balance. 2. Roll your hips slightly forward, so your hips are stacked directly over each other and your left / right knee is facing forward. 3. Leading with your heel, lift your top leg 4-6 inches (10-15 cm). You should feel the muscles in your top hip lifting. ? Do not let your foot drift forward. ? Do not let your knee roll toward the ceiling. 4. Hold this position for __________ seconds. 5. Slowly return to the starting position. 6. Let your muscles relax completely between repetitions. Repeat __________ times. Complete this exercise __________ times a day. Straight leg raises, side-lying This exercise strengthens the muscles that move the hip joint toward the center of the  body (hip adductors). 1. Lie on your side with your left / right leg in the bottom position. Lie so your head, shoulder, hip, and knee line up. You may place your upper foot in front to help you balance. 2. Roll your hips slightly forward, so your hips are stacked directly over each other and your left / right knee is facing forward. 3. Tense the muscles in your inner thigh and lift your bottom leg 4-6 inches (10-15 cm). 4. Hold this position for __________ seconds. 5. Slowly return to the starting position. 6. Let your muscles relax completely between repetitions. Repeat __________ times. Complete this exercise __________ times a day. Straight leg raises, supine This exercise strengthens the muscles in the front of your thigh (quadriceps). 1. Lie on your back (supine position) with your left / right leg extended and your other knee bent. 2. Tense the muscles in the front of your left / right thigh. You should see your kneecap slide up or see increased dimpling just above your knee. 3. Keep these muscles tight as you raise your   leg 4-6 inches (10-15 cm) off the floor. Do not let your knee bend. 4. Hold this position for __________ seconds. 5. Keep these muscles tense as you lower your leg. 6. Relax the muscles slowly and completely between repetitions. Repeat __________ times. Complete this exercise __________ times a day. Hip abductors, standing This exercise strengthens the muscles that move the leg and hip joint away from the center of the body (hip abductors). 1. Tie one end of a rubber exercise band or tubing to a secure surface, such as a chair, table, or pole. 2. Loop the other end of the band or tubing around your left / right ankle. 3. Keeping your ankle with the band or tubing directly opposite the secured end, step away until there is tension in the tubing or band. Hold on to a chair, table, or pole as needed for balance. 4. Lift your left / right leg out to your side. While you do  this: ? Keep your back upright. ? Keep your shoulders over your hips. ? Keep your toes pointing forward. ? Make sure to use your hip muscles to slowly lift your leg. Do not tip your body or forcefully lift your leg. 5. Hold this position for __________ seconds. 6. Slowly return to the starting position. Repeat __________ times. Complete this exercise __________ times a day. Squats This exercise strengthens the muscles in the front of your thigh (quadriceps). 1. Stand in a door frame so your feet and knees are in line with the frame. You may place your hands on the frame for balance. 2. Slowly bend your knees and lower your hips like you are going to sit in a chair. ? Keep your lower legs in a straight-up-and-down position. ? Do not let your hips go lower than your knees. ? Do not bend your knees lower than told by your health care provider. ? If your hip pain increases, do not bend as low. 3. Hold this position for ___________ seconds. 4. Slowly push with your legs to return to standing. Do not use your hands to pull yourself to standing. Repeat __________ times. Complete this exercise __________ times a day. This information is not intended to replace advice given to you by your health care provider. Make sure you discuss any questions you have with your health care provider. Document Revised: 11/07/2018 Document Reviewed: 02/12/2018 Elsevier Patient Education  [place] or Strain Rehab Ask your health care provider which exercises are safe for you. Do exercises exactly as told by your health care provider and adjust them as directed. It is normal to feel mild stretching, pulling, tightness, or discomfort as you do these exercises. Stop right away if you feel sudden pain or your pain gets worse. Do not begin these exercises until told by your health care provider. Stretching and range-of-motion exercises These exercises warm up your muscles and joints and improve  the movement and flexibility of your back. These exercises also help to relieve pain, numbness, and tingling. Lumbar rotation  6. Lie on your back on a firm surface and bend your knees. 7. Straighten your arms out to your sides so each arm forms a 90-degree angle (right angle) with a side of your body. 8. Slowly move (rotate) both of your knees to one side of your body until you feel a stretch in your lower back (lumbar). Try not to let your shoulders lift off the floor. 9. Hold this position for __________ seconds. 10. Tense  your abdominal muscles and slowly move your knees back to the starting position. 11. Repeat this exercise on the other side of your body. Repeat __________ times. Complete this exercise __________ times a day. Single knee to chest  6. Lie on your back on a firm surface with both legs straight. 7. Bend one of your knees. Use your hands to move your knee up toward your chest until you feel a gentle stretch in your lower back and buttock. ? Hold your leg in this position by holding on to the front of your knee. ? Keep your other leg as straight as possible. 8. Hold this position for __________ seconds. 9. Slowly return to the starting position. 10. Repeat with your other leg. Repeat __________ times. Complete this exercise __________ times a day. Prone extension on elbows  8. Lie on your abdomen on a firm surface (prone position). 9. Prop yourself up on your elbows. 10. Use your arms to help lift your chest up until you feel a gentle stretch in your abdomen and your lower back. ? This will place some of your body weight on your elbows. If this is uncomfortable, try stacking pillows under your chest. ? Your hips should stay down, against the surface that you are lying on. Keep your hip and back muscles relaxed. 11. Hold this position for __________ seconds. 12. Slowly relax your upper body and return to the starting position. Repeat __________ times. Complete this  exercise __________ times a day. Strengthening exercises These exercises build strength and endurance in your back. Endurance is the ability to use your muscles for a long time, even after they get tired. Pelvic tilt This exercise strengthens the muscles that lie deep in the abdomen. 7. Lie on your back on a firm surface. Bend your knees and keep your feet flat on the floor. 8. Tense your abdominal muscles. Tip your pelvis up toward the ceiling and flatten your lower back into the floor. ? To help with this exercise, you may place a small towel under your lower back and try to push your back into the towel. 9. Hold this position for __________ seconds. 10. Let your muscles relax completely before you repeat this exercise. Repeat __________ times. Complete this exercise __________ times a day. Alternating arm and leg raises  6. Get on your hands and knees on a firm surface. If you are on a hard floor, you may want to use padding, such as an exercise mat, to cushion your knees. 7. Line up your arms and legs. Your hands should be directly below your shoulders, and your knees should be directly below your hips. 8. Lift your left leg behind you. At the same time, raise your right arm and straighten it in front of you. ? Do not lift your leg higher than your hip. ? Do not lift your arm higher than your shoulder. ? Keep your abdominal and back muscles tight. ? Keep your hips facing the ground. ? Do not arch your back. ? Keep your balance carefully, and do not hold your breath. 9. Hold this position for __________ seconds. 10. Slowly return to the starting position. 11. Repeat with your right leg and your left arm. Repeat __________ times. Complete this exercise __________ times a day. Abdominal set with straight leg raise  7. Lie on your back on a firm surface. 8. Bend one of your knees and keep your other leg straight. 9. Tense your abdominal muscles and lift your straight leg up, 4-6  inches  (10-15 cm) off the ground. 10. Keep your abdominal muscles tight and hold this position for __________ seconds. ? Do not hold your breath. ? Do not arch your back. Keep it flat against the ground. 11. Keep your abdominal muscles tense as you slowly lower your leg back to the starting position. 12. Repeat with your other leg. Repeat __________ times. Complete this exercise __________ times a day. Single leg lower with bent knees 7. Lie on your back on a firm surface. 8. Tense your abdominal muscles and lift your feet off the floor, one foot at a time, so your knees and hips are bent in 90-degree angles (right angles). ? Your knees should be over your hips and your lower legs should be parallel to the floor. 9. Keeping your abdominal muscles tense and your knee bent, slowly lower one of your legs so your toe touches the ground. 10. Lift your leg back up to return to the starting position. ? Do not hold your breath. ? Do not let your back arch. Keep your back flat against the ground. 11. Repeat with your other leg. Repeat __________ times. Complete this exercise __________ times a day. Posture and body mechanics Good posture and healthy body mechanics can help to relieve stress in your body's tissues and joints. Body mechanics refers to the movements and positions of your body while you do your daily activities. Posture is part of body mechanics. Good posture means:  Your spine is in its natural S-curve position (neutral).  Your shoulders are pulled back slightly.  Your head is not tipped forward. Follow these guidelines to improve your posture and body mechanics in your everyday activities. Standing   When standing, keep your spine neutral and your feet about hip width apart. Keep a slight bend in your knees. Your ears, shoulders, and hips should line up.  When you do a task in which you stand in one place for a long time, place one foot up on a stable object that is 2-4 inches (5-10 cm)  high, such as a footstool. This helps keep your spine neutral. Sitting   When sitting, keep your spine neutral and keep your feet flat on the floor. Use a footrest, if necessary, and keep your thighs parallel to the floor. Avoid rounding your shoulders, and avoid tilting your head forward.  When working at a desk or a computer, keep your desk at a height where your hands are slightly lower than your elbows. Slide your chair under your desk so you are close enough to maintain good posture.  When working at a computer, place your monitor at a height where you are looking straight ahead and you do not have to tilt your head forward or downward to look at the screen. Resting  When lying down and resting, avoid positions that are most painful for you.  If you have pain with activities such as sitting, bending, stooping, or squatting, lie in a position in which your body does not bend very much. For example, avoid curling up on your side with your arms and knees near your chest (fetal position).  If you have pain with activities such as standing for a long time or reaching with your arms, lie with your spine in a neutral position and bend your knees slightly. Try the following positions: ? Lying on your side with a pillow between your knees. ? Lying on your back with a pillow under your knees. Lifting   When lifting objects,  keep your feet at least shoulder width apart and tighten your abdominal muscles.  Bend your knees and hips and keep your spine neutral. It is important to lift using the strength of your legs, not your back. Do not lock your knees straight out.  Always ask for help to lift heavy or awkward objects. This information is not intended to replace advice given to you by your health care provider. Make sure you discuss any questions you have with your health care provider. Document Revised: 07/26/2018 Document Reviewed: 04/25/2018 Elsevier Patient Education  [place].  Neck Exercises Ask your health care provider which exercises are safe for you. Do exercises exactly as told by your health care provider and adjust them as directed. It is normal to feel mild stretching, pulling, tightness, or discomfort as you do these exercises. Stop right away if you feel sudden pain or your pain gets worse. Do not begin these exercises until told by your health care provider. Neck exercises can be important for many reasons. They can improve strength and maintain flexibility in your neck, which will help your upper back and prevent neck pain. Stretching exercises Rotation neck stretching  1. Sit in a chair or stand up. 2. Place your feet flat on the floor, shoulder width apart. 3. Slowly turn your head (rotate) to the right until a slight stretch is felt. Turn it all the way to the right so you can look over your right shoulder. Do not tilt or tip your head. 4. Hold this position for 10-30 seconds. 5. Slowly turn your head (rotate) to the left until a slight stretch is felt. Turn it all the way to the left so you can look over your left shoulder. Do not tilt or tip your head. 6. Hold this position for 10-30 seconds. Repeat __________ times. Complete this exercise __________ times a day. Neck retraction 1. Sit in a sturdy chair or stand up. 2. Look straight ahead. Do not bend your neck. 3. Use your fingers to push your chin backward (retraction). Do not bend your neck for this movement. Continue to face straight ahead. If you are doing the exercise properly, you will feel a slight sensation in your throat and a stretch at the back of your neck. 4. Hold the stretch for 1-2 seconds. Repeat __________ times. Complete this exercise __________ times a day. Strengthening exercises Neck press 1. Lie on your back on a firm bed or on the floor with a pillow under your head. 2. Use your neck muscles to push your head down on the pillow and straighten your spine. 3. Hold the  position as well as you can. Keep your head facing up (in a neutral position) and your chin tucked. 4. Slowly count to 5 while holding this position. Repeat __________ times. Complete this exercise __________ times a day. Isometrics These are exercises in which you strengthen the muscles in your neck while keeping your neck still (isometrics). 1. Sit in a supportive chair and place your hand on your forehead. 2. Keep your head and face facing straight ahead. Do not flex or extend your neck while doing isometrics. 3. Push forward with your head and neck while pushing back with your hand. Hold for 10 seconds. 4. Do the sequence again, this time putting your hand against the back of your head. Use your head and neck to push backward against the hand pressure. 5. Finally, do the same exercise on either side of your head, pushing sideways  against the pressure of your hand. Repeat __________ times. Complete this exercise __________ times a day. Prone head lifts 1. Lie face-down (prone position), resting on your elbows so that your chest and upper back are raised. 2. Start with your head facing downward, near your chest. Position your chin either on or near your chest. 3. Slowly lift your head upward. Lift until you are looking straight ahead. Then continue lifting your head as far back as you can comfortably stretch. 4. Hold your head up for 5 seconds. Then slowly lower it to your starting position. Repeat __________ times. Complete this exercise __________ times a day. Supine head lifts 1. Lie on your back (supine position), bending your knees to point to the ceiling and keeping your feet flat on the floor. 2. Lift your head slowly off the floor, raising your chin toward your chest. 3. Hold for 5 seconds. Repeat __________ times. Complete this exercise __________ times a day. Scapular retraction 1. Stand with your arms at your sides. Look straight ahead. 2. Slowly pull both shoulders (scapulae)  backward and downward (retraction) until you feel a stretch between your shoulder blades in your upper back. 3. Hold for 10-30 seconds. 4. Relax and repeat. Repeat __________ times. Complete this exercise __________ times a day. Contact a health care provider if:  Your neck pain or discomfort gets much worse when you do an exercise.  Your neck pain or discomfort does not improve within 2 hours after you exercise. If you have any of these problems, stop exercising right away. Do not do the exercises again unless your health care provider says that you can. Get help right away if:  You develop sudden, severe neck pain. If this happens, stop exercising right away. Do not do the exercises again unless your health care provider says that you can. This information is not intended to replace advice given to you by your health care provider. Make sure you discuss any questions you have with your health care provider. Document Revised: 01/30/2018 Document Reviewed: 01/30/2018 Elsevier Patient Education  [place].

## 2020-02-16 ENCOUNTER — Encounter: Payer: Self-pay | Admitting: Family Medicine

## 2020-02-19 ENCOUNTER — Ambulatory Visit
Admission: RE | Admit: 2020-02-19 | Discharge: 2020-02-19 | Disposition: A | Discharge status: Home / Self Care | Payer: 59 | Source: Ambulatory Visit | Attending: Family Medicine | Admitting: Family Medicine

## 2020-02-19 DIAGNOSIS — M47816 Spondylosis without myelopathy or radiculopathy, lumbar region: Secondary | ICD-10-CM | POA: Diagnosis not present

## 2020-02-19 DIAGNOSIS — M25552 Pain in left hip: Secondary | ICD-10-CM | POA: Diagnosis not present

## 2020-02-19 DIAGNOSIS — M25551 Pain in right hip: Secondary | ICD-10-CM | POA: Insufficient documentation

## 2020-02-19 DIAGNOSIS — M2578 Osteophyte, vertebrae: Secondary | ICD-10-CM | POA: Diagnosis not present

## 2020-02-19 DIAGNOSIS — M436 Torticollis: Secondary | ICD-10-CM | POA: Insufficient documentation

## 2020-02-23 ENCOUNTER — Encounter: Payer: Self-pay | Admitting: Family Medicine

## 2020-02-23 ENCOUNTER — Other Ambulatory Visit: Payer: Self-pay | Admitting: Family Medicine

## 2020-02-23 DIAGNOSIS — M5030 Other cervical disc degeneration, unspecified cervical region: Secondary | ICD-10-CM

## 2020-02-23 DIAGNOSIS — M5136 Other intervertebral disc degeneration, lumbar region: Secondary | ICD-10-CM

## 2020-02-24 ENCOUNTER — Telehealth: Payer: Self-pay

## 2020-02-24 NOTE — Telephone Encounter (Signed)
Copied from [place] [phone]. Topic: Referral - Request for Referral >> Feb 24, 2020  9:06 AM Hinda Lenis D wrote: Has patient seen PCP for this complaint? YES  *If NO, is insurance requiring patient see PCP for this issue before PCP can refer them? Referral for which specialty: HIP PAIN MRI  Preferred provider/office: ANY  Reason for referral: HIP PAIN MRI

## 2020-02-24 NOTE — Telephone Encounter (Signed)
There is no indication for an MRI of her hips. 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 I'm happy to refer her to orthopedics early if she'd like to discuss the hip pain

## 2020-02-24 NOTE — Telephone Encounter (Signed)
Called and LVM (verfied DPR) letting patient know what Dr. Wynetta Emery said. Asked for patient to call back and let us know if she would like to go to orthopedics for the hip pain.

## 2020-02-25 ENCOUNTER — Encounter: Payer: Self-pay | Admitting: Family Medicine

## 2020-03-05 ENCOUNTER — Ambulatory Visit: Payer: 59 | Admitting: Family Medicine

## 2020-03-08 ENCOUNTER — Telehealth: Payer: Self-pay

## 2020-03-08 ENCOUNTER — Other Ambulatory Visit: Payer: Self-pay

## 2020-03-08 ENCOUNTER — Ambulatory Visit: Payer: 59

## 2020-03-08 ENCOUNTER — Ambulatory Visit: Admission: RE | Admit: 2020-03-08 | Payer: 59 | Source: Ambulatory Visit

## 2020-03-08 NOTE — Telephone Encounter (Signed)
Routing to provider, FYI.  

## 2020-03-08 NOTE — Telephone Encounter (Signed)
Noted  

## 2020-03-08 NOTE — Telephone Encounter (Signed)
Copied from [place] [phone]. Topic: General - Call Back - No Documentation >> Mar 08, 2020  3:06 PM Erick Blinks wrote: Reason for CRM: Pt called to report that she cannot afford her MRIs, they will cost her over 1K. She has to delay this until she can afford it.  Best contact: [phone]

## 2020-03-21 ENCOUNTER — Ambulatory Visit: Payer: 59

## 2020-03-21 ENCOUNTER — Inpatient Hospital Stay: Admission: RE | Admit: 2020-03-21 | Payer: 59 | Source: Ambulatory Visit

## 2020-04-27 ENCOUNTER — Telehealth: Payer: Self-pay

## 2020-04-27 ENCOUNTER — Encounter: Payer: Self-pay | Admitting: Family Medicine

## 2020-04-27 ENCOUNTER — Telehealth (INDEPENDENT_AMBULATORY_CARE_PROVIDER_SITE_OTHER): Payer: 59 | Admitting: Family Medicine

## 2020-04-27 ENCOUNTER — Other Ambulatory Visit: Payer: Self-pay

## 2020-04-27 ENCOUNTER — Other Ambulatory Visit: Payer: Self-pay | Admitting: Family Medicine

## 2020-04-27 DIAGNOSIS — Z20822 Contact with and (suspected) exposure to covid-19: Secondary | ICD-10-CM | POA: Diagnosis not present

## 2020-04-27 MED ORDER — PREDNISONE 50 MG PO TABS: 50.0000 mg | ORAL_TABLET | Freq: Every day | ORAL | 0 refills | Status: DC

## 2020-04-27 NOTE — Telephone Encounter (Signed)
Copied from [place] [phone]. Topic: General - Other >> Apr 27, 2020 12:11 PM Rainey Pines A wrote: Pt called to inform pcp that her covid test came back negative and she doesn't feel well and wants to know does she need antibiotics and can she take off work tomorrow. Please advise  [phone] Best contact

## 2020-04-27 NOTE — Telephone Encounter (Signed)
No need for antibiotics at this time. If she's still not feeling well, should stay out.

## 2020-04-27 NOTE — Progress Notes (Signed)
There were no vitals taken for this visit.   Subjective:    Patient ID: Brittany Ochoa, female    DOB: 1965-12-30, 55 y.o.   MRN: [id number]  HPI: Brittany Ochoa is a 55 y.o. female  Chief Complaint  Patient presents with  . URI    Pt states she has been feeling a little fatigued and and some post nasal drip since Saturday. Tested for covid yesterday, pending results through [facility]. No vaccines.    UPPER RESPIRATORY TRACT INFECTION,  Duration: 4 days Worst symptom: body aches and fatigue Fever: no, chills Cough: no Shortness of breath: yes Wheezing: no Chest pain: no Chest tightness: yes Chest congestion: no Nasal congestion: yes Runny nose: yes Post nasal drip: yes Sneezing: no Sore throat: yes Swollen glands: no Sinus pressure: no Headache: yes Face pain: no Toothache: no Ear pain: yes  Ear pressure: no  Eyes red/itching:no Eye drainage/crusting: yes  Vomiting: no Rash: no Fatigue: yes Sick contacts: yes Strep contacts: no  Context: better Recurrent sinusitis: no Relief with OTC cold/cough medications: no  Treatments attempted: cold/sinus, mucinex, anti-histamine and antibiotics   Relevant past medical, surgical, family and social history reviewed and updated as indicated. Interim medical history since our last visit reviewed. Allergies and medications reviewed and updated.  Review of Systems  Constitutional: Positive for fatigue. Negative for activity change, appetite change, chills, diaphoresis, fever and unexpected weight change.  HENT: Positive for postnasal drip, rhinorrhea, sore throat and voice change. Negative for congestion, dental problem, drooling, ear discharge, ear pain, facial swelling, hearing loss, mouth sores, nosebleeds, sinus pressure, sinus pain, sneezing, tinnitus and trouble swallowing.   Respiratory: Positive for chest tightness and shortness of breath. Negative for apnea, cough, choking, wheezing and stridor.   Cardiovascular:  Negative.   Psychiatric/Behavioral: Negative.     Per HPI unless specifically indicated above     Objective:    There were no vitals taken for this visit.  Wt Readings from Last 3 Encounters:  02/13/20 192 lb 12.8 oz (87.5 kg)  12/09/19 199 lb (90.3 kg)  12/04/19 193 lb 2 oz (87.6 kg)    Physical Exam Vitals and nursing note reviewed.  Pulmonary:     Effort: Pulmonary effort is normal. No respiratory distress.     Comments: Speaking in full sentences Neurological:     Mental Status: She is alert.  Psychiatric:        Mood and Affect: Mood normal.        Behavior: Behavior normal.        Thought Content: Thought content normal.        Judgment: Judgment normal.     Results for orders placed or performed during the hospital encounter of 01/21/20  Lyme disease, western blot  Result Value Ref Range     IgG P93 Ab. Absent      IgG P66 Ab. Absent      IgG P58 Ab. Absent      IgG P45 Ab. Absent      IgG P41 Ab. Absent      IgG P39 Ab. Absent      IgG P30 Ab. Absent      IgG P28 Ab. Absent      IgG P23 Ab. Absent      IgG P18 Ab. Absent    Lyme IgG Wb Negative      IgM P41 Ab. Absent      IgM P39 Ab. Absent      IgM  P23 Ab. Absent    Lyme IgM Wb Negative       Assessment & Plan:   Problem List Items Addressed This Visit   None   Visit Diagnoses    Suspected COVID-19 virus infection    -  Primary   Awaiting results. Self-quarantine until results are back. Prednisone for SOB. Continue to monitor. Call if getting worse.        Follow up plan: Return if symptoms worsen or fail to improve.    . This visit was completed via telephone due to the restrictions of the COVID-19 pandemic. All issues as above were discussed and addressed but no physical exam was performed. If it was felt that the patient should be evaluated in the office, they were directed there. The patient verbally consented to this visit. Patient was unable to complete an audio/visual visit due to  Lack of equipment. Due to the catastrophic nature of the COVID-19 pandemic, this visit was done through audio contact only. . Location of the patient: home . Location of the provider: home . Those involved with this call:  . Provider: Park Liter, DO . CMA: Yvonna Alanis, [place] . Front Desk/Registration: Jill Side  . Time spent on call: 21 minutes on the phone discussing health concerns. 30 minutes total spent in review of patient's record and preparation of their chart.

## 2020-04-30 ENCOUNTER — Other Ambulatory Visit: Payer: Self-pay | Admitting: Family Medicine

## 2020-04-30 ENCOUNTER — Other Ambulatory Visit: Payer: Self-pay

## 2020-04-30 MED ORDER — DOXYCYCLINE HYCLATE 100 MG PO TABS
100.0000 mg | ORAL_TABLET | Freq: Two times a day (BID) | ORAL | 0 refills | Status: DC
Start: 2020-04-30 — End: 2020-05-11

## 2020-04-30 MED ORDER — DOXYCYCLINE HYCLATE 100 MG PO TABS: 100.0000 mg | ORAL_TABLET | Freq: Two times a day (BID) | ORAL | 0 refills | Status: DC

## 2020-04-30 NOTE — Telephone Encounter (Signed)
Pt seen 1/11. Please advise  Copied from [place] [phone]. Topic: General - Other >> Apr 30, 2020  3:32 PM Yvette Rack wrote: Reason for CRM: Pt stated she is not getting any better. Pt stated she tested negative for Covid and she requests that an antibiotic [facility] to [id number] [place], [place] - [address]

## 2020-04-30 NOTE — Telephone Encounter (Signed)
Called and LVM notifying patient that RX was sent in.   Dr. Lenna Sciara, it was sent to [facility] so patient may not be able to get it since they close at 5:30. [facility]? I t'ed it up for you.

## 2020-04-30 NOTE — Telephone Encounter (Signed)
Doxycycline sent to her pharmacy.

## 2020-05-08 LAB — COLOGUARD: Cologuard: NEGATIVE

## 2020-05-10 ENCOUNTER — Encounter: Payer: Self-pay | Admitting: Family Medicine

## 2020-05-10 ENCOUNTER — Telehealth: Payer: Self-pay | Admitting: Family Medicine

## 2020-05-10 NOTE — Telephone Encounter (Signed)
Pt called stating that she started taking the doxycycline medication. She states however that it is very upsetting to her stomach and is requesting to have something else sent in for her. Pt states that she has not progressed to feeling better. Pt states that she would be willing to set up appt if needed. Please advise.      [place], [place]  [place] [address]  Phone: [phone] Fax: [phone]  Hours: Not open 24 hours

## 2020-05-10 NOTE — Telephone Encounter (Signed)
appt

## 2020-05-11 ENCOUNTER — Telehealth (INDEPENDENT_AMBULATORY_CARE_PROVIDER_SITE_OTHER): Payer: 59 | Admitting: Family Medicine

## 2020-05-11 ENCOUNTER — Other Ambulatory Visit: Payer: Self-pay | Admitting: Family Medicine

## 2020-05-11 ENCOUNTER — Encounter: Payer: Self-pay | Admitting: Family Medicine

## 2020-05-11 DIAGNOSIS — J0140 Acute pansinusitis, unspecified: Secondary | ICD-10-CM | POA: Diagnosis not present

## 2020-05-11 MED ORDER — AMOXICILLIN-POT CLAVULANATE 875-125 MG PO TABS: 1.0000 | ORAL_TABLET | Freq: Two times a day (BID) | ORAL | 0 refills | Status: DC

## 2020-05-11 NOTE — Telephone Encounter (Signed)
Lvm to make this apt. 

## 2020-05-11 NOTE — Patient Instructions (Signed)
It was great to see you!  Our plans for today:  - Take the augmentin for your sinuses. - If you develop difficulty breathing, chest pain, vomiting and unable to hold down fluids, go to the Emergency Department. - I recommend getting vaccinated for COVID once you are healed from your current infection.   Take care and seek immediate care sooner if you develop any concerns.   Dr. Ky Barban

## 2020-05-11 NOTE — Progress Notes (Signed)
Virtual Visit via Video Note  I connected with Nekia Maxham Dawes on 05/11/20 at  4:20 PM EST by a video enabled telemedicine application and verified that I am speaking with the correct person using two identifiers.  Location: Patient: home Provider: [facility]   I discussed the limitations of evaluation and management by telemedicine and the availability of in person appointments. The patient expressed understanding and agreed to proceed.  History of Present Illness:  UPPER RESPIRATORY TRACT INFECTION - symptom onset 2 weeks ago - COVID negative, unvaccinated - did not tolerate doxycycline and unable to finish course, GI side effects. Was better while on it, worse since discontinuing.   Worst symptom: sinus pressure, cough Fever: no Cough: yes, productive of yellow/green sputum Shortness of breath: no Wheezing: no Chest pain: no Chest tightness: no Chest congestion: yes Nasal congestion: yes Post nasal drip: yes Sore throat: yes Sinus pressure: yes Headache: yes Toothache: no Ear pain: no  Ear pressure: no  Vomiting: no Fatigue: yes Context: worse Recurrent sinusitis: no Relief with OTC cold/cough medications: no  Treatments attempted: mucinex, prednisone, doxycycline.   Observations/Objective:  Patient had trouble connecting to video visit, entirety of visit conducted over the phone.  Speaks in full sentences, no respiratory distress.  Assessment and Plan:  Sinusitis Concern for bacterial etiology as now with prolonged sx >2 weeks, intolerant to doxycycline. Will provide augmentin. COVID negative, recommend vaccination. Emergency precautions discussed.     I discussed the assessment and treatment plan with the patient. The patient was provided an opportunity to ask questions and all were answered. The patient agreed with the plan and demonstrated an understanding of the instructions.   The patient was advised to call back or seek an in-person evaluation if the  symptoms worsen or if the condition fails to improve as anticipated.  I provided 10 minutes of non-face-to-face time during this encounter.   Myles Gip, DO

## 2020-05-14 ENCOUNTER — Other Ambulatory Visit: Payer: Self-pay

## 2020-05-14 MED ORDER — AMOXICILLIN-POT CLAVULANATE 875-125 MG PO TABS: 1.0000 | ORAL_TABLET | Freq: Two times a day (BID) | ORAL | 0 refills | Status: AC

## 2020-05-14 NOTE — Telephone Encounter (Signed)
Called and LVM letting patient know that Dr. Wynetta Emery sent in another round of antibiotics.

## 2020-05-14 NOTE — Telephone Encounter (Signed)
Routing to provider to advise.  

## 2020-05-14 NOTE — Telephone Encounter (Signed)
Please advise pt seen 1/25 and 1/11. Last seen by Dr Ky Barban.  Copied from [place] [phone]. Topic: General - Other >> May 14, 2020 11:09 AM Antonieta Iba C wrote: Reason for CRM: pt called in for assistance. Pt says that she seen provider R[facility] and was prescribed amoxicillin-clavulanate (AUGMENTIN) 875-125 MG tablet  3-5 supply. Pt says that she is getting better but still has some congestion. Pt would like to know if provider could supply her with another 3-5 day supply to clear her completely up? Pt would like to have sent to pharmacy below.    Pharmacy: [place], [address]  Phone:  [phone] Fax:  [phone]   [phone]- phone number

## 2020-05-14 NOTE — Telephone Encounter (Signed)
Patient called again to request the medication for Augmentin asap.  [facility] so that it can be filled today asap.  CB# [phone]

## 2020-07-05 ENCOUNTER — Other Ambulatory Visit: Payer: Self-pay | Admitting: Cardiovascular Disease

## 2020-07-15 ENCOUNTER — Other Ambulatory Visit: Payer: Self-pay

## 2020-07-15 ENCOUNTER — Ambulatory Visit (INDEPENDENT_AMBULATORY_CARE_PROVIDER_SITE_OTHER): Payer: 59 | Admitting: Cardiovascular Disease

## 2020-07-15 ENCOUNTER — Encounter: Payer: Self-pay | Admitting: Cardiovascular Disease

## 2020-07-15 VITALS — BP 140/80 | HR 87 | Ht 63.0 in | Wt 191.0 lb

## 2020-07-15 DIAGNOSIS — R002 Palpitations: Secondary | ICD-10-CM | POA: Diagnosis not present

## 2020-07-15 DIAGNOSIS — I10 Essential (primary) hypertension: Secondary | ICD-10-CM

## 2020-07-15 DIAGNOSIS — I7121 Aneurysm of the ascending aorta, without rupture: Secondary | ICD-10-CM

## 2020-07-15 DIAGNOSIS — I712 Thoracic aortic aneurysm, without rupture: Secondary | ICD-10-CM | POA: Diagnosis not present

## 2020-07-15 NOTE — Progress Notes (Signed)
Cardiology Office Note   Date:  07/15/2020   ID:  Brittany Ochoa, DOB 1965-09-01, MRN [id number]  PCP:  [name], DO  Cardiologist:   Kathlyn Sacramento, MD   Chief Complaint  Patient presents with  . Other    6 month F/U      History of Present Illness: Brittany Ochoa is a 55 y.o. female who is here today for follow-up visit regarding palpitations and essential hypertension.  She is a previous smoker but quit in 2006.  She is known to have small ascending aortic aneurysm measuring 4.1 x 4 cm.  She sees Dr. Cyndia Bent on a yearly basis.  She had previous palpitation.  ZIO monitor in September 2020 showed normal sinus rhythm with isolated PACs.  Some of her events correlated with PACs but most of the events did not correlate with arrhythmia.  She had COVID-19 infection in December 2020.  She had chest pain last year and underwent cardiac CTA which showed normal coronary arteries with calcium score of 0.  She had an emergency room visit in June of last year with palpitations and chest pain in the setting of significantly elevated blood pressure at 220/170.  Troponin was normal.  CT chest with unchanged thoracic aortic aneurysm.  She was diagnosed last year with prediabetes and was started on Metformin.  She had recurrent symptoms of chest pain, palpitations and tachycardia and cold feeling.  She had evaluation for hypertension and that included normal cortisol as well as metanephrine/VMA.  She was subsequently seen by Dr. Caryl Comes and had a ZIO monitor done which showed no significant arrhythmia.  Renal artery duplex showed no evidence of renal artery stenosis. She reports improvement in symptoms on bisoprolol and she has been doing better overall.  She does have metoprolol to be used as needed but has not required the medication.  Past Medical History:  Diagnosis Date  . Allergy 2015   seasonal  . Ascending aortic aneurysm ([place])    a. 06/2018 CTA Chest: 4.1 x 4.0 cm fusiform Asc  Ao aneurysm-unchanged.  Descending thoracic aorta 2.1 cm.  . Asthma    exercised induced  . Dyspnea on exertion    a. 06/2017 echo: EF 60 to 65%.  No regional wall motion abnormalities.  Ascending aortic diameter 3.9 cm.  Mild MR.  . Essential hypertension   . Multiple thyroid nodules   . OCD (obsessive compulsive disorder)   . Reflux esophagitis     Past Surgical History:  Procedure Laterality Date  . BREAST REDUCTION SURGERY  1998  . COSMETIC SURGERY  1997   breast reduction  . ESOPHAGOGASTRODUODENOSCOPY (EGD) WITH PROPOFOL N/A 01/24/2018   Procedure: ESOPHAGOGASTRODUODENOSCOPY (EGD) WITH PROPOFOL;  Surgeon: [name], MD;  Location: [facility] ENDOSCOPY;  Service: Endoscopy;  Laterality: N/A;  . lipoma removal  2007   back  . REDUCTION MAMMAPLASTY Bilateral 12/01/1996     Current Outpatient Medications  Medication Sig Dispense Refill  . bisoprolol (ZEBETA) 5 MG tablet TAKE 1/2 TABLET (2.5MG ) BY MOUTH ONCE DAILY 15 tablet 5  . fluticasone (FLONASE) 50 MCG/ACT nasal spray Place into both nostrils.    Marland Kitchen LORazepam (ATIVAN) 0.5 MG tablet Take 0.5-1 tablets (0.25-0.5 mg total) by mouth daily as needed for anxiety. 20 tablet 0  . losartan (COZAAR) 50 MG tablet TAKE 1 TABLET BY MOUTH DAILY. 30 tablet 3  . meloxicam (MOBIC) 15 MG tablet Take 1 tablet (15 mg total) by mouth daily. 30 tablet 3  .  metFORMIN (GLUCOPHAGE-XR) 500 MG 24 hr tablet Take 500 mg by mouth daily.    . metoprolol succinate (TOPROL-XL) 25 MG 24 hr tablet Take 25 mg by mouth daily as needed.      No current facility-administered medications for this visit.    Allergies:   Codeine, Medical provider ez flu shot [influenza vac split quad], and Quinolones    Social History:  The patient  reports that she quit smoking about 16 years ago. Her smoking use included cigarettes. She has a 50.00 pack-year smoking history. She has never used smokeless tobacco. She reports current alcohol use. She reports that she does not  use drugs.   Family History:  The patient's family history includes Alcohol abuse in her maternal uncle; Anxiety disorder in her brother; Arthritis in her maternal grandmother and mother; Asthma in her brother; Breast cancer in her paternal grandmother; Cancer in her father, paternal aunt, paternal grandfather, paternal grandmother, and paternal uncle; Diabetes in her father, mother, and paternal aunt; Heart attack in her father; Heart disease in her father, paternal grandfather, and paternal grandmother; Obesity in her mother; Osteoporosis in her maternal grandmother; Varicose Veins in her maternal grandmother.    ROS:  Please see the history of present illness.   Otherwise, review of systems are positive for none.   All other systems are reviewed and negative.    PHYSICAL EXAM: VS:  BP 140/80 (BP Location: Left Arm, Patient Position: Sitting, Cuff Size: Normal)   Pulse 87   Ht 5\' 3"  (1.6 m)   Wt 191 lb (86.6 kg)   SpO2 98%   BMI 33.83 kg/m  , BMI Body mass index is 33.83 kg/m. GEN: Well nourished, well developed, in no acute distress  HEENT: normal  Neck: no JVD, carotid bruits, or masses Cardiac: RRR; no murmurs, rubs, or gallops,no edema  Respiratory:  clear to auscultation bilaterally, normal work of breathing GI: soft, nontender, nondistended, + BS MS: no deformity or atrophy  Skin: warm and dry, no rash Neuro:  Strength and sensation are intact Psych: euthymic mood, full affect   EKG:  EKG is ordered today. The ekg ordered today demonstrates normal sinus rhythm with low voltage and no significant ST or T wave changes.   Recent Labs: 09/26/2019: ALT 24; Magnesium 2.3; TSH 0.652 10/19/2019: Hemoglobin 12.8; Platelets 273 01/15/2020: BUN 10; Creatinine, Ser 0.64; Potassium 4.1; Sodium 139    Lipid Panel    Component Value Date/Time   CHOL 223 (H) 09/26/2019 1552   TRIG 103 09/26/2019 1552   HDL 72 09/26/2019 1552   LDLCALC 133 (H) 09/26/2019 1552      Wt Readings from  Last 3 Encounters:  07/15/20 191 lb (86.6 kg)  02/13/20 192 lb 12.8 oz (87.5 kg)  12/09/19 199 lb (90.3 kg)       No flowsheet data found.    ASSESSMENT AND PLAN:  1.  History of atypical chest pain: Cardiac CTA last year showed normal coronary arteries with calcium score of 0.  No recurrent symptoms.  2.  Palpitations: She was seen by Dr. Caryl Comes with some component of autonomic dysfunction.  She reports improvement in symptoms with small dose bisoprolol.  3.  Small ascending aortic aneurysm: Followed by Dr. Cyndia Bent.    4.  Essential hypertension: Blood pressure is reasonably controlled on current medications.   Disposition:   FU with me in 12 months  Signed,  Kathlyn Sacramento, MD  07/15/2020 4:22 PM    [facility]

## 2020-07-15 NOTE — Patient Instructions (Signed)

## 2020-07-19 DIAGNOSIS — M5431 Sciatica, right side: Secondary | ICD-10-CM | POA: Diagnosis not present

## 2020-07-19 DIAGNOSIS — M9901 Segmental and somatic dysfunction of cervical region: Secondary | ICD-10-CM | POA: Diagnosis not present

## 2020-07-19 DIAGNOSIS — M542 Cervicalgia: Secondary | ICD-10-CM | POA: Diagnosis not present

## 2020-07-19 DIAGNOSIS — M9903 Segmental and somatic dysfunction of lumbar region: Secondary | ICD-10-CM | POA: Diagnosis not present

## 2020-07-20 ENCOUNTER — Other Ambulatory Visit: Payer: Self-pay

## 2020-07-20 DIAGNOSIS — M9903 Segmental and somatic dysfunction of lumbar region: Secondary | ICD-10-CM | POA: Diagnosis not present

## 2020-07-20 DIAGNOSIS — M9901 Segmental and somatic dysfunction of cervical region: Secondary | ICD-10-CM | POA: Diagnosis not present

## 2020-07-20 DIAGNOSIS — M5431 Sciatica, right side: Secondary | ICD-10-CM | POA: Diagnosis not present

## 2020-07-20 DIAGNOSIS — M542 Cervicalgia: Secondary | ICD-10-CM | POA: Diagnosis not present

## 2020-07-21 ENCOUNTER — Other Ambulatory Visit: Payer: Self-pay | Admitting: Surgery

## 2020-07-21 DIAGNOSIS — I712 Thoracic aortic aneurysm, without rupture, unspecified: Secondary | ICD-10-CM

## 2020-07-21 DIAGNOSIS — M9903 Segmental and somatic dysfunction of lumbar region: Secondary | ICD-10-CM | POA: Diagnosis not present

## 2020-07-21 DIAGNOSIS — M542 Cervicalgia: Secondary | ICD-10-CM | POA: Diagnosis not present

## 2020-07-21 DIAGNOSIS — M5431 Sciatica, right side: Secondary | ICD-10-CM | POA: Diagnosis not present

## 2020-07-21 DIAGNOSIS — M9901 Segmental and somatic dysfunction of cervical region: Secondary | ICD-10-CM | POA: Diagnosis not present

## 2020-07-26 DIAGNOSIS — M9903 Segmental and somatic dysfunction of lumbar region: Secondary | ICD-10-CM | POA: Diagnosis not present

## 2020-07-26 DIAGNOSIS — M542 Cervicalgia: Secondary | ICD-10-CM | POA: Diagnosis not present

## 2020-07-26 DIAGNOSIS — M9901 Segmental and somatic dysfunction of cervical region: Secondary | ICD-10-CM | POA: Diagnosis not present

## 2020-07-26 DIAGNOSIS — M5431 Sciatica, right side: Secondary | ICD-10-CM | POA: Diagnosis not present

## 2020-08-03 ENCOUNTER — Ambulatory Visit: Payer: Self-pay

## 2020-08-03 DIAGNOSIS — M9903 Segmental and somatic dysfunction of lumbar region: Secondary | ICD-10-CM | POA: Diagnosis not present

## 2020-08-03 DIAGNOSIS — M542 Cervicalgia: Secondary | ICD-10-CM | POA: Diagnosis not present

## 2020-08-03 DIAGNOSIS — M9901 Segmental and somatic dysfunction of cervical region: Secondary | ICD-10-CM | POA: Diagnosis not present

## 2020-08-03 DIAGNOSIS — M5431 Sciatica, right side: Secondary | ICD-10-CM | POA: Diagnosis not present

## 2020-08-03 NOTE — Telephone Encounter (Signed)
Pt c/o pain that radiates to her neck, arm, back located beside the right collar bone x 3 weeks.Pt c/o pain with moving arm, sneezing or using arm to roll over in bed. Pt  thought was a pulled muscle. Pt stated that when she sneezes , the pain is 12/10.  Gotten worse-Pt tried Tylenol without effect. Denies SOB or chest pain to the left side of chest or SOB. Pt had to get off phone quickly unable to go over care advice. Appt made for next Tuesday with PCP. Pt requested afternoon appt. Appt made on first availability. Reason for Disposition . [1] Chest pain(s) lasting a few seconds AND [2] persists > 3 days  Answer Assessment - Initial Assessment Questions 1. LOCATION: "Where does it hurt?"       *No Answer* 2. RADIATION: "Does the pain go anywhere else?" (e.g., into neck, jaw, arms, back)     Right side- does back arms neck radiates 3. ONSET: "When did the chest pain begin?" (Minutes, hours or days)     3 weeks ago 4. PATTERN "Does the pain come and go, or has it been constant since it started?"  "Does it get worse with exertion?"     Pain is worse with exertion 5. DURATION: "How long does it last" (e.g., seconds, minutes, hours)     Intermittent based on movement 6. SEVERITY: "How bad is the pain?"  (e.g., Scale 1-10; mild, moderate, or severe)    - MILD (1-3): doesn't interfere with normal activities     - MODERATE (4-7): interferes with normal activities or awakens from sleep    - SEVERE (8-10): excruciating pain, unable to do any normal activities      Severe at times depending activiry sneezin g12/10 7. CARDIAC RISK FACTORS: "Do you have any history of heart problems or risk factors for heart disease?" (e.g., angina, prior heart attack; diabetes, high blood pressure, high cholesterol, smoker, or strong family history of heart disease)    Ascending aortic aneuryms HTN  8. PULMONARY RISK FACTORS: "Do you have any history of lung disease?"  (e.g., blood clots in lung, asthma, emphysema, birth  control pills)    no 9. CAUSE: "What do you think is causing the chest pain?"    Tumor pressing on nerve  10. OTHER SYMPTOMS: "Do you have any other symptoms?" (e.g., dizziness, nausea, vomiting, sweating, fever, difficulty breathing, cough)      Occasional hot flashes 11. PREGNANCY: "Is there any chance you are pregnant?" "When was your last menstrual period?"       No- LMP: 2019  Protocols used: CHEST PAIN-A-AH

## 2020-08-06 DIAGNOSIS — M9901 Segmental and somatic dysfunction of cervical region: Secondary | ICD-10-CM | POA: Diagnosis not present

## 2020-08-06 DIAGNOSIS — M9903 Segmental and somatic dysfunction of lumbar region: Secondary | ICD-10-CM | POA: Diagnosis not present

## 2020-08-06 DIAGNOSIS — M5431 Sciatica, right side: Secondary | ICD-10-CM | POA: Diagnosis not present

## 2020-08-06 DIAGNOSIS — M542 Cervicalgia: Secondary | ICD-10-CM | POA: Diagnosis not present

## 2020-08-09 DIAGNOSIS — M5431 Sciatica, right side: Secondary | ICD-10-CM | POA: Diagnosis not present

## 2020-08-09 DIAGNOSIS — M9901 Segmental and somatic dysfunction of cervical region: Secondary | ICD-10-CM | POA: Diagnosis not present

## 2020-08-09 DIAGNOSIS — M9903 Segmental and somatic dysfunction of lumbar region: Secondary | ICD-10-CM | POA: Diagnosis not present

## 2020-08-09 DIAGNOSIS — M542 Cervicalgia: Secondary | ICD-10-CM | POA: Diagnosis not present

## 2020-08-10 ENCOUNTER — Encounter: Payer: Self-pay | Admitting: Family Medicine

## 2020-08-10 ENCOUNTER — Other Ambulatory Visit: Payer: Self-pay

## 2020-08-10 ENCOUNTER — Ambulatory Visit: Payer: 59 | Admitting: Family Medicine

## 2020-08-10 VITALS — BP 134/87 | HR 88 | Temp 98.0°F | Wt 194.6 lb

## 2020-08-10 DIAGNOSIS — M898X1 Other specified disorders of bone, shoulder: Secondary | ICD-10-CM | POA: Diagnosis not present

## 2020-08-10 DIAGNOSIS — W57XXXA Bitten or stung by nonvenomous insect and other nonvenomous arthropods, initial encounter: Secondary | ICD-10-CM

## 2020-08-10 DIAGNOSIS — R52 Pain, unspecified: Secondary | ICD-10-CM

## 2020-08-10 DIAGNOSIS — R232 Flushing: Secondary | ICD-10-CM

## 2020-08-10 NOTE — Progress Notes (Signed)
BP 134/87   Pulse 88   Temp 98 F (36.7 C)   Wt 194 lb 9.6 oz (88.3 kg)   SpO2 99%   BMI 34.47 kg/m    Subjective:    Patient ID: Brittany Ochoa, female    DOB: [birth year], 55 y.o.   MRN: [id number]  HPI: Brittany Ochoa is a 55 y.o. female  Chief Complaint  Patient presents with  . Pain    Patient states she is having pain near her collar bone, right side. Patient states it started to hurt about a month ago. Patient states she has all over pain, states she recently pulled a tick off of her self a few days ago. Would like to be tested for tick diseases.    She notes that about a month ago she started feeling bad. She notes that her muscles were feeling like jelly. She has been having some pain under her R clavicle. She notes that it hurts when she presses on it. Pain comes and goes. Worse with touching it and lifting her arm. Nothing really seems to make it better, it just seems to come and go. She also notes that she has been having pain in her body. Pain is widespread and constant. She has been seeing a chiropractor and that seems like it's helping her hips but not really helping anything else. She feels like she is moving slower. She notes that she has had several tick bites previously this year and would like to make sure she doesn't have any tick diseases. 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 She is otherwise doing OK with no other concerns or complaints at this time.   Relevant past medical, surgical, family and social history reviewed and updated as indicated. Interim medical history since our last visit reviewed. Allergies and medications reviewed and updated.  Review of Systems  Constitutional: Positive for diaphoresis and fatigue. Negative for activity change, appetite change, chills, fever and unexpected weight change.  Respiratory: Negative.   Cardiovascular: Negative.   Gastrointestinal: Negative.   Musculoskeletal:  Positive for arthralgias, back pain and myalgias. Negative for gait problem, joint swelling, neck pain and neck stiffness.  Skin: Negative.   Neurological: Negative.   Psychiatric/Behavioral: Negative.     Per HPI unless specifically indicated above     Objective:    BP 134/87   Pulse 88   Temp 98 F (36.7 C)   Wt 194 lb 9.6 oz (88.3 kg)   SpO2 99%   BMI 34.47 kg/m   Wt Readings from Last 3 Encounters:  08/10/20 194 lb 9.6 oz (88.3 kg)  07/15/20 191 lb (86.6 kg)  02/13/20 192 lb 12.8 oz (87.5 kg)    Physical Exam Vitals and nursing note reviewed.  Constitutional:      General: She is not in acute distress.    Appearance: Normal appearance. She is not ill-appearing, toxic-appearing or diaphoretic.  HENT:     Head: Normocephalic and atraumatic.     Right Ear: External ear normal.     Left Ear: External ear normal.     Nose: Nose normal.     Mouth/Throat:     Mouth: Mucous membranes are moist.     Pharynx: Oropharynx is clear.  Eyes:     General: No scleral icterus.       Right eye: No discharge.        Left eye: No discharge.     Extraocular Movements:  Extraocular movements intact.     Conjunctiva/sclera: Conjunctivae normal.     Pupils: Pupils are equal, round, and reactive to light.  Cardiovascular:     Rate and Rhythm: Normal rate and regular rhythm.     Pulses: Normal pulses.     Heart sounds: Normal heart sounds. No murmur heard. No friction rub. No gallop.   Pulmonary:     Effort: Pulmonary effort is normal. No respiratory distress.     Breath sounds: Normal breath sounds. No stridor. No wheezing, rhonchi or rales.  Chest:     Chest wall: No tenderness.  Musculoskeletal:        General: Normal range of motion.     Cervical back: Normal range of motion and neck supple.  Skin:    General: Skin is warm and dry.     Capillary Refill: Capillary refill takes less than 2 seconds.     Coloration: Skin is not jaundiced or pale.     Findings: No bruising,  erythema, lesion or rash.  Neurological:     General: No focal deficit present.     Mental Status: She is alert and oriented to person, place, and time. Mental status is at baseline.  Psychiatric:        Mood and Affect: Mood normal.        Behavior: Behavior normal.        Thought Content: Thought content normal.        Judgment: Judgment normal.     Results for orders placed or performed in visit on 05/13/20  Cologuard  Result Value Ref Range   Cologuard Negative Negative      Assessment & Plan:   Problem List Items Addressed This Visit   None   Visit Diagnoses    Body aches    -  Primary   Will check for tick diseases, ?fibro- has failed meds in the past. Await results. Treat as needed.   Relevant Orders   Lyme Ab/Western Blot Reflex   Rocky mtn spotted fvr abs pnl(IgG+IgM)   Babesia microti Antibody Panel   Ehrlichia Antibody Panel   Tick bite, unspecified site, initial encounter       Will check for tick diseases. Await results. Treat as needed.    Relevant Orders   Lyme Ab/Western Blot Reflex   Rocky mtn spotted fvr abs pnl(IgG+IgM)   Babesia microti Antibody Panel   Ehrlichia Antibody Panel   Pain of right clavicle       Likely arthritis. Will get x-ray if wants. Start voltaren. Call with any concerns.    Relevant Orders   DG Clavicle Right   Hot flashes       ?dysautonomia, tick related or due to hormones. Checking labs today. Await results. Treat as needed.    Relevant Orders   LH   [place]   Estradiol       Follow up plan: Return After 10/05/20 for physical.  >30 minutes spent with patient today

## 2020-08-11 DIAGNOSIS — M542 Cervicalgia: Secondary | ICD-10-CM | POA: Diagnosis not present

## 2020-08-11 DIAGNOSIS — M9903 Segmental and somatic dysfunction of lumbar region: Secondary | ICD-10-CM | POA: Diagnosis not present

## 2020-08-11 DIAGNOSIS — M5431 Sciatica, right side: Secondary | ICD-10-CM | POA: Diagnosis not present

## 2020-08-11 DIAGNOSIS — M9901 Segmental and somatic dysfunction of cervical region: Secondary | ICD-10-CM | POA: Diagnosis not present

## 2020-08-11 LAB — LUTEINIZING HORMONE: LH: 37.4 m[IU]/mL

## 2020-08-11 LAB — FOLLICLE STIMULATING HORMONE: FSH: 89.4 m[IU]/mL

## 2020-08-11 LAB — ESTRADIOL: Estradiol: <5 pg/mL

## 2020-08-12 ENCOUNTER — Ambulatory Visit
Admission: RE | Admit: 2020-08-12 | Discharge: 2020-08-12 | Disposition: A | Discharge status: Home / Self Care | Payer: 59 | Source: Ambulatory Visit | Attending: Family Medicine | Admitting: Family Medicine

## 2020-08-12 DIAGNOSIS — M898X1 Other specified disorders of bone, shoulder: Secondary | ICD-10-CM

## 2020-08-12 DIAGNOSIS — M25511 Pain in right shoulder: Secondary | ICD-10-CM | POA: Diagnosis not present

## 2020-08-12 LAB — ROCKY MTN SPOTTED FVR ABS PNL(IGG+IGM)
RMSF IgG: NEGATIVE
RMSF IgM: 0.3 index (ref 0.00–0.89)

## 2020-08-12 LAB — EHRLICHIA ANTIBODY PANEL
E. Chaffeensis (HME) IgM Titer: NEGATIVE
E.Chaffeensis (HME) IgG: NEGATIVE
HGE IgG Titer: NEGATIVE
HGE IgM Titer: NEGATIVE

## 2020-08-12 LAB — BABESIA MICROTI ANTIBODY PANEL
Babesia microti IgG: <1:10 {titer}
Babesia microti IgM: <1:10 {titer}

## 2020-08-12 LAB — LYME DISEASE SEROLOGY W/REFLEX: Lyme Total Antibody EIA: NEGATIVE

## 2020-08-17 DIAGNOSIS — G901 Familial dysautonomia [Riley-Day]: Secondary | ICD-10-CM | POA: Diagnosis not present

## 2020-08-17 DIAGNOSIS — R69 Illness, unspecified: Secondary | ICD-10-CM | POA: Diagnosis not present

## 2020-08-23 ENCOUNTER — Encounter: Payer: Self-pay | Admitting: Family Medicine

## 2020-08-23 DIAGNOSIS — K219 Gastro-esophageal reflux disease without esophagitis: Secondary | ICD-10-CM

## 2020-08-24 ENCOUNTER — Encounter: Payer: Self-pay | Admitting: *Deleted

## 2020-08-25 ENCOUNTER — Other Ambulatory Visit: Payer: Self-pay

## 2020-08-25 ENCOUNTER — Encounter: Payer: Self-pay | Admitting: Gastroenterology

## 2020-08-25 ENCOUNTER — Telehealth: Payer: Self-pay | Admitting: Gastroenterology

## 2020-08-25 ENCOUNTER — Ambulatory Visit (INDEPENDENT_AMBULATORY_CARE_PROVIDER_SITE_OTHER): Payer: 59 | Admitting: Gastroenterology

## 2020-08-25 VITALS — BP 136/89 | HR 90 | Temp 98.4°F | Ht 63.0 in | Wt 195.8 lb

## 2020-08-25 DIAGNOSIS — R1319 Other dysphagia: Secondary | ICD-10-CM

## 2020-08-25 DIAGNOSIS — E049 Nontoxic goiter, unspecified: Secondary | ICD-10-CM | POA: Insufficient documentation

## 2020-08-25 DIAGNOSIS — R000 Tachycardia, unspecified: Secondary | ICD-10-CM | POA: Insufficient documentation

## 2020-08-25 NOTE — Patient Instructions (Signed)
Please give [place] a call once you are able to see your calendar with a date at [phone].

## 2020-08-25 NOTE — Progress Notes (Signed)
Brittany Ochoa [address]  [place], [place] [address]  Main: [phone]  Fax: [phone]   Gastroenterology Consultation  Referring Provider:     [name], DO Primary Care Physician:  [name], DO Reason for Consultation:    GERD        HPI:    Chief complaint: Dysphagia  Brittany Ochoa is a 55 y.o. y/o female referred for consultation & management  by Dr. Wynetta Emery, Megan P, DO.  Patient reports 26-month history of dysphagia to solids.  No nausea or vomiting.  No weight loss.  No altered bowel habits or blood in stool.  No family history of colon cancer.  Has never had a colonoscopy but Cologuard test this year was negative  Past Medical History:  Diagnosis Date  . Allergy 2015   seasonal  . Ascending aortic aneurysm ([place])    a. 06/2018 CTA Chest: 4.1 x 4.0 cm fusiform Asc Ao aneurysm-unchanged.  Descending thoracic aorta 2.1 cm.  . Asthma    exercised induced  . Dyspnea on exertion    a. 06/2017 echo: EF 60 to 65%.  No regional wall motion abnormalities.  Ascending aortic diameter 3.9 cm.  Mild MR.  . Essential hypertension   . Multiple thyroid nodules   . OCD (obsessive compulsive disorder)   . Reflux esophagitis     Past Surgical History:  Procedure Laterality Date  . BREAST REDUCTION SURGERY  1998  . COSMETIC SURGERY  1997   breast reduction  . ESOPHAGOGASTRODUODENOSCOPY (EGD) WITH PROPOFOL N/A 01/24/2018   Procedure: ESOPHAGOGASTRODUODENOSCOPY (EGD) WITH PROPOFOL;  Surgeon: [name], MD;  Location: [facility] ENDOSCOPY;  Service: Endoscopy;  Laterality: N/A;  . lipoma removal  2007   back  . REDUCTION MAMMAPLASTY Bilateral 12/01/1996    Prior to Admission medications   Medication Sig Start Date End Date Taking? Authorizing Provider  losartan (COZAAR) 50 MG tablet TAKE 1 TABLET BY MOUTH DAILY. 07/05/20 07/05/21 Yes Wellington Hampshire, MD  metFORMIN (GLUCOPHAGE-XR) 500 MG 24 hr tablet TAKE 1 TABLET BY MOUTH TWICE  DAILY 01/22/20 01/21/21 Yes Balan, Bindubal, MD  metoprolol succinate (TOPROL-XL) 25 MG 24 hr tablet Take 25 mg by mouth daily as needed.    Yes [provider]  bisoprolol (ZEBETA) 5 MG tablet TAKE 1/2 TABLET (2.5MG ) BY MOUTH ONCE DAILY Patient not taking: Reported on 08/25/2020 01/23/20 01/22/21  [name], MD  fluticasone [facility]) 50 MCG/ACT nasal spray Place into both nostrils. Patient not taking: Reported on 08/25/2020 04/28/20   [provider]  LORazepam (ATIVAN) 0.5 MG tablet Take 0.5-1 tablets (0.25-0.5 mg total) by mouth daily as needed for anxiety. Patient not taking: Reported on 08/25/2020 10/29/19   Park Liter P, DO  meloxicam (MOBIC) 15 MG tablet Take 1 tablet (15 mg total) by mouth daily. Patient not taking: Reported on 08/25/2020 02/13/20   Park Liter P, DO  metoprolol tartrate (LOPRESSOR) 25 MG tablet Take 1 tablet (25 mg total) by mouth 2 (two) times daily. 12/04/19 02/13/20  [name], MD    Family History  Problem Relation Age of Onset  . Diabetes Mother   . Arthritis Mother   . Obesity Mother   . Diabetes Father   . Heart disease Father   . Heart attack Father   . Cancer Father        bone  . Heart disease Paternal Grandmother   . Cancer Paternal Grandmother  bone  . Breast cancer Paternal Grandmother   . Cancer Paternal Grandfather        pancreatic?  Marland Kitchen Heart disease Paternal Grandfather   . Osteoporosis Maternal Grandmother   . Arthritis Maternal Grandmother   . Varicose Veins Maternal Grandmother   . Alcohol abuse Maternal Uncle   . Asthma Brother   . Anxiety disorder Brother   . Cancer Paternal Aunt   . Diabetes Paternal Aunt   . Cancer Paternal Uncle      Social History   Tobacco Use  . Smoking status: Former Smoker    Packs/day: 2.00    Years: 25.00    Pack years: 50.00    Types: Cigarettes    Quit date: 04/17/2004    Years since quitting: 16.3  . Smokeless tobacco: Never Used  Vaping Use  . Vaping Use:  Never used  Substance Use Topics  . Alcohol use: Yes    Alcohol/week: 0.0 standard drinks    Comment: socially  . Drug use: No    Comment: past only    Allergies as of 08/25/2020 - Review Complete 08/25/2020  Allergen Reaction Noted  . Codeine  08/18/2015  . Medical provider ez flu shot [influenza vac split quad] Other (See Comments) 05/27/2019  . Other Other (See Comments) 01/15/2017  . Quinolones  10/30/2019    Review of Systems:    All systems reviewed and negative except where noted in HPI.   Physical Exam:  BP 136/89   Pulse 90   Temp 98.4 F (36.9 C) (Oral)   Ht 5\' 3"  (1.6 m)   Wt 195 lb 12.8 oz (88.8 kg)   BMI 34.68 kg/m  No LMP recorded. Patient is premenopausal. Psych:  Alert and cooperative. Normal mood and affect. General:   Alert,  Well-developed, well-nourished, pleasant and cooperative in NAD Head:  Normocephalic and atraumatic. Eyes:  Sclera clear, no icterus.   Conjunctiva pink. Ears:  Normal auditory acuity. Nose:  No deformity, discharge, or lesions. Mouth:  No deformity or lesions,oropharynx pink & moist. Neck:  Supple; no masses or thyromegaly. Abdomen:  Normal bowel sounds.  No bruits.  Soft, non-tender and non-distended without masses, hepatosplenomegaly or hernias noted.  No guarding or rebound tenderness.    Msk:  Symmetrical without gross deformities. Good, equal movement & strength bilaterally. Pulses:  Normal pulses noted. Extremities:  No clubbing or edema.  No cyanosis. Neurologic:  Alert and oriented x3;  grossly normal neurologically. Skin:  Intact without significant lesions or rashes. No jaundice. Lymph Nodes:  No significant cervical adenopathy. Psych:  Alert and cooperative. Normal mood and affect.   Labs: CBC    Component Value Date/Time   WBC 6.7 10/19/2019 0006   RBC 4.19 10/19/2019 0006   HGB 12.8 10/19/2019 0006   HGB 14.7 09/26/2019 1552   HCT 38.4 10/19/2019 0006   HCT 44.0 09/26/2019 1552   PLT 273 10/19/2019 0006    PLT 280 09/26/2019 1552   MCV 91.6 10/19/2019 0006   MCV 91 09/26/2019 1552   MCH 30.5 10/19/2019 0006   MCHC 33.3 10/19/2019 0006   RDW 12.9 10/19/2019 0006   RDW 13.1 09/26/2019 1552   LYMPHSABS 1.7 09/26/2019 1552   EOSABS 0.1 09/26/2019 1552   BASOSABS 0.1 09/26/2019 1552   CMP     Component Value Date/Time   NA 139 01/15/2020 1415   NA 144 09/26/2019 1552   K 4.1 01/15/2020 1415   CL 99 01/15/2020 1415   CO2 28 01/15/2020  1415   GLUCOSE 110 (H) 01/15/2020 1415   BUN 10 01/15/2020 1415   BUN 14 09/26/2019 1552   CREATININE 0.64 01/15/2020 1415   CALCIUM 9.4 01/15/2020 1415   PROT 7.1 09/26/2019 1552   ALBUMIN 4.7 09/26/2019 1552   AST 25 09/26/2019 1552   ALT 24 09/26/2019 1552   ALKPHOS 95 09/26/2019 1552   BILITOT 0.5 09/26/2019 1552   GFRNONAA >60 01/15/2020 1415   GFRAA >60 01/15/2020 1415    Imaging Studies: DG Clavicle Right  Result Date: 08/13/2020 CLINICAL DATA:  Right clavicle pain. EXAM: RIGHT CLAVICLE - 2+ VIEWS COMPARISON:  None. FINDINGS: There is no evidence of fracture or other focal bone lesions. Soft tissues are unremarkable. IMPRESSION: Negative. Electronically Signed   By: Constance Holster M.D.   On: 08/13/2020 16:30    Assessment and Plan:   Brittany Ochoa is a 55 y.o. y/o female has been referred for dysphagia  EGD indicated for further evaluation of dysphagia, evaluation of Schatzki's ring and dilation if necessary  Patient following up with PCP for colorectal cancer screening with Cologuard with latest one done in January 2022  Patient states she will call us back after getting to work patient wants to look at her calendar to see what day she can schedule her procedure  I have discussed alternative options, risks & benefits,  which include, but are not limited to, bleeding, infection, perforation,respiratory complication & drug reaction.  The patient agrees with this plan & written consent will be obtained.      Dr Brittany Ochoa  Speech recognition software was used to dictate the above note.

## 2020-08-25 NOTE — Telephone Encounter (Signed)
Patient called to let you know these dates would work for EGD 09/09/20, 09/15/20, 09/16/20  Please call to schedule

## 2020-08-25 NOTE — Telephone Encounter (Signed)
Called patient back and I left her a detailed message letting her know that I was going to put her in the schedule to have her EGD on 09/09/2020 at [facility]. I also left her the number to call the endoscopy unit [phone]) the day before between 1-3 PM. I will also send her a message through [place] for her instructions.

## 2020-09-01 ENCOUNTER — Ambulatory Visit: Payer: 59 | Admitting: Surgery

## 2020-09-01 ENCOUNTER — Inpatient Hospital Stay: Admission: RE | Admit: 2020-09-01 | Payer: 59 | Source: Ambulatory Visit

## 2020-09-01 IMAGING — CT CT ANGIOGRAPHY CHEST
2 of 6 series · 13 of 36 positions shown · IV contrast (iopamidol)
Comparison: 04/29/2017

CLINICAL DATA: Follow-up thoracic aortic aneurysm

EXAM:
CT ANGIOGRAPHY CHEST WITH CONTRAST
TECHNIQUE: Multidetector CT imaging of the chest was performed using the
standard protocol during bolus administration of intravenous
contrast. Multiplanar CT image reconstructions and MIPs were
obtained to evaluate the vascular anatomy.
CONTRAST:  75mL IC24WO-PFV IOPAMIDOL (IC24WO-PFV) INJECTION 76%

[Series 5: cta thorax 2.00 bv36 s3 ax axial arterial · axial · arterial · 0.60mm/px · z∈[+1601,+1849]mm · 12 of 148 slices shown]
[im 12/148  lung]
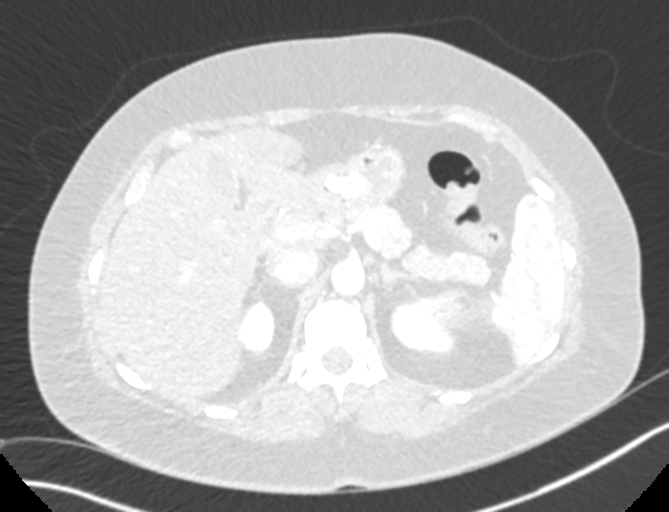
[im 23/148  mediastinal]
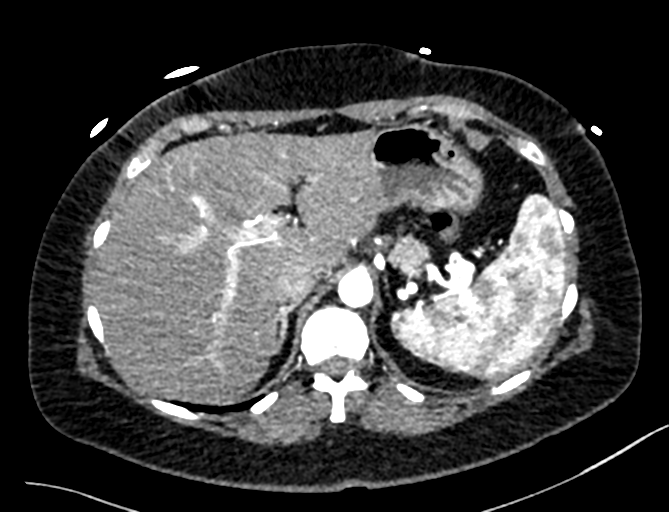
[im 34/148  lung]
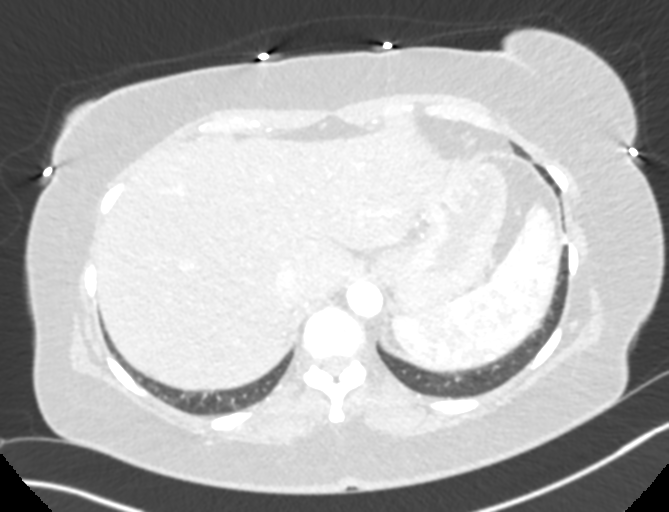
[im 46/148  mediastinal]
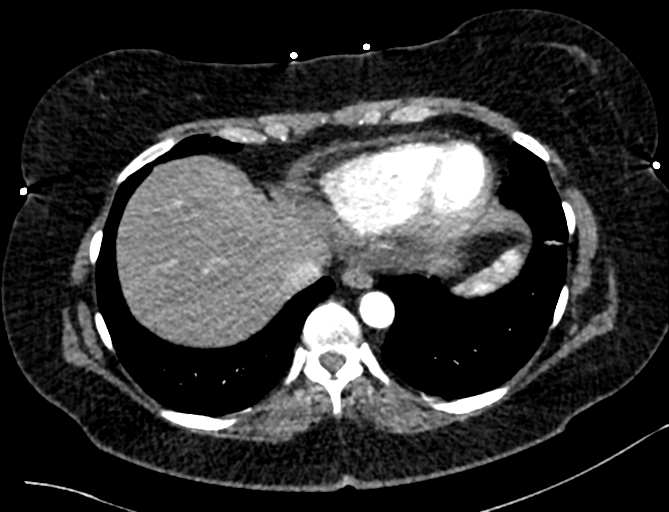
[im 57/148  lung]
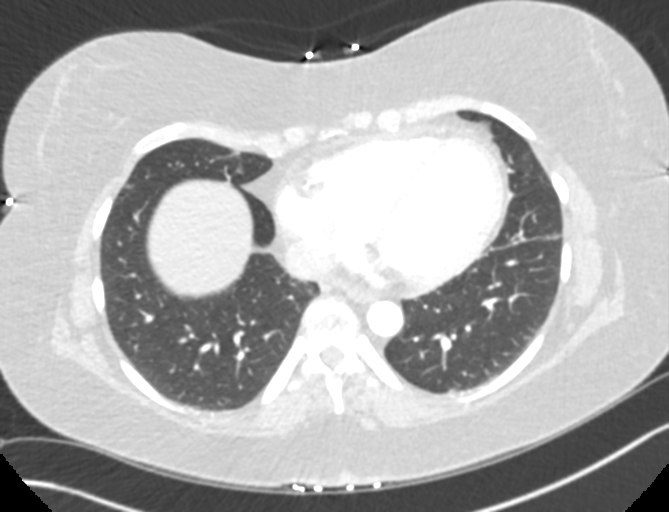
[im 68/148  mediastinal]
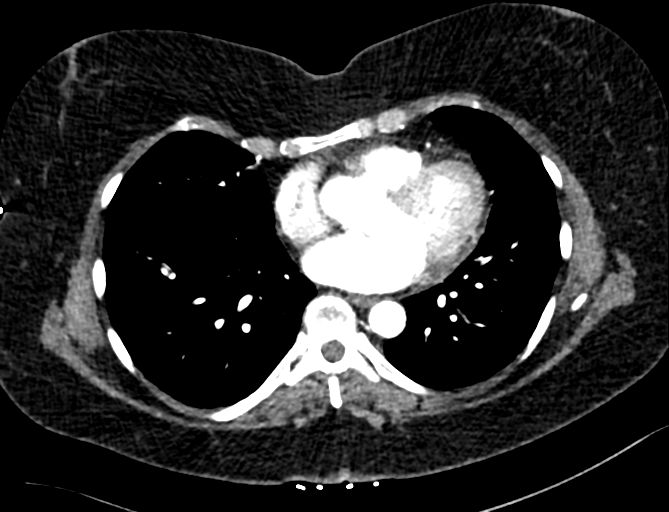
[im 80/148  lung]
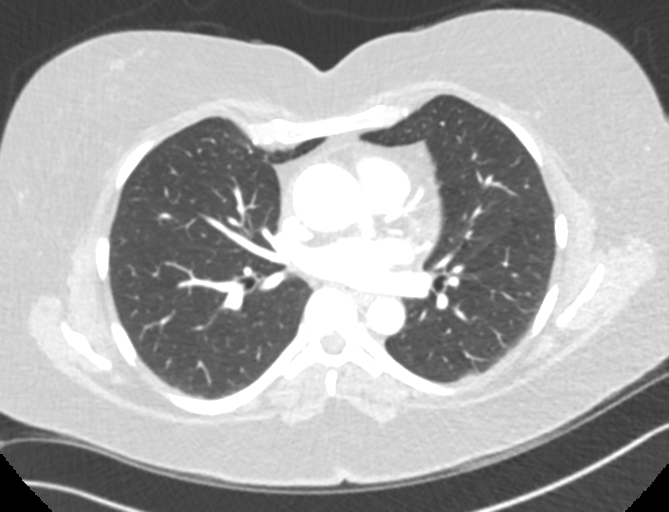
[im 91/148  mediastinal]
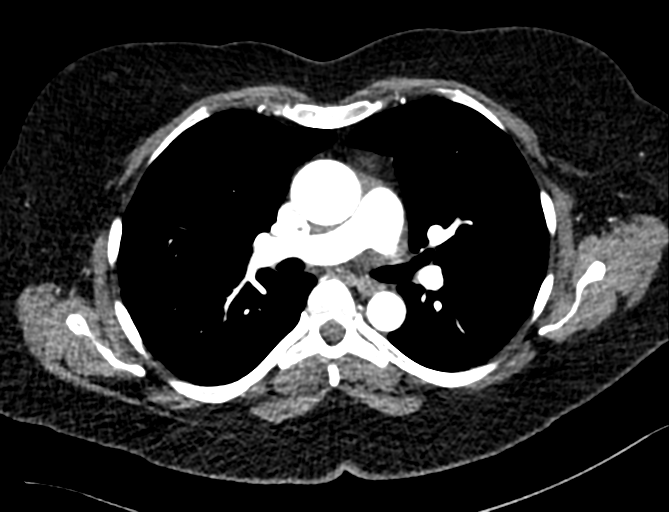
[im 102/148  lung]
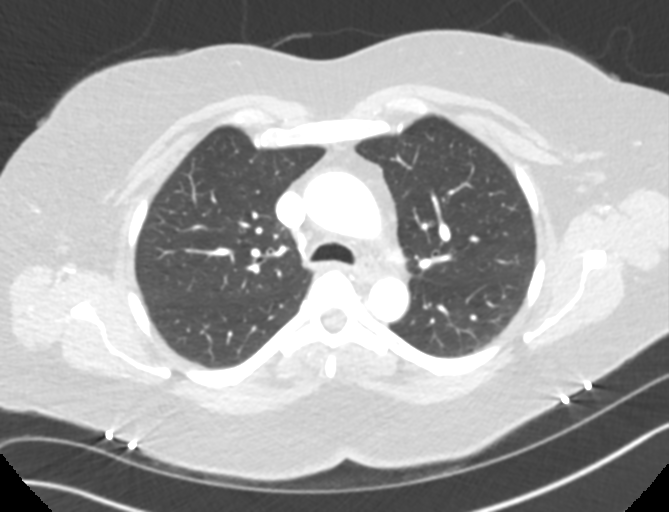
[im 114/148  mediastinal]
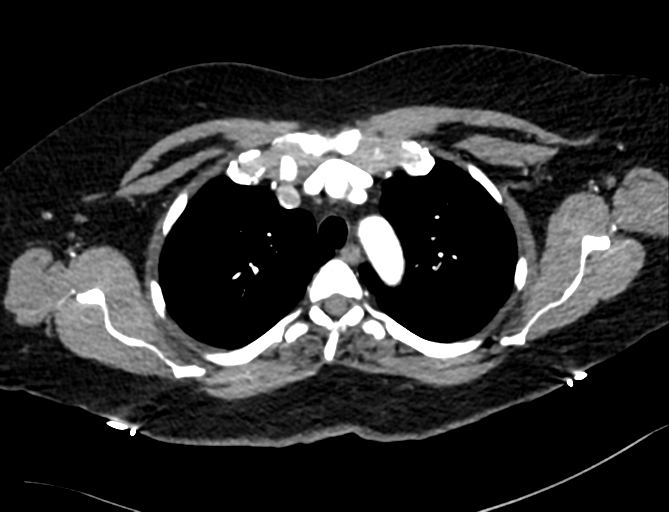
[im 125/148  lung]
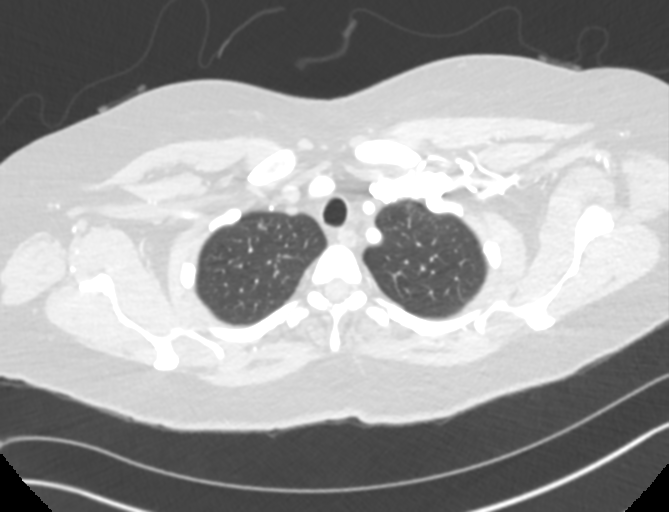
[im 136/148  mediastinal]
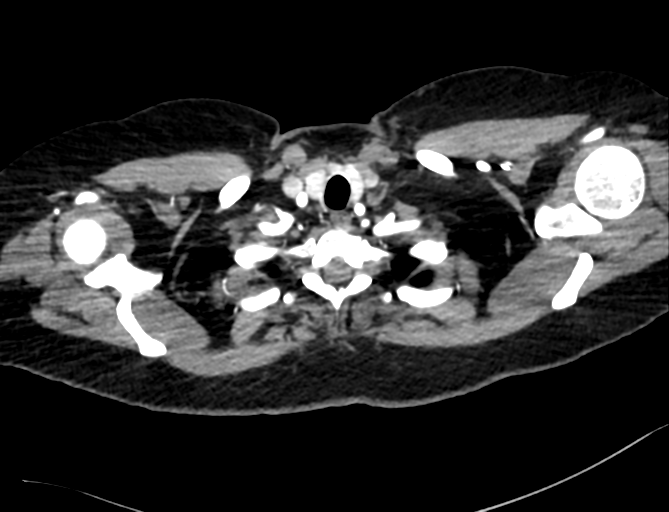

[Series 10: cta thorax 2.00 bv36 s3 cor cor st · coronal · 0.58mm/px · 1 of 147 slices shown]
[im 74/147  mediastinal]
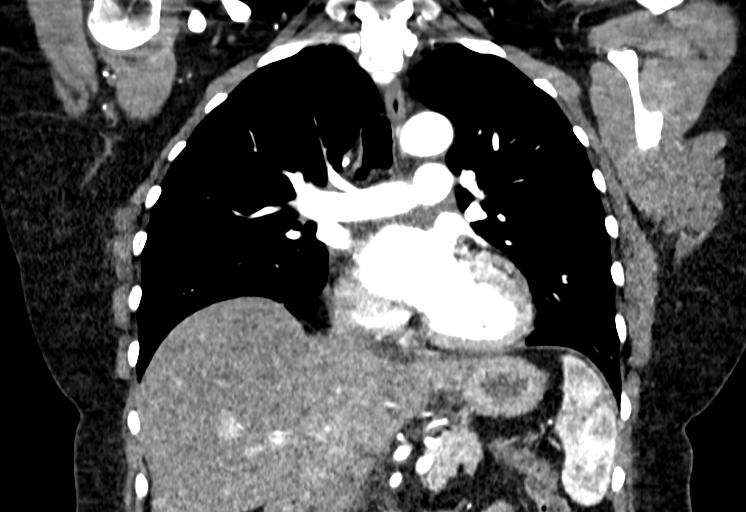

[13 of 36 positions shown; findings below may reference images not displayed]

FINDINGS: Cardiovascular: The tubular thoracic aorta measures 4.1 x 4.0 cm,
unchanged from prior. Normal heart size. No pericardial effusion.

Mediastinum/Nodes: No enlarged mediastinal, hilar, or axillary lymph
nodes. Thyroid gland, trachea, and esophagus demonstrate no
significant findings.

Lungs/Pleura: Stable small pulmonary nodule of the right apex,
consistent with benign sequelae of infection or inflammation.
Bandlike scarring or atelectasis of the bilateral lung bases. No
pleural effusion or pneumothorax.

Upper Abdomen: No acute abnormality.

Musculoskeletal: No chest wall abnormality. No acute or significant
osseous findings.

Review of the MIP images confirms the above findings.
IMPRESSION: The tubular thoracic aorta measures 4.1 x 4.0 cm, unchanged from
prior.

## 2020-09-08 ENCOUNTER — Encounter: Payer: Self-pay | Admitting: Gastroenterology

## 2020-09-09 ENCOUNTER — Ambulatory Visit: Payer: 59 | Admitting: Certified Registered"

## 2020-09-09 ENCOUNTER — Other Ambulatory Visit: Payer: Self-pay

## 2020-09-09 ENCOUNTER — Encounter
Admission: RE | Disposition: A | Discharge status: Home / Self Care | Payer: Self-pay | Source: Home / Self Care | Attending: Gastroenterology

## 2020-09-09 ENCOUNTER — Encounter: Payer: Self-pay | Admitting: Gastroenterology

## 2020-09-09 ENCOUNTER — Ambulatory Visit
Admission: RE | Admit: 2020-09-09 | Discharge: 2020-09-09 | Disposition: A | Discharge status: Home / Self Care | Payer: 59 | Attending: Gastroenterology | Admitting: Gastroenterology

## 2020-09-09 DIAGNOSIS — Z87891 Personal history of nicotine dependence: Secondary | ICD-10-CM | POA: Diagnosis not present

## 2020-09-09 DIAGNOSIS — Z833 Family history of diabetes mellitus: Secondary | ICD-10-CM | POA: Diagnosis not present

## 2020-09-09 DIAGNOSIS — Z79899 Other long term (current) drug therapy: Secondary | ICD-10-CM | POA: Insufficient documentation

## 2020-09-09 DIAGNOSIS — K2289 Other specified disease of esophagus: Secondary | ICD-10-CM | POA: Insufficient documentation

## 2020-09-09 DIAGNOSIS — I10 Essential (primary) hypertension: Secondary | ICD-10-CM | POA: Diagnosis not present

## 2020-09-09 DIAGNOSIS — Z885 Allergy status to narcotic agent status: Secondary | ICD-10-CM | POA: Insufficient documentation

## 2020-09-09 DIAGNOSIS — Z881 Allergy status to other antibiotic agents status: Secondary | ICD-10-CM | POA: Insufficient documentation

## 2020-09-09 DIAGNOSIS — R1310 Dysphagia, unspecified: Secondary | ICD-10-CM | POA: Insufficient documentation

## 2020-09-09 DIAGNOSIS — R1314 Dysphagia, pharyngoesophageal phase: Secondary | ICD-10-CM | POA: Diagnosis not present

## 2020-09-09 DIAGNOSIS — Z887 Allergy status to serum and vaccine status: Secondary | ICD-10-CM | POA: Insufficient documentation

## 2020-09-09 DIAGNOSIS — Z808 Family history of malignant neoplasm of other organs or systems: Secondary | ICD-10-CM | POA: Insufficient documentation

## 2020-09-09 DIAGNOSIS — Z8261 Family history of arthritis: Secondary | ICD-10-CM | POA: Insufficient documentation

## 2020-09-09 DIAGNOSIS — K319 Disease of stomach and duodenum, unspecified: Secondary | ICD-10-CM | POA: Diagnosis not present

## 2020-09-09 DIAGNOSIS — Z8262 Family history of osteoporosis: Secondary | ICD-10-CM | POA: Insufficient documentation

## 2020-09-09 DIAGNOSIS — L538 Other specified erythematous conditions: Secondary | ICD-10-CM | POA: Diagnosis not present

## 2020-09-09 DIAGNOSIS — Z8349 Family history of other endocrine, nutritional and metabolic diseases: Secondary | ICD-10-CM | POA: Diagnosis not present

## 2020-09-09 DIAGNOSIS — L539 Erythematous condition, unspecified: Secondary | ICD-10-CM | POA: Diagnosis not present

## 2020-09-09 DIAGNOSIS — Z7984 Long term (current) use of oral hypoglycemic drugs: Secondary | ICD-10-CM | POA: Diagnosis not present

## 2020-09-09 DIAGNOSIS — K31A19 Gastric intestinal metaplasia without dysplasia, unspecified site: Secondary | ICD-10-CM | POA: Insufficient documentation

## 2020-09-09 DIAGNOSIS — R1319 Other dysphagia: Secondary | ICD-10-CM | POA: Diagnosis not present

## 2020-09-09 DIAGNOSIS — Z8249 Family history of ischemic heart disease and other diseases of the circulatory system: Secondary | ICD-10-CM | POA: Insufficient documentation

## 2020-09-09 DIAGNOSIS — Z803 Family history of malignant neoplasm of breast: Secondary | ICD-10-CM | POA: Diagnosis not present

## 2020-09-09 HISTORY — PX: ESOPHAGOGASTRODUODENOSCOPY (EGD) WITH PROPOFOL: SHX5813

## 2020-09-09 LAB — KOH PREP
KOH Prep: NONE SEEN
Special Requests: NORMAL

## 2020-09-09 LAB — POCT PREGNANCY, URINE: Preg Test, Ur: NEGATIVE

## 2020-09-09 SURGERY — ESOPHAGOGASTRODUODENOSCOPY (EGD) WITH PROPOFOL
Anesthesia: General

## 2020-09-09 MED ORDER — MIDAZOLAM HCL 5 MG/5ML IJ SOLN
INTRAMUSCULAR | Status: DC | PRN
  Administered 2020-09-09: 2 mg via INTRAVENOUS

## 2020-09-09 MED ORDER — MIDAZOLAM HCL 2 MG/2ML IJ SOLN
INTRAMUSCULAR | Status: AC
  Filled 2020-09-09: qty 2

## 2020-09-09 MED ORDER — PROPOFOL 500 MG/50ML IV EMUL
INTRAVENOUS | Status: DC | PRN
  Administered 2020-09-09: 120 ug/kg/min via INTRAVENOUS

## 2020-09-09 MED ORDER — LIDOCAINE 2% (20 MG/ML) 5 ML SYRINGE
INTRAMUSCULAR | Status: DC | PRN
  Administered 2020-09-09: 25 mg via INTRAVENOUS

## 2020-09-09 MED ORDER — PROPOFOL 10 MG/ML IV BOLUS
INTRAVENOUS | Status: DC | PRN
  Administered 2020-09-09: 30 mg via INTRAVENOUS
  Administered 2020-09-09: 70 mg via INTRAVENOUS

## 2020-09-09 MED ORDER — GLYCOPYRROLATE 0.2 MG/ML IJ SOLN
INTRAMUSCULAR | Status: AC
  Filled 2020-09-09: qty 1

## 2020-09-09 MED ORDER — SODIUM CHLORIDE 0.9 % IV SOLN
INTRAVENOUS | Status: DC
Start: 2020-09-09 — End: 2020-09-09

## 2020-09-09 MED ORDER — GLYCOPYRROLATE 0.2 MG/ML IJ SOLN
INTRAMUSCULAR | Status: DC | PRN
  Administered 2020-09-09: .2 mg via INTRAVENOUS

## 2020-09-09 NOTE — Transfer of Care (Signed)
Immediate Anesthesia Transfer of Care Note  Patient: Brittany Ochoa  Procedure(s) Performed: ESOPHAGOGASTRODUODENOSCOPY (EGD) WITH PROPOFOL (N/A )  Patient Location: Endoscopy Unit  Anesthesia Type:General  Level of Consciousness: awake  Airway & Oxygen Therapy: Patient Spontanous Breathing and Patient connected to nasal cannula oxygen  Post-op Assessment: Report given to RN and Post -op Vital signs reviewed and stable  Post vital signs: Reviewed  Last Vitals:  Vitals Value Taken Time  BP 142/87 09/09/20 1007  Temp    Pulse 103 09/09/20 1007  Resp 16 09/09/20 1007  SpO2 95 % 09/09/20 1007  Vitals shown include unvalidated device data.  Last Pain:  Vitals:   09/09/20 0925  TempSrc: Temporal  PainSc: 0-No pain         Complications: No complications documented.

## 2020-09-09 NOTE — Anesthesia Postprocedure Evaluation (Signed)
Anesthesia Post Note  Patient: Brittany Ochoa  Procedure(s) Performed: ESOPHAGOGASTRODUODENOSCOPY (EGD) WITH PROPOFOL (N/A )  Patient location during evaluation: Endoscopy Anesthesia Type: General Level of consciousness: awake and alert Pain management: pain level controlled Vital Signs Assessment: post-procedure vital signs reviewed and stable Respiratory status: spontaneous breathing, nonlabored ventilation, respiratory function stable and patient connected to nasal cannula oxygen Cardiovascular status: blood pressure returned to baseline and stable Postop Assessment: no apparent nausea or vomiting Anesthetic complications: no   No complications documented.   Last Vitals:  Vitals:   09/09/20 1010 09/09/20 1020  BP: (!) 142/87 (!) 129/96  Pulse: 92 76  Resp: 17 15  Temp:    SpO2: 98% 100%    Last Pain:  Vitals:   09/09/20 1000  TempSrc: Temporal  PainSc:                  Precious Haws Stony Stegmann

## 2020-09-09 NOTE — Op Note (Signed)
[facility] Gastroenterology Patient Name: Brittany Ochoa Procedure Date: 09/09/2020 9:42 AM MRN: [id number] Account #: [id number] Date of Birth: 03/23/1966 Admit Type: Outpatient Age: 55 Room: [facility] ENDO ROOM 2 Gender: Female Note Status: Finalized Procedure:             Upper GI endoscopy Indications:           Dysphagia Providers:             Stellarose Cerny B. Bonna Gains MD, MD Referring MD:          [name] (Referring MD) Medicines:             Monitored Anesthesia Care Complications:         No immediate complications. Procedure:             Pre-Anesthesia Assessment:                        - Prior to the procedure, a History and Physical was                         performed, and patient medications, allergies and                         sensitivities were reviewed. The patient's tolerance                         of previous anesthesia was reviewed.                        - The risks and benefits of the procedure and the                         sedation options and risks were discussed with the                         patient. All questions were answered and informed                         consent was obtained.                        - Patient identification and proposed procedure were                         verified prior to the procedure by the physician, the                         nurse, the anesthesiologist, the anesthetist and the                         technician. The procedure was verified in the                         procedure room.                        - ASA Grade Assessment: II - A patient with mild                         systemic disease.  After obtaining informed consent, the endoscope was                         passed under direct vision. Throughout the procedure,                         the patient's blood pressure, pulse, and oxygen                         saturations were monitored continuously. The Endoscope                          was introduced through the mouth, and advanced to the                         third part of duodenum. The upper GI endoscopy was                         accomplished with ease. The patient tolerated the                         procedure well. Findings:      White nummular lesions were noted in the entire esophagus. Brushings for       KOH prep were obtained.      Mucosal changes including feline appearance were found in the mid       esophagus. Biopsies were obtained from the proximal and distal esophagus       with cold forceps for histology of suspected eosinophilic esophagitis.      There is no endoscopic evidence of stenosis or stricture in the entire       esophagus.      The exam of the esophagus was otherwise normal.      Patchy mildly erythematous mucosa without bleeding was found in the       gastric antrum. Biopsies were taken with a cold forceps for histology.       Biopsies were obtained in the gastric body, at the incisura and in the       gastric antrum with cold forceps for histology.      The exam of the stomach was otherwise normal.      The duodenal bulb, second portion of the duodenum and examined duodenum       were normal. Impression:            - White nummular lesions in esophageal mucosa.                         Brushings performed.                        - Esophageal mucosal changes suggestive of                         eosinophilic esophagitis. Biopsied.                        - Erythematous mucosa in the antrum. Biopsied.                        - Normal duodenal bulb, second portion of the duodenum  and examined duodenum.                        - Biopsies were obtained in the gastric body, at the                         incisura and in the gastric antrum. Recommendation:        - Await pathology results.                        - Discharge patient to home (with escort).                        - Advance diet as  tolerated.                        - Continue present medications.                        - Patient has a contact number available for                         emergencies. The signs and symptoms of potential                         delayed complications were discussed with the patient.                         Return to normal activities tomorrow. Written                         discharge instructions were provided to the patient.                        - Discharge patient to home (with escort).                        - The findings and recommendations were discussed with                         the patient.                        - The findings and recommendations were discussed with                         the patient's family. Procedure Code(s):     --- Professional ---                        [phone], Esophagogastroduodenoscopy, flexible,                         transoral; with biopsy, single or multiple Diagnosis Code(s):     --- Professional ---                        K22.8, Other specified diseases of esophagus                        K31.89, Other diseases of stomach and duodenum  R13.10, Dysphagia, unspecified CPT copyright 2019 American Medical Association. All rights reserved. The codes documented in this report are preliminary and upon coder review may  be revised to meet current compliance requirements.  [name], MD Margretta Sidle B. Bonna Gains MD, MD 09/09/2020 10:11:10 AM This report has been signed electronically. Number of Addenda: 0 Note Initiated On: 09/09/2020 9:42 AM Estimated Blood Loss:  Estimated blood loss: none.      [facility]

## 2020-09-09 NOTE — H&P (Signed)
Brittany Antigua, MD [address] [place], [place], [place], [address] [address], [place], [place], [place], [address] Phone: [phone]  Fax: [phone]  Primary Care Physician:  [name], DO   Pre-Procedure History & Physical: HPI:  Brittany Ochoa is a 55 y.o. female is here for an EGD.   Past Medical History:  Diagnosis Date  . Allergy 2015   seasonal  . Ascending aortic aneurysm ([place])    a. 06/2018 CTA Chest: 4.1 x 4.0 cm fusiform Asc Ao aneurysm-unchanged.  Descending thoracic aorta 2.1 cm.  . Asthma    exercised induced  . Dyspnea on exertion    a. 06/2017 echo: EF 60 to 65%.  No regional wall motion abnormalities.  Ascending aortic diameter 3.9 cm.  Mild MR.  . Essential hypertension   . Multiple thyroid nodules   . OCD (obsessive compulsive disorder)   . Reflux esophagitis     Past Surgical History:  Procedure Laterality Date  . BREAST REDUCTION SURGERY  1998  . COSMETIC SURGERY  1997   breast reduction  . ESOPHAGOGASTRODUODENOSCOPY (EGD) WITH PROPOFOL N/A 01/24/2018   Procedure: ESOPHAGOGASTRODUODENOSCOPY (EGD) WITH PROPOFOL;  Surgeon: [name], MD;  Location: [facility] ENDOSCOPY;  Service: Endoscopy;  Laterality: N/A;  . lipoma removal  2007   back  . REDUCTION MAMMAPLASTY Bilateral 12/01/1996    Prior to Admission medications   Medication Sig Start Date End Date Taking? Authorizing Provider  losartan (COZAAR) 50 MG tablet TAKE 1 TABLET BY MOUTH DAILY. 07/05/20 07/05/21 Yes Wellington Hampshire, MD  metFORMIN (GLUCOPHAGE-XR) 500 MG 24 hr tablet TAKE 1 TABLET BY MOUTH TWICE DAILY 01/22/20 01/21/21 Yes Balan, Bindubal, MD  bisoprolol (ZEBETA) 5 MG tablet TAKE 1/2 TABLET (2.5MG ) BY MOUTH ONCE DAILY Patient not taking: Reported on 08/25/2020 01/23/20 01/22/21  [name], MD  fluticasone [facility]) 50 MCG/ACT nasal spray Place into both nostrils. Patient not taking: Reported on 08/25/2020 04/28/20   [provider]  LORazepam (ATIVAN)  0.5 MG tablet Take 0.5-1 tablets (0.25-0.5 mg total) by mouth daily as needed for anxiety. Patient not taking: Reported on 08/25/2020 10/29/19   Park Liter P, DO  meloxicam (MOBIC) 15 MG tablet Take 1 tablet (15 mg total) by mouth daily. Patient not taking: Reported on 08/25/2020 02/13/20   Park Liter P, DO  metoprolol succinate (TOPROL-XL) 25 MG 24 hr tablet Take 25 mg by mouth daily as needed.     [provider]  metoprolol tartrate (LOPRESSOR) 25 MG tablet Take 1 tablet (25 mg total) by mouth 2 (two) times daily. 12/04/19 02/13/20  [name], MD    Allergies as of 08/26/2020 - Review Complete 08/25/2020  Allergen Reaction Noted  . Codeine  08/18/2015  . Medical provider ez flu shot [influenza vac split quad] Other (See Comments) 05/27/2019  . Other Other (See Comments) 01/15/2017  . Quinolones  10/30/2019    Family History  Problem Relation Age of Onset  . Diabetes Mother   . Arthritis Mother   . Obesity Mother   . Diabetes Father   . Heart disease Father   . Heart attack Father   . Cancer Father        bone  . Heart disease Paternal Grandmother   . Cancer Paternal Grandmother        bone  . Breast cancer Paternal Grandmother   . Cancer Paternal Grandfather        pancreatic?  Marland Kitchen Heart disease Paternal Grandfather   .  Osteoporosis Maternal Grandmother   . Arthritis Maternal Grandmother   . Varicose Veins Maternal Grandmother   . Alcohol abuse Maternal Uncle   . Asthma Brother   . Anxiety disorder Brother   . Cancer Paternal Aunt   . Diabetes Paternal Aunt   . Cancer Paternal Uncle     Social History   Socioeconomic History  . Marital status: Single    Spouse name: Not on file  . Number of children: Not on file  . Years of education: Not on file  . Highest education level: Not on file  Occupational History  . Not on file  Tobacco Use  . Smoking status: Former Smoker    Packs/day: 2.00    Years: 25.00    Pack years: 50.00    Types:  Cigarettes    Quit date: 04/17/2004    Years since quitting: 16.4  . Smokeless tobacco: Never Used  Vaping Use  . Vaping Use: Never used  Substance and Sexual Activity  . Alcohol use: Yes    Alcohol/week: 0.0 standard drinks    Comment: socially. none last 24hrs  . Drug use: No    Comment: past only  . Sexual activity: Not Currently  Other Topics Concern  . Not on file  Social History Narrative  . Not on file   Social Determinants of Health   Financial Resource Strain: Not on file  Food Insecurity: Not on file  Transportation Needs: Not on file  Physical Activity: Not on file  Stress: Not on file  Social Connections: Not on file  Intimate Partner Violence: Not on file    Review of Systems: See HPI, otherwise negative ROS  Physical Exam: BP (!) 151/84   Pulse 78   Temp (!) 96.5 F (35.8 C) (Temporal)   Resp 18   Ht 5\' 3"  (1.6 m)   Wt 86.6 kg   SpO2 98%   BMI 33.83 kg/m  General:   Alert,  pleasant and cooperative in NAD Head:  Normocephalic and atraumatic. Neck:  Supple; no masses or thyromegaly. Lungs:  Clear throughout to auscultation, normal respiratory effort.    Heart:  +S1, +S2, Regular rate and rhythm, No edema. Abdomen:  Soft, nontender and nondistended. Normal bowel sounds, without guarding, and without rebound.   Neurologic:  Alert and  oriented x4;  grossly normal neurologically.  Impression/Plan: Brittany Ochoa is here for an EGD for dysphagia  Risks, benefits, limitations, and alternatives regarding the procedure have been reviewed with the patient.  Questions have been answered.  All parties agreeable.   [name], MD  09/09/2020, 9:47 AM

## 2020-09-09 NOTE — Anesthesia Preprocedure Evaluation (Signed)
Anesthesia Evaluation  Patient identified by MRN, date of birth, ID band Patient awake    Reviewed: Allergy & Precautions, NPO status , Patient's Chart, lab work & pertinent test results  History of Anesthesia Complications Negative for: history of anesthetic complications  Airway Mallampati: III  TM Distance: <3 FB Neck ROM: full    Dental  (+) Chipped   Pulmonary asthma , former smoker,    Pulmonary exam normal        Cardiovascular Exercise Tolerance: Good hypertension, (-) angina(-) Past MI and (-) DOE Normal cardiovascular exam     Neuro/Psych PSYCHIATRIC DISORDERS negative neurological ROS     GI/Hepatic negative GI ROS, Neg liver ROS, neg GERD  ,  Endo/Other  negative endocrine ROS  Renal/GU negative Renal ROS  negative genitourinary   Musculoskeletal   Abdominal   Peds  Hematology negative hematology ROS (+)   Anesthesia Other Findings Past Medical History: 2015: Allergy     Comment:  seasonal No date: Ascending aortic aneurysm (HCC)     Comment:  a. 06/2018 CTA Chest: 4.1 x 4.0 cm fusiform Asc Ao               aneurysm-unchanged.  Descending thoracic aorta 2.1 cm. No date: Asthma     Comment:  exercised induced No date: Dyspnea on exertion     Comment:  a. 06/2017 echo: EF 60 to 65%.  No regional wall motion               abnormalities.  Ascending aortic diameter 3.9 cm.  Mild               MR. No date: Essential hypertension No date: Multiple thyroid nodules No date: OCD (obsessive compulsive disorder) No date: Reflux esophagitis  Past Surgical History: 1998: BREAST REDUCTION SURGERY 1997: COSMETIC SURGERY     Comment:  breast reduction 01/24/2018: ESOPHAGOGASTRODUODENOSCOPY (EGD) WITH PROPOFOL; N/A     Comment:  Procedure: ESOPHAGOGASTRODUODENOSCOPY (EGD) WITH               PROPOFOL;  Surgeon: [name], MD;  Location:               [facility] ENDOSCOPY;  Service: Endoscopy;  Laterality:  N/A; 2007: lipoma removal     Comment:  back 12/01/1996: REDUCTION MAMMAPLASTY; Bilateral     Reproductive/Obstetrics negative OB ROS                             Anesthesia Physical Anesthesia Plan  ASA: III  Anesthesia Plan: General   Post-op Pain Management:    Induction: Intravenous  PONV Risk Score and Plan: Propofol infusion and TIVA  Airway Management Planned: Natural Airway and Nasal Cannula  Additional Equipment:   Intra-op Plan:   Post-operative Plan:   Informed Consent: I have reviewed the patients History and Physical, chart, labs and discussed the procedure including the risks, benefits and alternatives for the proposed anesthesia with the patient or authorized representative who has indicated his/her understanding and acceptance.     Dental Advisory Given  Plan Discussed with: Anesthesiologist, CRNA and Surgeon  Anesthesia Plan Comments: (Patient consented for risks of anesthesia including but not limited to:  - adverse reactions to medications - risk of airway placement if required - damage to eyes, teeth, lips or other oral mucosa - nerve damage due to positioning  - sore throat or hoarseness - Damage to heart, brain, nerves, lungs, other parts  of body or loss of life  Patient voiced understanding.)        Anesthesia Quick Evaluation

## 2020-09-10 ENCOUNTER — Encounter: Payer: Self-pay | Admitting: Gastroenterology

## 2020-09-14 ENCOUNTER — Other Ambulatory Visit: Payer: Self-pay

## 2020-09-14 LAB — SURGICAL PATHOLOGY

## 2020-09-14 MED FILL — Metformin HCl Tab ER 24HR 500 MG: ORAL | 30 days supply | Qty: 60 | Fill #0 | Status: AC

## 2020-09-15 ENCOUNTER — Encounter: Payer: Self-pay | Admitting: Gastroenterology

## 2020-09-15 ENCOUNTER — Other Ambulatory Visit: Payer: Self-pay | Admitting: Gastroenterology

## 2020-09-15 ENCOUNTER — Other Ambulatory Visit: Payer: Self-pay

## 2020-09-15 MED ORDER — OMEPRAZOLE 40 MG PO CPDR
40.0000 mg | DELAYED_RELEASE_CAPSULE | Freq: Every day | ORAL | 0 refills | Status: DC
  Filled 2020-09-15: qty 30, 30d supply, fill #0

## 2020-09-17 ENCOUNTER — Other Ambulatory Visit
Admission: RE | Admit: 2020-09-17 | Discharge: 2020-09-17 | Disposition: A | Discharge status: Home / Self Care | Payer: 59 | Attending: Endocrinology | Admitting: Endocrinology

## 2020-09-17 DIAGNOSIS — E049 Nontoxic goiter, unspecified: Secondary | ICD-10-CM | POA: Diagnosis not present

## 2020-09-17 DIAGNOSIS — R7301 Impaired fasting glucose: Secondary | ICD-10-CM | POA: Insufficient documentation

## 2020-09-17 LAB — LIPID PANEL
Cholesterol: 197 mg/dL (ref 0–200)
HDL: 44 mg/dL (ref >40)
LDL Cholesterol: 108 mg/dL — ABNORMAL HIGH (ref 0–99)
Total CHOL/HDL Ratio: 4.5 RATIO
Triglycerides: 226 mg/dL — ABNORMAL HIGH (ref <150)
VLDL: 45 mg/dL — ABNORMAL HIGH (ref 0–40)

## 2020-09-17 LAB — BASIC METABOLIC PANEL
Anion gap: 10 (ref 5–15)
BUN: 13 mg/dL (ref 6–20)
CO2: 28 mmol/L (ref 22–32)
Calcium: 9.5 mg/dL (ref 8.9–10.3)
Chloride: 101 mmol/L (ref 98–111)
Creatinine, Ser: 0.64 mg/dL (ref 0.44–1.00)
GFR, Estimated: >60 mL/min (ref >60)
Glucose, Bld: 94 mg/dL (ref 70–99)
Potassium: 4.3 mmol/L (ref 3.5–5.1)
Sodium: 139 mmol/L (ref 135–145)

## 2020-09-17 LAB — TSH: TSH: 0.74 u[IU]/mL (ref 0.350–4.500)

## 2020-09-17 LAB — HEMOGLOBIN A1C
Hgb A1c MFr Bld: 5.9 % — ABNORMAL HIGH (ref 4.8–5.6)
Mean Plasma Glucose: 123 mg/dL

## 2020-09-21 ENCOUNTER — Telehealth: Payer: Self-pay

## 2020-09-21 DIAGNOSIS — E049 Nontoxic goiter, unspecified: Secondary | ICD-10-CM | POA: Diagnosis not present

## 2020-09-21 DIAGNOSIS — R000 Tachycardia, unspecified: Secondary | ICD-10-CM | POA: Diagnosis not present

## 2020-09-21 DIAGNOSIS — I10 Essential (primary) hypertension: Secondary | ICD-10-CM | POA: Diagnosis not present

## 2020-09-21 DIAGNOSIS — Z923 Personal history of irradiation: Secondary | ICD-10-CM | POA: Diagnosis not present

## 2020-09-21 DIAGNOSIS — E785 Hyperlipidemia, unspecified: Secondary | ICD-10-CM | POA: Diagnosis not present

## 2020-09-21 DIAGNOSIS — R7301 Impaired fasting glucose: Secondary | ICD-10-CM | POA: Diagnosis not present

## 2020-09-21 NOTE — Telephone Encounter (Signed)
Called pt to let her know that Dr Wynetta Emery wants her to go to the ER or er cardiologist instead of waiting for 6/10 appt no answer left vm

## 2020-09-21 NOTE — Telephone Encounter (Signed)
FYI

## 2020-09-21 NOTE — Telephone Encounter (Signed)
Patient is calling back- she states she is not going to ED today- she wants to keep appointment 6/10. Patient states she does not think she is having cardiac event( she works in the [employer], medical) and would go if she did. Patient states her symptoms are the same as at her last visit with PCP - but worse. Patient states she will keep the appointment unless PCP wants her to come after the MRI of heart scheduled on 6/22.

## 2020-09-24 ENCOUNTER — Ambulatory Visit: Payer: 59 | Admitting: Family Medicine

## 2020-10-01 ENCOUNTER — Encounter: Payer: 59 | Admitting: Family Medicine

## 2020-10-06 ENCOUNTER — Ambulatory Visit (INDEPENDENT_AMBULATORY_CARE_PROVIDER_SITE_OTHER): Payer: 59 | Admitting: Surgery

## 2020-10-06 ENCOUNTER — Encounter: Payer: Self-pay | Admitting: Surgery

## 2020-10-06 ENCOUNTER — Other Ambulatory Visit: Payer: Self-pay

## 2020-10-06 ENCOUNTER — Ambulatory Visit
Admission: RE | Admit: 2020-10-06 | Discharge: 2020-10-06 | Disposition: A | Discharge status: Home / Self Care | Payer: 59 | Source: Ambulatory Visit | Attending: Surgery | Admitting: Surgery

## 2020-10-06 VITALS — BP 132/67 | HR 86 | Resp 20 | Ht 63.0 in | Wt 186.0 lb

## 2020-10-06 DIAGNOSIS — I712 Thoracic aortic aneurysm, without rupture, unspecified: Secondary | ICD-10-CM

## 2020-10-06 DIAGNOSIS — R911 Solitary pulmonary nodule: Secondary | ICD-10-CM | POA: Diagnosis not present

## 2020-10-06 MED ORDER — IOPAMIDOL (ISOVUE-370) INJECTION 76%
75.0000 mL | Freq: Once | INTRAVENOUS | Status: AC | PRN
  Administered 2020-10-06: 75 mL via INTRAVENOUS

## 2020-10-06 NOTE — Progress Notes (Signed)
HPI:  The patient is a 55 year old woman who returns for follow-up of a 4.0 cm fusiform ascending aortic aneurysm.  She has a history of atypical chest pain and had a gated cardiac CTA last year showing normal coronary arteries with a calcium score of 0.  She is followed by Dr. Fletcher Anon.  She is also followed by Dr. Caryl Comes for palpitations and autonomic dysfunction.  Current Outpatient Medications  Medication Sig Dispense Refill   fluticasone (FLONASE) 50 MCG/ACT nasal spray Place into both nostrils.     LORazepam (ATIVAN) 0.5 MG tablet Take 0.5-1 tablets (0.25-0.5 mg total) by mouth daily as needed for anxiety. 20 tablet 0   losartan (COZAAR) 50 MG tablet TAKE 1 TABLET BY MOUTH DAILY. 30 tablet 3   meloxicam (MOBIC) 15 MG tablet Take 1 tablet (15 mg total) by mouth daily. 30 tablet 3   metFORMIN (GLUCOPHAGE-XR) 500 MG 24 hr tablet TAKE 1 TABLET BY MOUTH TWICE DAILY 60 tablet 6   metoprolol succinate (TOPROL-XL) 25 MG 24 hr tablet Take 25 mg by mouth daily as needed.      omeprazole (PRILOSEC) 40 MG capsule Take 1 capsule (40 mg total) by mouth daily. 30 capsule 0   No current facility-administered medications for this visit.     Physical Exam: BP 132/67 (BP Location: Right Arm, Patient Position: Sitting)   Pulse 86   Resp 20   Ht 5\' 3"  (1.6 m)   Wt 186 lb (84.4 kg)   SpO2 92% Comment: RA  BMI 32.95 kg/m  She looks well. Cardiac exam shows a regular rate and rhythm with normal heart sounds.  There is no murmur. Lungs are clear.  Diagnostic Tests:  Narrative & Impression  CLINICAL DATA:  55 year old female with ascending thoracic aortic aneurysm. Follow-up study.   EXAM: CT ANGIOGRAPHY CHEST WITH CONTRAST   TECHNIQUE: Multidetector CT imaging of the chest was performed using the standard protocol during bolus administration of intravenous contrast. Multiplanar CT image reconstructions and MIPs were obtained to evaluate the vascular anatomy.   CONTRAST:  Seventy-five mL  Isovue 370, intravenous   COMPARISON:  09/29/2019 04/30/2017   FINDINGS: Cardiovascular: Preferential opacification of the thoracic aorta. Normal heart size. No pericardial effusion.   Sinues of Valsalva: 23 mm 27 x 25 mm ,unchanged   Sinotubular Junction: 27 mm ,unchanged   Ascending Aorta: 40 mm ,unchanged   Aortic Arch: 28 mm ,unchanged   Descending aorta: 21 mm at the level of the carina ,unchanged   Branch vessels: Common origin of the brachiocephalic and left common carotid arteries. Widely patent. No significant atherosclerotic changes.   Coronary arteries: Normal origins and courses. No significant atherosclerotic calcifications.   Main pulmonary artery: 26 mm ,unchanged. No evidence of central pulmonary embolism.   Pulmonary veins: No anomalous pulmonary venous return. No evidence of left atrial appendage thrombus.   Upper abdominal vasculature: Within normal limits.   Mediastinum/Nodes: No enlarged mediastinal, hilar, or axillary lymph nodes. Scattered bilateral thyroid nodules measuring up to 1.4 cm in the right lobe. The trachea and esophagus demonstrate no significant findings.   Lungs/Pleura: No focal consolidations. No suspicious pulmonary nodules. No pleural effusion or pneumothorax.   Upper Abdomen: Marked decreased attenuation of the hepatic parenchyma. Unchanged scattered hyperenhancing foci in the spleen since 2019, indeterminate could represent hemangioma or other benign vascular etiology. The remaining visualized upper abdomen is within normal limits.   Musculoskeletal: No chest wall abnormality. No acute or significant osseous findings.   Review  of the MIP images confirms the above findings.   IMPRESSION: Vascular:   Unchanged fusiform aneurysmal changes of the ascending aorta measuring up to 4.0 cm. Recommend annual imaging followup by CTA or MRA. This recommendation follows 2010 ACCF/AHA/AATS/ACR/ASA/SCA/SCAI/SIR/STS/SVM Guidelines  for the Diagnosis and Management of Patients with Thoracic Aortic Disease. Circulation. 2010; 121: H702-O378. Aortic aneurysm NOS (ICD10-I71.9)   Non-Vascular:   1. Severe hepatic steatosis. 2. Bilateral thyroid nodules, the largest in the right lobe measuring up to 1.4 cm. Dedicated thyroid ultrasound could be considered. (Ref: J Am Coll Radiol. 2015 Feb;12(2): 143-50). (Ref: J Am Coll Radiol. 2015 Feb;12(2): 143-50).   Ruthann Cancer, MD   [facility]   [facility]     Electronically Signed   By: Ruthann Cancer MD   On: 10/06/2020 13:39      Impression:  She has a stable 4.0 cm fusiform ascending aortic aneurysm.  This is small and well below the surgical threshold of 5.5 cm.  I reviewed the CTA images with her and answered her questions.  I stressed the importance of continued good blood pressure control in preventing further enlargement and acute aortic dissection.  I advised her against doing any heavy lifting of more than 35 pounds that may require a Valsalva maneuver.  I also cautioned her against taking quinolone antibiotics that have been associated with enlargement of aortic aneurysms.  She also has bilateral thyroid nodules noted on her CT scan which have been noticed previously and worked up with ultrasound and needle biopsy.  Her aneurysm is small and stable and I think it is reasonable to wait 2 years to repeat her CT scan.  Plan:  I will plan to see her back in 2 years with a CTA of the chest.  I spent 20 minutes performing this established patient evaluation and > 50% of this time was spent face to face counseling and coordinating the care of this patient's aortic aneurysm.    Gaye Pollack, MD [facility] [phone]

## 2020-10-08 ENCOUNTER — Other Ambulatory Visit: Payer: Self-pay

## 2020-10-08 ENCOUNTER — Ambulatory Visit (INDEPENDENT_AMBULATORY_CARE_PROVIDER_SITE_OTHER): Payer: 59 | Admitting: Family Medicine

## 2020-10-08 ENCOUNTER — Encounter: Payer: Self-pay | Admitting: Family Medicine

## 2020-10-08 VITALS — BP 110/70 | HR 70 | Temp 98.2°F | Ht 63.0 in | Wt 189.0 lb

## 2020-10-08 DIAGNOSIS — Z0000 Encounter for general adult medical examination without abnormal findings: Secondary | ICD-10-CM | POA: Diagnosis not present

## 2020-10-08 DIAGNOSIS — Z6833 Body mass index (BMI) 33.0-33.9, adult: Secondary | ICD-10-CM | POA: Insufficient documentation

## 2020-10-08 DIAGNOSIS — Z1231 Encounter for screening mammogram for malignant neoplasm of breast: Secondary | ICD-10-CM | POA: Diagnosis not present

## 2020-10-08 DIAGNOSIS — K760 Fatty (change of) liver, not elsewhere classified: Secondary | ICD-10-CM

## 2020-10-08 DIAGNOSIS — G901 Familial dysautonomia [Riley-Day]: Secondary | ICD-10-CM | POA: Diagnosis not present

## 2020-10-08 DIAGNOSIS — M359 Systemic involvement of connective tissue, unspecified: Secondary | ICD-10-CM | POA: Diagnosis not present

## 2020-10-08 LAB — URINALYSIS, ROUTINE W REFLEX MICROSCOPIC
Bilirubin, UA: NEGATIVE
Glucose, UA: NEGATIVE
Ketones, UA: NEGATIVE
Leukocytes,UA: NEGATIVE
Nitrite, UA: NEGATIVE
RBC, UA: NEGATIVE
Specific Gravity, UA: >1.03 — ABNORMAL HIGH (ref 1.005–1.030)
Urobilinogen, Ur: 0.2 mg/dL (ref 0.2–1.0)
pH, UA: 5.5 (ref 5.0–7.5)

## 2020-10-08 LAB — MICROALBUMIN, URINE WAIVED
Creatinine, Urine Waived: 300 mg/dL (ref 10–300)
Microalb, Ur Waived: 80 mg/L — ABNORMAL HIGH (ref 0–19)

## 2020-10-08 MED ORDER — LORAZEPAM 0.5 MG PO TABS
0.2500 mg | ORAL_TABLET | Freq: Every day | ORAL | 0 refills | Status: DC | PRN
  Filled 2020-10-08: qty 20, 20d supply, fill #0

## 2020-10-08 NOTE — Assessment & Plan Note (Signed)
Continue to follow with dysautonomia clinic. Call with any concerns.

## 2020-10-08 NOTE — Assessment & Plan Note (Signed)
Encouraged diet and exercise with goal of losing 1-2lbs per week.  

## 2020-10-08 NOTE — Assessment & Plan Note (Signed)
Found incidentally on CT. Labs normal. Follow up with GI as needed.

## 2020-10-08 NOTE — Progress Notes (Signed)
BP 110/70   Pulse 70   Temp 98.2 F (36.8 C)   Ht 5\' 3"  (1.6 m)   Wt 189 lb (85.7 kg)   SpO2 95%   BMI 33.48 kg/m    Subjective:    Patient ID: Brittany Ochoa, female    DOB: May 06, 1965, 55 y.o.   MRN: [id number]  HPI: Brittany Ochoa is a 55 y.o. female presenting on 10/08/2020 for comprehensive medical examination. 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 Following with GI.  Saw the dysautonomia doctor- wants to wait to the do the tilt-table test until she has worse symptoms.    Saw the cardiovascular surgeon for the thoracic aortic aneurysm. No need for follow up for 2 years.  She currently lives with: alone Menopausal Symptoms: yes  Depression Screen done today and results listed below:  Depression screen [facility] 2/9 10/08/2020 02/13/2020 01/14/2019 10/04/2018 10/04/2018  Decreased Interest 0 3 3 1 1   Down, Depressed, Hopeless 0 3 3 1 1   PHQ - 2 Score 0 6 6 2 2   Altered sleeping - 3 0 2 2  Tired, decreased energy - 3 3 2 2   Change in appetite - 0 3 3 3   Feeling bad or failure about yourself  - 0 0 3 3  Trouble concentrating - 0 0 1 1  Moving slowly or fidgety/restless - 1 0 0 0  Suicidal thoughts - 0 1 (No Data) 0  PHQ-9 Score - 13 13 13 13   Difficult doing work/chores - Not difficult at all Not difficult at all - Very difficult    Past Medical History:  Past Medical History:  Diagnosis Date   Allergy 2015   seasonal   Ascending aortic aneurysm ([place])    a. 06/2018 CTA Chest: 4.1 x 4.0 cm fusiform Asc Ao aneurysm-unchanged.  Descending thoracic aorta 2.1 cm.   Asthma    exercised induced   Dyspnea on exertion    a. 06/2017 echo: EF 60 to 65%.  No regional wall motion abnormalities.  Ascending aortic diameter 3.9 cm.  Mild MR.   Essential hypertension    Multiple thyroid nodules    OCD (obsessive compulsive disorder)    Reflux esophagitis     Surgical History:  Past Surgical History:  Procedure Laterality Date   [place]   breast reduction   ESOPHAGOGASTRODUODENOSCOPY (EGD) WITH PROPOFOL N/A 01/24/2018   Procedure: ESOPHAGOGASTRODUODENOSCOPY (EGD) WITH PROPOFOL;  Surgeon: [name], MD;  Location: [facility] ENDOSCOPY;  Service: Endoscopy;  Laterality: N/A;   ESOPHAGOGASTRODUODENOSCOPY (EGD) WITH PROPOFOL N/A 09/09/2020   Procedure: ESOPHAGOGASTRODUODENOSCOPY (EGD) WITH PROPOFOL;  Surgeon: [name], MD;  Location: [facility] ENDOSCOPY;  Service: Endoscopy;  Laterality: N/A;   lipoma removal  2007   back   REDUCTION MAMMAPLASTY Bilateral 12/01/1996    Medications:  Current Outpatient Medications on File Prior to Visit  Medication Sig   losartan (COZAAR) 50 MG tablet TAKE 1 TABLET BY MOUTH DAILY.   metFORMIN (GLUCOPHAGE-XR) 500 MG 24 hr tablet TAKE 1 TABLET BY MOUTH TWICE DAILY   metoprolol succinate (TOPROL-XL) 25 MG 24 hr tablet Take 25 mg by mouth daily as needed.    omeprazole (PRILOSEC) 40 MG capsule Take 1 capsule (40 mg total) by mouth daily.   fluticasone (FLONASE) 50 MCG/ACT nasal spray Place into both nostrils. (Patient not taking: Reported on 10/08/2020)   meloxicam (MOBIC) 15 MG tablet Take 1 tablet (15  mg total) by mouth daily. (Patient not taking: Reported on 10/08/2020)   [DISCONTINUED] metoprolol tartrate (LOPRESSOR) 25 MG tablet Take 1 tablet (25 mg total) by mouth 2 (two) times daily.   No current facility-administered medications on file prior to visit.    Allergies:  Allergies  Allergen Reactions   Codeine    Medical Provider Ez Flu Shot [Influenza Vac Split Quad] Other (See Comments)    Severe myalgias and bone pain   Other Other (See Comments)    Other reaction(s): Muscle Pain   Quinolones     Aortic enlargement     Social History:  Social History   Socioeconomic History   Marital status: Single    Spouse name: Not on file   Number of children: Not on file   Years of education: Not on file   Highest education level: Not  on file  Occupational History   Not on file  Tobacco Use   Smoking status: Former    Packs/day: 2.00    Years: 25.00    Pack years: 50.00    Types: Cigarettes    Quit date: 04/17/2004    Years since quitting: 16.4   Smokeless tobacco: Never  Vaping Use   Vaping Use: Never used  Substance and Sexual Activity   Alcohol use: Yes    Alcohol/week: 0.0 standard drinks    Comment: socially. none last 24hrs   Drug use: No    Comment: past only   Sexual activity: Not Currently  Other Topics Concern   Not on file  Social History Narrative   Not on file   Social Determinants of Health   Financial Resource Strain: Not on file  Food Insecurity: Not on file  Transportation Needs: Not on file  Physical Activity: Not on file  Stress: Not on file  Social Connections: Not on file  Intimate Partner Violence: Not on file   Social History   Tobacco Use  Smoking Status Former   Packs/day: 2.00   Years: 25.00   Pack years: 50.00   Types: Cigarettes   Quit date: 04/17/2004   Years since quitting: 16.4  Smokeless Tobacco Never   Social History   Substance and Sexual Activity  Alcohol Use Yes   Alcohol/week: 0.0 standard drinks   Comment: socially. none last 24hrs    Family History:  Family History  Problem Relation Age of Onset   Diabetes Mother    Arthritis Mother    Obesity Mother    Diabetes Father    Heart disease Father    Heart attack Father    Cancer Father        bone   Heart disease Paternal Grandmother    Cancer Paternal Grandmother        bone   Breast cancer Paternal Grandmother    Cancer Paternal Grandfather        pancreatic?   Heart disease Paternal Grandfather    Osteoporosis Maternal Grandmother    Arthritis Maternal Grandmother    Varicose Veins Maternal Grandmother    Alcohol abuse Maternal Uncle    Asthma Brother    Anxiety disorder Brother    Cancer Paternal Aunt    Diabetes Paternal Aunt    Cancer Paternal Uncle     Past medical history,  surgical history, medications, allergies, family history and social history reviewed with patient today and changes made to appropriate areas of the chart.   Review of Systems  Constitutional:  Positive for diaphoresis and malaise/fatigue. Negative for  chills, fever and weight loss.  HENT: Negative.    Eyes: Negative.   Respiratory: Negative.    Cardiovascular:  Positive for palpitations and orthopnea. Negative for chest pain, claudication, leg swelling and PND.  Gastrointestinal:  Positive for heartburn. Negative for abdominal pain, blood in stool, constipation, diarrhea, melena, nausea and vomiting.  Genitourinary: Negative.   Musculoskeletal:  Positive for joint pain and myalgias. Negative for back pain, falls and neck pain.  Skin: Negative.   Neurological:  Positive for dizziness. Negative for tingling, tremors, sensory change, speech change, focal weakness, seizures, loss of consciousness, weakness and headaches.  Psychiatric/Behavioral:  Positive for depression. Negative for hallucinations, memory loss, substance abuse and suicidal ideas. The patient is nervous/anxious. The patient does not have insomnia.   All other ROS negative except what is listed above and in the HPI.      Objective:    BP 110/70   Pulse 70   Temp 98.2 F (36.8 C)   Ht 5\' 3"  (1.6 m)   Wt 189 lb (85.7 kg)   SpO2 95%   BMI 33.48 kg/m   Wt Readings from Last 3 Encounters:  10/08/20 189 lb (85.7 kg)  10/06/20 186 lb (84.4 kg)  09/09/20 191 lb (86.6 kg)    Physical Exam Vitals and nursing note reviewed.  Constitutional:      General: She is not in acute distress.    Appearance: Normal appearance. She is not ill-appearing, toxic-appearing or diaphoretic.  HENT:     Head: Normocephalic and atraumatic.     Right Ear: Tympanic membrane, ear canal and external ear normal. There is no impacted cerumen.     Left Ear: Tympanic membrane, ear canal and external ear normal. There is no impacted cerumen.     Nose:  Nose normal. No congestion or rhinorrhea.     Mouth/Throat:     Mouth: Mucous membranes are moist.     Pharynx: Oropharynx is clear. No oropharyngeal exudate or posterior oropharyngeal erythema.  Eyes:     General: No scleral icterus.       Right eye: No discharge.        Left eye: No discharge.     Extraocular Movements: Extraocular movements intact.     Conjunctiva/sclera: Conjunctivae normal.     Pupils: Pupils are equal, round, and reactive to light.  Neck:     Vascular: No carotid bruit.  Cardiovascular:     Rate and Rhythm: Normal rate and regular rhythm.     Pulses: Normal pulses.     Heart sounds: No murmur heard.   No friction rub. No gallop.  Pulmonary:     Effort: Pulmonary effort is normal. No respiratory distress.     Breath sounds: Normal breath sounds. No stridor. No wheezing, rhonchi or rales.  Chest:     Chest wall: No tenderness.  Abdominal:     General: Abdomen is flat. Bowel sounds are normal. There is no distension.     Palpations: Abdomen is soft. There is no mass.     Tenderness: There is no abdominal tenderness. There is no right CVA tenderness, left CVA tenderness, guarding or rebound.     Hernia: No hernia is present.  Genitourinary:    Comments: Breast and pelvic exams deferred with shared decision making Musculoskeletal:        General: No swelling, tenderness, deformity or signs of injury.     Cervical back: Normal range of motion and neck supple. No rigidity. No muscular tenderness.  Right lower leg: No edema.     Left lower leg: No edema.  Lymphadenopathy:     Cervical: No cervical adenopathy.  Skin:    General: Skin is warm and dry.     Capillary Refill: Capillary refill takes less than 2 seconds.     Coloration: Skin is not jaundiced or pale.     Findings: No bruising, erythema, lesion or rash.  Neurological:     General: No focal deficit present.     Mental Status: She is alert and oriented to person, place, and time. Mental status is  at baseline.     Cranial Nerves: No cranial nerve deficit.     Sensory: No sensory deficit.     Motor: No weakness.     Coordination: Coordination normal.     Gait: Gait normal.     Deep Tendon Reflexes: Reflexes normal.  Psychiatric:        Mood and Affect: Mood normal.        Behavior: Behavior normal.        Thought Content: Thought content normal.        Judgment: Judgment normal.    Results for orders placed or performed during the hospital encounter of 09/17/20  Lipid panel  Result Value Ref Range   Cholesterol 197 0 - 200 mg/dL   Triglycerides 226 (H) <150 mg/dL   HDL 44 >40 mg/dL   Total CHOL/HDL Ratio 4.5 RATIO   VLDL 45 (H) 0 - 40 mg/dL   LDL Cholesterol 108 (H) 0 - 99 mg/dL  Hemoglobin A1c  Result Value Ref Range   Hgb A1c MFr Bld 5.9 (H) 4.8 - 5.6 %   Mean Plasma Glucose 123 mg/dL  TSH  Result Value Ref Range   TSH 0.740 0.350 - 4.500 uIU/mL  Basic metabolic panel  Result Value Ref Range   Sodium 139 135 - 145 mmol/L   Potassium 4.3 3.5 - 5.1 mmol/L   Chloride 101 98 - 111 mmol/L   CO2 28 22 - 32 mmol/L   Glucose, Bld 94 70 - 99 mg/dL   BUN 13 6 - 20 mg/dL   Creatinine, Ser 0.64 0.44 - 1.00 mg/dL   Calcium 9.5 8.9 - 10.3 mg/dL   GFR, Estimated >60 >60 mL/min   Anion gap 10 5 - 15      Assessment & Plan:   Problem List Items Addressed This Visit       Digestive   Hepatic steatosis    Found incidentally on CT. Labs normal. Follow up with GI as needed.          Nervous and Auditory   Dysautonomia ([place])    Continue to follow with dysautonomia clinic. Call with any concerns.          Other   Autoimmune disease ([place])    Continue to follow with dysautonomia clinic. Call with any concerns.        BMI 33.0-33.9,adult    Encouraged diet and exercise with goal of losing 1-2lbs per week.       Other Visit Diagnoses     Routine general medical examination at a health care facility    -  Primary   Vaccines up to date. Screening labs checked  today. Pap through GYN. Mammo ordered. Cologuard up to date. Continue diet and exercise. Call with any concerns.    Relevant Orders   CBC with Differential/Platelet   Comprehensive metabolic panel   Lipid Panel w/o Chol/HDL Ratio  Urinalysis, Routine w reflex microscopic   TSH   Microalbumin, Urine Waived   Hepatitis C Antibody   SARS-CoV-2 Semi-Quantitative Total Antibody, Spike   Encounter for screening mammogram for malignant neoplasm of breast       Mammogram ordered today.   Relevant Orders   MM DIAG BREAST TOMO BILATERAL        Follow up plan: Return in about 6 months (around 04/09/2021).   LABORATORY TESTING:  - Pap smear: done elsewhere  IMMUNIZATIONS:   - Tdap: Tetanus vaccination status reviewed: last tetanus booster within 10 years. - Influenza: Allergic - Pneumovax: Not applicable - Prevnar: Not applicable - COVID: Up to date  SCREENING: -Mammogram: Up to date  - Cologuard: Up to date   PATIENT COUNSELING:   Advised to take 1 mg of folate supplement per day if capable of pregnancy.   Sexuality: Discussed sexually transmitted diseases, partner selection, use of condoms, avoidance of unintended pregnancy  and contraceptive alternatives.   Advised to avoid cigarette smoking.  I discussed with the patient that most people either abstain from alcohol or drink within safe limits (<=14/week and <=4 drinks/occasion for males, <=7/weeks and <= 3 drinks/occasion for females) and that the risk for alcohol disorders and other health effects rises proportionally with the number of drinks per week and how often a drinker exceeds daily limits.  Discussed cessation/primary prevention of drug use and availability of treatment for abuse.   Diet: Encouraged to adjust caloric intake to maintain  or achieve ideal body weight, to reduce intake of dietary saturated fat and total fat, to limit sodium intake by avoiding high sodium foods and not adding table salt, and to maintain  adequate dietary potassium and calcium preferably from fresh fruits, vegetables, and low-fat dairy products.    stressed the importance of regular exercise  Injury prevention: Discussed safety belts, safety helmets, smoke detector, smoking near bedding or upholstery.   Dental health: Discussed importance of regular tooth brushing, flossing, and dental visits.    NEXT PREVENTATIVE PHYSICAL DUE IN 1 YEAR. Return in about 6 months (around 04/09/2021).

## 2020-10-09 LAB — COMPREHENSIVE METABOLIC PANEL
ALT: 35 IU/L — ABNORMAL HIGH (ref 0–32)
AST: 30 IU/L (ref 0–40)
Albumin/Globulin Ratio: 2.2 (ref 1.2–2.2)
Albumin: 4.7 g/dL (ref 3.8–4.9)
Alkaline Phosphatase: 84 IU/L (ref 44–121)
BUN/Creatinine Ratio: 14 (ref 9–23)
BUN: 10 mg/dL (ref 6–24)
Bilirubin Total: 0.4 mg/dL (ref 0.0–1.2)
CO2: 24 mmol/L (ref 20–29)
Calcium: 9 mg/dL (ref 8.7–10.2)
Chloride: 104 mmol/L (ref 96–106)
Creatinine, Ser: 0.69 mg/dL (ref 0.57–1.00)
Globulin, Total: 2.1 g/dL (ref 1.5–4.5)
Glucose: 93 mg/dL (ref 65–99)
Potassium: 4.1 mmol/L (ref 3.5–5.2)
Sodium: 143 mmol/L (ref 134–144)
Total Protein: 6.8 g/dL (ref 6.0–8.5)
eGFR: 104 mL/min/{1.73_m2} (ref >59)

## 2020-10-09 LAB — CBC WITH DIFFERENTIAL/PLATELET
Basophils Absolute: 0 10*3/uL (ref 0.0–0.2)
Basos: 1 %
EOS (ABSOLUTE): 0.1 10*3/uL (ref 0.0–0.4)
Eos: 2 %
Hematocrit: 38.7 % (ref 34.0–46.6)
Hemoglobin: 13.4 g/dL (ref 11.1–15.9)
Immature Grans (Abs): 0 10*3/uL (ref 0.0–0.1)
Immature Granulocytes: 0 %
Lymphocytes Absolute: 1.6 10*3/uL (ref 0.7–3.1)
Lymphs: 26 %
MCH: 30.9 pg (ref 26.6–33.0)
MCHC: 34.6 g/dL (ref 31.5–35.7)
MCV: 89 fL (ref 79–97)
Monocytes Absolute: 0.4 10*3/uL (ref 0.1–0.9)
Monocytes: 7 %
Neutrophils Absolute: 3.9 10*3/uL (ref 1.4–7.0)
Neutrophils: 64 %
Platelets: 242 10*3/uL (ref 150–450)
RBC: 4.33 x10E6/uL (ref 3.77–5.28)
RDW: 12.8 % (ref 11.7–15.4)
WBC: 6.1 10*3/uL (ref 3.4–10.8)

## 2020-10-09 LAB — LIPID PANEL W/O CHOL/HDL RATIO
Cholesterol, Total: 176 mg/dL (ref 100–199)
HDL: 42 mg/dL (ref >39)
LDL Chol Calc (NIH): 100 mg/dL — ABNORMAL HIGH (ref 0–99)
Triglycerides: 196 mg/dL — ABNORMAL HIGH (ref 0–149)
VLDL Cholesterol Cal: 34 mg/dL (ref 5–40)

## 2020-10-09 LAB — SARS-COV-2 SEMI-QUANTITATIVE TOTAL ANTIBODY, SPIKE: SARS-CoV-2 Spike Ab Interp: POSITIVE

## 2020-10-09 LAB — TSH: TSH: 0.33 u[IU]/mL — ABNORMAL LOW (ref 0.450–4.500)

## 2020-10-09 LAB — HEPATITIS C ANTIBODY: Hep C Virus Ab: <0.1 s/co ratio (ref 0.0–0.9)

## 2020-10-09 LAB — SARS-COV-2 SPIKE AB DILUTION: SARS-CoV-2 Spike Ab Dilution: 8986 U/mL (ref <0.8)

## 2020-10-11 ENCOUNTER — Ambulatory Visit
Admission: RE | Admit: 2020-10-11 | Discharge: 2020-10-11 | Disposition: A | Discharge status: Home / Self Care | Payer: 59 | Source: Ambulatory Visit | Attending: Cardiovascular Disease | Admitting: Cardiovascular Disease

## 2020-10-11 ENCOUNTER — Encounter: Payer: Self-pay | Admitting: Family Medicine

## 2020-10-11 ENCOUNTER — Other Ambulatory Visit: Payer: Self-pay

## 2020-10-11 ENCOUNTER — Other Ambulatory Visit: Payer: Self-pay | Admitting: Family Medicine

## 2020-10-11 ENCOUNTER — Telehealth: Payer: Self-pay | Admitting: Cardiovascular Disease

## 2020-10-11 DIAGNOSIS — R9431 Abnormal electrocardiogram [ECG] [EKG]: Secondary | ICD-10-CM | POA: Insufficient documentation

## 2020-10-11 DIAGNOSIS — R002 Palpitations: Secondary | ICD-10-CM | POA: Insufficient documentation

## 2020-10-11 DIAGNOSIS — R809 Proteinuria, unspecified: Secondary | ICD-10-CM

## 2020-10-11 NOTE — Telephone Encounter (Signed)
Patient walked in to the office.  Patient works at the [employer] Patient complains of skipped beats that feel like it is taking her breath away. She has a hx of palpitations and sts that this feel different.  Adv her that we cannot accommodate walk ins and I need a providers order in order to do an EKG. Adv the patient that I will send a message to Dr. Fletcher Anon to see if an EKG can be ordered to be d[facility].  Verbal ok received from Dr. Fletcher Anon  EKG appt scheduled today at 12:30pm [facility] mall.  Called the patient and made her aware. Adv her to check in at the medical mall at between 12:15-12:30pm. Patient verbalized understanding and voiced appreciation for the assistance.

## 2020-10-11 NOTE — Telephone Encounter (Signed)
Patient calling - working at [employer]  Patient states that her heart is skipping beats, out of breath, headache and just has a funny feeling Her BP is 150/93 but she has been taking her losartan 50 MG Patient wants to come to our office for an EKG - does not want to go to ED Please call to discuss

## 2020-10-12 ENCOUNTER — Other Ambulatory Visit: Payer: Self-pay

## 2020-10-12 MED FILL — Losartan Potassium Tab 50 MG: ORAL | 30 days supply | Qty: 30 | Fill #0 | Status: AC

## 2020-10-13 ENCOUNTER — Other Ambulatory Visit: Payer: Self-pay | Admitting: Endocrinology

## 2020-10-14 ENCOUNTER — Other Ambulatory Visit: Payer: Self-pay | Admitting: Endocrinology

## 2020-10-14 ENCOUNTER — Other Ambulatory Visit (HOSPITAL_COMMUNITY): Payer: Self-pay | Admitting: Endocrinology

## 2020-10-14 DIAGNOSIS — E049 Nontoxic goiter, unspecified: Secondary | ICD-10-CM

## 2020-10-17 ENCOUNTER — Other Ambulatory Visit: Payer: Self-pay | Admitting: Gastroenterology

## 2020-10-19 ENCOUNTER — Other Ambulatory Visit: Payer: Self-pay | Admitting: Gastroenterology

## 2020-10-19 ENCOUNTER — Other Ambulatory Visit: Payer: Self-pay

## 2020-10-21 ENCOUNTER — Other Ambulatory Visit: Payer: Self-pay

## 2020-10-21 ENCOUNTER — Ambulatory Visit (HOSPITAL_COMMUNITY)
Admission: RE | Admit: 2020-10-21 | Discharge: 2020-10-21 | Disposition: A | Discharge status: Home / Self Care | Payer: 59 | Source: Ambulatory Visit | Attending: Endocrinology | Admitting: Endocrinology

## 2020-10-21 DIAGNOSIS — E049 Nontoxic goiter, unspecified: Secondary | ICD-10-CM | POA: Insufficient documentation

## 2020-10-21 DIAGNOSIS — E042 Nontoxic multinodular goiter: Secondary | ICD-10-CM | POA: Diagnosis not present

## 2020-10-22 ENCOUNTER — Other Ambulatory Visit: Payer: Self-pay

## 2020-10-29 ENCOUNTER — Other Ambulatory Visit: Payer: Self-pay

## 2020-11-03 DIAGNOSIS — M9903 Segmental and somatic dysfunction of lumbar region: Secondary | ICD-10-CM | POA: Diagnosis not present

## 2020-11-03 DIAGNOSIS — M5431 Sciatica, right side: Secondary | ICD-10-CM | POA: Diagnosis not present

## 2020-11-03 DIAGNOSIS — M542 Cervicalgia: Secondary | ICD-10-CM | POA: Diagnosis not present

## 2020-11-03 DIAGNOSIS — M9901 Segmental and somatic dysfunction of cervical region: Secondary | ICD-10-CM | POA: Diagnosis not present

## 2020-11-05 DIAGNOSIS — M9901 Segmental and somatic dysfunction of cervical region: Secondary | ICD-10-CM | POA: Diagnosis not present

## 2020-11-05 DIAGNOSIS — M9903 Segmental and somatic dysfunction of lumbar region: Secondary | ICD-10-CM | POA: Diagnosis not present

## 2020-11-05 DIAGNOSIS — M542 Cervicalgia: Secondary | ICD-10-CM | POA: Diagnosis not present

## 2020-11-05 DIAGNOSIS — M5431 Sciatica, right side: Secondary | ICD-10-CM | POA: Diagnosis not present

## 2020-11-09 ENCOUNTER — Other Ambulatory Visit: Payer: Self-pay | Admitting: Endocrinology

## 2020-11-09 DIAGNOSIS — E049 Nontoxic goiter, unspecified: Secondary | ICD-10-CM

## 2020-11-12 DIAGNOSIS — M9901 Segmental and somatic dysfunction of cervical region: Secondary | ICD-10-CM | POA: Diagnosis not present

## 2020-11-12 DIAGNOSIS — M9903 Segmental and somatic dysfunction of lumbar region: Secondary | ICD-10-CM | POA: Diagnosis not present

## 2020-11-12 DIAGNOSIS — M5431 Sciatica, right side: Secondary | ICD-10-CM | POA: Diagnosis not present

## 2020-11-12 DIAGNOSIS — M542 Cervicalgia: Secondary | ICD-10-CM | POA: Diagnosis not present

## 2020-11-15 DIAGNOSIS — M9901 Segmental and somatic dysfunction of cervical region: Secondary | ICD-10-CM | POA: Diagnosis not present

## 2020-11-15 DIAGNOSIS — M9903 Segmental and somatic dysfunction of lumbar region: Secondary | ICD-10-CM | POA: Diagnosis not present

## 2020-11-15 DIAGNOSIS — M5431 Sciatica, right side: Secondary | ICD-10-CM | POA: Diagnosis not present

## 2020-11-15 DIAGNOSIS — M542 Cervicalgia: Secondary | ICD-10-CM | POA: Diagnosis not present

## 2020-11-17 ENCOUNTER — Other Ambulatory Visit: Payer: Self-pay | Admitting: Endocrinology

## 2020-11-17 DIAGNOSIS — M9901 Segmental and somatic dysfunction of cervical region: Secondary | ICD-10-CM | POA: Diagnosis not present

## 2020-11-17 DIAGNOSIS — E049 Nontoxic goiter, unspecified: Secondary | ICD-10-CM

## 2020-11-17 DIAGNOSIS — M542 Cervicalgia: Secondary | ICD-10-CM | POA: Diagnosis not present

## 2020-11-17 DIAGNOSIS — M5431 Sciatica, right side: Secondary | ICD-10-CM | POA: Diagnosis not present

## 2020-11-17 DIAGNOSIS — M9903 Segmental and somatic dysfunction of lumbar region: Secondary | ICD-10-CM | POA: Diagnosis not present

## 2020-11-19 DIAGNOSIS — M542 Cervicalgia: Secondary | ICD-10-CM | POA: Diagnosis not present

## 2020-11-19 DIAGNOSIS — M9903 Segmental and somatic dysfunction of lumbar region: Secondary | ICD-10-CM | POA: Diagnosis not present

## 2020-11-19 DIAGNOSIS — M9901 Segmental and somatic dysfunction of cervical region: Secondary | ICD-10-CM | POA: Diagnosis not present

## 2020-11-19 DIAGNOSIS — M5431 Sciatica, right side: Secondary | ICD-10-CM | POA: Diagnosis not present

## 2020-11-22 DIAGNOSIS — M5431 Sciatica, right side: Secondary | ICD-10-CM | POA: Diagnosis not present

## 2020-11-22 DIAGNOSIS — M9901 Segmental and somatic dysfunction of cervical region: Secondary | ICD-10-CM | POA: Diagnosis not present

## 2020-11-22 DIAGNOSIS — M542 Cervicalgia: Secondary | ICD-10-CM | POA: Diagnosis not present

## 2020-11-22 DIAGNOSIS — M9903 Segmental and somatic dysfunction of lumbar region: Secondary | ICD-10-CM | POA: Diagnosis not present

## 2020-11-24 DIAGNOSIS — M9903 Segmental and somatic dysfunction of lumbar region: Secondary | ICD-10-CM | POA: Diagnosis not present

## 2020-11-24 DIAGNOSIS — M542 Cervicalgia: Secondary | ICD-10-CM | POA: Diagnosis not present

## 2020-11-24 DIAGNOSIS — M5431 Sciatica, right side: Secondary | ICD-10-CM | POA: Diagnosis not present

## 2020-11-24 DIAGNOSIS — M9901 Segmental and somatic dysfunction of cervical region: Secondary | ICD-10-CM | POA: Diagnosis not present

## 2020-11-26 ENCOUNTER — Encounter: Payer: Self-pay | Admitting: Family Medicine

## 2020-11-26 ENCOUNTER — Other Ambulatory Visit: Payer: Self-pay

## 2020-11-26 ENCOUNTER — Ambulatory Visit: Payer: 59 | Admitting: Family Medicine

## 2020-11-26 VITALS — BP 114/69 | HR 83 | Temp 98.2°F | Wt 173.6 lb

## 2020-11-26 DIAGNOSIS — R221 Localized swelling, mass and lump, neck: Secondary | ICD-10-CM | POA: Diagnosis not present

## 2020-11-26 DIAGNOSIS — R634 Abnormal weight loss: Secondary | ICD-10-CM

## 2020-11-26 DIAGNOSIS — M5431 Sciatica, right side: Secondary | ICD-10-CM | POA: Diagnosis not present

## 2020-11-26 DIAGNOSIS — M542 Cervicalgia: Secondary | ICD-10-CM | POA: Diagnosis not present

## 2020-11-26 DIAGNOSIS — M9901 Segmental and somatic dysfunction of cervical region: Secondary | ICD-10-CM | POA: Diagnosis not present

## 2020-11-26 DIAGNOSIS — M9903 Segmental and somatic dysfunction of lumbar region: Secondary | ICD-10-CM | POA: Diagnosis not present

## 2020-11-26 LAB — URINALYSIS, ROUTINE W REFLEX MICROSCOPIC
Bilirubin, UA: NEGATIVE
Glucose, UA: NEGATIVE
Ketones, UA: NEGATIVE
Leukocytes,UA: NEGATIVE
Nitrite, UA: NEGATIVE
Protein,UA: NEGATIVE
RBC, UA: NEGATIVE
Specific Gravity, UA: 1.01 (ref 1.005–1.030)
Urobilinogen, Ur: 0.2 mg/dL (ref 0.2–1.0)
pH, UA: 7 (ref 5.0–7.5)

## 2020-11-26 NOTE — Progress Notes (Signed)
BP 114/69   Pulse 83   Temp 98.2 F (36.8 C) (Oral)   Wt 173 lb 9.6 oz (78.7 kg)   SpO2 98%   BMI 30.75 kg/m    Subjective:    Patient ID: Brittany Ochoa, adult    DOB: 1966/03/25, 55 y.o.   MRN: [id number]  HPI: Brittany Ochoa is a 55 y.o. adult  Chief Complaint  Patient presents with   Mass    Pt states she has a spot on L side of her neck that has been there for years. States it has gotten bigger and she can feel a hard spot in it.    LUMP Duration: chronic- worse in the past week Location: L supraclaviclar Onset: sudden Painful: no Discomfort: no Status:  bigger Trauma: no Redness: no Bruising: no Recent infection: no Swollen lymph nodes: no Requesting removal:  unsure History of cancer: no Family history of cancer: yes History of the same: no  WEIGHT LOSS- has completely changed her diet- has stopped eating any sugar, has been under a lot of stress Duration: about 20 lbs in about 4 months  Fevers: no Decreased appetite: no Night sweats: yes- no changes Dysphagia/odynophagia: yes- something different from last time.  Chest pain: no Shortness of breath: no Cough: no Nausea: no Vomiting: no Abdominal pain: no Blood in stool: no Easy bruising/bleeding: no Jaundice: no Polydipsia/polyuria: no Depression: no Previous colonoscopy: no  Relevant past medical, surgical, family and social history reviewed and updated as indicated. Interim medical history since our last visit reviewed. Allergies and medications reviewed and updated.  Review of Systems  Constitutional:  Positive for unexpected weight change. Negative for activity change, appetite change, chills, diaphoresis, fatigue and fever.  HENT: Negative.    Respiratory: Negative.    Cardiovascular: Negative.   Gastrointestinal: Negative.   Psychiatric/Behavioral:  Negative for agitation, behavioral problems, confusion, decreased concentration, dysphoric mood, hallucinations, self-injury, sleep  disturbance and suicidal ideas. The patient is nervous/anxious. The patient is not hyperactive.    Per HPI unless specifically indicated above     Objective:    BP 114/69   Pulse 83   Temp 98.2 F (36.8 C) (Oral)   Wt 173 lb 9.6 oz (78.7 kg)   SpO2 98%   BMI 30.75 kg/m   Wt Readings from Last 3 Encounters:  11/26/20 173 lb 9.6 oz (78.7 kg)  10/08/20 189 lb (85.7 kg)  10/06/20 186 lb (84.4 kg)    Physical Exam Vitals and nursing note reviewed.  Constitutional:      General: She is not in acute distress.    Appearance: Normal appearance. She is not ill-appearing, toxic-appearing or diaphoretic.  HENT:     Head: Normocephalic and atraumatic.     Right Ear: External ear normal.     Left Ear: External ear normal.     Nose: Nose normal.     Mouth/Throat:     Mouth: Mucous membranes are moist.     Pharynx: Oropharynx is clear.  Eyes:     General: No scleral icterus.       Right eye: No discharge.        Left eye: No discharge.     Extraocular Movements: Extraocular movements intact.     Conjunctiva/sclera: Conjunctivae normal.     Pupils: Pupils are equal, round, and reactive to light.  Neck:   Cardiovascular:     Rate and Rhythm: Normal rate and regular rhythm.     Pulses: Normal pulses.  Heart sounds: Normal heart sounds. No murmur heard.   No friction rub. No gallop.  Pulmonary:     Effort: Pulmonary effort is normal. No respiratory distress.     Breath sounds: Normal breath sounds. No stridor. No wheezing, rhonchi or rales.  Chest:     Chest wall: No tenderness.  Musculoskeletal:        General: Normal range of motion.     Cervical back: Normal range of motion and neck supple.  Lymphadenopathy:     Cervical: No cervical adenopathy.  Skin:    General: Skin is warm and dry.     Capillary Refill: Capillary refill takes less than 2 seconds.     Coloration: Skin is not jaundiced or pale.     Findings: No bruising, erythema, lesion or rash.  Neurological:      General: No focal deficit present.     Mental Status: She is alert and oriented to person, place, and time. Mental status is at baseline.  Psychiatric:        Mood and Affect: Mood normal.        Behavior: Behavior normal.        Thought Content: Thought content normal.        Judgment: Judgment normal.    Results for orders placed or performed in visit on 10/08/20  CBC with Differential/Platelet  Result Value Ref Range   WBC 6.1 3.4 - 10.8 x10E3/uL   RBC 4.33 3.77 - 5.28 x10E6/uL   Hemoglobin 13.4 11.1 - 15.9 g/dL   Hematocrit 38.7 34.0 - 46.6 %   MCV 89 79 - 97 fL   MCH 30.9 26.6 - 33.0 pg   MCHC 34.6 31.5 - 35.7 g/dL   RDW 12.8 11.7 - 15.4 %   Platelets 242 150 - 450 x10E3/uL   Neutrophils 64 Not Estab. %   Lymphs 26 Not Estab. %   Monocytes 7 Not Estab. %   Eos 2 Not Estab. %   Basos 1 Not Estab. %   Neutrophils Absolute 3.9 1.4 - 7.0 x10E3/uL   Lymphocytes Absolute 1.6 0.7 - 3.1 x10E3/uL   Monocytes Absolute 0.4 0.1 - 0.9 x10E3/uL   EOS (ABSOLUTE) 0.1 0.0 - 0.4 x10E3/uL   Basophils Absolute 0.0 0.0 - 0.2 x10E3/uL   Immature Granulocytes 0 Not Estab. %   Immature Grans (Abs) 0.0 0.0 - 0.1 x10E3/uL  Comprehensive metabolic panel  Result Value Ref Range   Glucose 93 65 - 99 mg/dL   BUN 10 6 - 24 mg/dL   Creatinine, Ser 0.69 0.57 - 1.00 mg/dL   eGFR 104 >59 mL/min/1.73   BUN/Creatinine Ratio 14 9 - 23   Sodium 143 134 - 144 mmol/L   Potassium 4.1 3.5 - 5.2 mmol/L   Chloride 104 96 - 106 mmol/L   CO2 24 20 - 29 mmol/L   Calcium 9.0 8.7 - 10.2 mg/dL   Total Protein 6.8 6.0 - 8.5 g/dL   Albumin 4.7 3.8 - 4.9 g/dL   Globulin, Total 2.1 1.5 - 4.5 g/dL   Albumin/Globulin Ratio 2.2 1.2 - 2.2   Bilirubin Total 0.4 0.0 - 1.2 mg/dL   Alkaline Phosphatase 84 44 - 121 IU/L   AST 30 0 - 40 IU/L   ALT 35 (H) 0 - 32 IU/L  Lipid Panel w/o Chol/HDL Ratio  Result Value Ref Range   Cholesterol, Total 176 100 - 199 mg/dL   Triglycerides 196 (H) 0 - 149 mg/dL   HDL 42 >39  mg/dL    VLDL Cholesterol Cal 34 5 - 40 mg/dL   LDL Chol Calc (NIH) 100 (H) 0 - 99 mg/dL  Urinalysis, Routine w reflex microscopic  Result Value Ref Range   Specific Gravity, UA >1.030 (H) 1.005 - 1.030   pH, UA 5.5 5.0 - 7.5   Color, UA Yellow Yellow   Appearance Ur Clear Clear   Leukocytes,UA Negative Negative   Protein,UA Trace (A) Negative/Trace   Glucose, UA Negative Negative   Ketones, UA Negative Negative   RBC, UA Negative Negative   Bilirubin, UA Negative Negative   Urobilinogen, Ur 0.2 0.2 - 1.0 mg/dL   Nitrite, UA Negative Negative  TSH  Result Value Ref Range   TSH 0.330 (L) 0.450 - 4.500 uIU/mL  Microalbumin, Urine Waived  Result Value Ref Range   Microalb, Ur Waived 80 (H) 0 - 19 mg/L   Creatinine, Urine Waived 300 10 - 300 mg/dL   Microalb/Creat Ratio 30-300 (H) <30 mg/g  Hepatitis C Antibody  Result Value Ref Range   Hep C Virus Ab <0.1 0.0 - 0.9 s/co ratio  SARS-CoV-2 Semi-Quantitative Total Antibody, Spike  Result Value Ref Range   SARS-CoV-2 Semi-Quant Total Ab See Dilution Negative<0.8 U/mL   SARS-CoV-2 Spike Ab Interp Positive   SARS-CoV-2 Spike Ab Dilution  Result Value Ref Range   SARS-CoV-2 Spike Ab Dilution 8,986 Negative<0.8 U/mL      Assessment & Plan:   Problem List Items Addressed This Visit   None Visit Diagnoses     Weight loss    -  Primary   Likely due to diet changes and stress. Will check labs. Await results.    Relevant Orders   CBC with Differential/Platelet   Comprehensive metabolic panel   Thyroid Panel With TSH   Urinalysis, Routine w reflex microscopic   Lump in neck       Patient would like to go see general surgery. Referral generated today. Call with any concerns.    Relevant Orders   Ambulatory referral to General Surgery        Follow up plan: No follow-ups on file.

## 2020-11-27 LAB — CBC WITH DIFFERENTIAL/PLATELET
Basophils Absolute: 0 10*3/uL (ref 0.0–0.2)
Basos: 1 %
EOS (ABSOLUTE): 0.1 10*3/uL (ref 0.0–0.4)
Eos: 2 %
Hematocrit: 42 % (ref 34.0–46.6)
Hemoglobin: 14.1 g/dL (ref 11.1–15.9)
Immature Grans (Abs): 0 10*3/uL (ref 0.0–0.1)
Immature Granulocytes: 1 %
Lymphocytes Absolute: 1.7 10*3/uL (ref 0.7–3.1)
Lymphs: 25 %
MCH: 30.4 pg (ref 26.6–33.0)
MCHC: 33.6 g/dL (ref 31.5–35.7)
MCV: 91 fL (ref 79–97)
Monocytes Absolute: 0.5 10*3/uL (ref 0.1–0.9)
Monocytes: 8 %
Neutrophils Absolute: 4.3 10*3/uL (ref 1.4–7.0)
Neutrophils: 63 %
Platelets: 277 10*3/uL (ref 150–450)
RBC: 4.64 x10E6/uL (ref 3.77–5.28)
RDW: 13 % (ref 11.7–15.4)
WBC: 6.7 10*3/uL (ref 3.4–10.8)

## 2020-11-27 LAB — COMPREHENSIVE METABOLIC PANEL
ALT: 19 IU/L (ref 0–32)
AST: 20 IU/L (ref 0–40)
Albumin/Globulin Ratio: 2.3 — ABNORMAL HIGH (ref 1.2–2.2)
Albumin: 4.8 g/dL (ref 3.8–4.9)
Alkaline Phosphatase: 79 IU/L (ref 44–121)
BUN/Creatinine Ratio: 15 (ref 9–23)
BUN: 11 mg/dL (ref 6–24)
Bilirubin Total: 0.4 mg/dL (ref 0.0–1.2)
CO2: 26 mmol/L (ref 20–29)
Calcium: 9.9 mg/dL (ref 8.7–10.2)
Chloride: 98 mmol/L (ref 96–106)
Creatinine, Ser: 0.73 mg/dL (ref 0.57–1.00)
Globulin, Total: 2.1 g/dL (ref 1.5–4.5)
Glucose: 93 mg/dL (ref 65–99)
Potassium: 4 mmol/L (ref 3.5–5.2)
Sodium: 139 mmol/L (ref 134–144)
Total Protein: 6.9 g/dL (ref 6.0–8.5)
eGFR: 98 mL/min/{1.73_m2} (ref >59)

## 2020-11-27 LAB — THYROID PANEL WITH TSH
Free Thyroxine Index: 2.6 (ref 1.2–4.9)
T3 Uptake Ratio: 27 % (ref 24–39)
T4, Total: 9.5 ug/dL (ref 4.5–12.0)
TSH: 0.621 u[IU]/mL (ref 0.450–4.500)

## 2020-11-29 ENCOUNTER — Other Ambulatory Visit: Payer: Self-pay

## 2020-11-29 MED FILL — Metformin HCl Tab ER 24HR 500 MG: ORAL | 30 days supply | Qty: 60 | Fill #1 | Status: AC

## 2020-11-29 MED FILL — Losartan Potassium Tab 50 MG: ORAL | 30 days supply | Qty: 30 | Fill #1 | Status: AC

## 2020-12-06 DIAGNOSIS — M542 Cervicalgia: Secondary | ICD-10-CM | POA: Diagnosis not present

## 2020-12-06 DIAGNOSIS — M9903 Segmental and somatic dysfunction of lumbar region: Secondary | ICD-10-CM | POA: Diagnosis not present

## 2020-12-06 DIAGNOSIS — M9901 Segmental and somatic dysfunction of cervical region: Secondary | ICD-10-CM | POA: Diagnosis not present

## 2020-12-06 DIAGNOSIS — M5431 Sciatica, right side: Secondary | ICD-10-CM | POA: Diagnosis not present

## 2020-12-08 DIAGNOSIS — M5431 Sciatica, right side: Secondary | ICD-10-CM | POA: Diagnosis not present

## 2020-12-08 DIAGNOSIS — M9903 Segmental and somatic dysfunction of lumbar region: Secondary | ICD-10-CM | POA: Diagnosis not present

## 2020-12-08 DIAGNOSIS — M542 Cervicalgia: Secondary | ICD-10-CM | POA: Diagnosis not present

## 2020-12-08 DIAGNOSIS — M9901 Segmental and somatic dysfunction of cervical region: Secondary | ICD-10-CM | POA: Diagnosis not present

## 2020-12-09 ENCOUNTER — Other Ambulatory Visit: Payer: Self-pay

## 2020-12-09 ENCOUNTER — Encounter: Payer: Self-pay | Admitting: General Surgery

## 2020-12-09 ENCOUNTER — Ambulatory Visit: Payer: Self-pay

## 2020-12-09 ENCOUNTER — Ambulatory Visit (INDEPENDENT_AMBULATORY_CARE_PROVIDER_SITE_OTHER): Payer: 59 | Admitting: General Surgery

## 2020-12-09 VITALS — BP 139/91 | HR 71 | Temp 98.3°F | Ht 63.0 in | Wt 173.2 lb

## 2020-12-09 DIAGNOSIS — R221 Localized swelling, mass and lump, neck: Secondary | ICD-10-CM

## 2020-12-09 NOTE — Patient Instructions (Addendum)
Follow-up with our office as needed.  Please call and ask to speak with a nurse if you develop questions or concerns.  You may call us directly to follow up here after you get your results of you thyroid biopsy if you would like to see Dr Celine Ahr to discuss any surgical options. You do not need a referral for this.

## 2020-12-14 ENCOUNTER — Ambulatory Visit
Admission: RE | Admit: 2020-12-14 | Discharge: 2020-12-14 | Disposition: A | Discharge status: Home / Self Care | Payer: 59 | Source: Ambulatory Visit | Attending: Endocrinology | Admitting: Endocrinology

## 2020-12-14 ENCOUNTER — Other Ambulatory Visit (HOSPITAL_COMMUNITY)
Admission: RE | Admit: 2020-12-14 | Discharge: 2020-12-14 | Disposition: A | Discharge status: Home / Self Care | Payer: 59 | Source: Ambulatory Visit | Attending: Endocrinology | Admitting: Endocrinology

## 2020-12-14 DIAGNOSIS — E041 Nontoxic single thyroid nodule: Secondary | ICD-10-CM | POA: Diagnosis not present

## 2020-12-14 DIAGNOSIS — E049 Nontoxic goiter, unspecified: Secondary | ICD-10-CM

## 2020-12-14 NOTE — Progress Notes (Signed)
Patient presented to [facility]  outpatient clinic for thyroid nodule biopsies of nodules #2 right mid and # 3 right inferior. Technically successful biopsy of nodule #3, right inferior. Nodule #2 (right mid) was deep and difficult to reach. Unable to verify needle placement within this nodule. After further review of the patient's 10/19/20 [place], Dr. Serafina Royals discussed with the patient about waiting to see what the results of nodule #2 are before re-attempting possible biopsy of nodule #2. The patient was in agreement with this plan. Samples for nodule #3 sent for pathology including afirma testing if needed. See dictation for full report.   Soyla Dryer, [place] [phone] 12/14/2020, 4:53 PM

## 2020-12-14 NOTE — Progress Notes (Signed)
Patient ID: Brittany Ochoa, adult   DOB: 05/05/1965, 55 y.o.   MRN: [id number]  Chief Complaint  Patient presents with   New Patient (Initial Visit)    Mass in neck     HPI Brittany Ochoa is a 55 y.o. adult.   She has a history of prior lipoma excision in the past, but she reports that due to the extent of the lesion and other structures with which it was intimately acquainted, she did not have a full resection and has known residual tissue in the area.  She says that she recently noticed a lump in her neck, just lateral to her sternocleidomastoid.  She also has multiple thyroid nodules and is scheduled to have a biopsy performed next week.  I initially thought she was being referred to me for her thyroid, but she states that Dr. Chalmers Cater is handling this.  When she learned that I specialize in thyroid surgery, however, she indicated that if she needed to have a thyroidectomy, she would like to have me perform it.  Today's visit, however, is in respect to this mass on the side of her neck.  She is concerned about it because she has a fairly extensive history of exposure to radioactive materials ([place]) and currently works in radiation therapy.  She is also worried about her thyroid nodules for the same reason.  The mass in her neck is not painful.  It does not seem to wax and wane, but she thinks that it has likely been growing slowly over time.  No erythema, induration, or drainage at the site.   Past Medical History:  Diagnosis Date   Allergy 2015   seasonal   Ascending aortic aneurysm ([place])    a. 06/2018 CTA Chest: 4.1 x 4.0 cm fusiform Asc Ao aneurysm-unchanged.  Descending thoracic aorta 2.1 cm.   Asthma    exercised induced   Dyspnea on exertion    a. 06/2017 echo: EF 60 to 65%.  No regional wall motion abnormalities.  Ascending aortic diameter 3.9 cm.  Mild MR.   Essential hypertension    Multiple thyroid nodules    OCD (obsessive compulsive disorder)    Reflux esophagitis     Past  Surgical History:  Procedure Laterality Date   [place]   breast reduction   ESOPHAGOGASTRODUODENOSCOPY (EGD) WITH PROPOFOL N/A 01/24/2018   Procedure: ESOPHAGOGASTRODUODENOSCOPY (EGD) WITH PROPOFOL;  Surgeon: [name], MD;  Location: [facility] ENDOSCOPY;  Service: Endoscopy;  Laterality: N/A;   ESOPHAGOGASTRODUODENOSCOPY (EGD) WITH PROPOFOL N/A 09/09/2020   Procedure: ESOPHAGOGASTRODUODENOSCOPY (EGD) WITH PROPOFOL;  Surgeon: [name], MD;  Location: [facility] ENDOSCOPY;  Service: Endoscopy;  Laterality: N/A;   lipoma removal  2007   back   REDUCTION MAMMAPLASTY Bilateral 12/01/1996    Family History  Problem Relation Age of Onset   Diabetes Mother    Arthritis Mother    Obesity Mother    Diabetes Father    Heart disease Father    Heart attack Father    Cancer Father        bone   Heart disease Paternal Grandmother    Cancer Paternal Grandmother        bone   Breast cancer Paternal Grandmother    Cancer Paternal Grandfather        pancreatic?   Heart disease Paternal Grandfather    Osteoporosis Maternal Grandmother    Arthritis Maternal Grandmother    Varicose Veins  Maternal Grandmother    Alcohol abuse Maternal Uncle    Asthma Brother    Anxiety disorder Brother    Cancer Paternal Aunt    Diabetes Paternal Aunt    Cancer Paternal Uncle     Social History Social History   Tobacco Use   Smoking status: Former    Packs/day: 2.00    Years: 25.00    Pack years: 50.00    Types: Cigarettes    Quit date: 04/17/2004    Years since quitting: 16.6   Smokeless tobacco: Never  Vaping Use   Vaping Use: Never used  Substance Use Topics   Alcohol use: Not Currently   Drug use: No    Comment: past only    Allergies  Allergen Reactions   Codeine    Medical Provider Ez Flu Shot [Influenza Vac Split Quad] Other (See Comments)    Severe myalgias and bone pain   Other Other (See Comments)    Other reaction(s):  Muscle Pain   Quinolones     Aortic enlargement     Current Outpatient Medications  Medication Sig Dispense Refill   LORazepam (ATIVAN) 0.5 MG tablet Take 0.5-1 tablets (0.25-0.5 mg total) by mouth daily as needed for anxiety. 20 tablet 0   losartan (COZAAR) 50 MG tablet TAKE 1 TABLET BY MOUTH DAILY. 30 tablet 3   metFORMIN (GLUCOPHAGE-XR) 500 MG 24 hr tablet TAKE 1 TABLET BY MOUTH TWICE DAILY 60 tablet 6   metoprolol succinate (TOPROL-XL) 25 MG 24 hr tablet Take 25 mg by mouth daily as needed.  (Patient not taking: Reported on 12/09/2020)     No current facility-administered medications for this visit.    Review of Systems Review of Systems  HENT:  Positive for tinnitus.   Gastrointestinal:        GERD  All other systems reviewed and are negative.  Blood pressure (!) 139/91, pulse 71, temperature 98.3 F (36.8 C), height 5' 3"  (1.6 m), weight 173 lb 3.2 oz (78.6 kg), SpO2 96 %. Body mass index is 30.68 kg/m.  Physical Exam Physical Exam Constitutional:      General: She is not in acute distress.    Appearance: She is obese.  HENT:     Head: Normocephalic and atraumatic.     Nose:     Comments: Covered with a mask    Mouth/Throat:     Comments: Covered with a mask Eyes:     General: No scleral icterus.       Right eye: No discharge.        Left eye: No discharge.  Neck:     Comments: In the area of concern, there is a soft and well-circumscribed mass.  It is nontender and is not fixed to any of the surrounding structures.  It is very close to her scar from her prior lipoma resection.  There is no palpable cervical or supraclavicular lymphadenopathy.  The trachea is midline.  I do not appreciate any thyromegaly or dominant thyroid masses on exam.  The gland was freely with deglutition. Cardiovascular:     Rate and Rhythm: Normal rate and regular rhythm.  Pulmonary:     Effort: Pulmonary effort is normal.     Breath sounds: Normal breath sounds.  Abdominal:      General: Bowel sounds are normal.     Palpations: Abdomen is soft.  Genitourinary:    Comments: Deferred Musculoskeletal:        General: No swelling or tenderness.  Skin:  General: Skin is warm and dry.  Neurological:     General: No focal deficit present.     Mental Status: She is alert and oriented to person, place, and time.  Psychiatric:        Mood and Affect: Mood normal.        Behavior: Behavior normal.    Data Reviewed I reviewed the ultrasound that was performed recently.  Multiple thyroid nodules were identified.  None of these met criteria for biopsy, but several met criteria for surveillance.  Results for SADIA, BELFIORE (MRN [id number]) as of 12/14/2020 12:57  Ref. Range 11/26/2020 15:39  Sodium Latest Ref Range: 134 - 144 mmol/L 139  Potassium Latest Ref Range: 3.5 - 5.2 mmol/L 4.0  Chloride Latest Ref Range: 96 - 106 mmol/L 98  CO2 Latest Ref Range: 20 - 29 mmol/L 26  Glucose Latest Ref Range: 65 - 99 mg/dL 93  BUN Latest Ref Range: 6 - 24 mg/dL 11  Creatinine Latest Ref Range: 0.57 - 1.00 mg/dL 0.73  Calcium Latest Ref Range: 8.7 - 10.2 mg/dL 9.9  BUN/Creatinine Ratio Latest Ref Range: 9 - 23  15  eGFR Latest Ref Range: >59 mL/min/1.73 98  Alkaline Phosphatase Latest Ref Range: 44 - 121 IU/L 79  Albumin Latest Ref Range: 3.8 - 4.9 g/dL 4.8  Albumin/Globulin Ratio Latest Ref Range: 1.2 - 2.2  2.3 (H)  AST Latest Ref Range: 0 - 40 IU/L 20  ALT Latest Ref Range: 0 - 32 IU/L 19  Total Protein Latest Ref Range: 6.0 - 8.5 g/dL 6.9  Total Bilirubin Latest Ref Range: 0.0 - 1.2 mg/dL 0.4  Globulin, Total Latest Ref Range: 1.5 - 4.5 g/dL 2.1  WBC Latest Ref Range: 3.4 - 10.8 x10E3/uL 6.7  RBC Latest Ref Range: 3.77 - 5.28 x10E6/uL 4.64  Hemoglobin Latest Ref Range: 11.1 - 15.9 g/dL 14.1  HCT Latest Ref Range: 34.0 - 46.6 % 42.0  MCV Latest Ref Range: 79 - 97 fL 91  MCH Latest Ref Range: 26.6 - 33.0 pg 30.4  MCHC Latest Ref Range: 31.5 - 35.7 g/dL 33.6  RDW Latest  Ref Range: 11.7 - 15.4 % 13.0  Platelets Latest Ref Range: 150 - 450 x10E3/uL 277  Neutrophils Latest Ref Range: Not Estab. % 63  Immature Granulocytes Latest Ref Range: Not Estab. % 1  NEUT# Latest Ref Range: 1.4 - 7.0 x10E3/uL 4.3  Lymphocyte # Latest Ref Range: 0.7 - 3.1 x10E3/uL 1.7  Monocytes Absolute Latest Ref Range: 0.1 - 0.9 x10E3/uL 0.5  Basophils Absolute Latest Ref Range: 0.0 - 0.2 x10E3/uL 0.0  Immature Grans (Abs) Latest Ref Range: 0.0 - 0.1 x10E3/uL 0.0  Lymphs Latest Ref Range: Not Estab. % 25  Monocytes Latest Ref Range: Not Estab. % 8  Basos Latest Ref Range: Not Estab. % 1  Eos Latest Ref Range: Not Estab. % 2  EOS (ABSOLUTE) Latest Ref Range: 0.0 - 0.4 x10E3/uL 0.1  TSH Latest Ref Range: 0.450 - 4.500 uIU/mL 0.621  Thyroxine (T4) Latest Ref Range: 4.5 - 12.0 ug/dL 9.5  Free Thyroxine Index Latest Ref Range: 1.2 - 4.9  2.6  T3 Uptake Ratio Latest Ref Range: 24 - 39 % 27  These labs do not demonstrate any significant abnormalities; specifically, her thyroid function is normal.  I performed a bedside ultrasound of the mass in her neck.  Findings were suggestive of a lipoma.  Assessment This is a 55 year old woman with extensive exposure to radiation.  She has a multinodular  goiter and I agree that despite the sonographic findings, she should have biopsies performed due to this exposure.  The mass in her lateral left neck is consistent with a lipoma both on exam and sonographic imaging.  Plan Ms. Eldredge will have her biopsy performed on December 14, 2020.  Pending those results, she may contact me to pursue surgical intervention, if it ends up being warranted.  I told her that the lipoma in her neck did not require excision unless she wished to have that done and that if she did pursue thyroid surgery, I could perform the lipoma removal at the same time.  We will await the results of her biopsy and schedule follow-up as indicated.    Fredirick Maudlin 12/14/2020, 12:45  PM

## 2020-12-16 LAB — CYTOLOGY - NON PAP

## 2020-12-24 ENCOUNTER — Other Ambulatory Visit: Payer: Self-pay

## 2020-12-24 ENCOUNTER — Telehealth: Payer: 59 | Admitting: Nurse Practitioner

## 2020-12-24 DIAGNOSIS — N3000 Acute cystitis without hematuria: Secondary | ICD-10-CM

## 2020-12-24 MED ORDER — CEPHALEXIN 500 MG PO CAPS
500.0000 mg | ORAL_CAPSULE | Freq: Two times a day (BID) | ORAL | 0 refills | Status: DC
  Filled 2020-12-24: qty 14, 7d supply, fill #0

## 2020-12-24 NOTE — Progress Notes (Signed)

## 2021-01-17 ENCOUNTER — Other Ambulatory Visit: Payer: Self-pay

## 2021-01-17 MED ORDER — METFORMIN HCL ER 500 MG PO TB24
500.0000 mg | ORAL_TABLET | Freq: Two times a day (BID) | ORAL | 6 refills | Status: DC
  Filled 2021-01-17: qty 60, 30d supply, fill #0
  Filled 2021-02-21: qty 60, 30d supply, fill #1
  Filled 2021-03-25: qty 60, 30d supply, fill #2
  Filled 2021-04-28: qty 60, 30d supply, fill #3
  Filled 2021-06-08: qty 60, 30d supply, fill #4
  Filled 2021-07-06: qty 60, 30d supply, fill #5
  Filled 2021-08-04: qty 60, 30d supply, fill #6

## 2021-01-19 DIAGNOSIS — M5431 Sciatica, right side: Secondary | ICD-10-CM | POA: Diagnosis not present

## 2021-01-19 DIAGNOSIS — M542 Cervicalgia: Secondary | ICD-10-CM | POA: Diagnosis not present

## 2021-01-19 DIAGNOSIS — M9901 Segmental and somatic dysfunction of cervical region: Secondary | ICD-10-CM | POA: Diagnosis not present

## 2021-01-19 DIAGNOSIS — M9903 Segmental and somatic dysfunction of lumbar region: Secondary | ICD-10-CM | POA: Diagnosis not present

## 2021-01-20 ENCOUNTER — Ambulatory Visit (INDEPENDENT_AMBULATORY_CARE_PROVIDER_SITE_OTHER): Payer: 59 | Admitting: Gastroenterology

## 2021-01-20 ENCOUNTER — Other Ambulatory Visit: Payer: Self-pay

## 2021-01-20 ENCOUNTER — Encounter: Payer: Self-pay | Admitting: Gastroenterology

## 2021-01-20 VITALS — BP 135/85 | HR 76 | Temp 98.1°F | Wt 165.6 lb

## 2021-01-20 DIAGNOSIS — K31A0 Gastric intestinal metaplasia, unspecified: Secondary | ICD-10-CM

## 2021-01-20 NOTE — Addendum Note (Signed)
Addended by: Lurlean Nanny on: 01/20/2021 04:07 PM   Modules accepted: Orders, SmartSet

## 2021-01-20 NOTE — Progress Notes (Signed)
Brittany Antigua, MD [address]  [place]  [place], [place] [address]  Main: [phone]  Fax: [phone]   Primary Care Physician: [name], DO   Chief Complaint  Patient presents with   Follow-up    there were changes seen in the stomach called "intestinal metaplasia", repeat biopsies in around 6 to 12 months     HPI: Brittany Ochoa is a 55 y.o. adult with history of gastric intestinal metaplasia here for follow-up.  Patient denies any further dysphagia or abdominal pain.  Denies any reflux.  Is not taking any PPI.  States has been eating healthier, and has intentionally lost weight due to this and feels much better.  No nausea or vomiting.  No altered bowel habits.  No blood in stool.  Obtains CRC screening by her PCP via Cologuard testing  Document weights show a 30 lb weight loss since May consistent with patients reported history  Labs reassuring with normal CBC and liver enzymes in Aug 2022. Hep C Ab Neg June 2022  ROS: All ROS reviewed and negative except as per HPI   Past Medical History:  Diagnosis Date   Allergy 2015   seasonal   Ascending aortic aneurysm    a. 06/2018 CTA Chest: 4.1 x 4.0 cm fusiform Asc Ao aneurysm-unchanged.  Descending thoracic aorta 2.1 cm.   Asthma    exercised induced   Dyspnea on exertion    a. 06/2017 echo: EF 60 to 65%.  No regional wall motion abnormalities.  Ascending aortic diameter 3.9 cm.  Mild MR.   Essential hypertension    Multiple thyroid nodules    OCD (obsessive compulsive disorder)    Reflux esophagitis     Past Surgical History:  Procedure Laterality Date   [place]   breast reduction   ESOPHAGOGASTRODUODENOSCOPY (EGD) WITH PROPOFOL N/A 01/24/2018   Procedure: ESOPHAGOGASTRODUODENOSCOPY (EGD) WITH PROPOFOL;  Surgeon: [name], MD;  Location: [facility] ENDOSCOPY;  Service: Endoscopy;  Laterality: N/A;   ESOPHAGOGASTRODUODENOSCOPY (EGD) WITH  PROPOFOL N/A 09/09/2020   Procedure: ESOPHAGOGASTRODUODENOSCOPY (EGD) WITH PROPOFOL;  Surgeon: [name], MD;  Location: [facility] ENDOSCOPY;  Service: Endoscopy;  Laterality: N/A;   lipoma removal  2007   back   REDUCTION MAMMAPLASTY Bilateral 12/01/1996    Prior to Admission medications   Medication Sig Start Date End Date Taking? Authorizing Provider  LORazepam (ATIVAN) 0.5 MG tablet Take 0.5-1 tablets (0.25-0.5 mg total) by mouth daily as needed for anxiety. 10/08/20  Yes Johnson, Megan P, DO  metFORMIN (GLUCOPHAGE-XR) 500 MG 24 hr tablet TAKE 1 TABLET BY MOUTH TWICE DAILY 01/17/21  Yes   metoprolol succinate (TOPROL-XL) 25 MG 24 hr tablet Take 25 mg by mouth daily as needed.   Yes [provider]  metoprolol tartrate (LOPRESSOR) 25 MG tablet Take 1 tablet (25 mg total) by mouth 2 (two) times daily. 12/04/19 02/13/20  [name], MD    Family History  Problem Relation Age of Onset   Diabetes Mother    Arthritis Mother    Obesity Mother    Diabetes Father    Heart disease Father    Heart attack Father    Cancer Father        bone   Heart disease Paternal Grandmother    Cancer Paternal Grandmother        bone   Breast cancer Paternal Grandmother    Cancer Paternal Grandfather  pancreatic?   Heart disease Paternal Grandfather    Osteoporosis Maternal Grandmother    Arthritis Maternal Grandmother    Varicose Veins Maternal Grandmother    Alcohol abuse Maternal Uncle    Asthma Brother    Anxiety disorder Brother    Cancer Paternal Aunt    Diabetes Paternal Aunt    Cancer Paternal Uncle      Social History   Tobacco Use   Smoking status: Former    Packs/day: 2.00    Years: 25.00    Pack years: 50.00    Types: Cigarettes    Quit date: 04/17/2004    Years since quitting: 16.7   Smokeless tobacco: Never  Vaping Use   Vaping Use: Never used  Substance Use Topics   Alcohol use: Not Currently   Drug use: No    Comment: past only    Allergies  as of 01/20/2021 - Review Complete 01/20/2021  Allergen Reaction Noted   Codeine  08/18/2015   Medical provider ez flu shot [influenza vac split quad] Other (See Comments) 05/27/2019   Other Other (See Comments) 01/15/2017   Quinolones  10/30/2019    Physical Examination:  Constitutional: General:   Alert,  Well-developed, well-nourished, pleasant and cooperative in NAD BP 135/85   Pulse 76   Temp 98.1 F (36.7 C) (Oral)   Wt 165 lb 9.6 oz (75.1 kg)   BMI 29.33 kg/m   Respiratory: Normal respiratory effort  Gastrointestinal:  Soft, non-tender and non-distended without masses, hepatosplenomegaly or hernias noted.  No guarding or rebound tenderness.     Cardiac: No clubbing or edema.  No cyanosis. Normal posterior tibial pedal pulses noted.  Psych:  Alert and cooperative. Normal mood and affect.  Musculoskeletal:  Normal gait. Head normocephalic, atraumatic. Symmetrical without gross deformities. 5/5 Lower extremity strength bilaterally.  Skin: Warm. Intact without significant lesions or rashes. No jaundice.  Neck: Supple, trachea midline  Lymph: No cervical lymphadenopathy  Psych:  Alert and oriented x3, Alert and cooperative. Normal mood and affect.  Labs: CMP     Component Value Date/Time   NA 139 11/26/2020 1539   K 4.0 11/26/2020 1539   CL 98 11/26/2020 1539   CO2 26 11/26/2020 1539   GLUCOSE 93 11/26/2020 1539   GLUCOSE 94 09/17/2020 1236   BUN 11 11/26/2020 1539   CREATININE 0.73 11/26/2020 1539   CALCIUM 9.9 11/26/2020 1539   PROT 6.9 11/26/2020 1539   ALBUMIN 4.8 11/26/2020 1539   AST 20 11/26/2020 1539   ALT 19 11/26/2020 1539   ALKPHOS 79 11/26/2020 1539   BILITOT 0.4 11/26/2020 1539   GFRNONAA >60 09/17/2020 1236   GFRAA >60 01/15/2020 1415   Lab Results  Component Value Date   WBC 6.7 11/26/2020   HGB 14.1 11/26/2020   HCT 42.0 11/26/2020   MCV 91 11/26/2020   PLT 277 11/26/2020   Hep C Ab <0.16 September 2020  Imaging  Studies:   Assessment and Plan:   Brittany Ochoa is a 55 y.o. y/o adult with history of gastric intestinal metaplasia here for follow-up  EGD indicated for gastric mapping biopsies  Dysphagia and reflux symptoms have completely resolved  Patient describes history of gastric cancer in her father, paternal grandmother and paternal great grandparent.  Reports other cancers in cousins and aunts on father side as well.  I did discuss genetic counseling referral with the patient, but patient does not interested and refuses at this time  I have discussed alternative options, risks &  benefits,  which include, but are not limited to, bleeding, infection, perforation,respiratory complication & drug reaction.  The patient agrees with this plan & written consent will be obtained.       Dr Brittany Ochoa

## 2021-01-21 DIAGNOSIS — M9903 Segmental and somatic dysfunction of lumbar region: Secondary | ICD-10-CM | POA: Diagnosis not present

## 2021-01-21 DIAGNOSIS — M5431 Sciatica, right side: Secondary | ICD-10-CM | POA: Diagnosis not present

## 2021-01-21 DIAGNOSIS — M542 Cervicalgia: Secondary | ICD-10-CM | POA: Diagnosis not present

## 2021-01-21 DIAGNOSIS — M9901 Segmental and somatic dysfunction of cervical region: Secondary | ICD-10-CM | POA: Diagnosis not present

## 2021-01-24 ENCOUNTER — Encounter: Payer: Self-pay | Admitting: Family Medicine

## 2021-01-24 ENCOUNTER — Encounter: Payer: Self-pay | Admitting: General Surgery

## 2021-01-31 NOTE — Telephone Encounter (Addendum)
Patient dropped off flu vaccination accommodation request form.  Patient has asked for provider/clinical staff to respond via MyChart when paperwork is ready.  Placed in provider's folder.

## 2021-02-02 DIAGNOSIS — M542 Cervicalgia: Secondary | ICD-10-CM | POA: Diagnosis not present

## 2021-02-02 DIAGNOSIS — M9903 Segmental and somatic dysfunction of lumbar region: Secondary | ICD-10-CM | POA: Diagnosis not present

## 2021-02-02 DIAGNOSIS — M5431 Sciatica, right side: Secondary | ICD-10-CM | POA: Diagnosis not present

## 2021-02-02 DIAGNOSIS — M9901 Segmental and somatic dysfunction of cervical region: Secondary | ICD-10-CM | POA: Diagnosis not present

## 2021-02-17 ENCOUNTER — Encounter: Payer: Self-pay | Admitting: Gastroenterology

## 2021-02-17 ENCOUNTER — Ambulatory Visit: Payer: 59 | Admitting: Anesthesiology

## 2021-02-17 ENCOUNTER — Other Ambulatory Visit: Payer: Self-pay

## 2021-02-17 ENCOUNTER — Ambulatory Visit
Admission: RE | Admit: 2021-02-17 | Discharge: 2021-02-17 | Disposition: A | Discharge status: Home / Self Care | Payer: 59 | Attending: Gastroenterology | Admitting: Gastroenterology

## 2021-02-17 ENCOUNTER — Encounter
Admission: RE | Disposition: A | Discharge status: Home / Self Care | Payer: Self-pay | Source: Home / Self Care | Attending: Gastroenterology

## 2021-02-17 DIAGNOSIS — K319 Disease of stomach and duodenum, unspecified: Secondary | ICD-10-CM

## 2021-02-17 DIAGNOSIS — K2950 Unspecified chronic gastritis without bleeding: Secondary | ICD-10-CM | POA: Insufficient documentation

## 2021-02-17 DIAGNOSIS — Z885 Allergy status to narcotic agent status: Secondary | ICD-10-CM | POA: Diagnosis not present

## 2021-02-17 DIAGNOSIS — Z881 Allergy status to other antibiotic agents status: Secondary | ICD-10-CM | POA: Insufficient documentation

## 2021-02-17 DIAGNOSIS — I10 Essential (primary) hypertension: Secondary | ICD-10-CM | POA: Insufficient documentation

## 2021-02-17 DIAGNOSIS — Z7984 Long term (current) use of oral hypoglycemic drugs: Secondary | ICD-10-CM | POA: Insufficient documentation

## 2021-02-17 DIAGNOSIS — K31A0 Gastric intestinal metaplasia, unspecified: Secondary | ICD-10-CM | POA: Diagnosis not present

## 2021-02-17 DIAGNOSIS — R1310 Dysphagia, unspecified: Secondary | ICD-10-CM | POA: Diagnosis not present

## 2021-02-17 DIAGNOSIS — Z79899 Other long term (current) drug therapy: Secondary | ICD-10-CM | POA: Insufficient documentation

## 2021-02-17 DIAGNOSIS — K3189 Other diseases of stomach and duodenum: Secondary | ICD-10-CM | POA: Diagnosis not present

## 2021-02-17 DIAGNOSIS — Z87891 Personal history of nicotine dependence: Secondary | ICD-10-CM | POA: Insufficient documentation

## 2021-02-17 HISTORY — PX: ESOPHAGOGASTRODUODENOSCOPY (EGD) WITH PROPOFOL: SHX5813

## 2021-02-17 SURGERY — ESOPHAGOGASTRODUODENOSCOPY (EGD) WITH PROPOFOL
Anesthesia: General

## 2021-02-17 MED ORDER — PROPOFOL 500 MG/50ML IV EMUL
INTRAVENOUS | Status: DC | PRN
  Administered 2021-02-17: 150 ug/kg/min via INTRAVENOUS

## 2021-02-17 MED ORDER — SODIUM CHLORIDE 0.9 % IV SOLN: INTRAVENOUS | Status: DC

## 2021-02-17 MED ORDER — PROPOFOL 500 MG/50ML IV EMUL
INTRAVENOUS | Status: AC
  Filled 2021-02-17: qty 50

## 2021-02-17 MED ORDER — LIDOCAINE HCL (PF) 2 % IJ SOLN
INTRAMUSCULAR | Status: AC
  Filled 2021-02-17: qty 5

## 2021-02-17 MED ORDER — LIDOCAINE HCL (CARDIAC) PF 100 MG/5ML IV SOSY
PREFILLED_SYRINGE | INTRAVENOUS | Status: DC | PRN
  Administered 2021-02-17: 50 mg via INTRAVENOUS

## 2021-02-17 NOTE — Anesthesia Postprocedure Evaluation (Signed)
Anesthesia Post Note  Patient: Brittany Ochoa  Procedure(s) Performed: ESOPHAGOGASTRODUODENOSCOPY (EGD) WITH PROPOFOL  Patient location during evaluation: PACU Anesthesia Type: General Level of consciousness: awake, awake and alert and oriented Pain management: pain level controlled Vital Signs Assessment: post-procedure vital signs reviewed and stable Respiratory status: spontaneous breathing and respiratory function stable Cardiovascular status: blood pressure returned to baseline Anesthetic complications: no   No notable events documented.   Last Vitals:  Vitals:   02/17/21 0822 02/17/21 0832  BP: 117/85 132/68  Pulse: 60 (!) 56  Resp: (!) 27 19  Temp:    SpO2: 98% 99%    Last Pain:  Vitals:   02/17/21 0832  TempSrc:   PainSc: 0-No pain                 VAN STAVEREN,Edia Pursifull

## 2021-02-17 NOTE — Anesthesia Preprocedure Evaluation (Signed)
Anesthesia Evaluation  Patient identified by MRN, date of birth, ID band Patient awake    Airway Mallampati: II       Dental  (+) Teeth Intact   Pulmonary neg pulmonary ROS, former smoker,    breath sounds clear to auscultation       Cardiovascular hypertension, Pt. on medications  Rhythm:Regular Rate:Normal     Neuro/Psych PSYCHIATRIC DISORDERS Anxiety    GI/Hepatic negative GI ROS, Neg liver ROS,   Endo/Other  negative endocrine ROS  Renal/GU negative Renal ROS  negative genitourinary   Musculoskeletal   Abdominal Normal abdominal exam  (+)   Peds negative pediatric ROS (+)  Hematology negative hematology ROS (+)   Anesthesia Other Findings   Reproductive/Obstetrics                             Anesthesia Physical Anesthesia Plan  ASA: 2  Anesthesia Plan: General   Post-op Pain Management:    Induction: Intravenous  PONV Risk Score and Plan:   Airway Management Planned: Nasal Cannula  Additional Equipment:   Intra-op Plan:   Post-operative Plan:   Informed Consent: I have reviewed the patients History and Physical, chart, labs and discussed the procedure including the risks, benefits and alternatives for the proposed anesthesia with the patient or authorized representative who has indicated his/her understanding and acceptance.       Plan Discussed with: CRNA and Surgeon  Anesthesia Plan Comments:         Anesthesia Quick Evaluation

## 2021-02-17 NOTE — Anesthesia Procedure Notes (Signed)
Date/Time: 02/17/2021 7:51 AM Performed by: Vaughan Sine Pre-anesthesia Checklist: Patient identified, Emergency Drugs available, Suction available, Patient being monitored and Timeout performed Patient Re-evaluated:Patient Re-evaluated prior to induction Oxygen Delivery Method: Nasal cannula Preoxygenation: Pre-oxygenation with 100% oxygen Induction Type: IV induction Airway Equipment and Method: Bite block

## 2021-02-17 NOTE — Op Note (Signed)
[facility] Gastroenterology Patient Name: Brittany Ochoa Procedure Date: 02/17/2021 7:42 AM MRN: [id number] Account #: [id number] Date of Birth: Feb 15, 1966 Admit Type: Outpatient Age: 55 Room: [facility] ENDO ROOM 2 Gender: Female Note Status: Finalized Instrument Name: Upper Endoscope [id number] Procedure:             Upper GI endoscopy Indications:           Follow-up of intestinal metaplasia Providers:             Anisten Tomassi B. Bonna Gains MD, MD Medicines:             Monitored Anesthesia Care Complications:         No immediate complications. Procedure:             Pre-Anesthesia Assessment:                        - Prior to the procedure, a History and Physical was                         performed, and patient medications, allergies and                         sensitivities were reviewed. The patient's tolerance                         of previous anesthesia was reviewed.                        - The risks and benefits of the procedure and the                         sedation options and risks were discussed with the                         patient. All questions were answered and informed                         consent was obtained.                        - Patient identification and proposed procedure were                         verified prior to the procedure by the physician, the                         nurse, the anesthesiologist, the anesthetist and the                         technician. The procedure was verified in the                         procedure room.                        - ASA Grade Assessment: II - A patient with mild                         systemic disease.  After obtaining informed consent, the endoscope was                         passed under direct vision. Throughout the procedure,                         the patient's blood pressure, pulse, and oxygen                         saturations were monitored continuously. The                          Endosonoscope was introduced through the mouth, and                         advanced to the second part of duodenum. The upper GI                         endoscopy was accomplished with ease. The patient                         tolerated the procedure well. Findings:      The examined esophagus was normal.      There is no endoscopic evidence of stenosis or stricture in the entire       esophagus.      Patchy mildly erythematous mucosa without bleeding was found in the       gastric antrum. Biopsies were taken with a cold forceps for histology.       Biopsies were obtained in the gastric body, at the incisura and in the       gastric antrum with cold forceps for histology.      The exam of the stomach was otherwise normal.      The duodenal bulb, second portion of the duodenum and examined duodenum       were normal. Impression:            - Normal esophagus.                        - Erythematous mucosa in the antrum. Biopsied.                        - Normal duodenal bulb, second portion of the duodenum                         and examined duodenum.                        - Biopsies were obtained in the gastric body, at the                         incisura and in the gastric antrum. Recommendation:        - Await pathology results.                        - Discharge patient to home (with escort).                        - Advance diet as tolerated.                        -  Continue present medications.                        - Patient has a contact number available for                         emergencies. The signs and symptoms of potential                         delayed complications were discussed with the patient.                         Return to normal activities tomorrow. Written                         discharge instructions were provided to the patient.                        - Discharge patient to home (with escort).                        - The  findings and recommendations were discussed with                         the patient.                        - The findings and recommendations were discussed with                         the patient's family. Procedure Code(s):     --- Professional ---                        [phone], Esophagogastroduodenoscopy, flexible,                         transoral; with biopsy, single or multiple Diagnosis Code(s):     --- Professional ---                        K31.89, Other diseases of stomach and duodenum CPT copyright 2019 American Medical Association. All rights reserved. The codes documented in this report are preliminary and upon coder review may  be revised to meet current compliance requirements.  [name], MD Margretta Sidle B. Bonna Gains MD, MD 02/17/2021 8:12:01 AM This report has been signed electronically. Number of Addenda: 0 Note Initiated On: 02/17/2021 7:42 AM Estimated Blood Loss:  Estimated blood loss: none.      [facility]

## 2021-02-17 NOTE — H&P (Signed)
Brittany Antigua, MD [address], [place], [place], [place], [address] [address], [place], [place], [place], [address] Phone: [phone]  Fax: [phone]  Primary Care Physician:  [name], DO   Pre-Procedure History & Physical: HPI:  Brittany Ochoa is a 55 y.o. adult is here for an EGD.   Past Medical History:  Diagnosis Date   Allergy 2015   seasonal   Ascending aortic aneurysm    a. 06/2018 CTA Chest: 4.1 x 4.0 cm fusiform Asc Ao aneurysm-unchanged.  Descending thoracic aorta 2.1 cm.   Asthma    exercised induced   Dyspnea on exertion    a. 06/2017 echo: EF 60 to 65%.  No regional wall motion abnormalities.  Ascending aortic diameter 3.9 cm.  Mild MR.   Essential hypertension    Multiple thyroid nodules    OCD (obsessive compulsive disorder)    Reflux esophagitis     Past Surgical History:  Procedure Laterality Date   [place]   breast reduction   ESOPHAGOGASTRODUODENOSCOPY (EGD) WITH PROPOFOL N/A 01/24/2018   Procedure: ESOPHAGOGASTRODUODENOSCOPY (EGD) WITH PROPOFOL;  Surgeon: [name], MD;  Location: [facility] ENDOSCOPY;  Service: Endoscopy;  Laterality: N/A;   ESOPHAGOGASTRODUODENOSCOPY (EGD) WITH PROPOFOL N/A 09/09/2020   Procedure: ESOPHAGOGASTRODUODENOSCOPY (EGD) WITH PROPOFOL;  Surgeon: [name], MD;  Location: [facility] ENDOSCOPY;  Service: Endoscopy;  Laterality: N/A;   lipoma removal  2007   back   REDUCTION MAMMAPLASTY Bilateral 12/01/1996    Prior to Admission medications   Medication Sig Start Date End Date Taking? Authorizing Provider  metFORMIN (GLUCOPHAGE-XR) 500 MG 24 hr tablet TAKE 1 TABLET BY MOUTH TWICE DAILY 01/17/21  Yes   LORazepam (ATIVAN) 0.5 MG tablet Take 0.5-1 tablets (0.25-0.5 mg total) by mouth daily as needed for anxiety. Patient not taking: Reported on 02/17/2021 10/08/20   Park Liter P, DO  metoprolol succinate (TOPROL-XL) 25 MG 24 hr tablet Take 25 mg  by mouth daily as needed. Patient not taking: Reported on 02/17/2021    [provider]  metoprolol tartrate (LOPRESSOR) 25 MG tablet Take 1 tablet (25 mg total) by mouth 2 (two) times daily. 12/04/19 02/13/20  [name], MD    Allergies as of 01/21/2021 - Review Complete 01/20/2021  Allergen Reaction Noted   Codeine  08/18/2015   Medical provider ez flu shot [influenza vac split quad] Other (See Comments) 05/27/2019   Other Other (See Comments) 01/15/2017   Quinolones  10/30/2019    Family History  Problem Relation Age of Onset   Diabetes Mother    Arthritis Mother    Obesity Mother    Diabetes Father    Heart disease Father    Heart attack Father    Cancer Father        bone   Heart disease Paternal Grandmother    Cancer Paternal Grandmother        bone   Breast cancer Paternal Grandmother    Cancer Paternal Grandfather        pancreatic?   Heart disease Paternal Grandfather    Osteoporosis Maternal Grandmother    Arthritis Maternal Grandmother    Varicose Veins Maternal Grandmother    Alcohol abuse Maternal Uncle    Asthma Brother    Anxiety disorder Brother    Cancer Paternal Aunt    Diabetes Paternal Aunt    Cancer Paternal Uncle     Social History   Socioeconomic History  Marital status: Single    Spouse name: Not on file   Number of children: Not on file   Years of education: Not on file   Highest education level: Not on file  Occupational History   Not on file  Tobacco Use   Smoking status: Former    Packs/day: 2.00    Years: 25.00    Pack years: 50.00    Types: Cigarettes    Quit date: 04/17/2004    Years since quitting: 16.8   Smokeless tobacco: Never  Vaping Use   Vaping Use: Never used  Substance and Sexual Activity   Alcohol use: Not Currently   Drug use: No    Comment: past only   Sexual activity: Not Currently  Other Topics Concern   Not on file  Social History Narrative   Not on file   Social Determinants of Health    Financial Resource Strain: Not on file  Food Insecurity: Not on file  Transportation Needs: Not on file  Physical Activity: Not on file  Stress: Not on file  Social Connections: Not on file  Intimate Partner Violence: Not on file    Review of Systems: See HPI, otherwise negative ROS  Constitutional: General:   Alert,  Well-developed, well-nourished, pleasant and cooperative in NAD BP 124/79   Pulse 63   Temp (!) 96.3 F (35.7 C) (Temporal)   Resp 20   Ht 5\' 3"  (1.6 m)   Wt 71.7 kg   SpO2 97%   BMI 27.99 kg/m   Head: Normocephalic, atraumatic.   Eyes:  Sclera clear, no icterus.   Conjunctiva pink.   Mouth:  No deformity or lesions, oropharynx pink & moist.  Neck:  Supple, trachea midline  Respiratory: Normal respiratory effort  Gastrointestinal:  Soft, non-tender and non-distended without masses, hepatosplenomegaly or hernias noted.  No guarding or rebound tenderness.     Cardiac: No clubbing or edema.  No cyanosis. Normal posterior tibial pedal pulses noted.  Lymphatic:  No significant cervical adenopathy.  Psych:  Alert and cooperative. Normal mood and affect.  Musculoskeletal:   Symmetrical without gross deformities. 5/5 Lower extremity strength bilaterally.  Skin: Warm. Intact without significant lesions or rashes. No jaundice.  Neurologic:  Face symmetrical, tongue midline, Normal sensation to touch;  grossly normal neurologically.  Psych:  Alert and oriented x3, Alert and cooperative. Normal mood and affect.  Impression/Plan: Brittany Ochoa is here for an EGD for gastric intestinal metaplasia and dysphagia  Risks, benefits, limitations, and alternatives regarding the procedure have been reviewed with the patient.  Questions have been answered.  All parties agreeable.   [name], MD  02/17/2021, 7:43 AM

## 2021-02-17 NOTE — Transfer of Care (Signed)
Immediate Anesthesia Transfer of Care Note  Patient: Brittany Ochoa  Procedure(s) Performed: ESOPHAGOGASTRODUODENOSCOPY (EGD) WITH PROPOFOL  Patient Location: PACU  Anesthesia Type:General  Level of Consciousness: awake and sedated  Airway & Oxygen Therapy: Patient Spontanous Breathing and Patient connected to nasal cannula oxygen  Post-op Assessment: Report given to RN and Post -op Vital signs reviewed and stable  Post vital signs: Reviewed and stable  Last Vitals:  Vitals Value Taken Time  BP    Temp    Pulse    Resp    SpO2      Last Pain:  Vitals:   02/17/21 0723  TempSrc: Temporal  PainSc: 0-No pain         Complications: No notable events documented.

## 2021-02-18 ENCOUNTER — Encounter: Payer: Self-pay | Admitting: Gastroenterology

## 2021-02-18 LAB — SURGICAL PATHOLOGY

## 2021-02-21 ENCOUNTER — Other Ambulatory Visit: Payer: Self-pay

## 2021-02-24 ENCOUNTER — Other Ambulatory Visit: Payer: Self-pay

## 2021-02-24 MED ORDER — SUCRALFATE 1 G PO TABS
1.0000 g | ORAL_TABLET | Freq: Four times a day (QID) | ORAL | 0 refills | Status: DC | PRN
  Filled 2021-02-24: qty 56, 14d supply, fill #0

## 2021-02-24 MED ORDER — OMEPRAZOLE 40 MG PO CPDR
40.0000 mg | DELAYED_RELEASE_CAPSULE | Freq: Every day | ORAL | 0 refills | Status: DC
  Filled 2021-02-24: qty 14, 14d supply, fill #0

## 2021-02-28 DIAGNOSIS — M9905 Segmental and somatic dysfunction of pelvic region: Secondary | ICD-10-CM | POA: Diagnosis not present

## 2021-02-28 DIAGNOSIS — M9903 Segmental and somatic dysfunction of lumbar region: Secondary | ICD-10-CM | POA: Diagnosis not present

## 2021-02-28 DIAGNOSIS — M62830 Muscle spasm of back: Secondary | ICD-10-CM | POA: Diagnosis not present

## 2021-02-28 DIAGNOSIS — M955 Acquired deformity of pelvis: Secondary | ICD-10-CM | POA: Diagnosis not present

## 2021-03-01 ENCOUNTER — Encounter: Payer: Self-pay | Admitting: Family Medicine

## 2021-03-01 DIAGNOSIS — Z01419 Encounter for gynecological examination (general) (routine) without abnormal findings: Secondary | ICD-10-CM

## 2021-03-04 ENCOUNTER — Encounter: Payer: Self-pay | Admitting: Gastroenterology

## 2021-03-04 ENCOUNTER — Other Ambulatory Visit: Payer: Self-pay | Admitting: Gastroenterology

## 2021-03-04 DIAGNOSIS — K2970 Gastritis, unspecified, without bleeding: Secondary | ICD-10-CM

## 2021-03-08 ENCOUNTER — Encounter: Payer: Self-pay | Admitting: Gastroenterology

## 2021-03-08 ENCOUNTER — Other Ambulatory Visit: Payer: Self-pay

## 2021-03-14 ENCOUNTER — Telehealth: Payer: 59 | Admitting: Emergency Medicine

## 2021-03-14 ENCOUNTER — Other Ambulatory Visit: Payer: Self-pay

## 2021-03-14 ENCOUNTER — Telehealth (INDEPENDENT_AMBULATORY_CARE_PROVIDER_SITE_OTHER): Payer: Self-pay

## 2021-03-14 DIAGNOSIS — R300 Dysuria: Secondary | ICD-10-CM

## 2021-03-14 MED ORDER — CEPHALEXIN 500 MG PO CAPS
500.0000 mg | ORAL_CAPSULE | Freq: Two times a day (BID) | ORAL | 0 refills | Status: DC
  Filled 2021-03-14: qty 14, 7d supply, fill #0

## 2021-03-14 NOTE — Progress Notes (Signed)

## 2021-03-15 NOTE — Progress Notes (Signed)
Duplicate

## 2021-03-23 ENCOUNTER — Other Ambulatory Visit
Admission: RE | Admit: 2021-03-23 | Discharge: 2021-03-23 | Disposition: A | Discharge status: Home / Self Care | Payer: 59 | Attending: Endocrinology | Admitting: Endocrinology

## 2021-03-23 DIAGNOSIS — E049 Nontoxic goiter, unspecified: Secondary | ICD-10-CM | POA: Diagnosis not present

## 2021-03-23 DIAGNOSIS — R7301 Impaired fasting glucose: Secondary | ICD-10-CM | POA: Insufficient documentation

## 2021-03-23 LAB — BASIC METABOLIC PANEL
Anion gap: 6 (ref 5–15)
BUN: 11 mg/dL (ref 6–20)
CO2: 28 mmol/L (ref 22–32)
Calcium: 9.3 mg/dL (ref 8.9–10.3)
Chloride: 103 mmol/L (ref 98–111)
Creatinine, Ser: 0.54 mg/dL (ref 0.44–1.00)
GFR, Estimated: >60 mL/min (ref >60)
Glucose, Bld: 116 mg/dL — ABNORMAL HIGH (ref 70–99)
Potassium: 4.2 mmol/L (ref 3.5–5.1)
Sodium: 137 mmol/L (ref 135–145)

## 2021-03-23 LAB — LIPID PANEL
Cholesterol: 211 mg/dL — ABNORMAL HIGH (ref 0–200)
HDL: 59 mg/dL (ref >40)
LDL Cholesterol: 121 mg/dL — ABNORMAL HIGH (ref 0–99)
Total CHOL/HDL Ratio: 3.6 RATIO
Triglycerides: 154 mg/dL — ABNORMAL HIGH (ref <150)
VLDL: 31 mg/dL (ref 0–40)

## 2021-03-23 LAB — TSH: TSH: 0.889 u[IU]/mL (ref 0.350–4.500)

## 2021-03-24 DIAGNOSIS — M62830 Muscle spasm of back: Secondary | ICD-10-CM | POA: Diagnosis not present

## 2021-03-24 DIAGNOSIS — M9903 Segmental and somatic dysfunction of lumbar region: Secondary | ICD-10-CM | POA: Diagnosis not present

## 2021-03-24 DIAGNOSIS — M955 Acquired deformity of pelvis: Secondary | ICD-10-CM | POA: Diagnosis not present

## 2021-03-24 DIAGNOSIS — M9905 Segmental and somatic dysfunction of pelvic region: Secondary | ICD-10-CM | POA: Diagnosis not present

## 2021-03-24 LAB — HEMOGLOBIN A1C
Hgb A1c MFr Bld: 6 % — ABNORMAL HIGH (ref 4.8–5.6)
Mean Plasma Glucose: 126 mg/dL

## 2021-03-25 ENCOUNTER — Other Ambulatory Visit: Payer: Self-pay

## 2021-03-28 DIAGNOSIS — M9901 Segmental and somatic dysfunction of cervical region: Secondary | ICD-10-CM | POA: Diagnosis not present

## 2021-03-28 DIAGNOSIS — M5431 Sciatica, right side: Secondary | ICD-10-CM | POA: Diagnosis not present

## 2021-03-28 DIAGNOSIS — M9903 Segmental and somatic dysfunction of lumbar region: Secondary | ICD-10-CM | POA: Diagnosis not present

## 2021-03-28 DIAGNOSIS — M542 Cervicalgia: Secondary | ICD-10-CM | POA: Diagnosis not present

## 2021-03-29 ENCOUNTER — Encounter: Payer: Self-pay | Admitting: Family Medicine

## 2021-04-01 ENCOUNTER — Encounter: Payer: Self-pay | Admitting: Family Medicine

## 2021-04-01 ENCOUNTER — Other Ambulatory Visit: Payer: Self-pay

## 2021-04-01 ENCOUNTER — Ambulatory Visit: Payer: 59 | Admitting: Family Medicine

## 2021-04-01 VITALS — BP 134/85 | HR 76 | Temp 98.4°F | Wt 172.8 lb

## 2021-04-01 DIAGNOSIS — M5136 Other intervertebral disc degeneration, lumbar region: Secondary | ICD-10-CM

## 2021-04-01 DIAGNOSIS — M5030 Other cervical disc degeneration, unspecified cervical region: Secondary | ICD-10-CM | POA: Diagnosis not present

## 2021-04-01 NOTE — Patient Instructions (Signed)
MRI scheduled [address] [phone]

## 2021-04-01 NOTE — Progress Notes (Signed)
BP 134/85    Pulse 76    Temp 98.4 F (36.9 C)    Wt 172 lb 12.8 oz (78.4 kg)    SpO2 97%    BMI 30.61 kg/m    Subjective:    Patient ID: Brittany Ochoa, adult    DOB: 10/17/1965, 55 y.o.   MRN: [id number]  HPI: Brittany Ochoa is a 55 y.o. adult  Chief Complaint  Patient presents with   Neck Pain    Patient states she has been struggling with neck pain for years, has worsened in the past 2 weeks. Patient states she also has back pain, and would like imaging done due to meeting her deductible    NECK PAIN FOLLOW UP Status: uncontrolled Treatments attempted:  chiropractry, rest, ice, heat, APAP, ibuprofen, aleve, muscle relaxer, physical therapy, and HEP  Compliant with recommended treatment: yes Relief with NSAIDs?:  mild Location:generalized Duration:chronic Severity: moderate Quality: aching and sore Frequency: constant Radiation: yes Aggravating factors: lifting and movement Alleviating factors: chiropractry Weakness:  no Paresthesias / decreased sensation:  no  Fevers:  no  BACK PAIN- since June- had been carrying her dog, had been seeing chiropractor Duration:  years Mechanism of injury: lifting Location: bilateral and low back Onset: sudden Severity: moderate Quality: aching and shooting Frequency: intermittent Radiation: R leg above the knee and L leg above the knee Aggravating factors: lifting Alleviating factors: chiropractry Status: stable Treatments attempted:  chiropractry, rest, ice, heat, APAP, ibuprofen, aleve, physical therapy, and HEP  Relief with NSAIDs?: moderate Nighttime pain:  yes Paresthesias / decreased sensation:  no Bowel / bladder incontinence:  no Fevers:  no Dysuria / urinary frequency:  no  Relevant past medical, surgical, family and social history reviewed and updated as indicated. Interim medical history since our last visit reviewed. Allergies and medications reviewed and updated.  Review of Systems  Constitutional: Negative.    Respiratory: Negative.    Cardiovascular: Negative.   Musculoskeletal:  Positive for arthralgias, back pain, myalgias, neck pain and neck stiffness. Negative for gait problem and joint swelling.  Neurological: Negative.   Psychiatric/Behavioral: Negative.     Per HPI unless specifically indicated above     Objective:    BP 134/85    Pulse 76    Temp 98.4 F (36.9 C)    Wt 172 lb 12.8 oz (78.4 kg)    SpO2 97%    BMI 30.61 kg/m   Wt Readings from Last 3 Encounters:  04/01/21 172 lb 12.8 oz (78.4 kg)  02/17/21 158 lb (71.7 kg)  01/20/21 165 lb 9.6 oz (75.1 kg)    Physical Exam Vitals and nursing note reviewed.  Constitutional:      General: She is not in acute distress.    Appearance: Normal appearance. She is not ill-appearing, toxic-appearing or diaphoretic.  HENT:     Head: Normocephalic and atraumatic.     Right Ear: External ear normal.     Left Ear: External ear normal.     Nose: Nose normal.     Mouth/Throat:     Mouth: Mucous membranes are moist.     Pharynx: Oropharynx is clear.  Eyes:     General: No scleral icterus.       Right eye: No discharge.        Left eye: No discharge.     Extraocular Movements: Extraocular movements intact.     Conjunctiva/sclera: Conjunctivae normal.     Pupils: Pupils are equal, round, and reactive to  light.  Cardiovascular:     Rate and Rhythm: Normal rate and regular rhythm.     Pulses: Normal pulses.     Heart sounds: Normal heart sounds. No murmur heard.   No friction rub. No gallop.  Pulmonary:     Effort: Pulmonary effort is normal. No respiratory distress.     Breath sounds: Normal breath sounds. No stridor. No wheezing, rhonchi or rales.  Chest:     Chest wall: No tenderness.  Musculoskeletal:        General: Normal range of motion.     Cervical back: Normal range of motion and neck supple.  Skin:    General: Skin is warm and dry.     Capillary Refill: Capillary refill takes less than 2 seconds.     Coloration: Skin  is not jaundiced or pale.     Findings: No bruising, erythema, lesion or rash.  Neurological:     General: No focal deficit present.     Mental Status: She is alert and oriented to person, place, and time. Mental status is at baseline.  Psychiatric:        Mood and Affect: Mood normal.        Behavior: Behavior normal.        Thought Content: Thought content normal.        Judgment: Judgment normal.    Results for orders placed or performed during the hospital encounter of 40/37/54  Basic metabolic panel  Result Value Ref Range   Sodium 137 135 - 145 mmol/L   Potassium 4.2 3.5 - 5.1 mmol/L   Chloride 103 98 - 111 mmol/L   CO2 28 22 - 32 mmol/L   Glucose, Bld 116 (H) 70 - 99 mg/dL   BUN 11 6 - 20 mg/dL   Creatinine, Ser 0.54 0.44 - 1.00 mg/dL   Calcium 9.3 8.9 - 10.3 mg/dL   GFR, Estimated >60 >60 mL/min   Anion gap 6 5 - 15  Hemoglobin A1c  Result Value Ref Range   Hgb A1c MFr Bld 6.0 (H) 4.8 - 5.6 %   Mean Plasma Glucose 126 mg/dL  Lipid panel  Result Value Ref Range   Cholesterol 211 (H) 0 - 200 mg/dL   Triglycerides 154 (H) <150 mg/dL   HDL 59 >40 mg/dL   Total CHOL/HDL Ratio 3.6 RATIO   VLDL 31 0 - 40 mg/dL   LDL Cholesterol 121 (H) 0 - 99 mg/dL  TSH  Result Value Ref Range   TSH 0.889 0.350 - 4.500 uIU/mL      Assessment & Plan:   Problem List Items Addressed This Visit   None Visit Diagnoses     Other cervical disc degeneration, unspecified cervical region    -  Primary   Will get her scheduled for MRI. Await results. Treat as needed.    Relevant Orders   MR Cervical Spine Wo Contrast   Other intervertebral disc degeneration, lumbar region       Will get her scheduled for MRI. Await results. Treat as needed.    Relevant Orders   MR Lumbar Spine Wo Contrast        Follow up plan: Return in about 6 months (around 09/30/2021) for physical, records release from new GYN for pap to be done on 04/05/21.

## 2021-04-05 ENCOUNTER — Other Ambulatory Visit: Payer: Self-pay | Admitting: General Surgery

## 2021-04-05 DIAGNOSIS — R221 Localized swelling, mass and lump, neck: Secondary | ICD-10-CM

## 2021-04-05 DIAGNOSIS — Z01419 Encounter for gynecological examination (general) (routine) without abnormal findings: Secondary | ICD-10-CM | POA: Diagnosis not present

## 2021-04-05 DIAGNOSIS — R222 Localized swelling, mass and lump, trunk: Secondary | ICD-10-CM | POA: Diagnosis not present

## 2021-04-05 DIAGNOSIS — Z6830 Body mass index (BMI) 30.0-30.9, adult: Secondary | ICD-10-CM | POA: Diagnosis not present

## 2021-04-05 DIAGNOSIS — Z124 Encounter for screening for malignant neoplasm of cervix: Secondary | ICD-10-CM | POA: Diagnosis not present

## 2021-04-05 DIAGNOSIS — Z01411 Encounter for gynecological examination (general) (routine) with abnormal findings: Secondary | ICD-10-CM | POA: Diagnosis not present

## 2021-04-05 DIAGNOSIS — Z1231 Encounter for screening mammogram for malignant neoplasm of breast: Secondary | ICD-10-CM | POA: Diagnosis not present

## 2021-04-05 DIAGNOSIS — Z113 Encounter for screening for infections with a predominantly sexual mode of transmission: Secondary | ICD-10-CM | POA: Diagnosis not present

## 2021-04-05 LAB — HM PAP SMEAR

## 2021-04-06 ENCOUNTER — Ambulatory Visit: Payer: 59

## 2021-04-13 ENCOUNTER — Ambulatory Visit: Payer: 59 | Admitting: Gastroenterology

## 2021-04-13 ENCOUNTER — Other Ambulatory Visit: Payer: Self-pay

## 2021-04-13 ENCOUNTER — Ambulatory Visit (HOSPITAL_COMMUNITY)
Admission: RE | Admit: 2021-04-13 | Discharge: 2021-04-13 | Disposition: A | Discharge status: Home / Self Care | Payer: 59 | Source: Ambulatory Visit | Attending: Family Medicine | Admitting: Family Medicine

## 2021-04-13 DIAGNOSIS — M48061 Spinal stenosis, lumbar region without neurogenic claudication: Secondary | ICD-10-CM | POA: Diagnosis not present

## 2021-04-13 DIAGNOSIS — M47816 Spondylosis without myelopathy or radiculopathy, lumbar region: Secondary | ICD-10-CM | POA: Diagnosis not present

## 2021-04-13 DIAGNOSIS — M5030 Other cervical disc degeneration, unspecified cervical region: Secondary | ICD-10-CM | POA: Diagnosis not present

## 2021-04-13 DIAGNOSIS — M2578 Osteophyte, vertebrae: Secondary | ICD-10-CM | POA: Diagnosis not present

## 2021-04-13 DIAGNOSIS — M542 Cervicalgia: Secondary | ICD-10-CM | POA: Diagnosis not present

## 2021-04-13 DIAGNOSIS — M4807 Spinal stenosis, lumbosacral region: Secondary | ICD-10-CM | POA: Diagnosis not present

## 2021-04-13 DIAGNOSIS — M5136 Other intervertebral disc degeneration, lumbar region: Secondary | ICD-10-CM | POA: Diagnosis not present

## 2021-04-15 ENCOUNTER — Other Ambulatory Visit: Payer: Self-pay

## 2021-04-15 ENCOUNTER — Encounter (HOSPITAL_COMMUNITY): Payer: Self-pay

## 2021-04-15 ENCOUNTER — Ambulatory Visit (HOSPITAL_COMMUNITY)
Admission: RE | Admit: 2021-04-15 | Discharge: 2021-04-15 | Disposition: A | Discharge status: Home / Self Care | Payer: 59 | Source: Ambulatory Visit | Attending: General Surgery | Admitting: General Surgery

## 2021-04-15 DIAGNOSIS — R590 Localized enlarged lymph nodes: Secondary | ICD-10-CM | POA: Diagnosis not present

## 2021-04-15 DIAGNOSIS — M47812 Spondylosis without myelopathy or radiculopathy, cervical region: Secondary | ICD-10-CM | POA: Diagnosis not present

## 2021-04-15 DIAGNOSIS — E041 Nontoxic single thyroid nodule: Secondary | ICD-10-CM | POA: Diagnosis not present

## 2021-04-15 DIAGNOSIS — Z86018 Personal history of other benign neoplasm: Secondary | ICD-10-CM | POA: Diagnosis not present

## 2021-04-15 DIAGNOSIS — R221 Localized swelling, mass and lump, neck: Secondary | ICD-10-CM | POA: Diagnosis not present

## 2021-04-15 MED ORDER — IOHEXOL 350 MG/ML SOLN
60.0000 mL | Freq: Once | INTRAVENOUS | Status: AC | PRN
  Administered 2021-04-15: 15:00:00 60 mL via INTRAVENOUS

## 2021-04-15 MED ORDER — SODIUM CHLORIDE (PF) 0.9 % IJ SOLN
INTRAMUSCULAR | Status: AC
  Filled 2021-04-15: qty 50

## 2021-04-28 ENCOUNTER — Other Ambulatory Visit: Payer: Self-pay

## 2021-05-03 ENCOUNTER — Encounter: Payer: Self-pay | Admitting: Nurse Practitioner

## 2021-05-03 ENCOUNTER — Telehealth: Payer: 59 | Admitting: Physician Assistant

## 2021-05-03 ENCOUNTER — Other Ambulatory Visit: Payer: Self-pay

## 2021-05-03 ENCOUNTER — Ambulatory Visit: Payer: Self-pay

## 2021-05-03 ENCOUNTER — Ambulatory Visit: Payer: 59 | Admitting: Nurse Practitioner

## 2021-05-03 VITALS — BP 117/84 | HR 71 | Temp 98.5°F | Wt 181.8 lb

## 2021-05-03 DIAGNOSIS — N760 Acute vaginitis: Secondary | ICD-10-CM

## 2021-05-03 DIAGNOSIS — R8281 Pyuria: Secondary | ICD-10-CM | POA: Diagnosis not present

## 2021-05-03 DIAGNOSIS — R399 Unspecified symptoms and signs involving the genitourinary system: Secondary | ICD-10-CM | POA: Diagnosis not present

## 2021-05-03 DIAGNOSIS — B9689 Other specified bacterial agents as the cause of diseases classified elsewhere: Secondary | ICD-10-CM | POA: Diagnosis not present

## 2021-05-03 DIAGNOSIS — R3989 Other symptoms and signs involving the genitourinary system: Secondary | ICD-10-CM

## 2021-05-03 LAB — MICROSCOPIC EXAMINATION

## 2021-05-03 LAB — WET PREP FOR TRICH, YEAST, CLUE
Clue Cell Exam: POSITIVE — AB
Trichomonas Exam: NEGATIVE
Yeast Exam: NEGATIVE

## 2021-05-03 LAB — URINALYSIS, ROUTINE W REFLEX MICROSCOPIC: Specific Gravity, UA: <1.005 — ABNORMAL LOW (ref 1.005–1.030)

## 2021-05-03 MED ORDER — CEPHALEXIN 500 MG PO CAPS
500.0000 mg | ORAL_CAPSULE | Freq: Two times a day (BID) | ORAL | 0 refills | Status: AC
  Filled 2021-05-03: qty 14, 7d supply, fill #0

## 2021-05-03 MED ORDER — METRONIDAZOLE 500 MG PO TABS
500.0000 mg | ORAL_TABLET | Freq: Two times a day (BID) | ORAL | 0 refills | Status: AC
  Filled 2021-05-03: qty 14, 7d supply, fill #0

## 2021-05-03 NOTE — Progress Notes (Signed)

## 2021-05-03 NOTE — Patient Instructions (Signed)

## 2021-05-03 NOTE — Assessment & Plan Note (Signed)
Acute -- recurrent symptoms.  Wet prep + clue cells.  UA will send for culture as poor interpretation due to Azo.  Recommend she start Keflex prescribed.  Increase water intake at home and take a daily Vitamin D tablet to help acidify urine.  Return for worsening or ongoing symptoms.

## 2021-05-03 NOTE — Progress Notes (Signed)
BP 117/84    Pulse 71    Temp 98.5 F (36.9 C) (Oral)    Wt 181 lb 12.8 oz (82.5 kg)    SpO2 95%    BMI 32.20 kg/m    Subjective:    Patient ID: Brittany Ochoa, adult    DOB: Dec 25, 1965, 56 y.o.   MRN: [id number]  HPI: Brittany Ochoa is a 56 y.o. adult  Chief Complaint  Patient presents with   Back Pain   Urine Output    Patient states she is noticing cloudy urine and states she has been drinking plenty of fluids. Patient states at night she noticed she get chills really bad. Patient states she had some slight discomfort yesterday morning when she was urinating. Patient states this morning she was urinating this brown, mucus-y type type of urine. Patient states she is noticing lower right back pain. Patient states she first noticed it about 2-3 days ago (Saturday). Patient states she is noticing recurrent urinary tract    Urinary Tract Infection    Patient states she has noticed a bad odor with her urination. Patient states she had a UTI in September and then had another one in November.    URINARY SYMPTOMS Has had very mild symptoms since Saturday -- noticed more this morning.  Feeling more bloated and back pain.  She has had frequent urine infections recently.  She was prescribed Keflex on e-visit today. Dysuria: yes Urinary frequency: yes Urgency: yes Small volume voids: yes Symptom severity: no Urinary incontinence: yes Foul odor: yes Hematuria: no Abdominal pain: no Back pain: yes Suprapubic pain/pressure: yes Flank pain: yes Fever:  no Vomiting: no Relief with pyridium: yes Status: stable Previous urinary tract infection: yes Recurrent urinary tract infection: yes Sexual activity: No sexually active History of sexually transmitted disease: no Treatments attempted: pyridium and increasing fluids    Relevant past medical, surgical, family and social history reviewed and updated as indicated. Interim medical history since our last visit reviewed. Allergies and  medications reviewed and updated.  Review of Systems  Constitutional:  Negative for activity change, appetite change, diaphoresis, fatigue and fever.  Respiratory:  Negative for cough, chest tightness and shortness of breath.   Cardiovascular:  Negative for chest pain, palpitations and leg swelling.  Gastrointestinal:  Positive for abdominal distention. Negative for abdominal pain, constipation, diarrhea, nausea and vomiting.  Genitourinary:  Positive for decreased urine volume, dysuria, frequency, urgency and vaginal discharge. Negative for hematuria.  Musculoskeletal:  Positive for back pain.  Neurological: Negative.   Psychiatric/Behavioral: Negative.     Per HPI unless specifically indicated above    Objective:    BP 117/84    Pulse 71    Temp 98.5 F (36.9 C) (Oral)    Wt 181 lb 12.8 oz (82.5 kg)    SpO2 95%    BMI 32.20 kg/m   Wt Readings from Last 3 Encounters:  05/03/21 181 lb 12.8 oz (82.5 kg)  04/01/21 172 lb 12.8 oz (78.4 kg)  02/17/21 158 lb (71.7 kg)    Physical Exam Vitals and nursing note reviewed.  Constitutional:      General: She is awake. She is not in acute distress.    Appearance: She is well-developed and well-groomed. She is obese. She is not ill-appearing or toxic-appearing.  HENT:     Head: Normocephalic.     Right Ear: Hearing normal.     Left Ear: Hearing normal.  Eyes:     General: Lids are  normal.        Right eye: No discharge.        Left eye: No discharge.     Conjunctiva/sclera: Conjunctivae normal.     Pupils: Pupils are equal, round, and reactive to light.  Neck:     Thyroid: No thyromegaly.     Vascular: No carotid bruit.  Cardiovascular:     Rate and Rhythm: Normal rate and regular rhythm.     Heart sounds: Normal heart sounds. No murmur heard.   No gallop.  Pulmonary:     Effort: Pulmonary effort is normal. No accessory muscle usage or respiratory distress.     Breath sounds: Normal breath sounds.  Abdominal:     General: Bowel  sounds are normal.     Palpations: Abdomen is soft. There is no hepatomegaly or splenomegaly.  Musculoskeletal:     Cervical back: Normal range of motion and neck supple.     Right lower leg: No edema.     Left lower leg: No edema.  Skin:    General: Skin is warm and dry.  Neurological:     Mental Status: She is alert and oriented to person, place, and time.  Psychiatric:        Attention and Perception: Attention normal.        Mood and Affect: Mood normal.        Speech: Speech normal.        Behavior: Behavior normal. Behavior is cooperative.        Thought Content: Thought content normal.    Results for orders placed or performed during the hospital encounter of 75/44/92  Basic metabolic panel  Result Value Ref Range   Sodium 137 135 - 145 mmol/L   Potassium 4.2 3.5 - 5.1 mmol/L   Chloride 103 98 - 111 mmol/L   CO2 28 22 - 32 mmol/L   Glucose, Bld 116 (H) 70 - 99 mg/dL   BUN 11 6 - 20 mg/dL   Creatinine, Ser 0.54 0.44 - 1.00 mg/dL   Calcium 9.3 8.9 - 10.3 mg/dL   GFR, Estimated >60 >60 mL/min   Anion gap 6 5 - 15  Hemoglobin A1c  Result Value Ref Range   Hgb A1c MFr Bld 6.0 (H) 4.8 - 5.6 %   Mean Plasma Glucose 126 mg/dL  Lipid panel  Result Value Ref Range   Cholesterol 211 (H) 0 - 200 mg/dL   Triglycerides 154 (H) <150 mg/dL   HDL 59 >40 mg/dL   Total CHOL/HDL Ratio 3.6 RATIO   VLDL 31 0 - 40 mg/dL   LDL Cholesterol 121 (H) 0 - 99 mg/dL  TSH  Result Value Ref Range   TSH 0.889 0.350 - 4.500 uIU/mL      Assessment & Plan:   Problem List Items Addressed This Visit       Genitourinary   Bacterial vaginosis    Acute with + clue cells on wet prep.  Educated patient on this finding and treatment.  Flagyl script sent in and educated on use of this.  Recommend Replens at home for vaginal health.      Relevant Medications   metroNIDAZOLE (FLAGYL) 500 MG tablet     Other   Urinary symptom or sign - Primary    Acute -- recurrent symptoms.  Wet prep + clue  cells.  UA will send for culture as poor interpretation due to Azo.  Recommend she start Keflex prescribed.  Increase water intake at home and  take a daily Vitamin D tablet to help acidify urine.  Return for worsening or ongoing symptoms.      Relevant Orders   WET PREP FOR TRICH, YEAST, CLUE   Urinalysis, Routine w reflex microscopic   Other Visit Diagnoses     Pyuria       Urine for culture.   Relevant Orders   Urine Culture        Follow up plan: Return if symptoms worsen or fail to improve.

## 2021-05-03 NOTE — Assessment & Plan Note (Signed)
Acute with + clue cells on wet prep.  Educated patient on this finding and treatment.  Flagyl script sent in and educated on use of this.  Recommend Replens at home for vaginal health.

## 2021-05-03 NOTE — Telephone Encounter (Signed)
°  Chief Complaint: back and pain with urination Symptoms: pain with urination, chills, flank pain Frequency: with void Pertinent Negatives: Patient denies abdominal pain blood in urine current fever Disposition: [] ED /[x] Urgent Care (no appt availability in office) / [] Appointment(In office/virtual)/ []  [facility]/ [] Home Care/ [] Refused Recommended Disposition /[] [facility]/ []  Follow-up with PCP Additional Notes: called [facility] was going to try and double book but pt could not go at the times available        Reason for Disposition  Side (flank) or lower back pain present  Answer Assessment - Initial Assessment Questions 1. SYMPTOM: "What's the main symptom you're concerned about?" (e.g., frequency, incontinence)     Pain with urination and lower back pain 2. ONSET: "When did the  pain  start?"     2 days ago 3. PAIN: "Is there any pain?" If Yes, ask: "How bad is it?" (Scale: 1-10; mild, moderate, severe)     Mod to severe 4. CAUSE: "What do you think is causing the symptoms?"     UTI 5. OTHER SYMPTOMS: "Do you have any other symptoms?" (e.g., fever, flank pain, blood in urine, pain with urination)     Flank pain, mucous,pain 6. PREGNANCY: "Is there any chance you are pregnant?" "When was your last menstrual period?"     *No Answer*  Protocols used: Urinary Symptoms-A-AH

## 2021-05-03 NOTE — Progress Notes (Signed)
I have spent 5 minutes in review of e-visit questionnaire, review and updating patient chart, medical decision making and response to patient.   Zaydn Gutridge Cody Brendalee Matthies, PA-C    

## 2021-05-12 ENCOUNTER — Encounter: Payer: Self-pay | Admitting: Family Medicine

## 2021-05-12 ENCOUNTER — Ambulatory Visit: Payer: 59 | Admitting: Family Medicine

## 2021-05-12 ENCOUNTER — Other Ambulatory Visit: Payer: Self-pay

## 2021-05-12 VITALS — BP 116/79 | HR 73 | Temp 97.8°F | Wt 178.2 lb

## 2021-05-12 DIAGNOSIS — N390 Urinary tract infection, site not specified: Secondary | ICD-10-CM | POA: Diagnosis not present

## 2021-05-12 DIAGNOSIS — R8290 Unspecified abnormal findings in urine: Secondary | ICD-10-CM

## 2021-05-12 LAB — URINALYSIS, ROUTINE W REFLEX MICROSCOPIC
Bilirubin, UA: NEGATIVE
Glucose, UA: NEGATIVE
Ketones, UA: NEGATIVE
Leukocytes,UA: NEGATIVE
Nitrite, UA: NEGATIVE
Protein,UA: NEGATIVE
RBC, UA: NEGATIVE
Specific Gravity, UA: 1.025 (ref 1.005–1.030)
Urobilinogen, Ur: 0.2 mg/dL (ref 0.2–1.0)
pH, UA: 5.5 (ref 5.0–7.5)

## 2021-05-12 NOTE — Progress Notes (Signed)
BP 116/79    Pulse 73    Temp 97.8 F (36.6 C)    Wt 178 lb 3.2 oz (80.8 kg)    SpO2 98%    BMI 31.57 kg/m    Subjective:    Patient ID: Brittany Ochoa, adult    DOB: May 25, 1965, 56 y.o.   MRN: [id number]  HPI: DIANN BANGERTER is a 56 y.o. adult  Chief Complaint  Patient presents with   cloudy urine    Patient is having concerns about her urine. States her urine now is golden yellow, she is concerned she is not using the bathroom as much even though she is staying hydrated. Patient states her urine smells like metal. Patient states after she urinates she feels pressure in her vaginal area.   URINARY SYMPTOMS Duration: on and off for about 6 months Dysuria: no Urinary frequency: no Urgency: yes Small volume voids: no Symptom severity: moderate Urinary incontinence: yes Foul odor: yes metallic smelling Hematuria: unsure Abdominal pain: no Back pain: no Suprapubic pain/pressure: no Flank pain: no Fever:  no Vomiting: no Relief with cranberry juice: no Relief with pyridium: no Status: better Previous urinary tract infection: yes Recurrent urinary tract infection: yes Vaginal discharge: no Treatments attempted: antibiotics and increasing fluids   Relevant past medical, surgical, family and social history reviewed and updated as indicated. Interim medical history since our last visit reviewed. Allergies and medications reviewed and updated.  Review of Systems  Constitutional: Negative.   Respiratory: Negative.    Cardiovascular: Negative.   Gastrointestinal: Negative.   Genitourinary: Negative.   Psychiatric/Behavioral: Negative.     Per HPI unless specifically indicated above     Objective:    BP 116/79    Pulse 73    Temp 97.8 F (36.6 C)    Wt 178 lb 3.2 oz (80.8 kg)    SpO2 98%    BMI 31.57 kg/m   Wt Readings from Last 3 Encounters:  05/12/21 178 lb 3.2 oz (80.8 kg)  05/03/21 181 lb 12.8 oz (82.5 kg)  04/01/21 172 lb 12.8 oz (78.4 kg)    Physical  Exam Vitals and nursing note reviewed.  Constitutional:      General: She is not in acute distress.    Appearance: Normal appearance. She is not ill-appearing, toxic-appearing or diaphoretic.  HENT:     Head: Normocephalic and atraumatic.     Right Ear: External ear normal.     Left Ear: External ear normal.     Nose: Nose normal.     Mouth/Throat:     Mouth: Mucous membranes are moist.     Pharynx: Oropharynx is clear.  Eyes:     General: No scleral icterus.       Right eye: No discharge.        Left eye: No discharge.     Extraocular Movements: Extraocular movements intact.     Conjunctiva/sclera: Conjunctivae normal.     Pupils: Pupils are equal, round, and reactive to light.  Cardiovascular:     Rate and Rhythm: Normal rate and regular rhythm.     Pulses: Normal pulses.     Heart sounds: Normal heart sounds. No murmur heard.   No friction rub. No gallop.  Pulmonary:     Effort: Pulmonary effort is normal. No respiratory distress.     Breath sounds: Normal breath sounds. No stridor. No wheezing, rhonchi or rales.  Chest:     Chest wall: No tenderness.  Musculoskeletal:  General: Normal range of motion.     Cervical back: Normal range of motion and neck supple.  Skin:    General: Skin is warm and dry.     Capillary Refill: Capillary refill takes less than 2 seconds.     Coloration: Skin is not jaundiced or pale.     Findings: No bruising, erythema, lesion or rash.  Neurological:     General: No focal deficit present.     Mental Status: She is alert and oriented to person, place, and time. Mental status is at baseline.  Psychiatric:        Mood and Affect: Mood normal.        Behavior: Behavior normal.        Thought Content: Thought content normal.        Judgment: Judgment normal.    Results for orders placed or performed in visit on 05/12/21  Urinalysis, Routine w reflex microscopic  Result Value Ref Range   Specific Gravity, UA 1.025 1.005 - 1.030   pH,  UA 5.5 5.0 - 7.5   Color, UA Yellow Yellow   Appearance Ur Clear Clear   Leukocytes,UA Negative Negative   Protein,UA Negative Negative/Trace   Glucose, UA Negative Negative   Ketones, UA Negative Negative   RBC, UA Negative Negative   Bilirubin, UA Negative Negative   Urobilinogen, Ur 0.2 0.2 - 1.0 mg/dL   Nitrite, UA Negative Negative      Assessment & Plan:   Problem List Items Addressed This Visit   None Visit Diagnoses     Cloudy urine    -  Primary   UA clear today. Continue to monitor. Call with any concerns.    Relevant Orders   Urinalysis, Routine w reflex microscopic (Completed)   Recurrent UTI       Will get her into urology due to her concern about bladder cancer and recurrent UTIs. Call with any concerns.    Relevant Orders   Ambulatory referral to Urology        Follow up plan: Return if symptoms worsen or fail to improve.

## 2021-05-17 ENCOUNTER — Encounter: Payer: Self-pay | Admitting: *Deleted

## 2021-05-17 ENCOUNTER — Encounter: Payer: 59 | Admitting: Obstetrics and Gynecology

## 2021-05-31 DIAGNOSIS — Z923 Personal history of irradiation: Secondary | ICD-10-CM | POA: Diagnosis not present

## 2021-05-31 DIAGNOSIS — R002 Palpitations: Secondary | ICD-10-CM | POA: Diagnosis not present

## 2021-05-31 DIAGNOSIS — R7301 Impaired fasting glucose: Secondary | ICD-10-CM | POA: Diagnosis not present

## 2021-05-31 DIAGNOSIS — R635 Abnormal weight gain: Secondary | ICD-10-CM | POA: Diagnosis not present

## 2021-05-31 DIAGNOSIS — E785 Hyperlipidemia, unspecified: Secondary | ICD-10-CM | POA: Diagnosis not present

## 2021-05-31 DIAGNOSIS — E049 Nontoxic goiter, unspecified: Secondary | ICD-10-CM | POA: Diagnosis not present

## 2021-05-31 DIAGNOSIS — R000 Tachycardia, unspecified: Secondary | ICD-10-CM | POA: Diagnosis not present

## 2021-05-31 DIAGNOSIS — I10 Essential (primary) hypertension: Secondary | ICD-10-CM | POA: Diagnosis not present

## 2021-06-01 ENCOUNTER — Encounter: Payer: Self-pay | Admitting: Family Medicine

## 2021-06-03 NOTE — Telephone Encounter (Signed)
appt

## 2021-06-06 NOTE — Telephone Encounter (Signed)
Lmom asking pt to call back to schedule an appt. °

## 2021-06-08 ENCOUNTER — Other Ambulatory Visit: Payer: Self-pay

## 2021-06-10 ENCOUNTER — Encounter: Payer: Self-pay | Admitting: Family Medicine

## 2021-06-10 ENCOUNTER — Other Ambulatory Visit: Payer: Self-pay

## 2021-06-10 ENCOUNTER — Ambulatory Visit: Payer: 59 | Admitting: Family Medicine

## 2021-06-10 VITALS — BP 121/85 | HR 81 | Temp 98.1°F | Wt 185.4 lb

## 2021-06-10 DIAGNOSIS — R7301 Impaired fasting glucose: Secondary | ICD-10-CM | POA: Diagnosis not present

## 2021-06-10 LAB — BAYER DCA HB A1C WAIVED: HB A1C (BAYER DCA - WAIVED): 5.7 % — ABNORMAL HIGH (ref 4.8–5.6)

## 2021-06-10 NOTE — Progress Notes (Signed)
BP 121/85    Pulse 81    Temp 98.1 F (36.7 C)    Wt 185 lb 6.4 oz (84.1 kg)    LMP 06/01/2019    SpO2 97%    BMI 32.84 kg/m    Subjective:    Patient ID: Brittany Ochoa, adult    DOB: [birth year], 56 y.o.   MRN: [id number]  HPI: Brittany Ochoa is a 56 y.o. adult  Chief Complaint  Patient presents with   Pre-Diabetic    Patient is concerned her A1C has gone up even with metformin, patient has also gained weight. Would like to try [facility].    Impaired Fasting Glucose HbA1C:  Lab Results  Component Value Date   HGBA1C 6.0 (H) 03/23/2021   Duration of elevated blood sugar: chronic Polydipsia: yes Polyuria: yes Weight change: yes Visual disturbance: no Glucose Monitoring: no Diabetic Education: Not Completed Family history of diabetes: yes   Relevant past medical, surgical, family and social history reviewed and updated as indicated. Interim medical history since our last visit reviewed. Allergies and medications reviewed and updated.  Review of Systems  Constitutional: Negative.   Respiratory: Negative.    Cardiovascular: Negative.   Gastrointestinal: Negative.   Neurological: Negative.   Psychiatric/Behavioral: Negative.     Per HPI unless specifically indicated above     Objective:    BP 121/85    Pulse 81    Temp 98.1 F (36.7 C)    Wt 185 lb 6.4 oz (84.1 kg)    LMP 06/01/2019    SpO2 97%    BMI 32.84 kg/m   Wt Readings from Last 3 Encounters:  06/10/21 185 lb 6.4 oz (84.1 kg)  05/12/21 178 lb 3.2 oz (80.8 kg)  05/03/21 181 lb 12.8 oz (82.5 kg)    Physical Exam Vitals and nursing note reviewed.  Constitutional:      General: She is not in acute distress.    Appearance: Normal appearance. She is not ill-appearing, toxic-appearing or diaphoretic.  HENT:     Head: Normocephalic and atraumatic.     Right Ear: External ear normal.     Left Ear: External ear normal.     Nose: Nose normal.     Mouth/Throat:     Mouth: Mucous membranes are moist.      Pharynx: Oropharynx is clear.  Eyes:     General: No scleral icterus.       Right eye: No discharge.        Left eye: No discharge.     Extraocular Movements: Extraocular movements intact.     Conjunctiva/sclera: Conjunctivae normal.     Pupils: Pupils are equal, round, and reactive to light.  Cardiovascular:     Rate and Rhythm: Normal rate and regular rhythm.     Pulses: Normal pulses.     Heart sounds: Normal heart sounds. No murmur heard.   No friction rub. No gallop.  Pulmonary:     Effort: Pulmonary effort is normal. No respiratory distress.     Breath sounds: Normal breath sounds. No stridor. No wheezing, rhonchi or rales.  Chest:     Chest wall: No tenderness.  Musculoskeletal:        General: Normal range of motion.     Cervical back: Normal range of motion and neck supple.  Skin:    General: Skin is warm and dry.     Capillary Refill: Capillary refill takes less than 2 seconds.     Coloration: Skin  is not jaundiced or pale.     Findings: No bruising, erythema, lesion or rash.  Neurological:     General: No focal deficit present.     Mental Status: She is alert and oriented to person, place, and time. Mental status is at baseline.  Psychiatric:        Mood and Affect: Mood normal.        Behavior: Behavior normal.        Thought Content: Thought content normal.        Judgment: Judgment normal.    Results for orders placed or performed in visit on 05/12/21  Urinalysis, Routine w reflex microscopic  Result Value Ref Range   Specific Gravity, UA 1.025 1.005 - 1.030   pH, UA 5.5 5.0 - 7.5   Color, UA Yellow Yellow   Appearance Ur Clear Clear   Leukocytes,UA Negative Negative   Protein,UA Negative Negative/Trace   Glucose, UA Negative Negative   Ketones, UA Negative Negative   RBC, UA Negative Negative   Bilirubin, UA Negative Negative   Urobilinogen, Ur 0.2 0.2 - 1.0 mg/dL   Nitrite, UA Negative Negative      Assessment & Plan:   Problem List Items  Addressed This Visit       Endocrine   IFG (impaired fasting glucose) - Primary    Doing well with A1c of 5.7. Will keep on metformin and continue to monitor. Would like to see new endocrinologist. Referral generated today.      Relevant Orders   [facility] [place] Hb A1c Waived   Ambulatory referral to Endocrinology     Follow up plan: Return June Physical.

## 2021-06-10 NOTE — Assessment & Plan Note (Signed)
Doing well with A1c of 5.7. Will keep on metformin and continue to monitor. Would like to see new endocrinologist. Referral generated today.

## 2021-06-13 ENCOUNTER — Other Ambulatory Visit: Payer: Self-pay

## 2021-06-13 MED ORDER — CARESTART COVID-19 HOME TEST VI KIT
PACK | 0 refills | Status: DC
  Filled 2021-06-13: qty 2, 4d supply, fill #0

## 2021-07-01 IMAGING — CR DG CHEST 2V
2 series · 2 of 2 positions shown · non-contrast
Comparison: 04/20/2019

CLINICAL DATA: [id number], lingering cough and congestion question
pneumonia

EXAM:
CHEST - 2 VIEW

[chest pa]
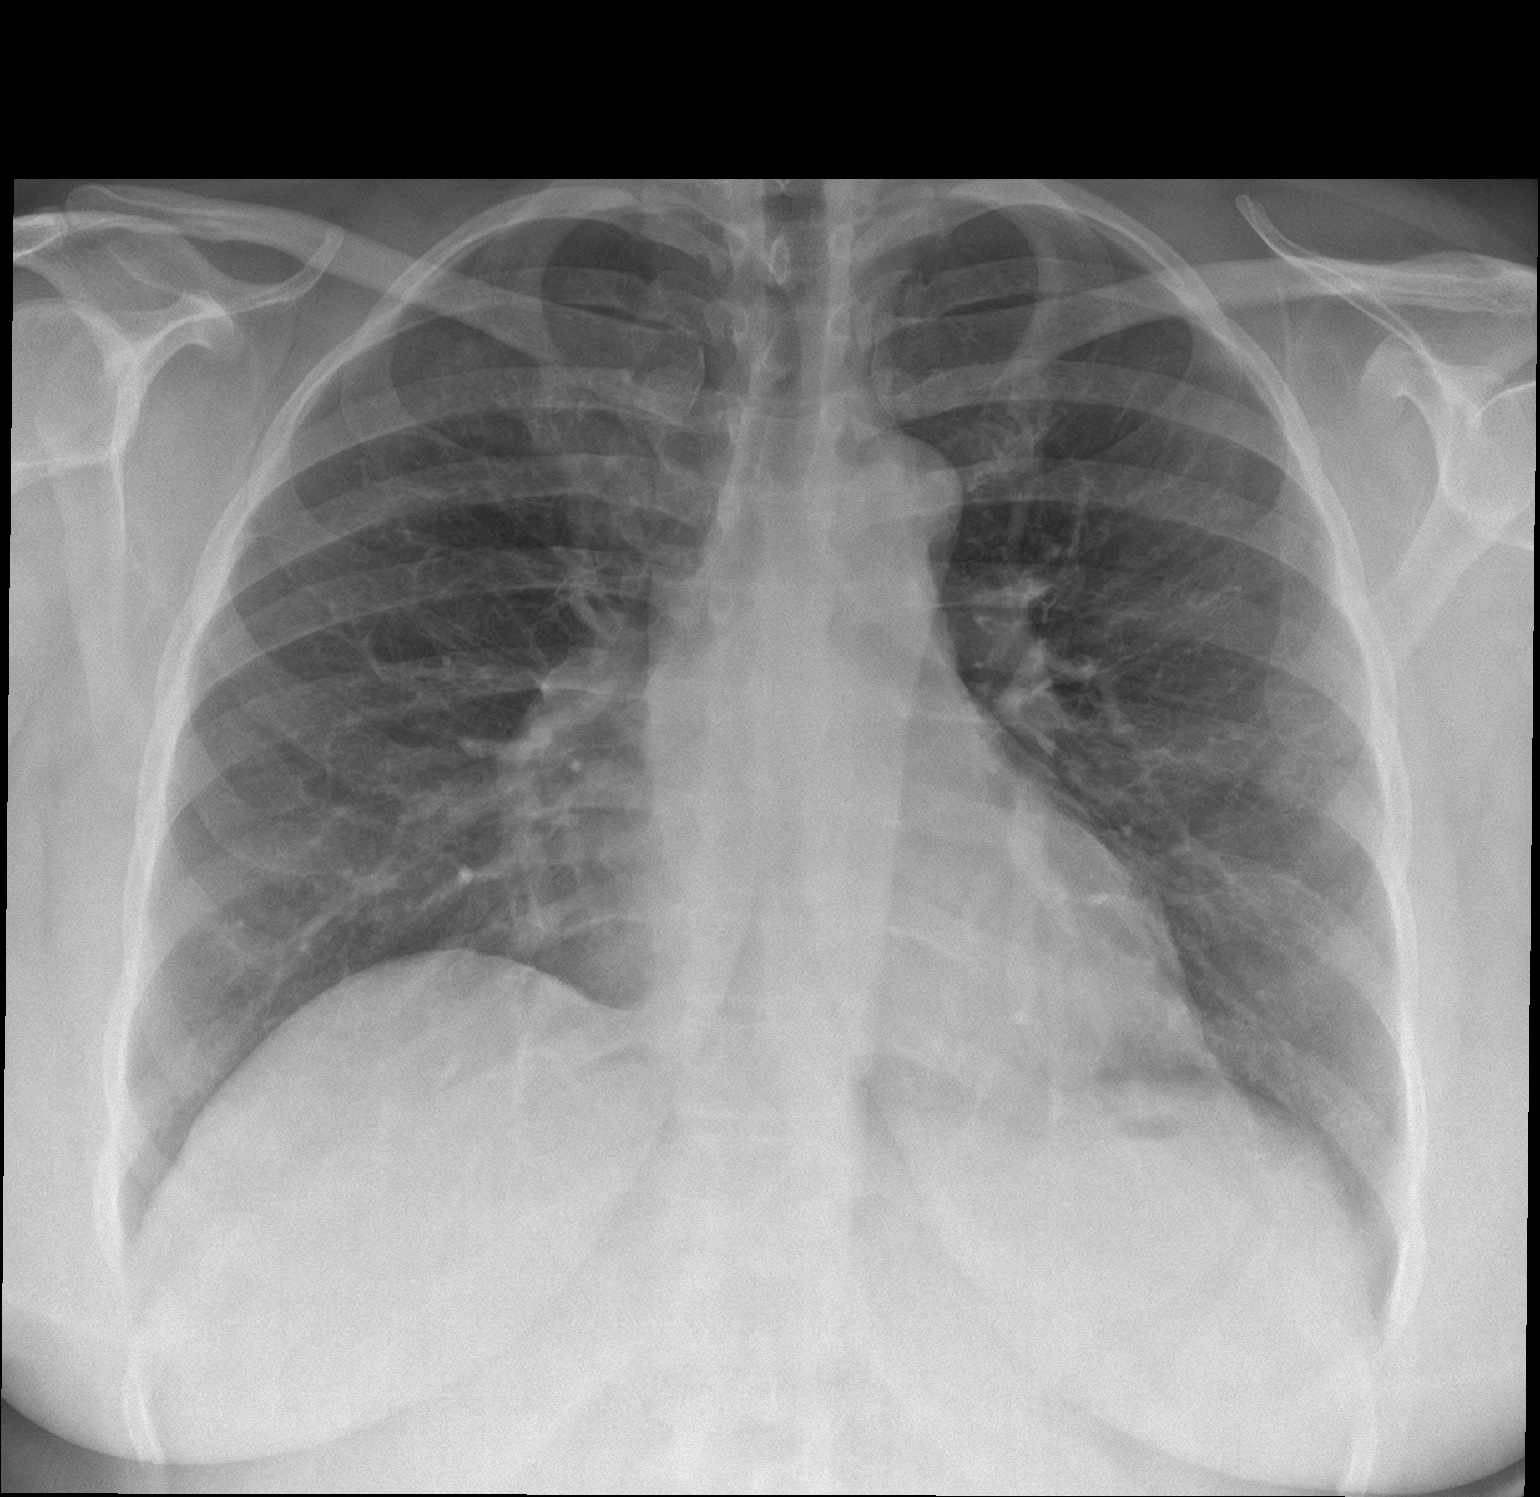

[chest lat]
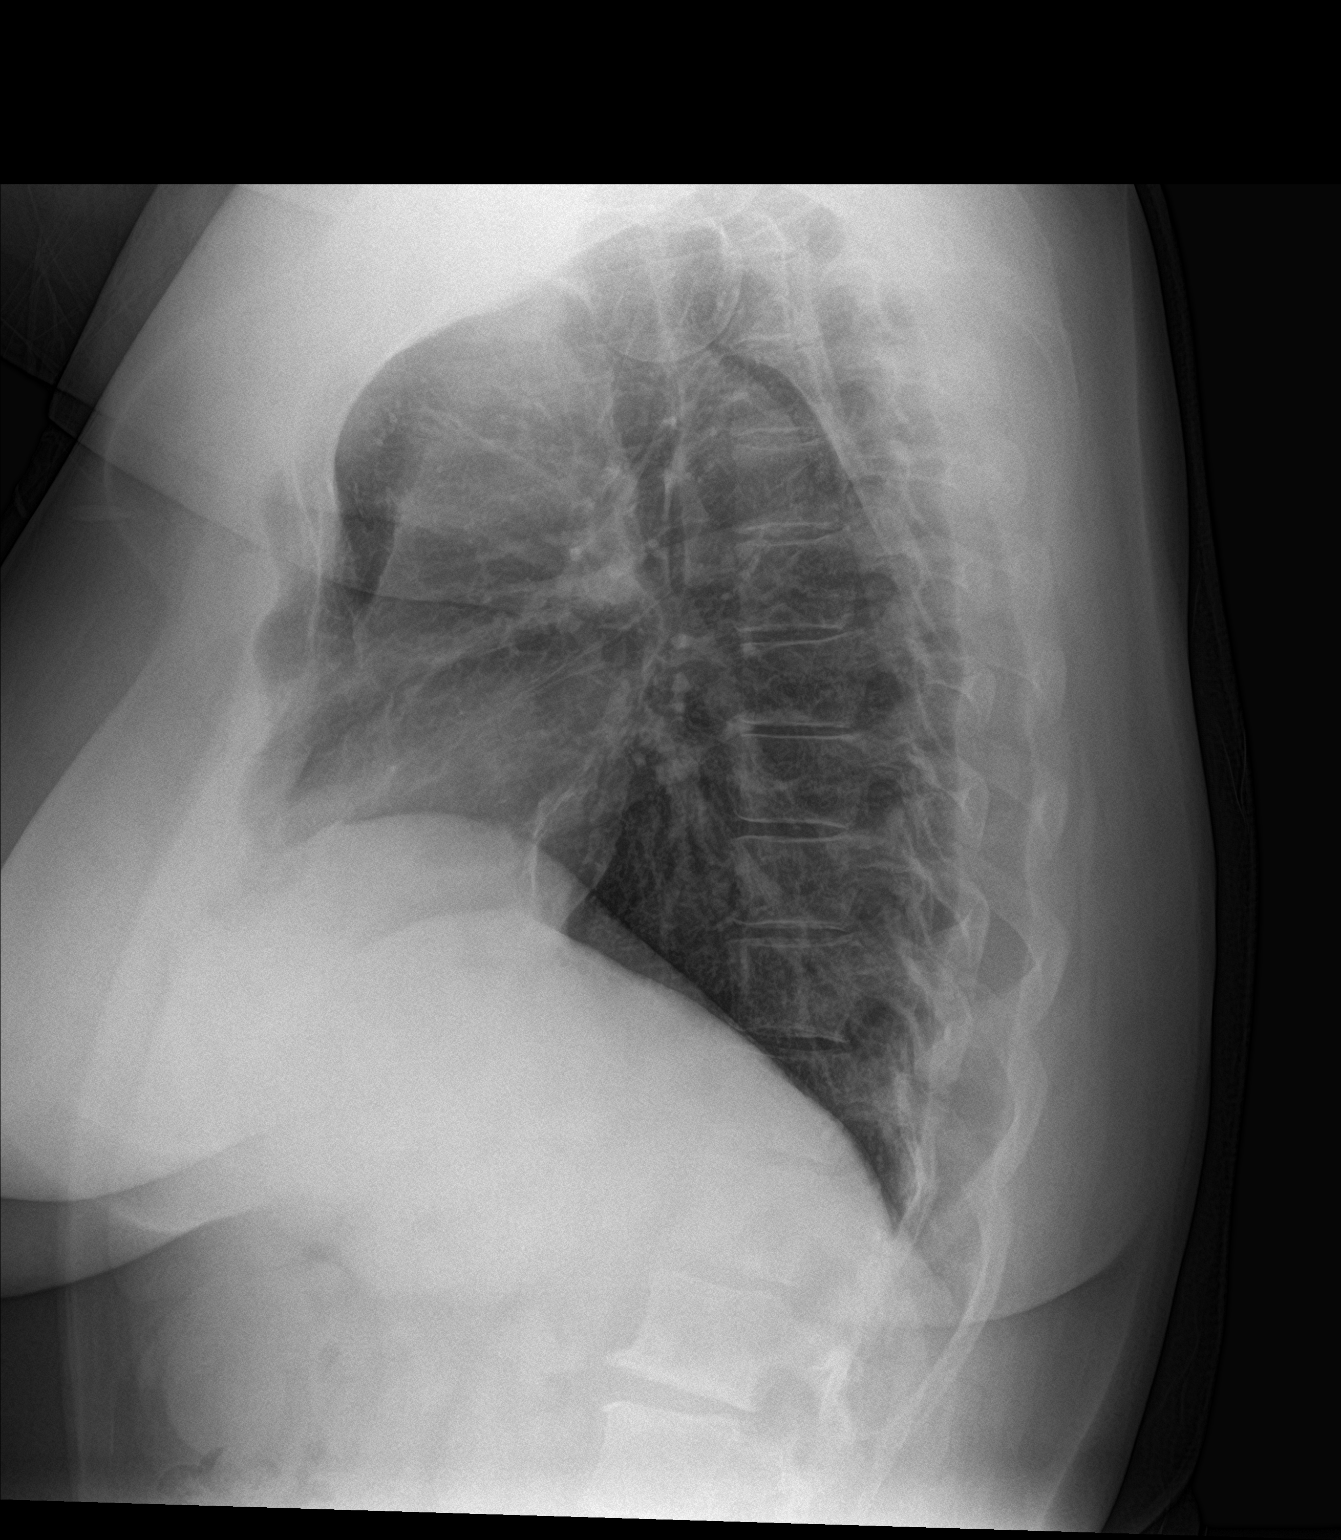

[2 of 2 positions shown; findings below may reference images not displayed]

FINDINGS: Normal heart size, mediastinal contours, and pulmonary vascularity.

Lungs clear.

No pulmonary infiltrate, pleural effusion or pneumothorax.

Pectus carinatum.

No acute osseous findings.
IMPRESSION: No acute abnormalities.

## 2021-07-06 ENCOUNTER — Other Ambulatory Visit: Payer: Self-pay

## 2021-07-14 ENCOUNTER — Telehealth: Payer: Self-pay | Admitting: Cardiovascular Disease

## 2021-07-14 NOTE — Telephone Encounter (Signed)
Patient c/o Palpitations:  High priority if patient c/o lightheadedness, shortness of breath, or chest pain ? ?How long have you had palpitations/irregular HR/ Afib? Are you having the symptoms now? About a month - comes and goes  ? ?Are you currently experiencing lightheadedness, SOB or CP? No, not currently  ? ?Do you have a history of afib (atrial fibrillation) or irregular heart rhythm? Yes  ? ?Have you checked your BP or HR? (document readings if available): all normal - no readings at this time  ? ?Are you experiencing any other symptoms? Sometimes SOB ? ?Patient requesting appt.  No appointment available til ~ May.  Would not schedule, wants sooner. ?

## 2021-07-15 ENCOUNTER — Ambulatory Visit (INDEPENDENT_AMBULATORY_CARE_PROVIDER_SITE_OTHER): Payer: 59 | Admitting: Cardiovascular Disease

## 2021-07-15 ENCOUNTER — Encounter: Payer: Self-pay | Admitting: Cardiovascular Disease

## 2021-07-15 VITALS — BP 138/60 | HR 81 | Ht 63.0 in | Wt 178.5 lb

## 2021-07-15 DIAGNOSIS — I7121 Aneurysm of the ascending aorta, without rupture: Secondary | ICD-10-CM

## 2021-07-15 DIAGNOSIS — R002 Palpitations: Secondary | ICD-10-CM

## 2021-07-15 DIAGNOSIS — I10 Essential (primary) hypertension: Secondary | ICD-10-CM

## 2021-07-15 NOTE — Telephone Encounter (Signed)
Left msg to let patient know of open appt with Dr. Fletcher Anon today at 4 (DOD slot)

## 2021-07-15 NOTE — Progress Notes (Signed)
?  ?Cardiology Office Note ? ? ?Date:  07/15/2021  ? ?ID:  Brittany Ochoa, DOB 1965/10/12, MRN [id number] ? ?PCP:  [name], DO  ?Cardiologist:   Kathlyn Sacramento, MD  ? ?Chief Complaint  ?Patient presents with  ? Other  ?  12 month f/u c/o heart palpitations/pounding/skipping heart beat. Pt mentioned when juice fasting irregular HR stopped then started back up when she started eating again. Meds reviewed verbally with pt.  ? ? ?  ?History of Present Illness: ?Brittany Ochoa is a 56 y.o. adult who is here today for follow-up visit regarding palpitations and essential hypertension.  ?She is a previous smoker but quit in 2006.  She is known to have small ascending aortic aneurysm measuring 4.1 x 4 cm.  She sees Dr. Cyndia Bent on a yearly basis.  ?She had previous palpitation.  ZIO monitor in September 2020 showed normal sinus rhythm with isolated PACs.  Some of her events correlated with PACs but most of the events did not correlate with arrhythmia.  She had COVID-19 infection in December 2020.  She had chest pain in 2021 and thus underwent cardiac CTA which showed normal coronary arteries with calcium score of 0. ? ?She had elevated blood pressure with negative work-up for secondary hypertension. ? ?She reports recent palpitations described as skipping in her heart which takes her breath.  No chest pain.  No shortness of breath, dizziness or syncope.  Overall, she improved her lifestyle over the last year and has been able to lose weight. ? ?Past Medical History:  ?Diagnosis Date  ? Allergy 2015  ? seasonal  ? Ascending aortic aneurysm   ? a. 06/2018 CTA Chest: 4.1 x 4.0 cm fusiform Asc Ao aneurysm-unchanged.  Descending thoracic aorta 2.1 cm.  ? Asthma   ? exercised induced  ? Dyspnea on exertion   ? a. 06/2017 echo: EF 60 to 65%.  No regional wall motion abnormalities.  Ascending aortic diameter 3.9 cm.  Mild MR.  ? Essential hypertension   ? Multiple thyroid nodules   ? OCD (obsessive compulsive disorder)   ?  Reflux esophagitis   ? ? ?Past Surgical History:  ?Procedure Laterality Date  ? BREAST REDUCTION SURGERY  1998  ? [place]  ? breast reduction  ? ESOPHAGOGASTRODUODENOSCOPY (EGD) WITH PROPOFOL N/A 01/24/2018  ? Procedure: ESOPHAGOGASTRODUODENOSCOPY (EGD) WITH PROPOFOL;  Surgeon: [name], MD;  Location: [facility] ENDOSCOPY;  Service: Endoscopy;  Laterality: N/A;  ? ESOPHAGOGASTRODUODENOSCOPY (EGD) WITH PROPOFOL N/A 09/09/2020  ? Procedure: ESOPHAGOGASTRODUODENOSCOPY (EGD) WITH PROPOFOL;  Surgeon: [name], MD;  Location: [facility] ENDOSCOPY;  Service: Endoscopy;  Laterality: N/A;  ? ESOPHAGOGASTRODUODENOSCOPY (EGD) WITH PROPOFOL N/A 02/17/2021  ? Procedure: ESOPHAGOGASTRODUODENOSCOPY (EGD) WITH PROPOFOL;  Surgeon: [name], MD;  Location: [facility] ENDOSCOPY;  Service: Endoscopy;  Laterality: N/A;  ? lipoma removal  2007  ? back  ? REDUCTION MAMMAPLASTY Bilateral 12/01/1996  ? ? ? ?Current Outpatient Medications  ?Medication Sig Dispense Refill  ? COVID-19 At Home Antigen Test [facility] COVID-19 HOME TEST) KIT Use as directed 2 kit 0  ? metFORMIN (GLUCOPHAGE-XR) 500 MG 24 hr tablet TAKE 1 TABLET BY MOUTH TWICE DAILY 60 tablet 6  ? ?No current facility-administered medications for this visit.  ? ? ?Allergies:   Codeine, Medical provider ez flu shot [influenza vac split quad], Other, and Quinolones  ? ? ?Social History:  The patient  reports that she quit smoking about 17 years ago. Her smoking use included  cigarettes. She has a 50.00 pack-year smoking history. She has never used smokeless tobacco. She reports that she does not currently use alcohol. She reports that she does not use drugs.  ? ?Family History:  The patient's family history includes Alcohol abuse in her maternal uncle; Anxiety disorder in her brother; Arthritis in her maternal grandmother and mother; Asthma in her brother; Breast cancer in her paternal grandmother; Cancer in her father, paternal aunt, paternal  grandfather, paternal grandmother, and paternal uncle; Diabetes in her father, mother, and paternal aunt; Heart attack in her father; Heart disease in her father, paternal grandfather, and paternal grandmother; Obesity in her mother; Osteoporosis in her maternal grandmother; Varicose Veins in her maternal grandmother.  ? ? ?ROS:  Please see the history of present illness.   Otherwise, review of systems are positive for none.   All other systems are reviewed and negative.  ? ? ?PHYSICAL EXAM: ?VS:  BP 138/60 (BP Location: Left Arm, Patient Position: Sitting, Cuff Size: Normal)   Pulse 81   Ht 5' 3"  (1.6 m)   Wt 178 lb 8 oz (81 kg)   LMP 06/01/2019   SpO2 98%   BMI 31.62 kg/m?  , BMI Body mass index is 31.62 kg/m?. ?GEN: Well nourished, well developed, in no acute distress  ?HEENT: normal  ?Neck: no JVD, carotid bruits, or masses ?Cardiac: RRR; no murmurs, rubs, or gallops,no edema  ?Respiratory:  clear to auscultation bilaterally, normal work of breathing ?GI: soft, nontender, nondistended, + BS ?MS: no deformity or atrophy  ?Skin: warm and dry, no rash ?Neuro:  Strength and sensation are intact ?Psych: euthymic mood, full affect ? ? ?EKG:  EKG is ordered today. ?The ekg demonstrates normal sinus rhythm with sinus arrhythmia.  No significant ST or T wave changes. ? ? ?Recent Labs: ?11/26/2020: ALT 19; Hemoglobin 14.1; Platelets 277 ?03/23/2021: BUN 11; Creatinine, Ser 0.54; Potassium 4.2; Sodium 137; TSH 0.889  ? ? ?Lipid Panel ?   ?Component Value Date/Time  ? CHOL 211 (H) 03/23/2021 1203  ? CHOL 176 10/08/2020 1550  ? TRIG 154 (H) 03/23/2021 1203  ? HDL 59 03/23/2021 1203  ? HDL 42 10/08/2020 1550  ? CHOLHDL 3.6 03/23/2021 1203  ? VLDL 31 03/23/2021 1203  ? LDLCALC 121 (H) 03/23/2021 1203  ? [place] 100 (H) 10/08/2020 1550  ? ?  ? ?Wt Readings from Last 3 Encounters:  ?07/15/21 178 lb 8 oz (81 kg)  ?06/10/21 185 lb 6.4 oz (84.1 kg)  ?05/12/21 178 lb 3.2 oz (80.8 kg)  ?  ? ? ? ? ? ?ASSESSMENT AND PLAN: ? ?1.   History of atypical chest pain: Cardiac CTA in 2021 showed normal coronary arteries with calcium score of 0.  No recurrent symptoms. ? ?2.  Palpitations: She had outpatient monitor done twice which showed mild premature beats as well as short runs of SVT.  No significant arrhythmia.  Her current symptoms seem to be suggestive of premature beats.  I reassured her.  She seems to be mostly concerned about the possibility of atrial fibrillation.  I have recommended that she purchase a smart watch or an [place] monitor.  She can use metoprolol as needed. ? ?3.  Small ascending aortic aneurysm: Followed by Dr. Cyndia Bent.  This has been stable in size. ? ?4.  Essential hypertension: Elevated blood pressure resolved and she is currently not requiring any medications. ? ? ?Disposition:   FU with me in 12 months ? ?Signed, ? ?Kathlyn Sacramento, MD  ?07/15/2021  4:04 PM    ?[place] ?

## 2021-07-15 NOTE — Telephone Encounter (Signed)
Patient could be offered the 4pm DOD appt today with Dr. Fletcher Anon. ?

## 2021-07-15 NOTE — Patient Instructions (Signed)

## 2021-08-02 IMAGING — US US PELVIS COMPLETE WITH TRANSVAGINAL
1 series · 13 of 25 positions shown · non-contrast
Comparison: None

CLINICAL DATA: Postmenopausal bleeding

EXAM:
TRANSABDOMINAL AND TRANSVAGINAL ULTRASOUND OF PELVIS
TECHNIQUE: Both transabdominal and transvaginal ultrasound examinations of the
pelvis were performed. Transabdominal technique was performed for
global imaging of the pelvis including uterus, ovaries, adnexal
regions, and pelvic cul-de-sac. It was necessary to proceed with
endovaginal exam following the transabdominal exam to visualize the
uterus endometrium ovaries.

[Series 1: us pelvis complete with transvaginal · 101 acquisitions, 13 frames shown]
[im 1/101]
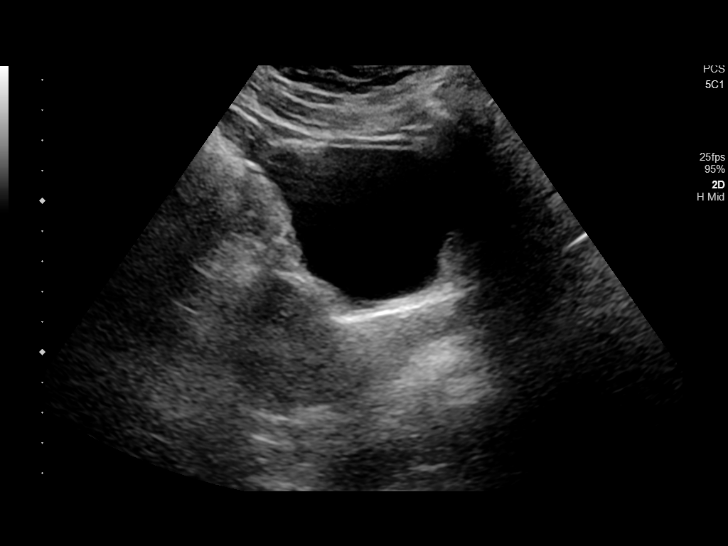
[im 9/101]
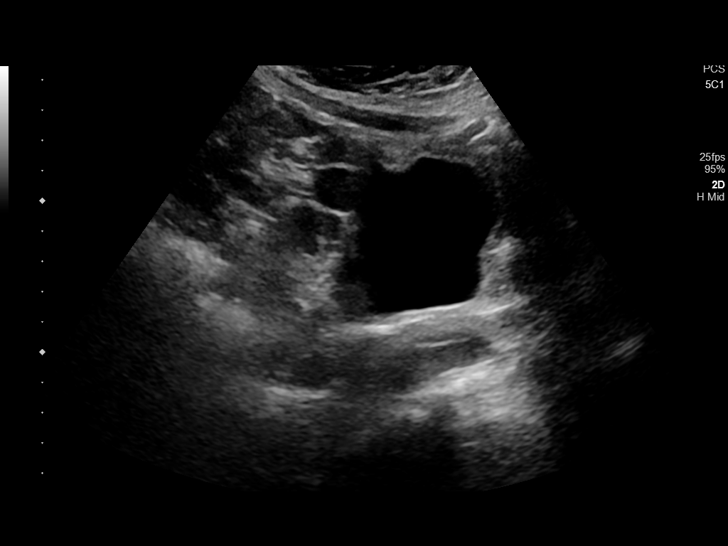
[im 17/101]
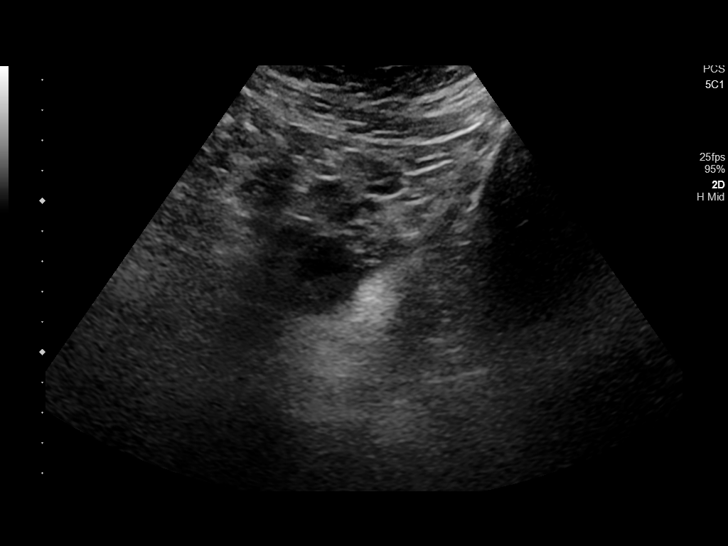
[im 26/101]
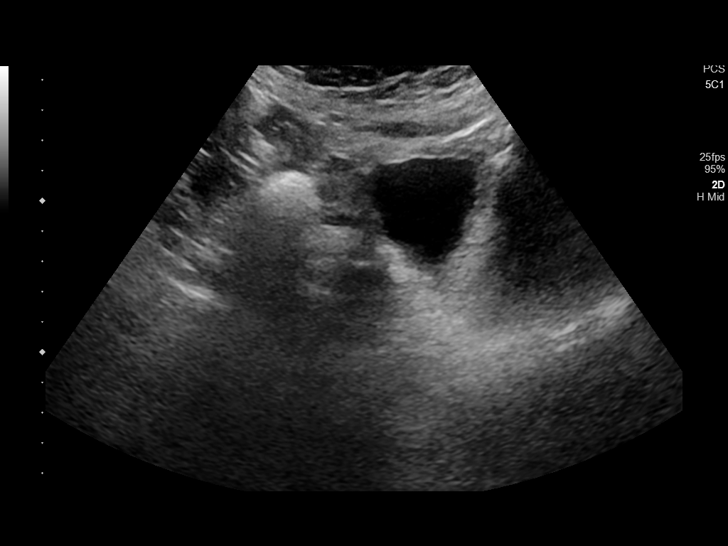
[im 34/101]
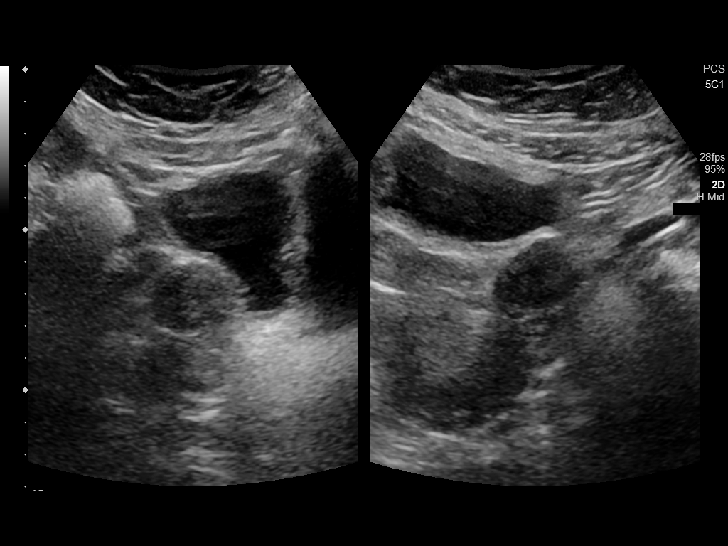
[im 42/101]
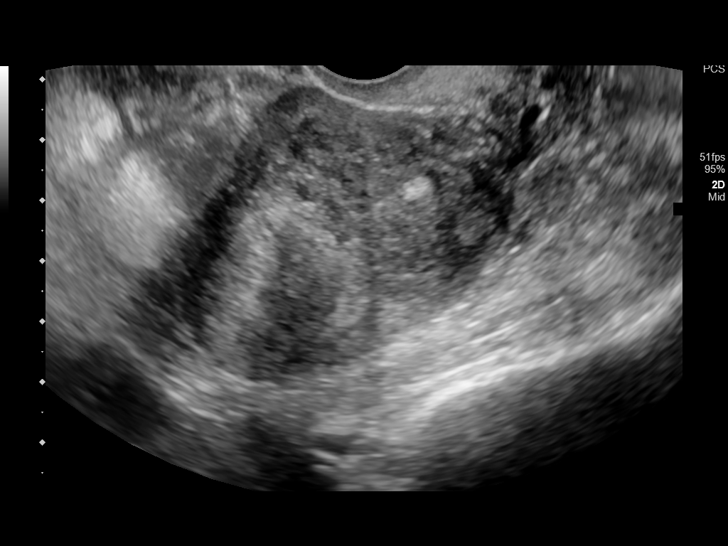
[im 51/101]
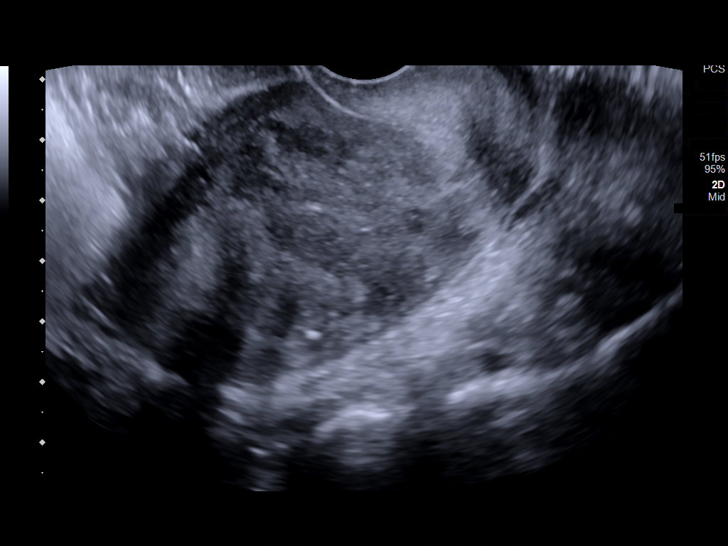
[im 59/101]
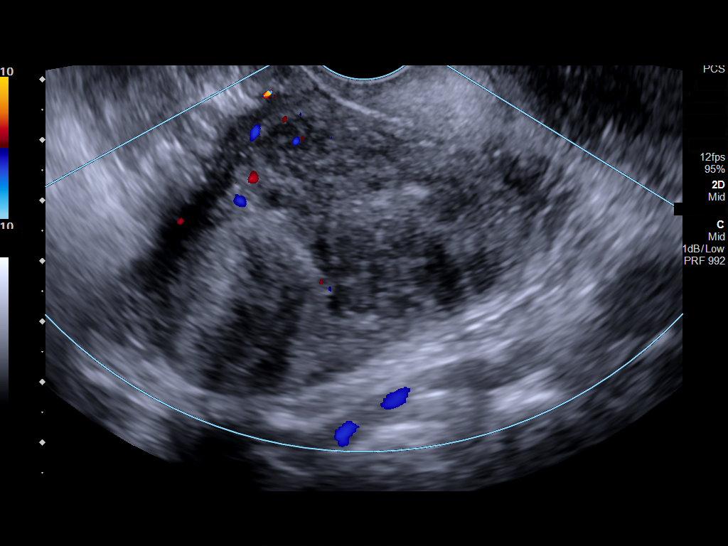
[im 67/101]
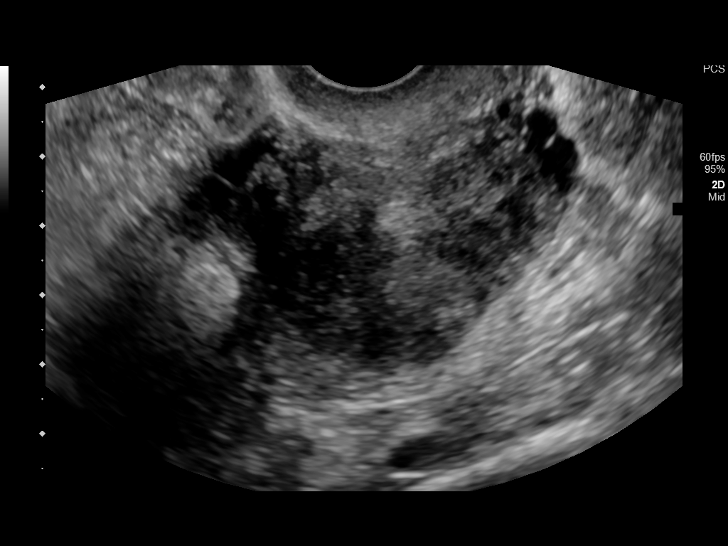
[im 76/101]
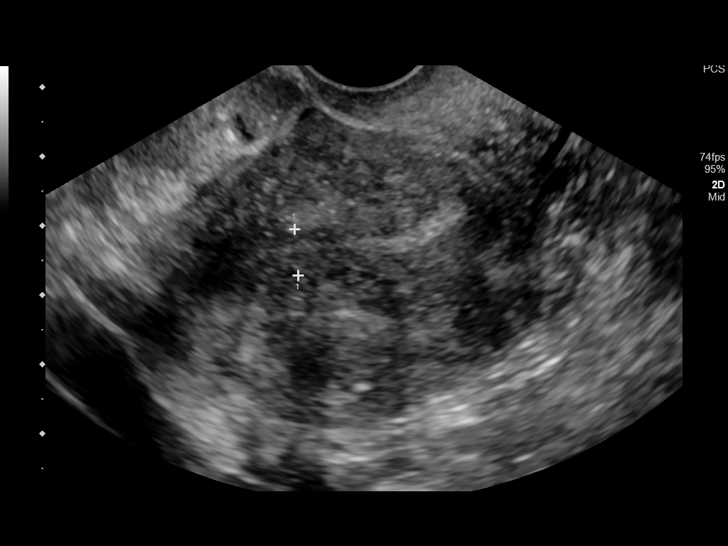
[im 84/101]
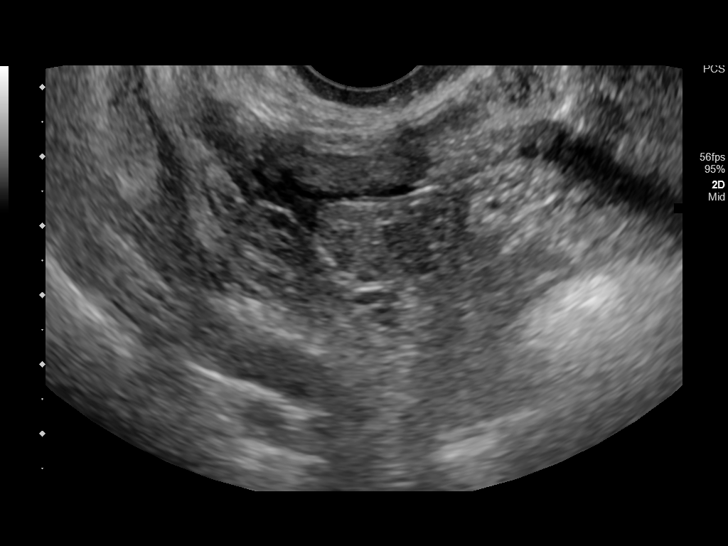
[im 92/101]
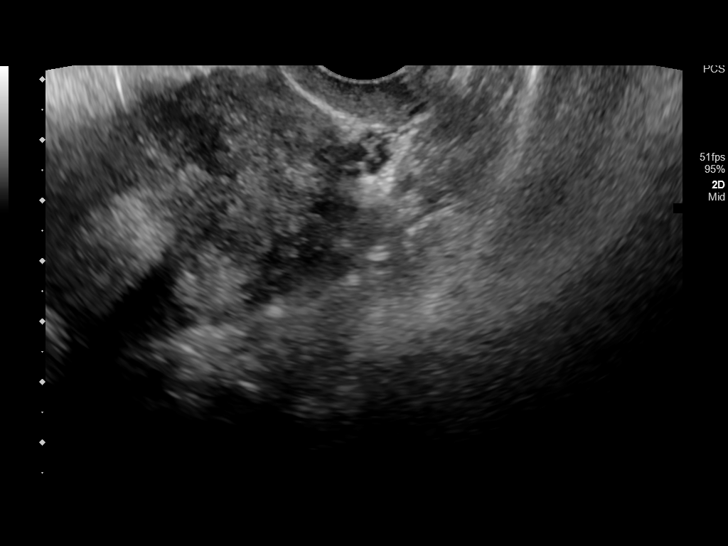
[im 101/101]
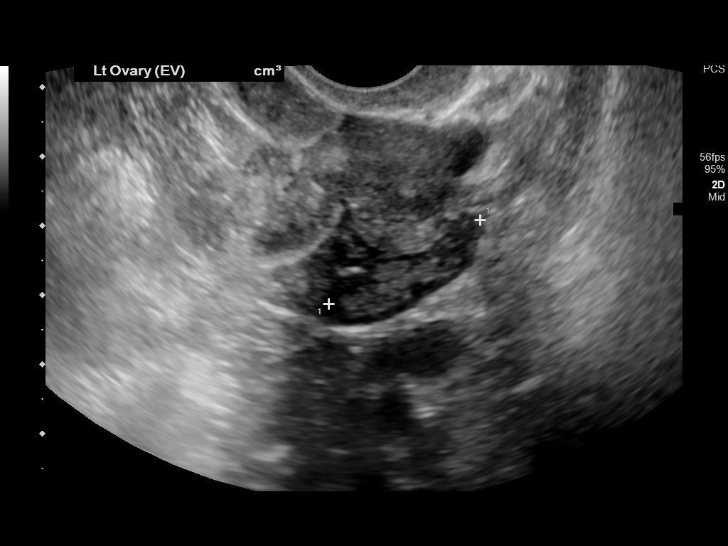

[13 of 25 positions shown; findings below may reference images not displayed]

FINDINGS: Uterus

Measurements: 8 x 4.7 x 5.1 cm = volume: 99.4 mL. Multiple uterine
masses. Heterogenous slightly echogenic mass at the posterior
uterine fundus measuring 3 x 3 x 2 cm. Fundal myometrial hypoechoic
mass measuring 1.3 x 1 x 1.5 cm. Exophytic left fundal mass
measuring 2.8 x 1.9 x 2.8 cm.

Endometrium

Thickness: 6.7 mm.  No focal abnormality visualized.

Right ovary

Measurements: 1.7 x 0.7 x 2.8 cm = volume: 1.7 mL. Normal
appearance/no adnexal mass.

Left ovary

Measurements: 1.9 x 0.8 x 2.5 cm = volume: 2.1 mL. Normal
appearance/no adnexal mass.

Other findings

No abnormal free fluid.
IMPRESSION: 1. Endometrial thickness of 6.7 mm. In the setting of
post-menopausal bleeding, endometrial sampling is indicated to
exclude carcinoma. If results are benign, sonohysterogram should be
considered for focal lesion work-up. (Ref: Radiological Reasoning:
Algorithmic Workup of Abnormal Vaginal Bleeding with Endovaginal
Sonography and Sonohysterography. AJR 1110; 191:S68-73)
2. Multiple uterine masses, consistent with fibroids.

## 2021-08-04 ENCOUNTER — Other Ambulatory Visit: Payer: Self-pay

## 2021-08-23 DIAGNOSIS — R109 Unspecified abdominal pain: Secondary | ICD-10-CM | POA: Diagnosis not present

## 2021-08-29 DIAGNOSIS — M9903 Segmental and somatic dysfunction of lumbar region: Secondary | ICD-10-CM | POA: Diagnosis not present

## 2021-08-29 DIAGNOSIS — M5431 Sciatica, right side: Secondary | ICD-10-CM | POA: Diagnosis not present

## 2021-08-29 DIAGNOSIS — M9901 Segmental and somatic dysfunction of cervical region: Secondary | ICD-10-CM | POA: Diagnosis not present

## 2021-08-29 DIAGNOSIS — M542 Cervicalgia: Secondary | ICD-10-CM | POA: Diagnosis not present

## 2021-08-31 DIAGNOSIS — M542 Cervicalgia: Secondary | ICD-10-CM | POA: Diagnosis not present

## 2021-08-31 DIAGNOSIS — M5431 Sciatica, right side: Secondary | ICD-10-CM | POA: Diagnosis not present

## 2021-08-31 DIAGNOSIS — M9901 Segmental and somatic dysfunction of cervical region: Secondary | ICD-10-CM | POA: Diagnosis not present

## 2021-08-31 DIAGNOSIS — M9903 Segmental and somatic dysfunction of lumbar region: Secondary | ICD-10-CM | POA: Diagnosis not present

## 2021-09-01 ENCOUNTER — Telehealth: Payer: Self-pay | Admitting: Internal Medicine

## 2021-09-01 NOTE — Telephone Encounter (Signed)
Good morning Dr. [name],  This patient is requesting a transfer of care to our facility.  She has never had a colonoscopy, but has had three endoscopies in the past.  (The records are in [place]).  Her GI doctor has moved to a practice in [place] and she is looking for someone here to continue her care. Currently, she is experiencing abdominal pain and was referred here by her PCP, Aloha Gell.    Please let me know if you approve her transfer.  Thank you.

## 2021-09-02 DIAGNOSIS — M5431 Sciatica, right side: Secondary | ICD-10-CM | POA: Diagnosis not present

## 2021-09-02 DIAGNOSIS — M9901 Segmental and somatic dysfunction of cervical region: Secondary | ICD-10-CM | POA: Diagnosis not present

## 2021-09-02 DIAGNOSIS — M542 Cervicalgia: Secondary | ICD-10-CM | POA: Diagnosis not present

## 2021-09-02 DIAGNOSIS — M9903 Segmental and somatic dysfunction of lumbar region: Secondary | ICD-10-CM | POA: Diagnosis not present

## 2021-09-05 DIAGNOSIS — M5431 Sciatica, right side: Secondary | ICD-10-CM | POA: Diagnosis not present

## 2021-09-05 DIAGNOSIS — M542 Cervicalgia: Secondary | ICD-10-CM | POA: Diagnosis not present

## 2021-09-05 DIAGNOSIS — M9901 Segmental and somatic dysfunction of cervical region: Secondary | ICD-10-CM | POA: Diagnosis not present

## 2021-09-05 DIAGNOSIS — M9903 Segmental and somatic dysfunction of lumbar region: Secondary | ICD-10-CM | POA: Diagnosis not present

## 2021-09-10 ENCOUNTER — Telehealth: Payer: 59 | Admitting: Physician Assistant

## 2021-09-10 DIAGNOSIS — R300 Dysuria: Secondary | ICD-10-CM | POA: Diagnosis not present

## 2021-09-10 MED ORDER — CEPHALEXIN 500 MG PO CAPS: 500.0000 mg | ORAL_CAPSULE | Freq: Two times a day (BID) | ORAL | 0 refills | Status: DC

## 2021-09-10 NOTE — Progress Notes (Signed)
E-Visit for Urinary Problems  We are sorry that you are not feeling well.  Here is how we plan to help!  Based on what you shared with me it looks like you most likely have a simple urinary tract infection.  A UTI (Urinary Tract Infection) is a bacterial infection of the bladder.  Most cases of urinary tract infections are simple to treat but a key part of your care is to encourage you to drink plenty of fluids and watch your symptoms carefully.  I have prescribed Keflex 500 mg twice a day for 7 days.  Your symptoms should gradually improve. Call us if the burning in your urine worsens, you develop worsening fever, back pain or pelvic pain or if your symptoms do not resolve after completing the antibiotic.  Urinary tract infections can be prevented by drinking plenty of water to keep your body hydrated.  Also be sure when you wipe, wipe from front to back and don't hold it in!  If possible, empty your bladder every 4 hours.  HOME CARE Drink plenty of fluids Compete the full course of the antibiotics even if the symptoms resolve Remember, when you need to go.go. Holding in your urine can increase the likelihood of getting a UTI! GET HELP RIGHT AWAY IF: You cannot urinate You get a high fever Worsening back pain occurs You see blood in your urine You feel sick to your stomach or throw up You feel like you are going to pass out  MAKE SURE YOU  Understand these instructions. Will watch your condition. Will get help right away if you are not doing well or get worse.   Thank you for choosing an e-visit.  Your e-visit answers were reviewed by a board certified advanced clinical practitioner to complete your personal care plan. Depending upon the condition, your plan could have included both over the counter or prescription medications.  Please review your pharmacy choice. Make sure the pharmacy is open so you can pick up prescription now. If there is a problem, you may contact your  provider through [employer] and have the prescription routed to another pharmacy.  Your safety is important to [place]. If you have drug allergies check your prescription carefully.   For the next 24 hours you can use MyChart to ask questions about today's visit, request a non-urgent call back, or ask for a work or school excuse. You will get an email in the next two days asking about your experience. I hope that your e-visit has been valuable and will speed your recovery.  This appointment required 5-10 minutes of patient care (this includes precharting, chart review, review of results, face-to-face care, etc.).  Inda Coke PA-C

## 2021-09-14 DIAGNOSIS — M542 Cervicalgia: Secondary | ICD-10-CM | POA: Diagnosis not present

## 2021-09-14 DIAGNOSIS — M9903 Segmental and somatic dysfunction of lumbar region: Secondary | ICD-10-CM | POA: Diagnosis not present

## 2021-09-14 DIAGNOSIS — M9901 Segmental and somatic dysfunction of cervical region: Secondary | ICD-10-CM | POA: Diagnosis not present

## 2021-09-14 DIAGNOSIS — M5431 Sciatica, right side: Secondary | ICD-10-CM | POA: Diagnosis not present

## 2021-09-15 ENCOUNTER — Encounter: Payer: Self-pay | Admitting: Family Medicine

## 2021-09-15 ENCOUNTER — Ambulatory Visit
Admission: RE | Admit: 2021-09-15 | Discharge: 2021-09-15 | Disposition: A | Discharge status: Home / Self Care | Payer: 59 | Source: Ambulatory Visit | Attending: Family Medicine | Admitting: Family Medicine

## 2021-09-15 ENCOUNTER — Ambulatory Visit: Payer: 59 | Admitting: Family Medicine

## 2021-09-15 VITALS — BP 127/84 | HR 71 | Temp 98.4°F | Wt 187.4 lb

## 2021-09-15 DIAGNOSIS — R1032 Left lower quadrant pain: Secondary | ICD-10-CM | POA: Insufficient documentation

## 2021-09-15 DIAGNOSIS — I700 Atherosclerosis of aorta: Secondary | ICD-10-CM | POA: Diagnosis not present

## 2021-09-15 DIAGNOSIS — R300 Dysuria: Secondary | ICD-10-CM | POA: Diagnosis not present

## 2021-09-15 DIAGNOSIS — K760 Fatty (change of) liver, not elsewhere classified: Secondary | ICD-10-CM | POA: Diagnosis not present

## 2021-09-15 LAB — URINALYSIS, ROUTINE W REFLEX MICROSCOPIC
Bilirubin, UA: NEGATIVE
Glucose, UA: NEGATIVE
Ketones, UA: NEGATIVE
Leukocytes,UA: NEGATIVE
Nitrite, UA: NEGATIVE
Protein,UA: NEGATIVE
RBC, UA: NEGATIVE
Specific Gravity, UA: 1.015 (ref 1.005–1.030)
Urobilinogen, Ur: 0.2 mg/dL (ref 0.2–1.0)
pH, UA: 7 (ref 5.0–7.5)

## 2021-09-15 LAB — POCT I-STAT CREATININE: Creatinine, Ser: 0.6 mg/dL (ref 0.44–1.00)

## 2021-09-15 MED ORDER — IOHEXOL 300 MG/ML  SOLN
100.0000 mL | Freq: Once | INTRAMUSCULAR | Status: AC | PRN
  Administered 2021-09-15: 100 mL via INTRAVENOUS

## 2021-09-15 NOTE — Progress Notes (Signed)
BP 127/84   Pulse 71   Temp 98.4 F (36.9 C)   Wt 187 lb 6.4 oz (85 kg)   LMP 06/01/2019   BMI 33.20 kg/m    Subjective:    Patient ID: Brittany Ochoa, adult    DOB: 22-Jun-1965, 56 y.o.   MRN: [id number]  HPI: [id number] TOPPINS is a 56 y.o. adult  Chief Complaint  Patient presents with   Abdominal Pain    Patient is requesting imagining for lower abdominal pain that radiates into her upper abdomen and her sides. Patient states her pain has been going on for months and is becoming more constant.   Urinary Tract Infection    Patient states she is currently being treated for a UTI. States her urine has a bad urine odor. Patient has been having more frequent UTI's.    ABDOMINAL PAIN- has been having pain in her abdomen for about 3 months. She has been having pain that felt like "ovary pain" that seemed to be coming more often. She notes that she has seen her OBGYN and had a US done on 08/23/21. She notes that when she had the [place] she had a spot where it was hurting a lot on palpation with the probe  Duration: 3 months Onset: sudden Severity: severe Quality: sharp and burning Location:  LLQ   Episode duration: hours Radiation: occasionally into her back and across her belly Frequency: intermittent Status: worse Treatments attempted: none Fever: no Nausea: yes Vomiting: no Weight loss: no Decreased appetite: yes Diarrhea: yes Constipation: yes Blood in stool: no Heartburn: no Jaundice: no Rash: no Dysuria/urinary frequency: yes Hematuria: no History of sexually transmitted disease: no Recurrent NSAID use: no  Relevant past medical, surgical, family and social history reviewed and updated as indicated. Interim medical history since our last visit reviewed. Allergies and medications reviewed and updated.  Review of Systems  Constitutional: Negative.   Respiratory: Negative.    Cardiovascular: Negative.   Gastrointestinal:  Positive for abdominal pain, constipation,  diarrhea and nausea. Negative for abdominal distention, anal bleeding, blood in stool, rectal pain and vomiting.  Psychiatric/Behavioral: Negative.     Per HPI unless specifically indicated above     Objective:    BP 127/84   Pulse 71   Temp 98.4 F (36.9 C)   Wt 187 lb 6.4 oz (85 kg)   LMP 06/01/2019   BMI 33.20 kg/m   Wt Readings from Last 3 Encounters:  09/15/21 187 lb 6.4 oz (85 kg)  07/15/21 178 lb 8 oz (81 kg)  06/10/21 185 lb 6.4 oz (84.1 kg)    Physical Exam Vitals and nursing note reviewed.  Constitutional:      General: She is not in acute distress.    Appearance: Normal appearance. She is not ill-appearing, toxic-appearing or diaphoretic.  HENT:     Head: Normocephalic and atraumatic.     Right Ear: External ear normal.     Left Ear: External ear normal.     Nose: Nose normal.     Mouth/Throat:     Mouth: Mucous membranes are moist.     Pharynx: Oropharynx is clear.  Eyes:     General: No scleral icterus.       Right eye: No discharge.        Left eye: No discharge.     Extraocular Movements: Extraocular movements intact.     Conjunctiva/sclera: Conjunctivae normal.     Pupils: Pupils are equal, round, and reactive to  light.  Cardiovascular:     Rate and Rhythm: Normal rate and regular rhythm.     Pulses: Normal pulses.     Heart sounds: Normal heart sounds. No murmur heard.   No friction rub. No gallop.  Pulmonary:     Effort: Pulmonary effort is normal. No respiratory distress.     Breath sounds: Normal breath sounds. No stridor. No wheezing, rhonchi or rales.  Chest:     Chest wall: No tenderness.  Abdominal:     General: Abdomen is flat. Bowel sounds are normal. There is no distension.     Palpations: Abdomen is soft. There is no mass.     Tenderness: There is abdominal tenderness. There is no right CVA tenderness, left CVA tenderness, guarding or rebound.     Hernia: No hernia is present.  Musculoskeletal:        General: Normal range of  motion.     Cervical back: Normal range of motion and neck supple.  Skin:    General: Skin is warm and dry.     Capillary Refill: Capillary refill takes less than 2 seconds.     Coloration: Skin is not jaundiced or pale.     Findings: No bruising, erythema, lesion or rash.  Neurological:     General: No focal deficit present.     Mental Status: She is alert and oriented to person, place, and time. Mental status is at baseline.  Psychiatric:        Mood and Affect: Mood normal.        Behavior: Behavior normal.        Thought Content: Thought content normal.        Judgment: Judgment normal.    Results for orders placed or performed in visit on 09/15/21  Urinalysis, Routine w reflex microscopic  Result Value Ref Range   Specific Gravity, UA 1.015 1.005 - 1.030   pH, UA 7.0 5.0 - 7.5   Color, UA Yellow Yellow   Appearance Ur Clear Clear   Leukocytes,UA Negative Negative   Protein,UA Negative Negative/Trace   Glucose, UA Negative Negative   Ketones, UA Negative Negative   RBC, UA Negative Negative   Bilirubin, UA Negative Negative   Urobilinogen, Ur 0.2 0.2 - 1.0 mg/dL   Nitrite, UA Negative Negative      Assessment & Plan:   Problem List Items Addressed This Visit   None Visit Diagnoses     Left lower quadrant abdominal pain    -  Primary   Will obtain CT abdomen. Await results. To see GI on 09/28/21. Call with any concerns.   Relevant Orders   CT Abdomen Pelvis W Contrast   Dysuria       UA clear. Continue to monitor.    Relevant Orders   Urinalysis, Routine w reflex microscopic (Completed)        Follow up plan: Return if symptoms worsen or fail to improve.

## 2021-09-16 ENCOUNTER — Encounter: Payer: Self-pay | Admitting: Family Medicine

## 2021-09-16 NOTE — Progress Notes (Signed)
Please let patient know that her CT of her abdomen did not reveal a reason for her symptoms.  Keep her appointment with GI.  The CT did show that she has fatty liver and Aortic Atherosclerosis.  Those were incidental findings not related to the abdominal pain.

## 2021-09-21 ENCOUNTER — Encounter: Payer: Self-pay | Admitting: Internal Medicine

## 2021-09-26 ENCOUNTER — Other Ambulatory Visit: Payer: Self-pay

## 2021-09-27 ENCOUNTER — Other Ambulatory Visit: Payer: Self-pay

## 2021-09-27 MED ORDER — METFORMIN HCL ER 500 MG PO TB24
500.0000 mg | ORAL_TABLET | Freq: Two times a day (BID) | ORAL | 6 refills | Status: DC
  Filled 2021-09-27 – 2021-10-21 (×2): qty 60, 30d supply, fill #0
  Filled 2021-11-16 – 2021-12-09 (×2): qty 60, 30d supply, fill #1
  Filled 2022-01-09: qty 60, 30d supply, fill #2
  Filled 2022-02-13: qty 60, 30d supply, fill #3
  Filled 2022-03-16: qty 60, 30d supply, fill #4
  Filled 2022-04-27 – 2022-05-31 (×2): qty 60, 30d supply, fill #5
  Filled 2022-07-19: qty 60, 30d supply, fill #6

## 2021-09-28 ENCOUNTER — Ambulatory Visit: Payer: 59 | Admitting: Internal Medicine

## 2021-10-06 ENCOUNTER — Encounter: Payer: Self-pay | Admitting: Family Medicine

## 2021-10-13 ENCOUNTER — Ambulatory Visit: Payer: 59 | Admitting: Gastroenterology

## 2021-10-14 ENCOUNTER — Other Ambulatory Visit: Payer: Self-pay

## 2021-10-18 ENCOUNTER — Ambulatory Visit
Admission: RE | Admit: 2021-10-18 | Discharge: 2021-10-18 | Disposition: A | Discharge status: Home / Self Care | Payer: 59 | Source: Ambulatory Visit | Attending: Family Medicine | Admitting: Family Medicine

## 2021-10-18 VITALS — BP 140/92 | HR 75 | Temp 98.9°F | Resp 16

## 2021-10-18 DIAGNOSIS — J0140 Acute pansinusitis, unspecified: Secondary | ICD-10-CM | POA: Diagnosis not present

## 2021-10-18 MED ORDER — AMOXICILLIN-POT CLAVULANATE 875-125 MG PO TABS: 1.0000 | ORAL_TABLET | Freq: Two times a day (BID) | ORAL | 0 refills | Status: DC

## 2021-10-18 NOTE — ED Provider Notes (Signed)
Brittany Ochoa    CSN: [id number] Arrival date & time: 10/18/21  4098      History   Chief Complaint Chief Complaint  Patient presents with   Sore Throat    Sinus infection/ sore throat. Hurts to swallow. - Entered by patient   Sinus Pressure    HPI Brittany Ochoa is a 56 y.o. adult.   HPI SINUSITIS Patient presents with concern for possible sinus infection. Onset:  > 1 week ago. Patient initially treated symptoms as allergy symptoms and reports no improvement with conservative therapy.  She reports now she is blowing out large copious amount of mucus.  She also has some facial pain and eustachian tube pressure.  She denies any cough, shortness of breath or wheezing.  She has not had fever.  No known sick contacts.   Remainder of  Review of Systems negative except as noted in the HPI.    Past Medical History:  Diagnosis Date   Allergy 2015   seasonal   Ascending aortic aneurysm ([place])    a. 06/2018 CTA Chest: 4.1 x 4.0 cm fusiform Asc Ao aneurysm-unchanged.  Descending thoracic aorta 2.1 cm.   Asthma    exercised induced   Dyspnea on exertion    a. 06/2017 echo: EF 60 to 65%.  No regional wall motion abnormalities.  Ascending aortic diameter 3.9 cm.  Mild MR.   Essential hypertension    Multiple thyroid nodules    OCD (obsessive compulsive disorder)    Reflux esophagitis     Patient Active Problem List   Diagnosis Date Noted   Urinary symptom or sign 05/03/2021   Gastric intestinal metaplasia    Hepatic steatosis 10/08/2020   Dysautonomia ([place]) 10/08/2020   BMI 33.0-33.9,adult 10/08/2020   Gastric erythema    Goiter 08/25/2020   Tachycardia 08/25/2020   Autoimmune disease ([place]) 05/27/2019   Essential hypertension 12/16/2018   Menopause 10/04/2018   Esophageal dysphagia    Stomach irritation    Exercise-induced asthma 10/26/2017   Multiple thyroid nodules 07/15/2017   Mitral regurgitation 07/15/2017   History of radiation exposure 07/15/2017   IFG  (impaired fasting glucose) 05/04/2017   Ascending aortic aneurysm ([place]) 05/04/2017   Pineal gland cyst 09/28/2016    Past Surgical History:  Procedure Laterality Date   [place]   breast reduction   ESOPHAGOGASTRODUODENOSCOPY (EGD) WITH PROPOFOL N/A 01/24/2018   Procedure: ESOPHAGOGASTRODUODENOSCOPY (EGD) WITH PROPOFOL;  Surgeon: [name], MD;  Location: [facility] ENDOSCOPY;  Service: Endoscopy;  Laterality: N/A;   ESOPHAGOGASTRODUODENOSCOPY (EGD) WITH PROPOFOL N/A 09/09/2020   Procedure: ESOPHAGOGASTRODUODENOSCOPY (EGD) WITH PROPOFOL;  Surgeon: [name], MD;  Location: [facility] ENDOSCOPY;  Service: Endoscopy;  Laterality: N/A;   ESOPHAGOGASTRODUODENOSCOPY (EGD) WITH PROPOFOL N/A 02/17/2021   Procedure: ESOPHAGOGASTRODUODENOSCOPY (EGD) WITH PROPOFOL;  Surgeon: [name], MD;  Location: [facility] ENDOSCOPY;  Service: Endoscopy;  Laterality: N/A;   lipoma removal  2007   back   REDUCTION MAMMAPLASTY Bilateral 12/01/1996    OB History     Gravida  0   Para      Term      Preterm      AB      Living         SAB      IAB      Ectopic      Multiple      Live Births  Obstetric Comments  Menstrual age: 3   Age 1st Pregnancy: 41            Home Medications    Prior to Admission medications   Medication Sig Start Date End Date Taking? Authorizing Provider  amoxicillin-clavulanate (AUGMENTIN) 875-125 MG tablet Take 1 tablet by mouth 2 (two) times daily. 10/18/21  Yes Scot Jun, FNP  metFORMIN (GLUCOPHAGE-XR) 500 MG 24 hr tablet TAKE 1 TABLET BY MOUTH TWICE DAILY 09/26/21  Yes   metoprolol tartrate (LOPRESSOR) 25 MG tablet Take 1 tablet (25 mg total) by mouth 2 (two) times daily. 12/04/19 02/13/20  [name], MD    Family History Family History  Problem Relation Age of Onset   Diabetes Mother    Arthritis Mother    Obesity Mother    Diabetes Father    Heart disease  Father    Heart attack Father    Cancer Father        bone   Heart disease Paternal Grandmother    Cancer Paternal Grandmother        bone   Breast cancer Paternal Grandmother    Cancer Paternal Grandfather        pancreatic?   Heart disease Paternal Grandfather    Osteoporosis Maternal Grandmother    Arthritis Maternal Grandmother    Varicose Veins Maternal Grandmother    Alcohol abuse Maternal Uncle    Asthma Brother    Anxiety disorder Brother    Cancer Paternal Aunt    Diabetes Paternal Aunt    Cancer Paternal Uncle     Social History Social History   Tobacco Use   Smoking status: Former    Packs/day: 2.00    Years: 25.00    Total pack years: 50.00    Types: Cigarettes    Quit date: 04/17/2004    Years since quitting: 17.5   Smokeless tobacco: Never  Vaping Use   Vaping Use: Never used  Substance Use Topics   Alcohol use: Not Currently   Drug use: No    Comment: past only     Allergies   Pholcodine, Codeine, Medical provider ez flu shot [influenza vac split quad], and Quinolones   Review of Systems Review of Systems Pertinent negatives listed in HPI   Physical Exam Triage Vital Signs ED Triage Vitals  Enc Vitals Group     BP 10/18/21 0848 (!) 140/92     Pulse Rate 10/18/21 0848 75     Resp 10/18/21 0848 16     Temp 10/18/21 0848 98.9 F (37.2 C)     Temp Source 10/18/21 0848 Oral     SpO2 10/18/21 0848 96 %     Weight --      Height --      Head Circumference --      Peak Flow --      Pain Score 10/18/21 0850 0     Pain Loc --      Pain Edu? --      Excl. in [place]? --    No data found.  Updated Vital Signs BP (!) 140/92 (BP Location: Right Arm)   Pulse 75   Temp 98.9 F (37.2 C) (Oral)   Resp 16   LMP 06/01/2019   SpO2 96%   Visual Acuity Right Eye Distance:   Left Eye Distance:   Bilateral Distance:    Right Eye Near:   Left Eye Near:    Bilateral Near:     Physical Exam  General Appearance:    Alert, cooperative, no  distress  HENT:   Normocephalic, ears normal, nares mucosal edema with congestion, rhinorrhea, oropharynx mild erythema w/o exudate or swelling    Eyes:    PERRL, conjunctiva/corneas clear, EOM's intact       Lungs:     Clear to auscultation bilaterally, respirations unlabored  Heart:    Regular rate and rhythm  Neurologic:   Awake, alert, oriented x 3. No apparent focal neurological           defect.      UC Treatments / Results  Labs (all labs ordered are listed, but only abnormal results are displayed) Labs Reviewed - No data to display  EKG   Radiology No results found.  Procedures Procedures (including critical care time)  Medications Ordered in UC Medications - No data to display  Initial Impression / Assessment and Plan / UC Course  I have reviewed the triage vital signs and the nursing notes.  Pertinent labs & imaging results that were available during my care of the patient were reviewed by me and considered in my medical decision making (see chart for details).    Acute non-recurrent pansinusitis  Augmentin BID x 10 days Return if symptoms worsen or doesn't improve with treatment. Final Clinical Impressions(s) / UC Diagnoses   Final diagnoses:  Acute non-recurrent pansinusitis   Discharge Instructions   None    ED Prescriptions     Medication Sig Dispense Auth. Provider   amoxicillin-clavulanate (AUGMENTIN) 875-125 MG tablet Take 1 tablet by mouth 2 (two) times daily. 20 tablet Scot Jun, FNP      PDMP not reviewed this encounter.   Scot Jun, [place] 10/18/21 [phone]

## 2021-10-18 NOTE — ED Triage Notes (Signed)
Pt presents with sinus pressure/pain, scratchy throat, nasal congestion for over 1 week.

## 2021-10-19 ENCOUNTER — Other Ambulatory Visit: Payer: Self-pay

## 2021-10-19 ENCOUNTER — Ambulatory Visit (INDEPENDENT_AMBULATORY_CARE_PROVIDER_SITE_OTHER): Payer: 59 | Admitting: Urology

## 2021-10-19 ENCOUNTER — Other Ambulatory Visit: Payer: Self-pay | Admitting: *Deleted

## 2021-10-19 ENCOUNTER — Encounter: Payer: Self-pay | Admitting: Urology

## 2021-10-19 VITALS — Ht 63.0 in | Wt 187.0 lb

## 2021-10-19 DIAGNOSIS — N390 Urinary tract infection, site not specified: Secondary | ICD-10-CM

## 2021-10-19 DIAGNOSIS — R350 Frequency of micturition: Secondary | ICD-10-CM

## 2021-10-19 DIAGNOSIS — R102 Pelvic and perineal pain: Secondary | ICD-10-CM | POA: Diagnosis not present

## 2021-10-19 DIAGNOSIS — R3915 Urgency of urination: Secondary | ICD-10-CM

## 2021-10-19 LAB — URINALYSIS, COMPLETE
Appearance Ur: NEGATIVE
Bilirubin, UA: NEGATIVE
Color, UA: NEGATIVE
Glucose, UA: NEGATIVE
Ketones, UA: NEGATIVE
Leukocytes,UA: NEGATIVE
Nitrite, UA: NEGATIVE
Protein,UA: NEGATIVE
RBC, UA: NEGATIVE
Specific Gravity, UA: 1.01 (ref 1.005–1.030)
Urobilinogen, Ur: 0.2 mg/dL (ref 0.2–1.0)
pH, UA: 5.5 (ref 5.0–7.5)

## 2021-10-19 LAB — MICROSCOPIC EXAMINATION: Bacteria, UA: NONE SEEN

## 2021-10-19 MED ORDER — ESTRADIOL 0.1 MG/GM VA CREA
TOPICAL_CREAM | VAGINAL | 12 refills | Status: DC
  Filled 2021-10-19: qty 42.5, 90d supply, fill #0
  Filled 2022-07-19: qty 42.5, 90d supply, fill #1

## 2021-10-19 NOTE — Addendum Note (Signed)
Addended by: Verlene Mayer A on: 10/19/2021 08:12 AM   Modules accepted: Orders

## 2021-10-19 NOTE — Progress Notes (Signed)
10/19/21 4:53 PM   Brittany Ochoa January 07, 1966 [id number]  Referring provider:  [name], DO [address],  [place] [address] Chief Complaint  Patient presents with   Recurrent UTI      HPI: Brittany Ochoa is a 56 y.o.adult who presents today for further evaluation of "rUTIs".  She reports that about 6 months ago, she began having frequent which she describes as "urinary tract infections" that were treated by her PCP at which time she would have lower abdominal pressure, urgency frequency and some burning/irritation around the exterior of her vagina/urethra.  She also experiences left lower pelvic pain which is unrelated to voiding.  She notes that these tended to be associated with times where she was having increased hot flashes.  She has seen OB/GYN to rule out ovarian issues and they did not feel like this was the underlying cause.  She is also seeing GI in the near future and will be undergoing her first colonoscopy.  She reports that every time she has had a urinalysis, comes back negative.  Despite this, she is been treated with antibiotics and told that these are related to urinary tract infection.  She does report that she has always been very aggressive about cleaning her perineal vaginal area.  In the past, she was using a soap with a tingling product and now switched to a more benign soap.  She washes aggressively with a cloth.  She also uses wipes.  She is not sexually active.  She has not had a menstrual cycle since 2020.  She underwent a CT abdomen and pelvis on to further evaluate left lower quadrant abdominal pain. It visualized kidneys are normal, without renal calculi, focal lesion, or hydronephrosis. Bladder is unremarkable.   PMH: Past Medical History:  Diagnosis Date   Allergy 2015   seasonal   Ascending aortic aneurysm ([place])    a. 06/2018 CTA Chest: 4.1 x 4.0 cm fusiform Asc Ao aneurysm-unchanged.  Descending thoracic aorta 2.1 cm.   Asthma     exercised induced   Dyspnea on exertion    a. 06/2017 echo: EF 60 to 65%.  No regional wall motion abnormalities.  Ascending aortic diameter 3.9 cm.  Mild MR.   Essential hypertension    Multiple thyroid nodules    OCD (obsessive compulsive disorder)    Reflux esophagitis     Surgical History: Past Surgical History:  Procedure Laterality Date   [place]   breast reduction   ESOPHAGOGASTRODUODENOSCOPY (EGD) WITH PROPOFOL N/A 01/24/2018   Procedure: ESOPHAGOGASTRODUODENOSCOPY (EGD) WITH PROPOFOL;  Surgeon: [name], MD;  Location: [facility];  Service: Endoscopy;  Laterality: N/A;   ESOPHAGOGASTRODUODENOSCOPY (EGD) WITH PROPOFOL N/A 09/09/2020   Procedure: ESOPHAGOGASTRODUODENOSCOPY (EGD) WITH PROPOFOL;  Surgeon: [name], MD;  Location: [facility];  Service: Endoscopy;  Laterality: N/A;   ESOPHAGOGASTRODUODENOSCOPY (EGD) WITH PROPOFOL N/A 02/17/2021   Procedure: ESOPHAGOGASTRODUODENOSCOPY (EGD) WITH PROPOFOL;  Surgeon: [name], MD;  Location: [facility];  Service: Endoscopy;  Laterality: N/A;   lipoma removal  2007   back   REDUCTION MAMMAPLASTY Bilateral 12/01/1996    Home Medications:  Allergies as of 10/19/2021       Reactions   Pholcodine    Other reaction(s): wheezing   Codeine    Medical Provider Ez Flu Shot [influenza Vac Split Quad] Other (See Comments)   Severe myalgias and bone pain   Quinolones  Aortic enlargement         Medication List        Accurate as of October 19, 2021  4:53 PM. If you have any questions, ask your nurse or doctor.          amoxicillin-clavulanate 875-125 MG tablet Commonly known as: AUGMENTIN Take 1 tablet by mouth 2 (two) times daily.   estradiol 0.1 MG/GM vaginal cream Commonly known as: ESTRACE Discard applicator. Apply pea-sized amount to tip of finger to urethra before bed. Wash hands well after application. Use Monday, Wednesday and  Friday. (Estrogen Cream Instruction  Discard applicator  Apply pea sized amount to tip of finger to urethra before bed. Wash hands well after application. Use Monday, Wednesday and Friday) Started by: Hollice Espy, MD   metFORMIN 500 MG 24 hr tablet Commonly known as: GLUCOPHAGE-XR TAKE 1 TABLET BY MOUTH TWICE DAILY        Allergies:  Allergies  Allergen Reactions   Pholcodine     Other reaction(s): wheezing   Codeine    Medical Provider Ez Flu Shot [Influenza Vac Split Quad] Other (See Comments)    Severe myalgias and bone pain   Quinolones     Aortic enlargement     Family History: Family History  Problem Relation Age of Onset   Diabetes Mother    Arthritis Mother    Obesity Mother    Diabetes Father    Heart disease Father    Heart attack Father    Cancer Father        bone   Heart disease Paternal Grandmother    Cancer Paternal Grandmother        bone   Breast cancer Paternal Grandmother    Cancer Paternal Grandfather        pancreatic?   Heart disease Paternal Grandfather    Osteoporosis Maternal Grandmother    Arthritis Maternal Grandmother    Varicose Veins Maternal Grandmother    Alcohol abuse Maternal Uncle    Asthma Brother    Anxiety disorder Brother    Cancer Paternal Aunt    Diabetes Paternal Aunt    Cancer Paternal Uncle     Social History:  reports that she quit smoking about 17 years ago. Her smoking use included cigarettes. She has a 50.00 pack-year smoking history. She has never used smokeless tobacco. She reports that she does not currently use alcohol. She reports that she does not use drugs.   Physical Exam: Ht '5\' 3"'$  (1.6 m)   Wt 187 lb (84.8 kg)   LMP 06/01/2019   BMI 33.13 kg/m   Constitutional:  Alert and oriented, No acute distress. HEENT: [place] AT, moist mucus membranes.  Trachea midline, no masses. Cardiovascular: No clubbing, cyanosis, or edema. Respiratory: Normal respiratory effort, no increased work of  breathing. Skin: No rashes, bruises or suspicious lesions. Neurologic: Grossly intact, no focal deficits, moving all 4 extremities. Psychiatric: Normal mood and affect.  Laboratory Data:  Lab Results  Component Value Date   CREATININE 0.60 09/15/2021   Lab Results  Component Value Date   HGBA1C 5.7 (H) 06/10/2021    Urinalysis Results for orders placed or performed in visit on 10/19/21  Microscopic Examination   Urine  Result Value Ref Range   WBC, UA 0-5 0 - 5 /hpf   RBC, Urine 0-2 0 - 2 /hpf   Epithelial Cells (non renal) 0-10 0 - 10 /hpf   Bacteria, UA None seen None seen/Few  Urinalysis, Complete  Result  Value Ref Range   Specific Gravity, UA 1.010 1.005 - 1.030   pH, UA 5.5 5.0 - 7.5   Color, UA Negative Yellow   Appearance Ur Negative Clear   Leukocytes,UA Negative Negative   Protein,UA Negative Negative/Trace   Glucose, UA Negative Negative   Ketones, UA Negative Negative   RBC, UA Negative Negative   Bilirubin, UA Negative Negative   Urobilinogen, Ur 0.2 0.2 - 1.0 mg/dL   Nitrite, UA Negative Negative   Microscopic Examination See below:      Pertinent Imaging: CLINICAL DATA:  Left lower quadrant abdominal pain   EXAM: CT ABDOMEN AND PELVIS WITH CONTRAST   TECHNIQUE: Multidetector CT imaging of the abdomen and pelvis was performed using the standard protocol following bolus administration of intravenous contrast.   RADIATION DOSE REDUCTION: This exam was performed according to the departmental dose-optimization program which includes automated exposure control, adjustment of the mA and/or kV according to patient size and/or use of iterative reconstruction technique.   CONTRAST:  113m OMNIPAQUE IOHEXOL 300 MG/ML  SOLN   COMPARISON:  None Available.   FINDINGS: Lower chest: No acute pleural or parenchymal lung disease.   Hepatobiliary: Diffuse hepatic steatosis. No focal liver abnormality. The gallbladder is unremarkable. No biliary  duct dilation.   Pancreas: Unremarkable. No pancreatic ductal dilatation or surrounding inflammatory changes.   Spleen: Normal in size without focal abnormality.   Adrenals/Urinary Tract: Adrenal glands are unremarkable. Kidneys are normal, without renal calculi, focal lesion, or hydronephrosis. Bladder is unremarkable.   Stomach/Bowel: No bowel obstruction or ileus. Normal appendix right lower quadrant. No bowel wall thickening or inflammatory change.   Vascular/Lymphatic: Aortic atherosclerosis. No enlarged abdominal or pelvic lymph nodes.   Reproductive: Uterus and bilateral adnexa are unremarkable.   Other: No free fluid or free intraperitoneal gas. No abdominal wall hernia.   Musculoskeletal: No acute or destructive bony lesions. Prominent spondylosis at L1-2, L3-4, and L5-S1. Reconstructed images demonstrate no additional findings.   IMPRESSION: 1. No acute intra-abdominal or intrapelvic process. 2. Hepatic steatosis. 3.  Aortic Atherosclerosis (ICD10-I70.0).     Electronically Signed   By: MRanda NgoM.D.   On: 09/15/2021 18:21    I have personally reviewed the images and agree with radiologist interpretation.    Assessment & Plan:    Urgency/ frequency/ pelvic pain Symptoms are likely hormonally related, possibly related to periurethral atrophy causing bladder pain syndrome and spasm versus underlying conditions such as IC  Based on her negative urinalyses on multiple occasions, absence of blood, do not suspect any underlying pathology including bladder cancer or infectious etiology.  The skin also very reassuring.  Urged her not to think if this is recurrent urinary tract infection as there is no documentation to support this.  We discussed the addition of topical estrogen cream per urethral meatus, 3 times a week with very low systemic absorption.  We can recheck her urinary symptoms in about 3 months.  In addition to the above, recommended that she do  pursue additional work-up including GYN and GI has initiated.  Follow-up 3 months to reassess symptoms    [facility] [address] [place] [place] [phone]

## 2021-10-21 ENCOUNTER — Other Ambulatory Visit: Payer: Self-pay

## 2021-11-04 ENCOUNTER — Other Ambulatory Visit (INDEPENDENT_AMBULATORY_CARE_PROVIDER_SITE_OTHER): Payer: 59

## 2021-11-04 ENCOUNTER — Encounter: Payer: Self-pay | Admitting: Internal Medicine

## 2021-11-04 ENCOUNTER — Other Ambulatory Visit: Payer: Self-pay

## 2021-11-04 ENCOUNTER — Ambulatory Visit (INDEPENDENT_AMBULATORY_CARE_PROVIDER_SITE_OTHER): Payer: 59 | Admitting: Internal Medicine

## 2021-11-04 VITALS — BP 120/80 | HR 84 | Ht 63.0 in | Wt 182.8 lb

## 2021-11-04 DIAGNOSIS — R110 Nausea: Secondary | ICD-10-CM

## 2021-11-04 DIAGNOSIS — R1030 Lower abdominal pain, unspecified: Secondary | ICD-10-CM

## 2021-11-04 DIAGNOSIS — K31A0 Gastric intestinal metaplasia, unspecified: Secondary | ICD-10-CM | POA: Diagnosis not present

## 2021-11-04 DIAGNOSIS — R1310 Dysphagia, unspecified: Secondary | ICD-10-CM | POA: Diagnosis not present

## 2021-11-04 DIAGNOSIS — K219 Gastro-esophageal reflux disease without esophagitis: Secondary | ICD-10-CM

## 2021-11-04 DIAGNOSIS — R197 Diarrhea, unspecified: Secondary | ICD-10-CM | POA: Diagnosis not present

## 2021-11-04 LAB — COMPREHENSIVE METABOLIC PANEL
ALT: 31 U/L (ref 0–35)
AST: 27 U/L (ref 0–37)
Albumin: 5 g/dL (ref 3.5–5.2)
Alkaline Phosphatase: 75 U/L (ref 39–117)
BUN: 15 mg/dL (ref 6–23)
CO2: 30 mEq/L (ref 19–32)
Calcium: 9.8 mg/dL (ref 8.4–10.5)
Chloride: 99 mEq/L (ref 96–112)
Creatinine, Ser: 0.69 mg/dL (ref 0.40–1.20)
GFR: 98.53 mL/min (ref >60.00)
Glucose, Bld: 91 mg/dL (ref 70–99)
Potassium: 3.9 mEq/L (ref 3.5–5.1)
Sodium: 140 mEq/L (ref 135–145)
Total Bilirubin: 0.8 mg/dL (ref 0.2–1.2)
Total Protein: 7.5 g/dL (ref 6.0–8.3)

## 2021-11-04 LAB — CBC WITH DIFFERENTIAL/PLATELET
Basophils Absolute: 0 10*3/uL (ref 0.0–0.1)
Basophils Relative: 0.7 % (ref 0.0–3.0)
Eosinophils Absolute: 0.1 10*3/uL (ref 0.0–0.7)
Eosinophils Relative: 1.4 % (ref 0.0–5.0)
HCT: 41.4 % (ref 36.0–46.0)
Hemoglobin: 14 g/dL (ref 12.0–15.0)
Lymphocytes Relative: 29 % (ref 12.0–46.0)
Lymphs Abs: 1.8 10*3/uL (ref 0.7–4.0)
MCHC: 33.8 g/dL (ref 30.0–36.0)
MCV: 89.4 fl (ref 78.0–100.0)
Monocytes Absolute: 0.4 10*3/uL (ref 0.1–1.0)
Monocytes Relative: 6.2 % (ref 3.0–12.0)
Neutro Abs: 4 10*3/uL (ref 1.4–7.7)
Neutrophils Relative %: 62.7 % (ref 43.0–77.0)
Platelets: 257 10*3/uL (ref 150.0–400.0)
RBC: 4.64 Mil/uL (ref 3.87–5.11)
RDW: 13.2 % (ref 11.5–15.5)
WBC: 6.3 10*3/uL (ref 4.0–10.5)

## 2021-11-04 LAB — C-REACTIVE PROTEIN: CRP: <1 mg/dL (ref 0.5–20.0)

## 2021-11-04 LAB — LIPASE: Lipase: 42 U/L (ref 11.0–59.0)

## 2021-11-04 MED ORDER — PEG 3350-KCL-NA BICARB-NACL 420 G PO SOLR
4000.0000 mL | Freq: Once | ORAL | 0 refills | Status: AC
  Filled 2021-11-04: qty 4000, 1d supply, fill #0

## 2021-11-04 NOTE — Patient Instructions (Addendum)
If you are age 54 or older, your body mass index should be between 23-30. Your Body mass index is 32.38 kg/m. If this is out of the aforementioned range listed, please consider follow up with your Primary Care Provider.  If you are age 40 or younger, your body mass index should be between 19-25. Your Body mass index is 32.38 kg/m. If this is out of the aformentioned range listed, please consider follow up with your Primary Care Provider.   ________________________________________________________  The [facility] providers would like to encourage you to use [facility] to communicate with providers for non-urgent requests or questions.  Due to long hold times on the telephone, sending your provider a message by [facility] may be a faster and more efficient way to get a response.  Please allow 48 business hours for a response.  Please remember that this is for non-urgent requests.  _______________________________________________________   Dennis Bast have been scheduled for an endoscopy and colonoscopy. Please follow the written instructions given to you at your visit today. Please pick up your prep supplies at the pharmacy within the next 1-3 days. If you use inhalers (even only as needed), please bring them with you on the day of your procedure.   Your provider has requested that you go to the basement level for lab work before leaving today. Press "B" on the elevator. The lab is located at the first door on the left as you exit the elevator.   Due to recent changes in healthcare laws, you may see the results of your imaging and laboratory studies on MyChart before your provider has had a chance to review them.  We understand that in some cases there may be results that are confusing or concerning to you. Not all laboratory results come back in the same time frame and the provider may be waiting for multiple results in order to interpret others.  Please give [place] 48 hours in order for your provider to thoroughly  review all the results before contacting the office for clarification of your results.    Thank you for entrusting me with your care and choosing [facility].  Dr. [name]

## 2021-11-04 NOTE — Progress Notes (Signed)
Chief Complaint: Dysphagia, GERD, lower abdominal pain  HPI : 56 year old female with history of GIM, DM, asthma, OCD, and arthritis presents with dysphagia, GERD ,and lower abdominal pain  Her lower ab pain started in 06/2021. Denies any inciting event. The pain was located in the suprapubic area. She went in 08/2021 for an ultrasound with her OB/GYN that showed her gynecologic organs looked normal. It does not matter what eats or how much she eats. The pain is constantly there, worsening over time. The pain is dull, stabbing, and/or shooting pain. Pressing on the abdomen seems to make the pain worse. She was having UTIs every other month so she saw a urologist who stated that she may have vaginal dryness. She has not had any issues since she was started on estrogen cream. Denies dysuria. She is post-menopausal. She has history of fibroids but these have been better since she went into menopause. She is having diarrhea. She has 2-3 BMs per day. In the past 2 months she has had rare solid BMs. The BMs are usually super soft or liquid. She does sometimes have a nocturnal stool. Endorses dysphagia for about the last year. She did have an EGD done in 02/2021, but her dysphagia started after her last EGD. She had major life changes last year so she was not able to seek out a physician when her dysphagia first started. She has trouble swallowing thick foods such as rice or like thick foods. Endorses dysphagia to solids, not liquids. Dysphagia seems to be getting worse over time. She does have heartburn that she feels on occasoin. She takes Pepto Bismol for that. Endorses nausea that occurs on occasion. Denies vomiting. Denies marijuana use. Rarely drinks alcohol. Father, grandmother, and great grandmother all had stomach cancer. Denies NSAID use. She eats 2 meals per day. She does not like taking medications in general. She has never had a colonoscopy in the past. She did do one cologuard test in 04/2020 that was  negative. 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  Wt Readings from Last 3 Encounters:  11/04/21 182 lb 12.8 oz (82.9 kg)  10/19/21 187 lb (84.8 kg)  09/15/21 187 lb 6.4 oz (85 kg)   Past Medical History:  Diagnosis Date   Allergy 2015   seasonal   Arthritis    Ascending aortic aneurysm ([place])    a. 06/2018 CTA Chest: 4.1 x 4.0 cm fusiform Asc Ao aneurysm-unchanged.  Descending thoracic aorta 2.1 cm.   Asthma    exercised induced   Diabetes ([place])    Dyspnea on exertion    a. 06/2017 echo: EF 60 to 65%.  No regional wall motion abnormalities.  Ascending aortic diameter 3.9 cm.  Mild MR.   Essential hypertension    Lipoma    of neck and chest   Multiple thyroid nodules    OCD (obsessive compulsive disorder)    Reflux esophagitis      Past Surgical History:  Procedure Laterality Date   COSMETIC SURGERY  1997   breast reduction   ESOPHAGOGASTRODUODENOSCOPY (EGD) WITH PROPOFOL N/A 01/24/2018   Procedure: ESOPHAGOGASTRODUODENOSCOPY (EGD) WITH PROPOFOL;  Surgeon: [name], MD;  Location: [facility] ENDOSCOPY;  Service: Endoscopy;  Laterality: N/A;   ESOPHAGOGASTRODUODENOSCOPY (EGD) WITH PROPOFOL N/A 09/09/2020   Procedure: ESOPHAGOGASTRODUODENOSCOPY (EGD) WITH PROPOFOL;  Surgeon: [name], MD;  Location: [facility] ENDOSCOPY;  Service: Endoscopy;  Laterality: N/A;   ESOPHAGOGASTRODUODENOSCOPY (EGD) WITH PROPOFOL N/A 02/17/2021   Procedure: ESOPHAGOGASTRODUODENOSCOPY (EGD) WITH  PROPOFOL;  Surgeon: [name], MD;  Location: [facility] ENDOSCOPY;  Service: Endoscopy;  Laterality: N/A;   lipoma removal  2007   back   REDUCTION MAMMAPLASTY Bilateral 12/01/1996   Family History  Problem Relation Age of Onset   Diabetes Mother    Arthritis Mother    Obesity Mother    Diabetes Father    Heart disease Father    Heart attack Father    Stomach cancer Father 72   Asthma Brother    Anxiety disorder Brother    Osteoporosis Maternal Grandmother    Arthritis Maternal  Grandmother    Varicose Veins Maternal Grandmother    Heart disease Paternal Grandmother    Stomach cancer Paternal Grandmother    Breast cancer Paternal Grandmother    Cancer Paternal Grandfather        spleen   Heart disease Paternal Grandfather    Alcohol abuse Maternal Uncle    Cancer Paternal Aunt        rare blood cancer; non genetic   Diabetes Paternal Aunt    Cancer Paternal Uncle        rare blood cancer; non genetic   Social History   Tobacco Use   Smoking status: Former    Packs/day: 2.00    Years: 25.00    Total pack years: 50.00    Types: Cigarettes    Quit date: 04/17/2004    Years since quitting: 17.5   Smokeless tobacco: Never  Vaping Use   Vaping Use: Never used  Substance Use Topics   Alcohol use: Not Currently   Drug use: No    Comment: past only   Current Outpatient Medications  Medication Sig Dispense Refill   estradiol (ESTRACE) 0.1 MG/GM vaginal cream Estrogen Cream Instruction  Discard applicator  Apply pea sized amount to tip of finger to urethra before bed. Wash hands well after application. Use Monday, Wednesday and Friday 42.5 g 12   metFORMIN (GLUCOPHAGE-XR) 500 MG 24 hr tablet TAKE 1 TABLET BY MOUTH TWICE DAILY (Patient taking differently: Take 500 mg by mouth 2 (two) times daily. If eating 1 meal daily, will take only 1 pill. If eating 2 meals daily, will take twice daily) 60 tablet 6   No current facility-administered medications for this visit.   Allergies  Allergen Reactions   Pholcodine     Other reaction(s): wheezing   Codeine    Medical Provider Ez Flu Shot [Influenza Vac Split Quad] Other (See Comments)    Severe myalgias and bone pain   Quinolones     Aortic enlargement      Review of Systems: All systems reviewed and negative except where noted in HPI.   Physical Exam: BP 120/80   Pulse 84   Ht '5\' 3"'$  (1.6 m)   Wt 182 lb 12.8 oz (82.9 kg)   LMP 06/01/2019   BMI 32.38 kg/m  Constitutional: Pleasant,well-developed,  female in no acute distress. HEENT: Normocephalic and atraumatic. Conjunctivae are normal. No scleral icterus. Cardiovascular: Normal rate, regular rhythm.  Pulmonary/chest: Effort normal and breath sounds normal. No wheezing, rales or rhonchi. Abdominal: Soft, nondistended, tender in several areas of the abdomen. Bowel sounds active throughout. There are no masses palpable. No hepatomegaly. Extremities: No edema Neurological: Alert and oriented to person place and time. Skin: Skin is warm and dry. No rashes noted. Psychiatric: Normal mood and affect. Behavior is normal.  Labs 11/2020: CBC and CMP unremarkable  Labs 03/2021: TSH nml. BMP nml.   CT A/P  w/contrast 09/15/21: IMPRESSION: 1. No acute intra-abdominal or intrapelvic process. 2. Hepatic steatosis. 3.  Aortic Atherosclerosis (ICD10-I70.0).  EGD 01/24/18: - Normal esophagus. Biopsied. - Erythematous mucosa in the antrum. Biopsied. - Normal duodenal bulb, second portion of the duodenum and examined duodenum. - Biopsies were obtained in the gastric body, at the incisura and in the gastric antrum. Path: A.  STOMACH, ERYTHEMA, RULE OUT H. PYLORI; COLD BIOPSY:  - ANTRAL AND OXYNTIC MUCOSA WITH MILD CHRONIC GASTRITIS, REACTIVE  FOVEOLAR HYPERPLASIA, AND FOCAL INTESTINAL METAPLASIA.  - NEGATIVE FOR H. PYLORI, DYSPLASIA, OR MALIGNANCY.  B.  ESOPHAGUS, DISTAL, RULE OUT EOE; COLD BIOPSY:  - BENIGN SQUAMOUS MUCOSA WITH CHANGES CONSISTENT WITH REFLUX  ESOPHAGITIS.  - NEGATIVE FOR EOSINOPHILS, DYSPLASIA, OR MALIGNANCY.  C.  ESOPHAGUS, PROXIMAL, RULE OUT EOE; COLD BIOPSY:  - BENIGN SQUAMOUS MUCOSA WITH CHANGES CONSISTENT WITH REFLUX  ESOPHAGITIS.  - NEGATIVE FOR EOSINOPHILS, DYSPLASIA, OR MALIGNANCY.   EGD 09/09/20: - White nummular lesions in esophageal mucosa. Brushings performed. - Esophageal mucosal changes suggestive of eosinophilic esophagitis. Biopsied. - Erythematous mucosa in the antrum. Biopsied. - Normal duodenal bulb,  second portion of the duodenum and examined duodenum. - Biopsies were obtained in the gastric body, at the incisura and in the gastric antrum. Path: A. STOMACH; COLD BIOPSY:  - ANTRAL MUCOSA WITH CHRONIC INFLAMMATION AND INTESTINAL METAPLASIA.  - NEGATIVE FOR ACTIVE INFLAMMATION AND H PYLORI.  - NEGATIVE FOR DYSPLASIA AND MALIGNANCY.  - UNREMARKABLE OXYNTIC MUCOSA.  B. ESOPHAGUS; COLD BIOPSY:  - SQUAMOUS MUCOSA WITH FOCAL MILD ACTIVE INFLAMMATION.  - NEGATIVE FOR INCREASED EOSINOPHILS.  - NEGATIVE FOR VIRAL CYTOPATHIC EFFECT AND FUNGAL ORGANISMS.  - NEGATIVE FOR INTESTINAL METAPLASIA, DYSPLASIA, AND MALIGNANCY.  Comment:  GMS stain is negative for fungal organisms.  Controls stain  appropriately.   EGD 02/17/21: - Normal esophagus. - Erythematous mucosa in the antrum. Biopsied. - Normal duodenal bulb, second portion of the duodenum and examined duodenum. - Biopsies were obtained in the gastric body, at the incisura and in the gastric antrum. Path: DIAGNOSIS:  A. STOMACH, ANTRUM GREATER CURVATURE; COLD BIOPSY:  - ANTRAL MUCOSA WITH REACTIVE GASTRITIS AND PATCHY INTESTINAL METAPLASIA.  - NEGATIVE FOR H. PYLORI, DYSPLASIA, AND MALIGNANCY.  B.  STOMACH, ANTRUM LESSER CURVATURE; COLD BIOPSY:  - ANTRAL MUCOSA WITH REACTIVE GASTRITIS AND PATCHY INTESTINAL METAPLASIA.  - NEGATIVE FOR H. PYLORI, DYSPLASIA, AND MALIGNANCY.  C.  STOMACH, INCISURA; COLD BIOPSY:  - ANTRAL MUCOSA WITH MILD CHRONIC GASTRITIS AND PANCREATIC ACINAR METAPLASIA.  - NEGATIVE FOR H. PYLORI, INTESTINAL METAPLASIA, DYSPLASIA, AND MALIGNANCY.  D.  STOMACH, BODY GREATER CURVATURE; COLD BIOPSY:  - OXYNTIC MUCOSA WITH FOCAL MINIMAL CHRONIC GASTRITIS (MULTIPLE FRAGMENTS).  - SEPARATE SINGLE FRAGMENT OF POORLY PRESERVED ANTRAL MUCOSA, FAVOR  INADVERTENT PLACEMENT OF ANTRAL BIOPSY IN "STOMACH BODY " SPECIMEN CONTAINER.  - NEGATIVE FOR H. PYLORI, INTESTINAL METAPLASIA, DYSPLASIA, AND MALIGNANCY.  E.  STOMACH, BODY LESSER  CURVATURE; COLD BIOPSY:  - UNREMARKABLE OXYNTIC MUCOSA.  - NEGATIVE FOR H. PYLORI, INTESTINAL METAPLASIA, DYSPLASIA, AND MALIGNANCY.  Comment:  The above gastric biopsies demonstrate patchy intestinal metaplasia that is limited to the antrum. These findings raise concern for early environmental metaplastic atrophic gastritis (EMAG). While H.pylori is the most common cause of EMAG other causes include bile reflux, NSAID use, smoking, and alcohol use.   ASSESSMENT AND PLAN: Lower abdominal pain Diarrhea GERD Dysphagia Nausea Gastric intestinal metaplasia Family history of stomach cancer Hepatic steatosis Patient presents with lower abdominal pain, dysphagia, and diarrhea over the last several  months. At this time it is possible that the patient's lower abdominal pain may be due to constipation. Review of her most recent CT A/P shows that she does have substantial stool burden so her diarrhea may be due to overflow diarrhea. As a trial, will have her undergo a colonoscopy procedure that will require a colon preparation beforehand to see if she sees any improvement in her abdominal pain. Patient did decline to take a laxative because she does not like to take medications. Patient also describes dysphagia that started after her last EGD was done. Prior EGDs have shown that she has signs of inflammation in the esophagus that are suggestive of acid reflux. Can also perform surveillance biopsies of her stomach at that time since patient has history of GIM. - Check CBC, CMP, lipase, CRP, TTG IgA, IgA, TSH - Patient declined laxative - EGD/colonoscopy [facility]  [name], MD  I spent 65 minutes of time, including in depth chart review, independent review of results as outlined above, communicating results with the patient directly, face-to-face time with the patient, coordinating care, ordering studies and medications as appropriate, and documentation.

## 2021-11-06 LAB — TISSUE TRANSGLUTAMINASE ABS,IGG,IGA
(tTG) Ab, IgA: <1 U/mL
(tTG) Ab, IgG: <1 U/mL

## 2021-11-06 LAB — IGA: Immunoglobulin A: 106 mg/dL (ref 47–310)

## 2021-11-07 LAB — TSH: TSH: 0.65 u[IU]/mL (ref 0.35–5.50)

## 2021-11-10 ENCOUNTER — Encounter: Payer: Self-pay | Admitting: Family Medicine

## 2021-11-10 ENCOUNTER — Ambulatory Visit (INDEPENDENT_AMBULATORY_CARE_PROVIDER_SITE_OTHER): Payer: 59 | Admitting: Family Medicine

## 2021-11-10 VITALS — BP 116/79 | HR 81 | Temp 98.8°F | Wt 184.1 lb

## 2021-11-10 DIAGNOSIS — Z0000 Encounter for general adult medical examination without abnormal findings: Secondary | ICD-10-CM | POA: Diagnosis not present

## 2021-11-10 LAB — URINALYSIS, ROUTINE W REFLEX MICROSCOPIC
Bilirubin, UA: NEGATIVE
Glucose, UA: NEGATIVE
Leukocytes,UA: NEGATIVE
Nitrite, UA: NEGATIVE
RBC, UA: NEGATIVE
Specific Gravity, UA: 1.015 (ref 1.005–1.030)
Urobilinogen, Ur: 1 mg/dL (ref 0.2–1.0)
pH, UA: 7.5 (ref 5.0–7.5)

## 2021-11-10 LAB — MICROSCOPIC EXAMINATION
Bacteria, UA: NONE SEEN
WBC, UA: NONE SEEN /hpf (ref 0–5)

## 2021-11-10 NOTE — Progress Notes (Signed)
BP 116/79   Pulse 81   Temp 98.8 F (37.1 C)   Wt 184 lb 1.6 oz (83.5 kg)   LMP 06/01/2019   SpO2 97%   BMI 32.61 kg/m    Subjective:    Patient ID: Brittany Ochoa, adult    DOB: 10/05/1965, 56 y.o.   MRN: [id number]  HPI: Brittany Ochoa is a 56 y.o. adult presenting on 11/10/2021 for comprehensive medical examination. Current medical complaints include:none  She currently lives with: alone Menopausal Symptoms: yes  Depression Screen done today and results listed below:     04/01/2021    8:34 AM 10/08/2020    3:10 PM 02/13/2020    4:18 PM 01/14/2019   11:00 AM 10/04/2018    4:09 PM  Depression screen PHQ 2/9  Decreased Interest 1 0 _0 Down, Depressed, Hopeless 2 0 _1 PHQ - 2 Score 3 0 _2 Altered sleeping 3  3 0 2  Tired, decreased energy _3 Change in appetite 2  0 3 3  Feeling bad or failure about yourself  0  0 0 3  Trouble concentrating 0  0 0 1  Moving slowly or fidgety/restless 0  1 0 0  Suicidal thoughts 0  0 1   PHQ-9 Score _4 Difficult doing work/chores   Not difficult at all Not difficult at all     Past Medical History:  Past Medical History:  Diagnosis Date   Allergy 2015   seasonal   Arthritis    Ascending aortic aneurysm ([place])    a. 06/2018 CTA Chest: 4.1 x 4.0 cm fusiform Asc Ao aneurysm-unchanged.  Descending thoracic aorta 2.1 cm.   Asthma    exercised induced   Diabetes ([place])    Dyspnea on exertion    a. 06/2017 echo: EF 60 to 65%.  No regional wall motion abnormalities.  Ascending aortic diameter 3.9 cm.  Mild MR.   Essential hypertension    Lipoma    of neck and chest   Multiple thyroid nodules    OCD (obsessive compulsive disorder)    Reflux esophagitis     Surgical History:  Past Surgical History:  Procedure Laterality Date   COSMETIC SURGERY  1997   breast reduction   ESOPHAGOGASTRODUODENOSCOPY (EGD) WITH PROPOFOL N/A 01/24/2018   Procedure: ESOPHAGOGASTRODUODENOSCOPY (EGD) WITH PROPOFOL;  Surgeon:  [name], MD;  Location: [facility];  Service: Endoscopy;  Laterality: N/A;   ESOPHAGOGASTRODUODENOSCOPY (EGD) WITH PROPOFOL N/A 09/09/2020   Procedure: ESOPHAGOGASTRODUODENOSCOPY (EGD) WITH PROPOFOL;  Surgeon: [name], MD;  Location: [facility];  Service: Endoscopy;  Laterality: N/A;   ESOPHAGOGASTRODUODENOSCOPY (EGD) WITH PROPOFOL N/A 02/17/2021   Procedure: ESOPHAGOGASTRODUODENOSCOPY (EGD) WITH PROPOFOL;  Surgeon: [name], MD;  Location: [facility] END[facility]COPY;  Service: Endoscopy;  Laterality: N/A;   lipoma removal  2007   back   REDUCTION MAMMAPLASTY Bilateral 12/01/1996    Medications:  Current Outpatient Medications on File Prior to Visit  Medication Sig   estradiol (ESTRACE) 0.1 MG/GM vaginal cream Estrogen Cream Instruction  Discard applicator  Apply pea sized amount to tip of finger to urethra before bed. Wash hands well after application. Use Monday, Wednesday and Friday   metFORMIN (GLUCOPHAGE-XR) 500 MG 24 hr tablet TAKE 1 TABLET BY MOUTH TWICE DAILY (Patient taking differently: Take 500 mg by mouth 2 (two) times daily. If eating 1 meal daily, will take only  1 pill. If eating 2 meals daily, will take twice daily)   No current facility-administered medications on file prior to visit.    Allergies:  Allergies  Allergen Reactions   Pholcodine     Other reaction(s): wheezing   Codeine    Medical Provider Ez Flu Shot [Influenza Vac Split Quad] Other (See Comments)    Severe myalgias and bone pain   Quinolones     Aortic enlargement     Social History:  Social History   Socioeconomic History   Marital status: Single    Spouse name: Not on file   Number of children: Not on file   Years of education: Not on file   Highest education level: Not on file  Occupational History   Not on file  Tobacco Use   Smoking status: Former    Packs/day: 2.00    Years: 25.00    Total pack years: 50.00    Types: Cigarettes    Quit date:  04/17/2004    Years since quitting: 17.5   Smokeless tobacco: Never  Vaping Use   Vaping Use: Never used  Substance and Sexual Activity   Alcohol use: Not Currently   Drug use: No    Comment: past only   Sexual activity: Not Currently  Other Topics Concern   Not on file  Social History Narrative   Not on file   Social Determinants of Health   Financial Resource Strain: Not on file  Food Insecurity: Not on file  Transportation Needs: Not on file  Physical Activity: Not on file  Stress: Not on file  Social Connections: Not on file  Intimate Partner Violence: Not on file   Social History   Tobacco Use  Smoking Status Former   Packs/day: 2.00   Years: 25.00   Total pack years: 50.00   Types: Cigarettes   Quit date: 04/17/2004   Years since quitting: 17.5  Smokeless Tobacco Never   Social History   Substance and Sexual Activity  Alcohol Use Not Currently    Family History:  Family History  Problem Relation Age of Onset   Diabetes Mother    Arthritis Mother    Obesity Mother    Diabetes Father    Heart disease Father    Heart attack Father    Stomach cancer Father 59   Asthma Brother    Anxiety disorder Brother    Osteoporosis Maternal Grandmother    Arthritis Maternal Grandmother    Varicose Veins Maternal Grandmother    Heart disease Paternal Grandmother    Stomach cancer Paternal Grandmother    Breast cancer Paternal Grandmother    Cancer Paternal Grandfather        spleen   Heart disease Paternal Grandfather    Alcohol abuse Maternal Uncle    Cancer Paternal Aunt        rare blood cancer; non genetic   Diabetes Paternal Aunt    Cancer Paternal Uncle        rare blood cancer; non genetic    Past medical history, surgical history, medications, allergies, family history and social history reviewed with patient today and changes made to appropriate areas of the chart.   Review of Systems  Constitutional: Negative.   HENT: Negative.    Eyes: Negative.    Respiratory: Negative.    Cardiovascular: Negative.   Gastrointestinal:  Positive for constipation and diarrhea. Negative for abdominal pain, blood in stool, heartburn, melena, nausea and vomiting.  Genitourinary: Negative.   Musculoskeletal:  Negative.   Skin: Negative.   Neurological: Negative.   Endo/Heme/Allergies: Negative.   Psychiatric/Behavioral: Negative.     All other ROS negative except what is listed above and in the HPI.      Objective:    BP 116/79   Pulse 81   Temp 98.8 F (37.1 C)   Wt 184 lb 1.6 oz (83.5 kg)   LMP 06/01/2019   SpO2 97%   BMI 32.61 kg/m   Wt Readings from Last 3 Encounters:  11/10/21 184 lb 1.6 oz (83.5 kg)  11/04/21 182 lb 12.8 oz (82.9 kg)  10/19/21 187 lb (84.8 kg)    Physical Exam Vitals and nursing note reviewed.  Constitutional:      General: She is not in acute distress.    Appearance: Normal appearance. She is obese. She is not ill-appearing, toxic-appearing or diaphoretic.  HENT:     Head: Normocephalic and atraumatic.     Right Ear: Tympanic membrane, ear canal and external ear normal. There is no impacted cerumen.     Left Ear: Tympanic membrane, ear canal and external ear normal. There is no impacted cerumen.     Nose: Nose normal. No congestion or rhinorrhea.     Mouth/Throat:     Mouth: Mucous membranes are moist.     Pharynx: Oropharynx is clear. No oropharyngeal exudate or posterior oropharyngeal erythema.  Eyes:     General: No scleral icterus.       Right eye: No discharge.        Left eye: No discharge.     Extraocular Movements: Extraocular movements intact.     Conjunctiva/sclera: Conjunctivae normal.     Pupils: Pupils are equal, round, and reactive to light.  Neck:     Vascular: No carotid bruit.  Cardiovascular:     Rate and Rhythm: Normal rate and regular rhythm.     Pulses: Normal pulses.     Heart sounds: No murmur heard.    No friction rub. No gallop.  Pulmonary:     Effort: Pulmonary effort is  normal. No respiratory distress.     Breath sounds: Normal breath sounds. No stridor. No wheezing, rhonchi or rales.  Chest:     Chest wall: No tenderness.  Abdominal:     General: Abdomen is flat. Bowel sounds are normal. There is no distension.     Palpations: Abdomen is soft. There is no mass.     Tenderness: There is no abdominal tenderness. There is no right CVA tenderness, left CVA tenderness, guarding or rebound.     Hernia: No hernia is present.  Genitourinary:    Comments: Breast and pelvic exams deferred with shared decision making Musculoskeletal:        General: No swelling, tenderness, deformity or signs of injury.     Cervical back: Normal range of motion and neck supple. No rigidity. No muscular tenderness.     Right lower leg: No edema.     Left lower leg: No edema.  Lymphadenopathy:     Cervical: No cervical adenopathy.  Skin:    General: Skin is warm and dry.     Capillary Refill: Capillary refill takes less than 2 seconds.     Coloration: Skin is not jaundiced or pale.     Findings: No bruising, erythema, lesion or rash.  Neurological:     General: No focal deficit present.     Mental Status: She is alert and oriented to person, place, and time. Mental status is  at baseline.     Cranial Nerves: No cranial nerve deficit.     Sensory: No sensory deficit.     Motor: No weakness.     Coordination: Coordination normal.     Gait: Gait normal.     Deep Tendon Reflexes: Reflexes normal.  Psychiatric:        Mood and Affect: Mood normal.        Behavior: Behavior normal.        Thought Content: Thought content normal.        Judgment: Judgment normal.     Results for orders placed or performed in visit on 11/04/21  IgA  Result Value Ref Range   Immunoglobulin A 106 47 - 310 mg/dL  Tissue Transglutaminase Abs,IgG,IgA  Result Value Ref Range   (tTG) Ab, IgG <1.0 U/mL   (tTG) Ab, IgA <1.0 U/mL  CBC w/Diff  Result Value Ref Range   WBC 6.3 4.0 - 10.5 K/uL    RBC 4.64 3.87 - 5.11 Mil/uL   Hemoglobin 14.0 12.0 - 15.0 g/dL   HCT 41.4 36.0 - 46.0 %   MCV 89.4 78.0 - 100.0 fl   MCHC 33.8 30.0 - 36.0 g/dL   RDW 13.2 11.5 - 15.5 %   Platelets 257.0 150.0 - 400.0 K/uL   Neutrophils Relative % 62.7 43.0 - 77.0 %   Lymphocytes Relative 29.0 12.0 - 46.0 %   Monocytes Relative 6.2 3.0 - 12.0 %   Eosinophils Relative 1.4 0.0 - 5.0 %   Basophils Relative 0.7 0.0 - 3.0 %   Neutro Abs 4.0 1.4 - 7.7 K/uL   Lymphs Abs 1.8 0.7 - 4.0 K/uL   Monocytes Absolute 0.4 0.1 - 1.0 K/uL   Eosinophils Absolute 0.1 0.0 - 0.7 K/uL   Basophils Absolute 0.0 0.0 - 0.1 K/uL  Comp Met (CMET)  Result Value Ref Range   Sodium 140 135 - 145 mEq/L   Potassium 3.9 3.5 - 5.1 mEq/L   Chloride 99 96 - 112 mEq/L   CO2 30 19 - 32 mEq/L   Glucose, Bld 91 70 - 99 mg/dL   BUN 15 6 - 23 mg/dL   Creatinine, Ser 0.69 0.40 - 1.20 mg/dL   Total Bilirubin 0.8 0.2 - 1.2 mg/dL   Alkaline Phosphatase 75 39 - 117 U/L   AST 27 0 - 37 U/L   ALT 31 0 - 35 U/L   Total Protein 7.5 6.0 - 8.3 g/dL   Albumin 5.0 3.5 - 5.2 g/dL   GFR 98.53 >60.00 mL/min   Calcium 9.8 8.4 - 10.5 mg/dL  C-reactive protein  Result Value Ref Range   CRP <1.0 0.5 - 20.0 mg/dL  Lipase  Result Value Ref Range   Lipase 42.0 11.0 - 59.0 U/L  TSH  Result Value Ref Range   TSH 0.65 0.35 - 5.50 uIU/mL      Assessment & Plan:   Problem List Items Addressed This Visit   None Visit Diagnoses     Routine general medical examination at a health care facility    -  Primary   Vaccines up to date/declined. Screening labs checked today. Pap, mammo and cologuard up to date. Continue diet and exercise. Call with any concerns.    Relevant Orders   Lipid Panel w/o Chol/HDL Ratio   Urinalysis, Routine w reflex microscopic        Follow up plan: Return in about 6 months (around 05/13/2022).   LABORATORY TESTING:  - Pap smear:  up to date  IMMUNIZATIONS:   - Tdap: Tetanus vaccination status reviewed: last tetanus  booster within 10 years. - Influenza:  Allergic - Pneumovax: Not applicable - Prevnar: Not applicable - COVID: Refused - HPV: Not applicable - Shingrix vaccine: Refused  SCREENING: -Mammogram: Up to date  - Colonoscopy:  scheduled    PATIENT COUNSELING:   Advised to take 1 mg of folate supplement per day if capable of pregnancy.   Sexuality: Discussed sexually transmitted diseases, partner selection, use of condoms, avoidance of unintended pregnancy  and contraceptive alternatives.   Advised to avoid cigarette smoking.  I discussed with the patient that most people either abstain from alcohol or drink within safe limits (<=14/week and <=4 drinks/occasion for males, <=7/weeks and <= 3 drinks/occasion for females) and that the risk for alcohol disorders and other health effects rises proportionally with the number of drinks per week and how often a drinker exceeds daily limits.  Discussed cessation/primary prevention of drug use and availability of treatment for abuse.   Diet: Encouraged to adjust caloric intake to maintain  or achieve ideal body weight, to reduce intake of dietary saturated fat and total fat, to limit sodium intake by avoiding high sodium foods and not adding table salt, and to maintain adequate dietary potassium and calcium preferably from fresh fruits, vegetables, and low-fat dairy products.    stressed the importance of regular exercise  Injury prevention: Discussed safety belts, safety helmets, smoke detector, smoking near bedding or upholstery.   Dental health: Discussed importance of regular tooth brushing, flossing, and dental visits.    NEXT PREVENTATIVE PHYSICAL DUE IN 1 YEAR. Return in about 6 months (around 05/13/2022).

## 2021-11-11 LAB — LIPID PANEL W/O CHOL/HDL RATIO
Cholesterol, Total: 180 mg/dL (ref 100–199)
HDL: 42 mg/dL (ref >39)
LDL Chol Calc (NIH): 107 mg/dL — ABNORMAL HIGH (ref 0–99)
Triglycerides: 178 mg/dL — ABNORMAL HIGH (ref 0–149)
VLDL Cholesterol Cal: 31 mg/dL (ref 5–40)

## 2021-11-16 ENCOUNTER — Other Ambulatory Visit: Payer: Self-pay

## 2021-11-17 ENCOUNTER — Other Ambulatory Visit: Payer: Self-pay

## 2021-11-17 ENCOUNTER — Encounter: Payer: Self-pay | Admitting: Internal Medicine

## 2021-11-17 MED ORDER — DICYCLOMINE HCL 10 MG PO CAPS
10.0000 mg | ORAL_CAPSULE | Freq: Three times a day (TID) | ORAL | 0 refills | Status: DC
  Filled 2021-11-17: qty 90, 23d supply, fill #0

## 2021-12-06 ENCOUNTER — Encounter: Payer: Self-pay | Admitting: Internal Medicine

## 2021-12-08 ENCOUNTER — Other Ambulatory Visit: Payer: Self-pay

## 2021-12-09 ENCOUNTER — Encounter: Payer: Self-pay | Admitting: Internal Medicine

## 2021-12-11 ENCOUNTER — Other Ambulatory Visit: Payer: Self-pay

## 2021-12-12 ENCOUNTER — Other Ambulatory Visit: Payer: Self-pay

## 2021-12-14 ENCOUNTER — Telehealth: Payer: Self-pay | Admitting: Internal Medicine

## 2021-12-14 NOTE — Telephone Encounter (Signed)
Returned patient call, clarified prep instructions .

## 2021-12-14 NOTE — Telephone Encounter (Signed)
Inbound call from patient requesting a call back to speak to some one about prep. Please give a call back to further advise.  Thank you

## 2021-12-16 ENCOUNTER — Other Ambulatory Visit: Payer: Self-pay

## 2021-12-16 ENCOUNTER — Encounter: Payer: Self-pay | Admitting: Internal Medicine

## 2021-12-16 ENCOUNTER — Ambulatory Visit (AMBULATORY_SURGERY_CENTER): Payer: 59 | Admitting: Internal Medicine

## 2021-12-16 VITALS — BP 134/79 | HR 60 | Temp 97.1°F | Resp 10 | Ht 63.0 in | Wt 182.0 lb

## 2021-12-16 DIAGNOSIS — K2970 Gastritis, unspecified, without bleeding: Secondary | ICD-10-CM | POA: Diagnosis not present

## 2021-12-16 DIAGNOSIS — K219 Gastro-esophageal reflux disease without esophagitis: Secondary | ICD-10-CM | POA: Diagnosis not present

## 2021-12-16 DIAGNOSIS — R1310 Dysphagia, unspecified: Secondary | ICD-10-CM | POA: Diagnosis not present

## 2021-12-16 DIAGNOSIS — R197 Diarrhea, unspecified: Secondary | ICD-10-CM | POA: Diagnosis not present

## 2021-12-16 DIAGNOSIS — K319 Disease of stomach and duodenum, unspecified: Secondary | ICD-10-CM | POA: Diagnosis not present

## 2021-12-16 DIAGNOSIS — R1030 Lower abdominal pain, unspecified: Secondary | ICD-10-CM | POA: Diagnosis not present

## 2021-12-16 DIAGNOSIS — K2100 Gastro-esophageal reflux disease with esophagitis, without bleeding: Secondary | ICD-10-CM | POA: Diagnosis not present

## 2021-12-16 DIAGNOSIS — D122 Benign neoplasm of ascending colon: Secondary | ICD-10-CM

## 2021-12-16 DIAGNOSIS — Z800 Family history of malignant neoplasm of digestive organs: Secondary | ICD-10-CM | POA: Diagnosis not present

## 2021-12-16 HISTORY — PX: OTHER SURGICAL HISTORY: SHX169

## 2021-12-16 MED ORDER — SODIUM CHLORIDE 0.9 % IV SOLN: 500.0000 mL | Freq: Once | INTRAVENOUS | Status: DC

## 2021-12-16 MED ORDER — OMEPRAZOLE 40 MG PO CPDR
40.0000 mg | DELAYED_RELEASE_CAPSULE | Freq: Two times a day (BID) | ORAL | 1 refills | Status: DC
  Filled 2021-12-16: qty 60, 30d supply, fill #0

## 2021-12-16 NOTE — Op Note (Signed)
[place] Patient Name: Brittany Ochoa Procedure Date: 12/16/2021 1:23 PM MRN: [id number] Endoscopist: Sonny Masters "Brittany Ochoa ,  Age: 56 Referring MD:  Date of Birth: 1965/08/12 Gender: Female Account #: [id number] Procedure:                Upper GI endoscopy Indications:              Dysphagia, Heartburn, Follow-up of intestinal                            metaplasia, Diarrhea Medicines:                Monitored Anesthesia Care Procedure:                Pre-Anesthesia Assessment:                           - Prior to the procedure, a History and Physical                            was performed, and patient medications and                            allergies were reviewed. The patient's tolerance of                            previous anesthesia was also reviewed. The risks                            and benefits of the procedure and the sedation                            options and risks were discussed with the patient.                            All questions were answered, and informed consent                            was obtained. Prior Anticoagulants: The patient has                            taken no previous anticoagulant or antiplatelet                            agents. ASA Grade Assessment: II - A patient with                            mild systemic disease. After reviewing the risks                            and benefits, the patient was deemed in                            satisfactory condition to undergo the procedure.  After obtaining informed consent, the endoscope was                            passed under direct vision. Throughout the                            procedure, the patient's blood pressure, pulse, and                            oxygen saturations were monitored continuously. The                            GIF [id number] #[id number] was introduced through the                            mouth, and advanced to the second part  of duodenum.                            The upper GI endoscopy was accomplished without                            difficulty. The patient tolerated the procedure                            well. Scope In: Scope Out: Findings:                 LA Grade A (one or more mucosal breaks less than 5                            mm, not extending between tops of 2 mucosal folds)                            esophagitis with no bleeding was found in the                            distal esophagus.                           Localized inflammation characterized by congestion                            (edema), erosions and erythema was found in the                            gastric antrum. Biopsies were taken with a cold                            forceps for histology.                           The examined duodenum was normal. Biopsies were                            taken with a cold  forceps for histology. Complications:            No immediate complications. Estimated Blood Loss:     Estimated blood loss was minimal. Impression:               - LA Grade A reflux esophagitis with no bleeding.                           - Gastritis. Biopsied.                           - Normal examined duodenum. Biopsied. Recommendation:           - Use Prilosec (omeprazole) 40 mg PO BID for 8                            weeks.                           - Await pathology results.                           - Perform a colonoscopy today. Dr Brittany Ochoa "Brittany Ochoa" Brittany Ochoa,  12/16/2021 2:18:51 PM

## 2021-12-16 NOTE — Progress Notes (Signed)
GASTROENTEROLOGY PROCEDURE H&P NOTE   Primary Care Physician: [name], DO    Reason for Procedure:   Lower ab pain, diarrhea, dysphagia, GERD, gastric intestinal metaplasia, family history of stomach cancer  Plan:    EGD/colonoscopy  Patient is appropriate for endoscopic procedure(s) in the ambulatory ([place]) setting.  The nature of the procedure, as well as the risks, benefits, and alternatives were carefully and thoroughly reviewed with the patient. Ample time for discussion and questions allowed. The patient understood, was satisfied, and agreed to proceed.     HPI: Brittany Ochoa is a 56 y.o. adult who presents for EGD/colonoscopy for evaluation of lower ab pain, diarrhea, dysphagia, GERD, gastric intestinal metaplasia, family history of stomach cancer.  Patient was most recently seen in the Gastroenterology Clinic on 11/04/21.  No interval change in medical history since that appointment. Please refer to that note for full details regarding GI history and clinical presentation.   Past Medical History:  Diagnosis Date   Allergy 2015   seasonal   Arthritis    Ascending aortic aneurysm ([place])    a. 06/2018 CTA Chest: 4.1 x 4.0 cm fusiform Asc Ao aneurysm-unchanged.  Descending thoracic aorta 2.1 cm.   Asthma    exercised induced   Diabetes ([place])    Dyspnea on exertion    a. 06/2017 echo: EF 60 to 65%.  No regional wall motion abnormalities.  Ascending aortic diameter 3.9 cm.  Mild MR.   Essential hypertension    Lipoma    of neck and chest   Multiple thyroid nodules    OCD (obsessive compulsive disorder)    Reflux esophagitis     Past Surgical History:  Procedure Laterality Date   COSMETIC SURGERY  1997   breast reduction   ESOPHAGOGASTRODUODENOSCOPY (EGD) WITH PROPOFOL N/A 01/24/2018   Procedure: ESOPHAGOGASTRODUODENOSCOPY (EGD) WITH PROPOFOL;  Surgeon: [name], MD;  Location: [facility];  Service: Endoscopy;  Laterality: N/A;    ESOPHAGOGASTRODUODENOSCOPY (EGD) WITH PROPOFOL N/A 09/09/2020   Procedure: ESOPHAGOGASTRODUODENOSCOPY (EGD) WITH PROPOFOL;  Surgeon: [name], MD;  Location: [facility];  Service: Endoscopy;  Laterality: N/A;   ESOPHAGOGASTRODUODENOSCOPY (EGD) WITH PROPOFOL N/A 02/17/2021   Procedure: ESOPHAGOGASTRODUODENOSCOPY (EGD) WITH PROPOFOL;  Surgeon: [name], MD;  Location: [facility] END[facility]COPY;  Service: Endoscopy;  Laterality: N/A;   lipoma removal  2007   back   REDUCTION MAMMAPLASTY Bilateral 12/01/1996    Prior to Admission medications   Medication Sig Start Date End Date Taking? Authorizing Provider  estradiol (ESTRACE) 0.1 MG/GM vaginal cream Estrogen Cream Instruction  Discard applicator  Apply pea sized amount to tip of finger to urethra before bed. Wash hands well after application. Use Monday, Wednesday and Friday 10/19/21  Yes Hollice Espy, MD  metFORMIN (GLUCOPHAGE-XR) 500 MG 24 hr tablet TAKE 1 TABLET BY MOUTH TWICE DAILY Patient taking differently: Take 500 mg by mouth 2 (two) times daily. If eating 1 meal daily, will take only 1 pill. If eating 2 meals daily, will take twice daily 09/26/21  Yes     Current Outpatient Medications  Medication Sig Dispense Refill   estradiol (ESTRACE) 0.1 MG/GM vaginal cream Estrogen Cream Instruction  Discard applicator  Apply pea sized amount to tip of finger to urethra before bed. Wash hands well after application. Use Monday, Wednesday and Friday 42.5 g 12   metFORMIN (GLUCOPHAGE-XR) 500 MG 24 hr tablet TAKE 1 TABLET BY MOUTH TWICE DAILY (Patient taking differently: Take 500 mg by mouth 2 (two)  times daily. If eating 1 meal daily, will take only 1 pill. If eating 2 meals daily, will take twice daily) 60 tablet 6   Current Facility-Administered Medications  Medication Dose Route Frequency Provider Last Rate Last Admin   0.9 %  sodium chloride infusion  500 mL Intravenous Once Sharyn Creamer, MD        Allergies as of  12/16/2021 - Review Complete 12/16/2021  Allergen Reaction Noted   Pholcodine  10/18/2021   Codeine  08/18/2015   Medical provider ez flu shot [influenza vac split quad] Other (See Comments) 05/27/2019   Quinolones  10/30/2019    Family History  Problem Relation Age of Onset   Diabetes Mother    Arthritis Mother    Obesity Mother    Diabetes Father    Heart disease Father    Heart attack Father    Stomach cancer Father 74   Asthma Brother    Anxiety disorder Brother    Osteoporosis Maternal Grandmother    Arthritis Maternal Grandmother    Varicose Veins Maternal Grandmother    Heart disease Paternal Grandmother    Stomach cancer Paternal Grandmother    Breast cancer Paternal Grandmother    Cancer Paternal Grandfather        spleen   Heart disease Paternal Grandfather    Alcohol abuse Maternal Uncle    Cancer Paternal Aunt        rare blood cancer; non genetic   Diabetes Paternal Aunt    Cancer Paternal Uncle        rare blood cancer; non genetic    Social History   Socioeconomic History   Marital status: Single    Spouse name: Not on file   Number of children: Not on file   Years of education: Not on file   Highest education level: Not on file  Occupational History   Not on file  Tobacco Use   Smoking status: Former    Packs/day: 2.00    Years: 25.00    Total pack years: 50.00    Types: Cigarettes    Quit date: 04/17/2004    Years since quitting: 17.6   Smokeless tobacco: Never  Vaping Use   Vaping Use: Never used  Substance and Sexual Activity   Alcohol use: Not Currently   Drug use: No    Comment: past only   Sexual activity: Not Currently  Other Topics Concern   Not on file  Social History Narrative   Not on file   Social Determinants of Health   Financial Resource Strain: Not on file  Food Insecurity: Not on file  Transportation Needs: Not on file  Physical Activity: Not on file  Stress: Not on file  Social Connections: Not on file   Intimate Partner Violence: Not on file    Physical Exam: Vital signs in last 24 hours: BP 130/86   Pulse 70   Temp (!) 97.1 F (36.2 C)   Ht '5\' 3"'$  (1.6 m)   Wt 182 lb (82.6 kg)   LMP 06/01/2019   SpO2 97%   BMI 32.24 kg/m  GEN: NAD EYE: Sclerae anicteric ENT: MMM CV: Non-tachycardic Pulm: No increased WOB GI: Soft NEURO:  Alert & Oriented   [name], MD [facility]   12/16/2021 1:23 PM

## 2021-12-16 NOTE — Progress Notes (Signed)
Sedate, gd SR, tolerated procedure well, VSS, report to RN 

## 2021-12-16 NOTE — Op Note (Signed)
[facility] Patient Name: Brittany Ochoa Procedure Date: 12/16/2021 1:22 PM MRN: [id number] Endoscopist: Sonny Masters "[name] ,  Age: 56 Referring MD:  Date of Birth: 24-Aug-1965 Gender: Female Account #: [id number] Procedure:                Colonoscopy Indications:              Diarrhea Medicines:                Monitored Anesthesia Care Procedure:                Pre-Anesthesia Assessment:                           - Prior to the procedure, a History and Physical                            was performed, and patient medications and                            allergies were reviewed. The patient's tolerance of                            previous anesthesia was also reviewed. The risks                            and benefits of the procedure and the sedation                            options and risks were discussed with the patient.                            All questions were answered, and informed consent                            was obtained. Prior Anticoagulants: The patient has                            taken no previous anticoagulant or antiplatelet                            agents. ASA Grade Assessment: II - A patient with                            mild systemic disease. After reviewing the risks                            and benefits, the patient was deemed in                            satisfactory condition to undergo the procedure.                           After obtaining informed consent, the colonoscope  was passed under direct vision. Throughout the                            procedure, the patient's blood pressure, pulse, and                            oxygen saturations were monitored continuously. The                            Colonoscope was introduced through the anus and                            advanced to the the terminal ileum. The colonoscopy                            was performed without difficulty. The patient                             tolerated the procedure well. The quality of the                            bowel preparation was excellent. The terminal                            ileum, ileocecal valve, appendiceal orifice, and                            rectum were photographed. Scope In: 1:54:12 PM Scope Out: 2:14:24 PM Scope Withdrawal Time: 0 hours 16 minutes 9 seconds  Total Procedure Duration: 0 hours 20 minutes 12 seconds  Findings:                 The terminal ileum appeared normal.                           Biopsies for histology were taken with a cold                            forceps from the entire colon for evaluation of                            microscopic colitis.                           A 3 mm polyp was found in the ascending colon. The                            polyp was sessile. The polyp was removed with a                            cold snare. Resection and retrieval were complete.                           A localized area of erythematous mucosa was found  in the rectum. This was biopsied with a cold                            forceps for histology. Complications:            No immediate complications. Estimated Blood Loss:     Estimated blood loss was minimal. Impression:               - The examined portion of the ileum was normal.                           - Biopsies were taken with a cold forceps from the                            entire colon for evaluation of microscopic colitis.                           - One 3 mm polyp in the ascending colon, removed                            with a cold snare. Resected and retrieved.                           - Erythematous mucosa in the rectum. Biopsied. Recommendation:           - Discharge patient to home (with escort).                           - Await pathology results.                           - The findings and recommendations were discussed                            with the patient.                            - Return to GI clinic in 6 weeks. Dr [name] "[name]" [name],  12/16/2021 2:21:45 PM

## 2021-12-16 NOTE — Patient Instructions (Signed)
Handouts provided about gastritis and polyps.  Use Prilosec 40 mg PO BID for 8 weeks.  Await pathology results.  Return to GI clinic in 6 weeks.    YOU HAD AN ENDOSCOPIC PROCEDURE TODAY AT [facility]:   Refer to the procedure report that was given to you for any specific questions about what was found during the examination.  If the procedure report does not answer your questions, please call your gastroenterologist to clarify.  If you requested that your care partner not be given the details of your procedure findings, then the procedure report has been included in a sealed envelope for you to review at your convenience later.  YOU SHOULD EXPECT: Some feelings of bloating in the abdomen. Passage of more gas than usual.  Walking can help get rid of the air that was put into your GI tract during the procedure and reduce the bloating. If you had a lower endoscopy (such as a colonoscopy or flexible sigmoidoscopy) you may notice spotting of blood in your stool or on the toilet paper. If you underwent a bowel prep for your procedure, you may not have a normal bowel movement for a few days.  Please Note:  You might notice some irritation and congestion in your nose or some drainage.  This is from the oxygen used during your procedure.  There is no need for concern and it should clear up in a day or so.  SYMPTOMS TO REPORT IMMEDIATELY:  Following lower endoscopy (colonoscopy or flexible sigmoidoscopy):  Excessive amounts of blood in the stool  Significant tenderness or worsening of abdominal pains  Swelling of the abdomen that is new, acute  Fever of 100F or higher  Following upper endoscopy (EGD)  Vomiting of blood or coffee ground material  New chest pain or pain under the shoulder blades  Painful or persistently difficult swallowing  New shortness of breath  Fever of 100F or higher  Black, tarry-looking stools  For urgent or emergent issues, a gastroenterologist can be  reached at any hour by calling [phone]. Do not use MyChart messaging for urgent concerns.    DIET:  We do recommend a small meal at first, but then you may proceed to your regular diet.  Drink plenty of fluids but you should avoid alcoholic beverages for 24 hours.  ACTIVITY:  You should plan to take it easy for the rest of today and you should NOT DRIVE or use heavy machinery until tomorrow (because of the sedation medicines used during the test).    FOLLOW UP: Our staff will call the number listed on your records the next business day following your procedure.  We will call around 7:15- 8:00 am to check on you and address any questions or concerns that you may have regarding the information given to you following your procedure. If we do not reach you, we will leave a message.  If you develop any symptoms (ie: fever, flu-like symptoms, shortness of breath, cough etc.) before then, please call [phone].  If you test positive for Covid 19 in the 2 weeks post procedure, please call and report this information to [place].    If any biopsies were taken you will be contacted by phone or by letter within the next 1-3 weeks.  Please call us at [phone] if you have not heard about the biopsies in 3 weeks.    SIGNATURES/CONFIDENTIALITY: You and/or your care partner have signed paperwork which will be entered into your electronic  medical record.  These signatures attest to the fact that that the information above on your After Visit Summary has been reviewed and is understood.  Full responsibility of the confidentiality of this discharge information lies with you and/or your care-partner.  

## 2021-12-16 NOTE — Progress Notes (Signed)
Called to room to assist during endoscopic procedure.  Patient ID and intended procedure confirmed with present staff. Received instructions for my participation in the procedure from the performing physician.  

## 2021-12-20 ENCOUNTER — Telehealth: Payer: Self-pay

## 2021-12-20 NOTE — Telephone Encounter (Signed)
  Follow up Call-     12/16/2021   12:56 PM  Call back number  Post procedure Call Back phone  # [phone]  Permission to leave phone message Yes     Patient questions:  Do you have a fever, pain , or abdominal swelling? No. Pain Score  0 *  Have you tolerated food without any problems? Yes.    Have you been able to return to your normal activities? Yes.    Do you have any questions about your discharge instructions: Diet   No. Medications  No. Follow up visit  No.  Do you have questions or concerns about your Care? No.  Actions: * If pain score is 4 or above: No action needed, pain <4.

## 2021-12-22 ENCOUNTER — Encounter: Payer: Self-pay | Admitting: Internal Medicine

## 2021-12-23 ENCOUNTER — Encounter: Payer: Self-pay | Admitting: Internal Medicine

## 2021-12-26 ENCOUNTER — Encounter: Payer: Self-pay | Admitting: Family Medicine

## 2022-01-09 ENCOUNTER — Other Ambulatory Visit: Payer: Self-pay

## 2022-01-09 NOTE — Telephone Encounter (Signed)
appt

## 2022-01-10 NOTE — Telephone Encounter (Signed)
Called patient to schedule an appointment. Patient states she will call back

## 2022-01-20 ENCOUNTER — Ambulatory Visit: Payer: 59 | Admitting: Family Medicine

## 2022-01-25 ENCOUNTER — Ambulatory Visit: Payer: 59 | Admitting: Urology

## 2022-01-25 DIAGNOSIS — N390 Urinary tract infection, site not specified: Secondary | ICD-10-CM

## 2022-02-13 ENCOUNTER — Other Ambulatory Visit: Payer: Self-pay

## 2022-02-15 ENCOUNTER — Ambulatory Visit (INDEPENDENT_AMBULATORY_CARE_PROVIDER_SITE_OTHER): Payer: 59 | Admitting: Physician Assistant

## 2022-02-15 ENCOUNTER — Other Ambulatory Visit: Payer: Self-pay

## 2022-02-15 ENCOUNTER — Encounter: Payer: Self-pay | Admitting: Physician Assistant

## 2022-02-15 VITALS — BP 140/95 | HR 82 | Ht 63.0 in | Wt 183.0 lb

## 2022-02-15 DIAGNOSIS — N390 Urinary tract infection, site not specified: Secondary | ICD-10-CM | POA: Diagnosis not present

## 2022-02-15 DIAGNOSIS — R300 Dysuria: Secondary | ICD-10-CM

## 2022-02-15 LAB — URINALYSIS, COMPLETE
Bilirubin, UA: NEGATIVE
Glucose, UA: NEGATIVE
Ketones, UA: NEGATIVE
Nitrite, UA: NEGATIVE
Specific Gravity, UA: 1.01 (ref 1.005–1.030)
Urobilinogen, Ur: 0.2 mg/dL (ref 0.2–1.0)
pH, UA: 5.5 (ref 5.0–7.5)

## 2022-02-15 LAB — MICROSCOPIC EXAMINATION: WBC, UA: >30 /hpf — AB (ref 0–5)

## 2022-02-15 MED ORDER — NITROFURANTOIN MONOHYD MACRO 100 MG PO CAPS
100.0000 mg | ORAL_CAPSULE | Freq: Two times a day (BID) | ORAL | 0 refills | Status: AC
  Filled 2022-02-15: qty 10, 5d supply, fill #0

## 2022-02-15 NOTE — Progress Notes (Signed)
02/15/2022 4:57 PM   Brittany Ochoa 24-Jan-1966 [id number]  CC: Chief Complaint  Patient presents with   Recurrent UTI   HPI: Brittany Ochoa is a 56 y.o. female with history of urgency/frequency/pelvic pain on topical vaginal estrogen cream who presents today for evaluation of possible UTI.   Today she reports a 1 day history of frequency, urgency, and bladder discomfort.  She has noticed some pink urine on her toilet paper after wiping.  She has noticed some malodorous urine x2 weeks.  She denies fever, chills, nausea, vomiting, and flank pain.  In-office UA today positive for 3+ blood, 2+ protein, and 3+ leukocytes; urine microscopy with >30 WBCs/HPF, 3-10 RBCs/HPF, and many bacteria.   PMH: Past Medical History:  Diagnosis Date   Allergy 2015   seasonal   Arthritis    Ascending aortic aneurysm ([place])    a. 06/2018 CTA Chest: 4.1 x 4.0 cm fusiform Asc Ao aneurysm-unchanged.  Descending thoracic aorta 2.1 cm.   Asthma    exercised induced   Diabetes ([place])    Dyspnea on exertion    a. 06/2017 echo: EF 60 to 65%.  No regional wall motion abnormalities.  Ascending aortic diameter 3.9 cm.  Mild MR.   Essential hypertension    Lipoma    of neck and chest   Multiple thyroid nodules    OCD (obsessive compulsive disorder)    Reflux esophagitis     Surgical History: Past Surgical History:  Procedure Laterality Date   COSMETIC SURGERY  1997   breast reduction   ESOPHAGOGASTRODUODENOSCOPY (EGD) WITH PROPOFOL N/A 01/24/2018   Procedure: ESOPHAGOGASTRODUODENOSCOPY (EGD) WITH PROPOFOL;  Surgeon: [name], MD;  Location: [facility];  Service: Endoscopy;  Laterality: N/A;   ESOPHAGOGASTRODUODENOSCOPY (EGD) WITH PROPOFOL N/A 09/09/2020   Procedure: ESOPHAGOGASTRODUODENOSCOPY (EGD) WITH PROPOFOL;  Surgeon: [name], MD;  Location: [facility];  Service: Endoscopy;  Laterality: N/A;   ESOPHAGOGASTRODUODENOSCOPY (EGD) WITH PROPOFOL N/A 02/17/2021    Procedure: ESOPHAGOGASTRODUODENOSCOPY (EGD) WITH PROPOFOL;  Surgeon: [name], MD;  Location: [facility];  Service: Endoscopy;  Laterality: N/A;   lipoma removal  2007   back   REDUCTION MAMMAPLASTY Bilateral 12/01/1996    Home Medications:  Allergies as of 02/15/2022       Reactions   Pholcodine    Other reaction(s): wheezing   Codeine    Medical Provider Ez Flu Shot [influenza Vac Split Quad] Other (See Comments)   Severe myalgias and bone pain   Quinolones    Aortic enlargement         Medication List        Accurate as of February 15, 2022  4:57 PM. If you have any questions, ask your nurse or doctor.          STOP taking these medications    omeprazole 40 MG capsule Commonly known as: PRILOSEC Stopped by: Debroah Loop, PA-C       TAKE these medications    estradiol 0.1 MG/GM vaginal cream Commonly known as: ESTRACE Discard applicator. Apply pea-sized amount to tip of finger to urethra before bed. Wash hands well after application. Use Monday, Wednesday and Friday. (Estrogen Cream Instruction  Discard applicator  Apply pea sized amount to tip of finger to urethra before bed. Wash hands well after application. Use Monday, Wednesday and Friday)   metFORMIN 500 MG 24 hr tablet Commonly known as: GLUCOPHAGE-XR TAKE 1 TABLET BY MOUTH TWICE DAILY What changed: additional instructions   nitrofurantoin (macrocrystal-monohydrate)  100 MG capsule Commonly known as: MACROBID Take 1 capsule (100 mg total) by mouth 2 (two) times daily for 5 days. Started by: Debroah Loop, PA-C        Allergies:  Allergies  Allergen Reactions   Pholcodine     Other reaction(s): wheezing   Codeine    Medical Provider Ez Flu Shot [Influenza Vac Split Quad] Other (See Comments)    Severe myalgias and bone pain   Quinolones     Aortic enlargement     Family History: Family History  Problem Relation Age of Onset   Diabetes Mother     Arthritis Mother    Obesity Mother    Diabetes Father    Heart disease Father    Heart attack Father    Stomach cancer Father 44   Asthma Brother    Anxiety disorder Brother    Osteoporosis Maternal Grandmother    Arthritis Maternal Grandmother    Varicose Veins Maternal Grandmother    Heart disease Paternal Grandmother    Stomach cancer Paternal Grandmother    Breast cancer Paternal Grandmother    Cancer Paternal Grandfather        spleen   Heart disease Paternal Grandfather    Alcohol abuse Maternal Uncle    Cancer Paternal Aunt        rare blood cancer; non genetic   Diabetes Paternal Aunt    Cancer Paternal Uncle        rare blood cancer; non genetic    Social History:   reports that she quit smoking about 17 years ago. Her smoking use included cigarettes. She has a 50.00 pack-year smoking history. She has never used smokeless tobacco. She reports that she does not currently use alcohol. She reports that she does not use drugs.  Physical Exam: BP (!) 140/95   Pulse 82   Ht '5\' 3"'$  (1.6 m)   Wt 183 lb (83 kg)   LMP 06/01/2019   BMI 32.42 kg/m   Constitutional:  Alert and oriented, no acute distress, nontoxic appearing HEENT: [place], AT Cardiovascular: No clubbing, cyanosis, or edema Respiratory: Normal respiratory effort, no increased work of breathing Skin: No rashes, bruises or suspicious lesions Neurologic: Grossly intact, no focal deficits, moving all 4 extremities Psychiatric: Normal mood and affect  Laboratory Data: Results for orders placed or performed in visit on 02/15/22  Microscopic Examination   Urine  Result Value Ref Range   WBC, UA >30 (A) 0 - 5 /hpf   RBC, Urine 3-10 (A) 0 - 2 /hpf   Epithelial Cells (non renal) 0-10 0 - 10 /hpf   Bacteria, UA Many (A) None seen/Few  Urinalysis, Complete  Result Value Ref Range   Specific Gravity, UA 1.010 1.005 - 1.030   pH, UA 5.5 5.0 - 7.5   Color, UA Yellow Yellow   Appearance Ur Hazy (A) Clear    Leukocytes,UA 3+ (A) Negative   Protein,UA 2+ (A) Negative/Trace   Glucose, UA Negative Negative   Ketones, UA Negative Negative   RBC, UA 3+ (A) Negative   Bilirubin, UA Negative Negative   Urobilinogen, Ur 0.2 0.2 - 1.0 mg/dL   Nitrite, UA Negative Negative   Microscopic Examination See below:    Assessment & Plan:   1. Dysuria UA today appears grossly infected, will start empiric Macrobid and send for culture for further evaluation.  We will also send for atypical culture given her history of recurrent urinary symptoms with negative cultures.  We will plan  for repeat UA in 7 to 10 days to prove resolution of microscopic hematuria.  We discussed that if she has persistent hematuria, we will need to proceed with hematuria work-up.  She expressed understanding. - Urinalysis, Complete - CULTURE, URINE COMPREHENSIVE - nitrofurantoin, macrocrystal-monohydrate, (MACROBID) 100 MG capsule; Take 1 capsule (100 mg total) by mouth 2 (two) times daily for 5 days.  Dispense: 10 capsule; Refill: 0 - Mycoplasma / ureaplasma culture   Return in about 1 week (around 02/22/2022) for Lab visit for UA.  Debroah Loop, PA-C  [facility] [address], [place], [place] [address] [phone]

## 2022-02-19 LAB — CULTURE, URINE COMPREHENSIVE

## 2022-02-21 ENCOUNTER — Other Ambulatory Visit: Payer: Self-pay | Admitting: Family Medicine

## 2022-02-21 DIAGNOSIS — R300 Dysuria: Secondary | ICD-10-CM

## 2022-02-22 ENCOUNTER — Other Ambulatory Visit: Payer: 59

## 2022-02-22 DIAGNOSIS — R300 Dysuria: Secondary | ICD-10-CM

## 2022-02-22 LAB — URINALYSIS, COMPLETE
Bilirubin, UA: NEGATIVE
Glucose, UA: NEGATIVE
Ketones, UA: NEGATIVE
Nitrite, UA: NEGATIVE
Protein,UA: NEGATIVE
RBC, UA: NEGATIVE
Specific Gravity, UA: 1.025 (ref 1.005–1.030)
Urobilinogen, Ur: 0.2 mg/dL (ref 0.2–1.0)
pH, UA: 6.5 (ref 5.0–7.5)

## 2022-02-22 LAB — MICROSCOPIC EXAMINATION

## 2022-02-27 LAB — MYCOPLASMA / UREAPLASMA CULTURE
Mycoplasma hominis Culture: NEGATIVE
Ureaplasma urealyticum: NEGATIVE

## 2022-03-06 ENCOUNTER — Encounter: Payer: Self-pay | Admitting: Family Medicine

## 2022-03-06 DIAGNOSIS — R7301 Impaired fasting glucose: Secondary | ICD-10-CM

## 2022-03-06 DIAGNOSIS — E049 Nontoxic goiter, unspecified: Secondary | ICD-10-CM

## 2022-03-08 ENCOUNTER — Telehealth: Payer: Self-pay | Admitting: Family Medicine

## 2022-03-08 NOTE — Telephone Encounter (Signed)
Pt states she was informed by [facility]  that they have not received a referral  Pt requesting referral faxed asap due to practice only having 2 appts left for 2023  Pt requesting a cb w/ a status update  Fax [phone]

## 2022-03-14 ENCOUNTER — Telehealth: Payer: Self-pay

## 2022-03-14 NOTE — Telephone Encounter (Unsigned)
Copied from [place] [id number]. Topic: Appointment Scheduling - Scheduling Inquiry for Clinic >> Mar 14, 2022 11:34 AM Erskine Squibb wrote: Reason for CRM: Morey Hummingbird with Dr Benjamine Mola office called in stating they need the patients most recent A1C, Thyroid U/S and FNA biopsy that was done sent to them as soon as possible as they received a referral and some notes from the provider but did not include this information. Please assist further.

## 2022-03-16 ENCOUNTER — Other Ambulatory Visit: Payer: Self-pay

## 2022-03-17 DIAGNOSIS — M7541 Impingement syndrome of right shoulder: Secondary | ICD-10-CM | POA: Diagnosis not present

## 2022-03-22 DIAGNOSIS — D1721 Benign lipomatous neoplasm of skin and subcutaneous tissue of right arm: Secondary | ICD-10-CM | POA: Diagnosis not present

## 2022-03-22 DIAGNOSIS — D1722 Benign lipomatous neoplasm of skin and subcutaneous tissue of left arm: Secondary | ICD-10-CM | POA: Diagnosis not present

## 2022-03-22 DIAGNOSIS — D170 Benign lipomatous neoplasm of skin and subcutaneous tissue of head, face and neck: Secondary | ICD-10-CM | POA: Diagnosis not present

## 2022-03-31 ENCOUNTER — Encounter: Payer: Self-pay | Admitting: Physician Assistant

## 2022-03-31 ENCOUNTER — Ambulatory Visit (INDEPENDENT_AMBULATORY_CARE_PROVIDER_SITE_OTHER): Payer: 59 | Admitting: Physician Assistant

## 2022-03-31 ENCOUNTER — Other Ambulatory Visit: Payer: Self-pay

## 2022-03-31 VITALS — BP 138/92 | HR 94 | Ht 64.0 in | Wt 188.0 lb

## 2022-03-31 DIAGNOSIS — R300 Dysuria: Secondary | ICD-10-CM | POA: Diagnosis not present

## 2022-03-31 LAB — URINALYSIS, COMPLETE
Bilirubin, UA: NEGATIVE
Glucose, UA: NEGATIVE
Ketones, UA: NEGATIVE
Nitrite, UA: POSITIVE — AB
Protein,UA: NEGATIVE
Specific Gravity, UA: 1.01 (ref 1.005–1.030)
Urobilinogen, Ur: 0.2 mg/dL (ref 0.2–1.0)
pH, UA: 5.5 (ref 5.0–7.5)

## 2022-03-31 LAB — MICROSCOPIC EXAMINATION

## 2022-03-31 MED ORDER — NITROFURANTOIN MONOHYD MACRO 100 MG PO CAPS
100.0000 mg | ORAL_CAPSULE | Freq: Two times a day (BID) | ORAL | 0 refills | Status: AC
  Filled 2022-03-31: qty 10, 5d supply, fill #0

## 2022-03-31 NOTE — Patient Instructions (Signed)
Recurrent UTI Prevention Strategies  Start taking an over-the-counter cranberry supplement for urinary tract health. Take this once or twice daily on an empty stomach, e.g. right before bed. Start taking an over-the-counter d-mannose supplement. Take this daily per packaging instructions. Note: You may be able to find combined cranberry and d-mannose supplements online! Start taking an over-the-counter probiotic containing the bacterial species called Lactobacillus. Take this daily. Continue vaginal estrogen cream. Apply a pea-sized amount around the opening of the urethra three times weekly forever.

## 2022-03-31 NOTE — Progress Notes (Signed)
03/31/2022 2:19 PM   Brittany Ochoa [birth year] [id number]  CC: Chief Complaint  Patient presents with   Dysuria   HPI: Brittany Ochoa is a 56 y.o. female with PMH urgency/frequency/pelvic pain on topical vaginal estrogen cream who presents today for evaluation of possible UTI.   Today she reports increased urgency, urge incontinence, and nocturia over baseline x 1 week with new dysuria starting yesterday.  She took Azo this morning.  She has been using vaginal estrogen cream every 2 days.  She wonders what she can be doing to help with UTI prevention, as her last infection was about 6 weeks ago.  She is hesitant to start daily suppressive antibiotics.  In-office UA today positive for 1+ blood, nitrites, and 1+ leukocytes; urine microscopy with 11-30 WBCs/HPF, 3-10 RBCs/HPF, and moderate bacteria.   PMH: Past Medical History:  Diagnosis Date   Allergy 2015   seasonal   Arthritis    Ascending aortic aneurysm ([place])    a. 06/2018 CTA Chest: 4.1 x 4.0 cm fusiform Asc Ao aneurysm-unchanged.  Descending thoracic aorta 2.1 cm.   Asthma    exercised induced   Diabetes ([place])    Dyspnea on exertion    a. 06/2017 echo: EF 60 to 65%.  No regional wall motion abnormalities.  Ascending aortic diameter 3.9 cm.  Mild MR.   Essential hypertension    Lipoma    of neck and chest   Multiple thyroid nodules    OCD (obsessive compulsive disorder)    Reflux esophagitis     Surgical History: Past Surgical History:  Procedure Laterality Date   COSMETIC SURGERY  1997   breast reduction   ESOPHAGOGASTRODUODENOSCOPY (EGD) WITH PROPOFOL N/A 01/24/2018   Procedure: ESOPHAGOGASTRODUODENOSCOPY (EGD) WITH PROPOFOL;  Surgeon: [name], MD;  Location: [facility];  Service: Endoscopy;  Laterality: N/A;   ESOPHAGOGASTRODUODENOSCOPY (EGD) WITH PROPOFOL N/A 09/09/2020   Procedure: ESOPHAGOGASTRODUODENOSCOPY (EGD) WITH PROPOFOL;  Surgeon: [name], MD;  Location: [facility];   Service: Endoscopy;  Laterality: N/A;   ESOPHAGOGASTRODUODENOSCOPY (EGD) WITH PROPOFOL N/A 02/17/2021   Procedure: ESOPHAGOGASTRODUODENOSCOPY (EGD) WITH PROPOFOL;  Surgeon: [name], MD;  Location: [facility];  Service: Endoscopy;  Laterality: N/A;   lipoma removal  2007   back   REDUCTION MAMMAPLASTY Bilateral 12/01/1996    Home Medications:  Allergies as of 03/31/2022       Reactions   Pholcodine    Other reaction(s): wheezing   Codeine    Medical Provider Ez Flu Shot [influenza Vac Split Quad] Other (See Comments)   Severe myalgias and bone pain   Quinolones    Aortic enlargement         Medication List        Accurate as of March 31, 2022  2:19 PM. If you have any questions, ask your nurse or doctor.          estradiol 0.1 MG/GM vaginal cream Commonly known as: ESTRACE Discard applicator. Apply pea-sized amount to tip of finger to urethra before bed. Wash hands well after application. Use Monday, Wednesday and Friday. (Estrogen Cream Instruction  Discard applicator  Apply pea sized amount to tip of finger to urethra before bed. Wash hands well after application. Use Monday, Wednesday and Friday)   metFORMIN 500 MG 24 hr tablet Commonly known as: GLUCOPHAGE-XR TAKE 1 TABLET BY MOUTH TWICE DAILY   nitrofurantoin (macrocrystal-monohydrate) 100 MG capsule Commonly known as: MACROBID Take 1 capsule (100 mg total) by mouth 2 (two)  times daily for 5 days. Started by: Debroah Loop, PA-C        Allergies:  Allergies  Allergen Reactions   Pholcodine     Other reaction(s): wheezing   Codeine    Medical Provider Ez Flu Shot [Influenza Vac Split Quad] Other (See Comments)    Severe myalgias and bone pain   Quinolones     Aortic enlargement     Family History: Family History  Problem Relation Age of Onset   Diabetes Mother    Arthritis Mother    Obesity Mother    Diabetes Father    Heart disease Father    Heart attack  Father    Stomach cancer Father 48   Asthma Brother    Anxiety disorder Brother    Osteoporosis Maternal Grandmother    Arthritis Maternal Grandmother    Varicose Veins Maternal Grandmother    Heart disease Paternal Grandmother    Stomach cancer Paternal Grandmother    Breast cancer Paternal Grandmother    Cancer Paternal Grandfather        spleen   Heart disease Paternal Grandfather    Alcohol abuse Maternal Uncle    Cancer Paternal Aunt        rare blood cancer; non genetic   Diabetes Paternal Aunt    Cancer Paternal Uncle        rare blood cancer; non genetic    Social History:   reports that she quit smoking about 17 years ago. Her smoking use included cigarettes. She has a 50.00 pack-year smoking history. She has been exposed to tobacco smoke. She has never used smokeless tobacco. She reports that she does not currently use alcohol. She reports that she does not use drugs.  Physical Exam: BP (!) 138/92   Pulse 94   Ht '5\' 4"'$  (1.626 m)   Wt 188 lb (85.3 kg)   LMP 06/01/2019   BMI 32.27 kg/m   Constitutional:  Alert and oriented, no acute distress, nontoxic appearing HEENT: [place], AT Cardiovascular: No clubbing, cyanosis, or edema Respiratory: Normal respiratory effort, no increased work of breathing Skin: No rashes, bruises or suspicious lesions Neurologic: Grossly intact, no focal deficits, moving all 4 extremities Psychiatric: Normal mood and affect  Laboratory Data: Results for orders placed or performed in visit on 03/31/22  Microscopic Examination   Urine  Result Value Ref Range   WBC, UA 11-30 (A) 0 - 5 /hpf   RBC, Urine 3-10 (A) 0 - 2 /hpf   Epithelial Cells (non renal) 0-10 0 - 10 /hpf   Bacteria, UA Moderate (A) None seen/Few  Urinalysis, Complete  Result Value Ref Range   Specific Gravity, UA 1.010 1.005 - 1.030   pH, UA 5.5 5.0 - 7.5   Color, UA Yellow Yellow   Appearance Ur Clear Clear   Leukocytes,UA 1+ (A) Negative   Protein,UA Negative  Negative/Trace   Glucose, UA Negative Negative   Ketones, UA Negative Negative   RBC, UA 1+ (A) Negative   Bilirubin, UA Negative Negative   Urobilinogen, Ur 0.2 0.2 - 1.0 mg/dL   Nitrite, UA Positive (A) Negative   Microscopic Examination See below:    Assessment & Plan:   1. Dysuria UA again appears grossly infected today, will start empiric Macrobid and send for culture for further evaluation.  If her urine culture is positive, she officially meets the AUA definition for recurrent UTIs.  We discussed starting daily cranberry and d-mannose supplements as well as a lactobacillus containing probiotic.  Will defer suppressive antibiotics for now but revisit this in the future based on her frequency of infection.  I encouraged her to continue vaginal estrogen cream 3 times weekly.  She is in agreement with this plan. - Urinalysis, Complete - CULTURE, URINE COMPREHENSIVE - nitrofurantoin, macrocrystal-monohydrate, (MACROBID) 100 MG capsule; Take 1 capsule (100 mg total) by mouth 2 (two) times daily for 5 days.  Dispense: 10 capsule; Refill: 0   Return if symptoms worsen or fail to improve.  Debroah Loop, PA-C  [facility] [address], [place], [place] [address] [phone]

## 2022-04-04 ENCOUNTER — Other Ambulatory Visit: Payer: Self-pay

## 2022-04-04 DIAGNOSIS — R7301 Impaired fasting glucose: Secondary | ICD-10-CM | POA: Diagnosis not present

## 2022-04-04 DIAGNOSIS — E669 Obesity, unspecified: Secondary | ICD-10-CM | POA: Diagnosis not present

## 2022-04-04 DIAGNOSIS — E6609 Other obesity due to excess calories: Secondary | ICD-10-CM | POA: Diagnosis not present

## 2022-04-04 DIAGNOSIS — R7303 Prediabetes: Secondary | ICD-10-CM | POA: Diagnosis not present

## 2022-04-04 DIAGNOSIS — E049 Nontoxic goiter, unspecified: Secondary | ICD-10-CM | POA: Diagnosis not present

## 2022-04-04 DIAGNOSIS — Z6832 Body mass index (BMI) 32.0-32.9, adult: Secondary | ICD-10-CM | POA: Diagnosis not present

## 2022-04-04 LAB — CULTURE, URINE COMPREHENSIVE

## 2022-04-04 MED ORDER — DEXCOM G7 SENSOR MISC
0 refills | Status: DC
  Filled 2022-04-04 – 2022-04-26 (×3): qty 3, 30d supply, fill #0

## 2022-04-04 MED ORDER — DEXCOM G7 RECEIVER DEVI
0 refills | Status: DC
  Filled 2022-04-04: qty 1, 90d supply, fill #0
  Filled 2022-04-20: qty 1, 30d supply, fill #0

## 2022-04-07 ENCOUNTER — Ambulatory Visit: Payer: 59 | Admitting: Family Medicine

## 2022-04-14 DIAGNOSIS — E119 Type 2 diabetes mellitus without complications: Secondary | ICD-10-CM | POA: Diagnosis not present

## 2022-04-20 ENCOUNTER — Other Ambulatory Visit: Payer: Self-pay

## 2022-04-20 DIAGNOSIS — R7303 Prediabetes: Secondary | ICD-10-CM | POA: Diagnosis not present

## 2022-04-20 DIAGNOSIS — E049 Nontoxic goiter, unspecified: Secondary | ICD-10-CM | POA: Diagnosis not present

## 2022-04-20 DIAGNOSIS — E669 Obesity, unspecified: Secondary | ICD-10-CM | POA: Diagnosis not present

## 2022-04-20 DIAGNOSIS — Z6831 Body mass index (BMI) 31.0-31.9, adult: Secondary | ICD-10-CM | POA: Diagnosis not present

## 2022-04-20 DIAGNOSIS — R7301 Impaired fasting glucose: Secondary | ICD-10-CM | POA: Diagnosis not present

## 2022-04-20 MED ORDER — MOUNJARO 5 MG/0.5ML ~~LOC~~ SOAJ
5.0000 mg | SUBCUTANEOUS | 0 refills | Status: DC
  Filled 2022-04-20 – 2022-04-26 (×2): qty 2, 28d supply, fill #0

## 2022-04-24 ENCOUNTER — Other Ambulatory Visit: Payer: Self-pay | Admitting: Endocrinology

## 2022-04-24 DIAGNOSIS — E049 Nontoxic goiter, unspecified: Secondary | ICD-10-CM

## 2022-04-26 ENCOUNTER — Other Ambulatory Visit: Payer: Self-pay

## 2022-05-03 ENCOUNTER — Ambulatory Visit: Payer: 59 | Admitting: Family Medicine

## 2022-05-05 ENCOUNTER — Other Ambulatory Visit: Payer: Self-pay

## 2022-05-05 MED ORDER — ZEPBOUND 5 MG/0.5ML ~~LOC~~ SOAJ
5.0000 mg | SUBCUTANEOUS | 0 refills | Status: DC
  Filled 2022-05-05 – 2022-05-15 (×2): qty 2, 28d supply, fill #0

## 2022-05-12 ENCOUNTER — Other Ambulatory Visit: Payer: Self-pay

## 2022-05-15 ENCOUNTER — Other Ambulatory Visit: Payer: Self-pay

## 2022-05-15 ENCOUNTER — Ambulatory Visit: Payer: 59 | Admitting: Family Medicine

## 2022-05-15 ENCOUNTER — Telehealth: Payer: 59 | Admitting: Physician Assistant

## 2022-05-15 DIAGNOSIS — B9689 Other specified bacterial agents as the cause of diseases classified elsewhere: Secondary | ICD-10-CM

## 2022-05-15 DIAGNOSIS — J019 Acute sinusitis, unspecified: Secondary | ICD-10-CM

## 2022-05-15 MED ORDER — AMOXICILLIN-POT CLAVULANATE 875-125 MG PO TABS
1.0000 | ORAL_TABLET | Freq: Two times a day (BID) | ORAL | 0 refills | Status: DC
  Filled 2022-05-15: qty 14, 7d supply, fill #0

## 2022-05-15 NOTE — Progress Notes (Signed)

## 2022-05-15 NOTE — Progress Notes (Signed)
  I have spent 5 minutes in review of e-visit questionnaire, review and updating patient chart, medical decision making and response to patient.   Fuad Forget Cody Alford Gamero, PA-C    

## 2022-05-25 ENCOUNTER — Other Ambulatory Visit
Admission: RE | Admit: 2022-05-25 | Discharge: 2022-05-25 | Disposition: A | Discharge status: Home / Self Care | Payer: 59 | Attending: Endocrinology | Admitting: Endocrinology

## 2022-05-25 DIAGNOSIS — E785 Hyperlipidemia, unspecified: Secondary | ICD-10-CM | POA: Insufficient documentation

## 2022-05-25 DIAGNOSIS — E049 Nontoxic goiter, unspecified: Secondary | ICD-10-CM | POA: Diagnosis not present

## 2022-05-25 DIAGNOSIS — R7301 Impaired fasting glucose: Secondary | ICD-10-CM | POA: Insufficient documentation

## 2022-05-25 LAB — LIPID PANEL
Cholesterol: 249 mg/dL — ABNORMAL HIGH (ref 0–200)
HDL: 51 mg/dL (ref >40)
LDL Cholesterol: 166 mg/dL — ABNORMAL HIGH (ref 0–99)
Total CHOL/HDL Ratio: 4.9 RATIO
Triglycerides: 160 mg/dL — ABNORMAL HIGH (ref <150)
VLDL: 32 mg/dL (ref 0–40)

## 2022-05-25 LAB — BASIC METABOLIC PANEL
Anion gap: 9 (ref 5–15)
BUN: 15 mg/dL (ref 6–20)
CO2: 28 mmol/L (ref 22–32)
Calcium: 9.3 mg/dL (ref 8.9–10.3)
Chloride: 99 mmol/L (ref 98–111)
Creatinine, Ser: 0.73 mg/dL (ref 0.44–1.00)
GFR, Estimated: >60 mL/min (ref >60)
Glucose, Bld: 94 mg/dL (ref 70–99)
Potassium: 4.2 mmol/L (ref 3.5–5.1)
Sodium: 136 mmol/L (ref 135–145)

## 2022-05-25 LAB — TSH: TSH: 0.62 u[IU]/mL (ref 0.350–4.500)

## 2022-05-25 LAB — T4, FREE: Free T4: 0.94 ng/dL (ref 0.61–1.12)

## 2022-05-25 LAB — HEMOGLOBIN A1C
Hgb A1c MFr Bld: 5.3 % (ref 4.8–5.6)
Mean Plasma Glucose: 105.41 mg/dL

## 2022-05-26 LAB — T3: T3, Total: 123 ng/dL (ref 71–180)

## 2022-05-26 LAB — T3, FREE: T3, Free: 2.8 pg/mL (ref 2.0–4.4)

## 2022-05-31 ENCOUNTER — Ambulatory Visit
Admission: RE | Admit: 2022-05-31 | Discharge: 2022-05-31 | Disposition: A | Discharge status: Home / Self Care | Payer: 59 | Source: Ambulatory Visit | Attending: Endocrinology | Admitting: Endocrinology

## 2022-05-31 DIAGNOSIS — E049 Nontoxic goiter, unspecified: Secondary | ICD-10-CM

## 2022-05-31 DIAGNOSIS — E042 Nontoxic multinodular goiter: Secondary | ICD-10-CM | POA: Diagnosis not present

## 2022-06-01 DIAGNOSIS — R7301 Impaired fasting glucose: Secondary | ICD-10-CM | POA: Diagnosis not present

## 2022-06-01 DIAGNOSIS — E785 Hyperlipidemia, unspecified: Secondary | ICD-10-CM | POA: Diagnosis not present

## 2022-06-01 DIAGNOSIS — I10 Essential (primary) hypertension: Secondary | ICD-10-CM | POA: Diagnosis not present

## 2022-06-01 DIAGNOSIS — E049 Nontoxic goiter, unspecified: Secondary | ICD-10-CM | POA: Diagnosis not present

## 2022-06-08 ENCOUNTER — Ambulatory Visit: Payer: 59 | Admitting: Family Medicine

## 2022-06-08 ENCOUNTER — Encounter: Payer: Self-pay | Admitting: Family Medicine

## 2022-06-08 VITALS — BP 125/84 | HR 65 | Temp 98.3°F | Ht 64.0 in | Wt 188.6 lb

## 2022-06-08 DIAGNOSIS — R195 Other fecal abnormalities: Secondary | ICD-10-CM | POA: Diagnosis not present

## 2022-06-08 DIAGNOSIS — E049 Nontoxic goiter, unspecified: Secondary | ICD-10-CM | POA: Diagnosis not present

## 2022-06-08 DIAGNOSIS — E042 Nontoxic multinodular goiter: Secondary | ICD-10-CM | POA: Diagnosis not present

## 2022-06-08 DIAGNOSIS — Z1231 Encounter for screening mammogram for malignant neoplasm of breast: Secondary | ICD-10-CM | POA: Diagnosis not present

## 2022-06-08 DIAGNOSIS — R5382 Chronic fatigue, unspecified: Secondary | ICD-10-CM | POA: Diagnosis not present

## 2022-06-08 DIAGNOSIS — R946 Abnormal results of thyroid function studies: Secondary | ICD-10-CM

## 2022-06-08 NOTE — Progress Notes (Signed)
BP 125/84   Pulse 65   Temp 98.3 F (36.8 C) (Oral)   Ht '5\' 4"'$  (1.626 m)   Wt 188 lb 9.6 oz (85.5 kg)   LMP 06/01/2019   SpO2 99%   BMI 32.37 kg/m    Subjective:    Patient ID: Brittany Ochoa, adult    DOB: 1966-01-19, 57 y.o.   MRN: [id number]  HPI: Brittany Ochoa is a 57 y.o. adult  Chief Complaint  Patient presents with   Thyroid Problem   Patient comes in not feeling well. She notes that she continues to be very bloated. She will wake up with her face and hands swollen. She has been sleeping 10-15hours a night. She has been very tired. She has not been able to do her ADLs. She has not been able to shower the way she wants to. She has had hair falling out she has been feeling very cold. She has been having issues with difficulty speaking. She has things that she needs to say repeatedly and gets her tongue twisted and has trouble speaking. 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 She also has severe cramps in her legs when she stretches her legs.   Notes that when she had her colonoscopy she had a bunch of little white things in her stools and also some white strings. She is concerned about parasites. No other concerns or complaints at this time.   Relevant past medical, surgical, family and social history reviewed and updated as indicated. Interim medical history since our last visit reviewed. Allergies and medications reviewed and updated.  Review of Systems  Constitutional:  Positive for fatigue. Negative for activity change, appetite change, chills, diaphoresis, fever and unexpected weight change.  Respiratory: Negative.    Cardiovascular: Negative.   Endocrine: Positive for cold intolerance. Negative for heat intolerance, polydipsia, polyphagia and polyuria.  Neurological: Negative.   Psychiatric/Behavioral:  Positive for decreased concentration. Negative for agitation, behavioral problems, confusion,  dysphoric mood, hallucinations, self-injury, sleep disturbance and suicidal ideas. The patient is not nervous/anxious and is not hyperactive.     Per HPI unless specifically indicated above     Objective:    BP 125/84   Pulse 65   Temp 98.3 F (36.8 C) (Oral)   Ht '5\' 4"'$  (1.626 m)   Wt 188 lb 9.6 oz (85.5 kg)   LMP 06/01/2019   SpO2 99%   BMI 32.37 kg/m   Wt Readings from Last 3 Encounters:  06/08/22 188 lb 9.6 oz (85.5 kg)  03/31/22 188 lb (85.3 kg)  02/15/22 183 lb (83 kg)    Physical Exam Vitals and nursing note reviewed.  Constitutional:      General: She is not in acute distress.    Appearance: Normal appearance. She is not ill-appearing, toxic-appearing or diaphoretic.  HENT:     Head: Normocephalic and atraumatic.     Right Ear: External ear normal.     Left Ear: External ear normal.     Nose: Nose normal.     Mouth/Throat:     Mouth: Mucous membranes are moist.     Pharynx: Oropharynx is clear.  Eyes:     General: No scleral icterus.       Right eye: No discharge.        Left eye: No discharge.     Extraocular Movements: Extraocular movements intact.     Conjunctiva/sclera: Conjunctivae  normal.     Pupils: Pupils are equal, round, and reactive to light.  Cardiovascular:     Rate and Rhythm: Normal rate and regular rhythm.     Pulses: Normal pulses.     Heart sounds: Normal heart sounds. No murmur heard.    No friction rub. No gallop.  Pulmonary:     Effort: Pulmonary effort is normal. No respiratory distress.     Breath sounds: Normal breath sounds. No stridor. No wheezing, rhonchi or rales.  Chest:     Chest wall: No tenderness.  Musculoskeletal:        General: Normal range of motion.     Cervical back: Normal range of motion and neck supple.  Skin:    General: Skin is warm and dry.     Capillary Refill: Capillary refill takes less than 2 seconds.     Coloration: Skin is not jaundiced or pale.     Findings: No bruising, erythema, lesion or rash.   Neurological:     General: No focal deficit present.     Mental Status: She is alert and oriented to person, place, and time. Mental status is at baseline.  Psychiatric:        Mood and Affect: Mood normal.        Behavior: Behavior normal.        Thought Content: Thought content normal.        Judgment: Judgment normal.     Results for orders placed or performed during the hospital encounter of [id number]  Basic metabolic panel  Result Value Ref Range   Sodium 136 135 - 145 mmol/L   Potassium 4.2 3.5 - 5.1 mmol/L   Chloride 99 98 - 111 mmol/L   CO2 28 22 - 32 mmol/L   Glucose, Bld 94 70 - 99 mg/dL   BUN 15 6 - 20 mg/dL   Creatinine, Ser 0.73 0.44 - 1.00 mg/dL   Calcium 9.3 8.9 - 10.3 mg/dL   GFR, Estimated >60 >60 mL/min   Anion gap 9 5 - 15  Hemoglobin A1c  Result Value Ref Range   Hgb A1c MFr Bld 5.3 4.8 - 5.6 %   Mean Plasma Glucose 105.41 mg/dL  Lipid panel  Result Value Ref Range   Cholesterol 249 (H) 0 - 200 mg/dL   Triglycerides 160 (H) <150 mg/dL   HDL 51 >40 mg/dL   Total CHOL/HDL Ratio 4.9 RATIO   VLDL 32 0 - 40 mg/dL   LDL Cholesterol 166 (H) 0 - 99 mg/dL  TSH  Result Value Ref Range   TSH 0.620 0.350 - 4.500 uIU/mL  T4, free  Result Value Ref Range   Free T4 0.94 0.61 - 1.12 ng/dL  T3  Result Value Ref Range   T3, Total 123 71 - 180 ng/dL  T3, free  Result Value Ref Range   T3, Free 2.8 2.0 - 4.4 pg/mL      Assessment & Plan:   Problem List Items Addressed This Visit       Endocrine   Multiple thyroid nodules - Primary    Would like a 2nd opinion regarding her thyroid. Had labs recently which showed an elevated thyrogolobulin. Referral placed today. Await their input.       Relevant Orders   Ambulatory referral to Endocrinology   Goiter    Would like a 2nd opinion regarding her thyroid. Had labs recently which showed an elevated thyrogolobulin. Referral placed today. Await their input.  Relevant Orders   Ambulatory referral to  Endocrinology   Other Visit Diagnoses     Chronic fatigue       Will check labs today. Await results. Treat as needed.   Relevant Orders   Ambulatory referral to Endocrinology   ANA+ENA+DNA/DS+Antich+Centr   CK   Uric acid   Comprehensive metabolic panel   CBC with Differential/Platelet   RA Qn+CCP(IgG/A)+SjoSSA+SjoSSB   VITAMIN D 25 Hydroxy (Vit-D Deficiency, Fractures)   Lyme Disease Serology w/Reflex   Babesia microti, Real-time DNA PCR   Spotted Fever Group Antibodies   Ehrlichia Antibody Panel   Abnormal thyroid function test       Relevant Orders   Ambulatory referral to Endocrinology   Abnormal stools       Will check stool studies. Await results.   Relevant Orders   Ova and parasite examination   Stool Culture   Fecal leukocytes   Encounter for screening mammogram for malignant neoplasm of breast       Mammogram ordered today. Await results.   Relevant Orders   MM 3D SCREEN BREAST BILATERAL        Follow up plan: Return if symptoms worsen or fail to improve.  >25 minutes spent with patient today

## 2022-06-09 ENCOUNTER — Ambulatory Visit: Payer: Commercial Managed Care - PPO | Admitting: Dietician

## 2022-06-09 ENCOUNTER — Encounter: Payer: Self-pay | Admitting: Family Medicine

## 2022-06-09 NOTE — Assessment & Plan Note (Signed)
Would like a 2nd opinion regarding her thyroid. Had labs recently which showed an elevated thyrogolobulin. Referral placed today. Await their input.

## 2022-06-12 ENCOUNTER — Encounter: Payer: Self-pay | Admitting: Family Medicine

## 2022-06-13 ENCOUNTER — Other Ambulatory Visit: Payer: Self-pay | Admitting: Family Medicine

## 2022-06-13 ENCOUNTER — Other Ambulatory Visit: Payer: Self-pay

## 2022-06-13 MED ORDER — VITAMIN D (ERGOCALCIFEROL) 1.25 MG (50000 UNIT) PO CAPS
50000.0000 [IU] | ORAL_CAPSULE | ORAL | 1 refills | Status: DC
  Filled 2022-06-13: qty 12, 84d supply, fill #0

## 2022-06-14 LAB — CBC WITH DIFFERENTIAL/PLATELET
Basophils Absolute: 0 10*3/uL (ref 0.0–0.2)
Basos: 1 %
EOS (ABSOLUTE): 0.1 10*3/uL (ref 0.0–0.4)
Eos: 1 %
Hematocrit: 41.4 % (ref 34.0–46.6)
Hemoglobin: 14 g/dL (ref 11.1–15.9)
Immature Grans (Abs): 0 10*3/uL (ref 0.0–0.1)
Immature Granulocytes: 1 %
Lymphocytes Absolute: 1.4 10*3/uL (ref 0.7–3.1)
Lymphs: 22 %
MCH: 30.8 pg (ref 26.6–33.0)
MCHC: 33.8 g/dL (ref 31.5–35.7)
MCV: 91 fL (ref 79–97)
Monocytes Absolute: 0.4 10*3/uL (ref 0.1–0.9)
Monocytes: 6 %
Neutrophils Absolute: 4.5 10*3/uL (ref 1.4–7.0)
Neutrophils: 69 %
Platelets: 248 10*3/uL (ref 150–450)
RBC: 4.54 x10E6/uL (ref 3.77–5.28)
RDW: 12.7 % (ref 11.7–15.4)
WBC: 6.4 10*3/uL (ref 3.4–10.8)

## 2022-06-14 LAB — EHRLICHIA ANTIBODY PANEL
E. Chaffeensis (HME) IgM Titer: NEGATIVE
E.Chaffeensis (HME) IgG: NEGATIVE
HGE IgG Titer: NEGATIVE
HGE IgM Titer: NEGATIVE

## 2022-06-14 LAB — LYME DISEASE SEROLOGY W/REFLEX: Lyme Total Antibody EIA: NEGATIVE

## 2022-06-14 LAB — COMPREHENSIVE METABOLIC PANEL
ALT: 26 IU/L (ref 0–32)
AST: 23 IU/L (ref 0–40)
Albumin/Globulin Ratio: 2 (ref 1.2–2.2)
Albumin: 4.5 g/dL (ref 3.8–4.9)
Alkaline Phosphatase: 85 IU/L (ref 44–121)
BUN/Creatinine Ratio: 19 (ref 9–23)
BUN: 13 mg/dL (ref 6–24)
Bilirubin Total: 0.5 mg/dL (ref 0.0–1.2)
CO2: 22 mmol/L (ref 20–29)
Calcium: 9.7 mg/dL (ref 8.7–10.2)
Chloride: 102 mmol/L (ref 96–106)
Creatinine, Ser: 0.7 mg/dL (ref 0.57–1.00)
Globulin, Total: 2.2 g/dL (ref 1.5–4.5)
Glucose: 94 mg/dL (ref 70–99)
Potassium: 4.3 mmol/L (ref 3.5–5.2)
Sodium: 142 mmol/L (ref 134–144)
Total Protein: 6.7 g/dL (ref 6.0–8.5)
eGFR: 103 mL/min/{1.73_m2} (ref >59)

## 2022-06-14 LAB — ANA+ENA+DNA/DS+ANTICH+CENTR
ANA Titer 1: NEGATIVE
Anti JO-1: <0.2 AI (ref 0.0–0.9)
Centromere Ab Screen: <0.2 AI (ref 0.0–0.9)
Chromatin Ab SerPl-aCnc: <0.2 AI (ref 0.0–0.9)
ENA RNP Ab: <0.2 AI (ref 0.0–0.9)
ENA SM Ab Ser-aCnc: <0.2 AI (ref 0.0–0.9)
ENA SSA (RO) Ab: <0.2 AI (ref 0.0–0.9)
ENA SSB (LA) Ab: <0.2 AI (ref 0.0–0.9)
Scleroderma (Scl-70) (ENA) Antibody, IgG: <0.2 AI (ref 0.0–0.9)
dsDNA Ab: <1 IU/mL (ref 0–9)

## 2022-06-14 LAB — URIC ACID: Uric Acid: 6.2 mg/dL (ref 3.0–7.2)

## 2022-06-14 LAB — CK: Total CK: 78 U/L (ref 32–182)

## 2022-06-14 LAB — RA QN+CCP(IGG/A)+SJOSSA+SJOSSB
Cyclic Citrullin Peptide Ab: 5 units (ref 0–19)
Rheumatoid fact SerPl-aCnc: <10 IU/mL (ref <14.0)

## 2022-06-14 LAB — SPOTTED FEVER GROUP ANTIBODIES
Spotted Fever Group IgG: <1:64 {titer}
Spotted Fever Group IgM: <1:64 {titer}

## 2022-06-14 LAB — VITAMIN D 25 HYDROXY (VIT D DEFICIENCY, FRACTURES): Vit D, 25-Hydroxy: 18 ng/mL — ABNORMAL LOW (ref 30.0–100.0)

## 2022-06-14 LAB — BABESIA MICROTI, REAL-TIME DNA PCR: Babesia microti PCR: NEGATIVE

## 2022-06-15 ENCOUNTER — Encounter: Payer: Self-pay | Admitting: Family Medicine

## 2022-06-15 ENCOUNTER — Other Ambulatory Visit: Payer: Self-pay | Admitting: Family Medicine

## 2022-06-15 DIAGNOSIS — R413 Other amnesia: Secondary | ICD-10-CM

## 2022-06-16 ENCOUNTER — Other Ambulatory Visit: Payer: Self-pay | Admitting: Family Medicine

## 2022-06-16 DIAGNOSIS — N644 Mastodynia: Secondary | ICD-10-CM

## 2022-06-27 ENCOUNTER — Inpatient Hospital Stay
Admission: RE | Admit: 2022-06-27 | Discharge: 2022-06-27 | Disposition: A | Discharge status: Home / Self Care | Payer: Self-pay | Source: Ambulatory Visit | Attending: *Deleted | Admitting: *Deleted

## 2022-06-27 ENCOUNTER — Other Ambulatory Visit: Payer: Self-pay | Admitting: *Deleted

## 2022-06-27 DIAGNOSIS — Z1231 Encounter for screening mammogram for malignant neoplasm of breast: Secondary | ICD-10-CM

## 2022-06-28 ENCOUNTER — Ambulatory Visit: Payer: 59 | Admitting: Nurse Practitioner

## 2022-06-28 ENCOUNTER — Ambulatory Visit: Payer: Self-pay

## 2022-06-28 ENCOUNTER — Encounter: Payer: Self-pay | Admitting: Nurse Practitioner

## 2022-06-28 VITALS — BP 139/79 | HR 73 | Temp 98.0°F | Wt 191.6 lb

## 2022-06-28 DIAGNOSIS — R109 Unspecified abdominal pain: Secondary | ICD-10-CM | POA: Diagnosis not present

## 2022-06-28 DIAGNOSIS — R1032 Left lower quadrant pain: Secondary | ICD-10-CM

## 2022-06-28 LAB — URINALYSIS, ROUTINE W REFLEX MICROSCOPIC
Bilirubin, UA: NEGATIVE
Glucose, UA: NEGATIVE
Ketones, UA: NEGATIVE
Leukocytes,UA: NEGATIVE
Nitrite, UA: NEGATIVE
Protein,UA: NEGATIVE
RBC, UA: NEGATIVE
Specific Gravity, UA: 1.01 (ref 1.005–1.030)
Urobilinogen, Ur: 0.2 mg/dL (ref 0.2–1.0)
pH, UA: 6 (ref 5.0–7.5)

## 2022-06-28 LAB — CBC WITH DIFFERENTIAL/PLATELET
Hematocrit: 39.8 % (ref 34.0–46.6)
Hemoglobin: 13.6 g/dL (ref 11.1–15.9)
Lymphocytes Absolute: 2 10*3/uL (ref 0.7–3.1)
Lymphs: 30 %
MCH: 30.4 pg (ref 26.6–33.0)
MCHC: 34.2 g/dL (ref 31.5–35.7)
MCV: 89 fL (ref 79–97)
MID (Absolute): 0.4 10*3/uL (ref 0.1–1.6)
MID: 6 %
Neutrophils Absolute: 4.5 10*3/uL (ref 1.4–7.0)
Neutrophils: 64 %
Platelets: 249 10*3/uL (ref 150–450)
RBC: 4.48 x10E6/uL (ref 3.77–5.28)
RDW: 13.2 % (ref 11.7–15.4)
WBC: 6.9 10*3/uL (ref 3.4–10.8)

## 2022-06-28 NOTE — Telephone Encounter (Signed)
     Chief Complaint: Lower abdominal pain, bloating. Had diarrhea this week Symptoms: Above Frequency: 3 days ago Pertinent Negatives: Patient denies fever Disposition: [] ED /[] Urgent Care (no appt availability in office) / [x] Appointment(In office/virtual)/ []  [facility]/ [] Home Care/ [] Refused Recommended Disposition /[]  Mobile Bus/ []  Follow-up with PCP Additional Notes:   Reason for Disposition  [1] MILD-MODERATE pain AND [2] constant AND [3] present > 2 hours  Answer Assessment - Initial Assessment Questions 1. LOCATION: "Where does it hurt?"      Lower, middle 2. RADIATION: "Does the pain shoot anywhere else?" (e.g., chest, back)     No 3. ONSET: "When did the pain begin?" (e.g., minutes, hours or days ago)      3 days 4. SUDDEN: "Gradual or sudden onset?"     Gradual 5. PATTERN "Does the pain come and go, or is it constant?"    - If it comes and goes: "How long does it last?" "Do you have pain now?"     (Note: Comes and goes means the pain is intermittent. It goes away completely between bouts.)    - If constant: "Is it getting better, staying the same, or getting worse?"      (Note: Constant means the pain never goes away completely; most serious pain is constant and gets worse.)      Comes and goes 6. SEVERITY: "How bad is the pain?"  (e.g., Scale 1-10; mild, moderate, or severe)    - MILD (1-3): Doesn't interfere with normal activities, abdomen soft and not tender to touch.     - MODERATE (4-7): Interferes with normal activities or awakens from sleep, abdomen tender to touch.     - SEVERE (8-10): Excruciating pain, doubled over, unable to do any normal activities.       Now - 8 7. RECURRENT SYMPTOM: "Have you ever had this type of stomach pain before?" If Yes, ask: "When was the last time?" and "What happened that time?"      This is different 8. CAUSE: "What do you think is causing the stomach pain?"     Unsure 9. RELIEVING/AGGRAVATING FACTORS:  "What makes it better or worse?" (e.g., antacids, bending or twisting motion, bowel movement)     No 10. OTHER SYMPTOMS: "Do you have any other symptoms?" (e.g., back pain, diarrhea, fever, urination pain, vomiting)       Diarrhea 11. PREGNANCY: "Is there any chance you are pregnant?" "When was your last menstrual period?"       No  Protocols used: Abdominal Pain - [facility]

## 2022-06-28 NOTE — Progress Notes (Signed)
BP 139/79   Pulse 73   Temp 98 F (36.7 C) (Oral)   Wt 191 lb 9.6 oz (86.9 kg)   LMP 06/01/2019   SpO2 95%   BMI 32.89 kg/m    Subjective:    Patient ID: Brittany Ochoa, adult    DOB: April 28, 1965, 57 y.o.   MRN: [id number]  HPI: Brittany Ochoa is a 57 y.o. adult  Chief Complaint  Patient presents with   Abdominal Pain    Pt states she has been having lower abdominal pain for the last couple of days. States she had diarrhea on Monday and has not been able to eat or drink much at all for the last couple of days. States she is bloated and nauseous as well. States her intestines are hurting and very sore.    ABDOMINAL ISSUES Duration: days started last week and escalated Monday then today has been the worse. She is able to drink liquids.  Nature: bloating Location: LLQ and RLQ  Severity: 8/10  Radiation: no Episode duration: Frequency: constant Alleviating factors: none Aggravating factors: none Treatments attempted: none Constipation: no Diarrhea: yes Episodes of diarrhea/day: "all day" Mucous in the stool: yes Heartburn: yes Bloating:yes Flatulence: no Nausea: yes Vomiting: no Episodes of vomit/day: Melena or hematochezia: no Rash: no Jaundice: no Fever: no Weight loss: no Feels pain on the inside after sitting down.  No pain with urination or vaginal discharge.  Relevant past medical, surgical, family and social history reviewed and updated as indicated. Interim medical history since our last visit reviewed. Allergies and medications reviewed and updated.  Review of Systems  Gastrointestinal:  Positive for abdominal distention, abdominal pain, diarrhea and nausea. Negative for blood in stool, constipation and vomiting.    Per HPI unless specifically indicated above     Objective:    BP 139/79   Pulse 73   Temp 98 F (36.7 C) (Oral)   Wt 191 lb 9.6 oz (86.9 kg)   LMP 06/01/2019   SpO2 95%   BMI 32.89 kg/m   Wt Readings from Last 3 Encounters:   06/28/22 191 lb 9.6 oz (86.9 kg)  06/08/22 188 lb 9.6 oz (85.5 kg)  03/31/22 188 lb (85.3 kg)    Physical Exam Vitals and nursing note reviewed.  Constitutional:      General: She is not in acute distress.    Appearance: Normal appearance. She is normal weight. She is not ill-appearing, toxic-appearing or diaphoretic.  HENT:     Head: Normocephalic.     Right Ear: External ear normal.     Left Ear: External ear normal.     Nose: Nose normal.     Mouth/Throat:     Mouth: Mucous membranes are moist.     Pharynx: Oropharynx is clear.  Eyes:     General:        Right eye: No discharge.        Left eye: No discharge.     Extraocular Movements: Extraocular movements intact.     Conjunctiva/sclera: Conjunctivae normal.     Pupils: Pupils are equal, round, and reactive to light.  Cardiovascular:     Rate and Rhythm: Normal rate and regular rhythm.     Heart sounds: No murmur heard. Pulmonary:     Effort: Pulmonary effort is normal. No respiratory distress.     Breath sounds: Normal breath sounds. No wheezing or rales.  Abdominal:     General: Bowel sounds are decreased. There is distension.  Palpations: Abdomen is soft.     Tenderness: There is abdominal tenderness in the right lower quadrant and left lower quadrant. There is guarding and rebound. There is no right CVA tenderness or left CVA tenderness. Negative signs include Murphy's sign, Rovsing's sign, McBurney's sign, psoas sign and obturator sign.  Musculoskeletal:     Cervical back: Normal range of motion and neck supple.  Skin:    General: Skin is warm and dry.     Capillary Refill: Capillary refill takes less than 2 seconds.  Neurological:     General: No focal deficit present.     Mental Status: She is alert and oriented to person, place, and time. Mental status is at baseline.  Psychiatric:        Mood and Affect: Mood normal.        Behavior: Behavior normal.        Thought Content: Thought content normal.         Judgment: Judgment normal.     Results for orders placed or performed in visit on 06/08/22  ANA+ENA+DNA/DS+Antich+Centr  Result Value Ref Range   ANA Titer 1 Negative    dsDNA Ab <1 0 - 9 IU/mL   ENA RNP Ab <0.2 0.0 - 0.9 AI   ENA SM Ab Ser-aCnc <0.2 0.0 - 0.9 AI   Scleroderma (Scl-70) (ENA) Antibody, IgG <0.2 0.0 - 0.9 AI   ENA SSA (RO) Ab <0.2 0.0 - 0.9 AI   ENA SSB (LA) Ab <0.2 0.0 - 0.9 AI   Chromatin Ab SerPl-aCnc <0.2 0.0 - 0.9 AI   Anti JO-1 <0.2 0.0 - 0.9 AI   Centromere Ab Screen <0.2 0.0 - 0.9 AI   See below: Comment   CK  Result Value Ref Range   Total CK 78 32 - 182 U/L  Uric acid  Result Value Ref Range   Uric Acid 6.2 3.0 - 7.2 mg/dL  Comprehensive metabolic panel  Result Value Ref Range   Glucose 94 70 - 99 mg/dL   BUN 13 6 - 24 mg/dL   Creatinine, Ser 0.70 0.57 - 1.00 mg/dL   eGFR 103 >59 mL/min/1.73   BUN/Creatinine Ratio 19 9 - 23   Sodium 142 134 - 144 mmol/L   Potassium 4.3 3.5 - 5.2 mmol/L   Chloride 102 96 - 106 mmol/L   CO2 22 20 - 29 mmol/L   Calcium 9.7 8.7 - 10.2 mg/dL   Total Protein 6.7 6.0 - 8.5 g/dL   Albumin 4.5 3.8 - 4.9 g/dL   Globulin, Total 2.2 1.5 - 4.5 g/dL   Albumin/Globulin Ratio 2.0 1.2 - 2.2   Bilirubin Total 0.5 0.0 - 1.2 mg/dL   Alkaline Phosphatase 85 44 - 121 IU/L   AST 23 0 - 40 IU/L   ALT 26 0 - 32 IU/L  CBC with Differential/Platelet  Result Value Ref Range   WBC 6.4 3.4 - 10.8 x10E3/uL   RBC 4.54 3.77 - 5.28 x10E6/uL   Hemoglobin 14.0 11.1 - 15.9 g/dL   Hematocrit 41.4 34.0 - 46.6 %   MCV 91 79 - 97 fL   MCH 30.8 26.6 - 33.0 pg   MCHC 33.8 31.5 - 35.7 g/dL   RDW 12.7 11.7 - 15.4 %   Platelets 248 150 - 450 x10E3/uL   Neutrophils 69 Not Estab. %   Lymphs 22 Not Estab. %   Monocytes 6 Not Estab. %   Eos 1 Not Estab. %   Basos 1 Not Estab. %  Neutrophils Absolute 4.5 1.4 - 7.0 x10E3/uL   Lymphocytes Absolute 1.4 0.7 - 3.1 x10E3/uL   Monocytes Absolute 0.4 0.1 - 0.9 x10E3/uL   EOS (ABSOLUTE) 0.1 0.0 - 0.4  x10E3/uL   Basophils Absolute 0.0 0.0 - 0.2 x10E3/uL   Immature Granulocytes 1 Not Estab. %   Immature Grans (Abs) 0.0 0.0 - 0.1 x10E3/uL  RA Qn+CCP(IgG/A)+SjoSSA+SjoSSB  Result Value Ref Range   Rhuematoid fact SerPl-aCnc <10.0 XX123456 IU/mL   Cyclic Citrullin Peptide Ab 5 0 - 19 units  VITAMIN D 25 Hydroxy (Vit-D Deficiency, Fractures)  Result Value Ref Range   Vit D, 25-Hydroxy 18.0 (L) 30.0 - 100.0 ng/mL  Lyme Disease Serology w/Reflex  Result Value Ref Range   Lyme Total Antibody EIA Negative Negative  Babesia microti, Real-time DNA PCR  Result Value Ref Range   Babesia microti PCR Negative Negative  Spotted Fever Group Antibodies  Result Value Ref Range   Spotted Fever Group IgG <1:64 Neg:<1:64   Spotted Fever Group IgM <1:64 Neg:<1:64   Result Comment Comment   Ehrlichia Antibody Panel  Result Value Ref Range   E.Chaffeensis (HME) IgG Negative Neg:<1:64   E. Chaffeensis (HME) IgM Titer Negative Neg:<1:20   HGE IgG Titer Negative Neg:<1:64   HGE IgM Titer Negative Neg:<1:20   Result Comment: Comment       Assessment & Plan:   Problem List Items Addressed This Visit   None Visit Diagnoses     Left lower quadrant abdominal pain    -  Primary   Acute pain with diarrhea. Tenderness in LLQ and RLQ. UA negative in office. WBC 6.9 in office. Will order STAT CT to rule out diverticulitis.   Relevant Orders   Urinalysis, Routine w reflex microscopic   CBC With Differential/Platelet (STAT)   Comp Met (CMET)   CT Abdomen Pelvis W Contrast   Abdominal pain, unspecified abdominal location       Relevant Orders   CT Abdomen Pelvis W Contrast        Follow up plan: Return if symptoms worsen or fail to improve.   A total of 30 minutes were spent on this encounter today.  When total time is documented, this includes both the face-to-face and non-face-to-face time personally spent before, during and after the visit on the date of the encounter reviewing symptoms, discussing  the plan and ordering tests.

## 2022-06-28 NOTE — Patient Instructions (Addendum)
[facility], [place], [place] [address]- outpatient imaging  For scheduling it is [phone]

## 2022-06-29 ENCOUNTER — Ambulatory Visit
Admission: RE | Admit: 2022-06-29 | Discharge: 2022-06-29 | Disposition: A | Discharge status: Home / Self Care | Payer: 59 | Source: Ambulatory Visit | Attending: Nurse Practitioner | Admitting: Nurse Practitioner

## 2022-06-29 DIAGNOSIS — R109 Unspecified abdominal pain: Secondary | ICD-10-CM | POA: Diagnosis not present

## 2022-06-29 DIAGNOSIS — R1032 Left lower quadrant pain: Secondary | ICD-10-CM | POA: Diagnosis not present

## 2022-06-29 LAB — COMPREHENSIVE METABOLIC PANEL
ALT: 32 IU/L (ref 0–32)
AST: 30 IU/L (ref 0–40)
Albumin/Globulin Ratio: 2.4 — ABNORMAL HIGH (ref 1.2–2.2)
Albumin: 4.5 g/dL (ref 3.8–4.9)
Alkaline Phosphatase: 86 IU/L (ref 44–121)
BUN/Creatinine Ratio: 14 (ref 9–23)
BUN: 9 mg/dL (ref 6–24)
Bilirubin Total: 0.5 mg/dL (ref 0.0–1.2)
CO2: 22 mmol/L (ref 20–29)
Calcium: 9.2 mg/dL (ref 8.7–10.2)
Chloride: 104 mmol/L (ref 96–106)
Creatinine, Ser: 0.66 mg/dL (ref 0.57–1.00)
Globulin, Total: 1.9 g/dL (ref 1.5–4.5)
Glucose: 90 mg/dL (ref 70–99)
Potassium: 4.1 mmol/L (ref 3.5–5.2)
Sodium: 140 mmol/L (ref 134–144)
Total Protein: 6.4 g/dL (ref 6.0–8.5)
eGFR: 104 mL/min/{1.73_m2} (ref >59)

## 2022-06-29 MED ORDER — IOHEXOL 300 MG/ML  SOLN
100.0000 mL | Freq: Once | INTRAMUSCULAR | Status: AC | PRN
  Administered 2022-06-29: 100 mL via INTRAVENOUS

## 2022-06-29 NOTE — Progress Notes (Signed)
Hi Havilah. Your lab work looks good.  We will await your CT results and discuss further treatment from there.

## 2022-06-29 NOTE — Progress Notes (Signed)
Hi Dilpreet. Your CT looks good.  No evidence of diverticulitis, appendicitis or ovarian cyst. It is likely a viral process that needs to run its course.    Other findings include fibroids in your uterus and a fatty liver.

## 2022-06-30 ENCOUNTER — Ambulatory Visit: Payer: 59 | Admitting: Family Medicine

## 2022-07-11 ENCOUNTER — Ambulatory Visit
Admission: RE | Admit: 2022-07-11 | Discharge: 2022-07-11 | Disposition: A | Discharge status: Home / Self Care | Payer: 59 | Source: Ambulatory Visit | Attending: Family Medicine | Admitting: Family Medicine

## 2022-07-11 DIAGNOSIS — N644 Mastodynia: Secondary | ICD-10-CM | POA: Diagnosis not present

## 2022-07-13 ENCOUNTER — Other Ambulatory Visit: Payer: Self-pay

## 2022-07-13 MED ORDER — AMOXICILLIN 875 MG PO TABS
875.0000 mg | ORAL_TABLET | Freq: Two times a day (BID) | ORAL | 0 refills | Status: DC
  Filled 2022-07-13: qty 14, 7d supply, fill #0

## 2022-07-19 ENCOUNTER — Other Ambulatory Visit: Payer: Self-pay

## 2022-07-24 DIAGNOSIS — N926 Irregular menstruation, unspecified: Secondary | ICD-10-CM | POA: Diagnosis not present

## 2022-07-24 DIAGNOSIS — R102 Pelvic and perineal pain: Secondary | ICD-10-CM | POA: Diagnosis not present

## 2022-07-24 DIAGNOSIS — N950 Postmenopausal bleeding: Secondary | ICD-10-CM | POA: Diagnosis not present

## 2022-08-06 ENCOUNTER — Telehealth: Payer: 59 | Admitting: Family Medicine

## 2022-08-06 DIAGNOSIS — R399 Unspecified symptoms and signs involving the genitourinary system: Secondary | ICD-10-CM

## 2022-08-06 MED ORDER — CEPHALEXIN 500 MG PO CAPS: 500.0000 mg | ORAL_CAPSULE | Freq: Two times a day (BID) | ORAL | 0 refills | Status: AC

## 2022-08-06 NOTE — Progress Notes (Signed)

## 2022-08-08 DIAGNOSIS — M7542 Impingement syndrome of left shoulder: Secondary | ICD-10-CM | POA: Diagnosis not present

## 2022-08-08 DIAGNOSIS — M7062 Trochanteric bursitis, left hip: Secondary | ICD-10-CM | POA: Diagnosis not present

## 2022-08-14 ENCOUNTER — Other Ambulatory Visit: Payer: Self-pay

## 2022-08-14 MED ORDER — METFORMIN HCL ER 500 MG PO TB24
500.0000 mg | ORAL_TABLET | Freq: Two times a day (BID) | ORAL | 6 refills | Status: DC
  Filled 2022-08-14: qty 60, 30d supply, fill #0
  Filled 2022-09-18: qty 60, 30d supply, fill #1
  Filled 2022-10-16: qty 60, 30d supply, fill #2
  Filled 2022-11-16: qty 60, 30d supply, fill #3
  Filled 2022-12-16: qty 60, 30d supply, fill #4
  Filled 2023-01-16: qty 60, 30d supply, fill #5
  Filled 2023-04-04: qty 60, 30d supply, fill #6

## 2022-08-15 ENCOUNTER — Ambulatory Visit: Payer: 59 | Admitting: Obstetrics and Gynecology

## 2022-08-25 DIAGNOSIS — N950 Postmenopausal bleeding: Secondary | ICD-10-CM | POA: Diagnosis not present

## 2022-08-25 DIAGNOSIS — R9389 Abnormal findings on diagnostic imaging of other specified body structures: Secondary | ICD-10-CM | POA: Diagnosis not present

## 2022-08-29 ENCOUNTER — Other Ambulatory Visit: Payer: Self-pay | Admitting: Surgery

## 2022-08-29 DIAGNOSIS — I7121 Aneurysm of the ascending aorta, without rupture: Secondary | ICD-10-CM

## 2022-09-04 ENCOUNTER — Other Ambulatory Visit: Payer: Self-pay | Admitting: Surgery

## 2022-09-04 DIAGNOSIS — I7121 Aneurysm of the ascending aorta, without rupture: Secondary | ICD-10-CM

## 2022-09-08 ENCOUNTER — Ambulatory Visit: Payer: 59 | Admitting: Family Medicine

## 2022-09-14 ENCOUNTER — Encounter: Payer: Self-pay | Admitting: Family Medicine

## 2022-09-14 ENCOUNTER — Ambulatory Visit: Payer: 59 | Admitting: Family Medicine

## 2022-09-14 VITALS — BP 131/86 | HR 73 | Temp 98.7°F | Ht 64.0 in | Wt 174.2 lb

## 2022-09-14 DIAGNOSIS — N950 Postmenopausal bleeding: Secondary | ICD-10-CM | POA: Diagnosis not present

## 2022-09-14 DIAGNOSIS — R7301 Impaired fasting glucose: Secondary | ICD-10-CM

## 2022-09-14 DIAGNOSIS — R5383 Other fatigue: Secondary | ICD-10-CM | POA: Diagnosis not present

## 2022-09-14 LAB — BAYER DCA HB A1C WAIVED: HB A1C (BAYER DCA - WAIVED): 5.6 % (ref 4.8–5.6)

## 2022-09-14 NOTE — Progress Notes (Signed)
BP 131/86   Pulse 73   Temp 98.7 F (37.1 C) (Oral)   Ht 5\' 4"  (1.626 m)   Wt 174 lb 3.2 oz (79 kg)   LMP 06/01/2019   SpO2 97%   BMI 29.90 kg/m    Subjective:    Patient ID: Brittany Ochoa, adult    DOB: Sep 04, 1965, 57 y.o.   MRN: [id number]  HPI: Brittany Ochoa is a 57 y.o. adult  Chief Complaint  Patient presents with   Medication Consultation    Patient would like to discuss stopping her Metformin as she has changed her diet around. Patient says she has lost close to 20-lbs.    Hashimoto's Thyroiditis    Patient would like to discuss having the testing performed for Hashimoto's Disease.    Menstrual Problem    Patient says she has been bleeding since March. Patient says she was told she has a mass on her uterus and has never had any blood work performed at the [facility] office. Patient says she has had some biopsy of the area.    Impaired Fasting Glucose HbA1C:  Lab Results  Component Value Date   HGBA1C 5.6 09/14/2022   Duration of elevated blood sugar: chroinc Polydipsia: no Polyuria: no Weight change: yes- has lost 20lbs Visual disturbance: no Glucose Monitoring: no Diabetic Education: Not Completed Family history of diabetes: yes  CONCERN ABOUT HER THYROID Fatigue: yes Cold intolerance: no Heat intolerance: yes Weight gain: no Weight loss: yes Constipation: no Diarrhea/loose stools: no Palpitations: yes Lower extremity edema: no Anxiety/depressed mood: no  Has been having issues with her period- she had not had a period in years. She is having a sono-histogram done in a couple of weeks. She is then going back in to have it removed. She has been having really intense PMS symptoms. She is otherwise doing well with no other concerns or complaints at this time.   Relevant past medical, surgical, family and social history reviewed and updated as indicated. Interim medical history since our last visit reviewed. Allergies and medications reviewed and  updated.  Review of Systems  Constitutional: Negative.   Respiratory: Negative.    Cardiovascular: Negative.   Gastrointestinal: Negative.   Genitourinary:  Positive for difficulty urinating, menstrual problem, pelvic pain, urgency, vaginal bleeding and vaginal pain. Negative for decreased urine volume, dyspareunia, dysuria, enuresis, flank pain, frequency, genital sores, hematuria, penile discharge, penile pain, penile swelling, scrotal swelling, testicular pain and vaginal discharge.  Musculoskeletal: Negative.   Skin: Negative.   Neurological: Negative.   Psychiatric/Behavioral: Negative.      Per HPI unless specifically indicated above     Objective:    BP 131/86   Pulse 73   Temp 98.7 F (37.1 C) (Oral)   Ht 5\' 4"  (1.626 m)   Wt 174 lb 3.2 oz (79 kg)   LMP 06/01/2019   SpO2 97%   BMI 29.90 kg/m   Wt Readings from Last 3 Encounters:  09/14/22 174 lb 3.2 oz (79 kg)  06/28/22 191 lb 9.6 oz (86.9 kg)  06/08/22 188 lb 9.6 oz (85.5 kg)    Physical Exam Vitals and nursing note reviewed.  Constitutional:      General: She is not in acute distress.    Appearance: Normal appearance. She is normal weight. She is not ill-appearing, toxic-appearing or diaphoretic.  HENT:     Head: Normocephalic and atraumatic.     Right Ear: External ear normal.     Left Ear: External  ear normal.     Nose: Nose normal.     Mouth/Throat:     Mouth: Mucous membranes are moist.     Pharynx: Oropharynx is clear.  Eyes:     General: No scleral icterus.       Right eye: No discharge.        Left eye: No discharge.     Extraocular Movements: Extraocular movements intact.     Conjunctiva/sclera: Conjunctivae normal.     Pupils: Pupils are equal, round, and reactive to light.  Cardiovascular:     Rate and Rhythm: Normal rate and regular rhythm.     Pulses: Normal pulses.     Heart sounds: Normal heart sounds. No murmur heard.    No friction rub. No gallop.  Pulmonary:     Effort: Pulmonary  effort is normal. No respiratory distress.     Breath sounds: Normal breath sounds. No stridor. No wheezing, rhonchi or rales.  Chest:     Chest wall: No tenderness.  Musculoskeletal:        General: Normal range of motion.     Cervical back: Normal range of motion and neck supple.  Skin:    General: Skin is warm and dry.     Capillary Refill: Capillary refill takes less than 2 seconds.     Coloration: Skin is not jaundiced or pale.     Findings: No bruising, erythema, lesion or rash.  Neurological:     General: No focal deficit present.     Mental Status: She is alert and oriented to person, place, and time. Mental status is at baseline.  Psychiatric:        Mood and Affect: Mood normal.        Behavior: Behavior normal.        Thought Content: Thought content normal.        Judgment: Judgment normal.     Results for orders placed or performed in visit on 09/14/22  Bayer DCA Hb A1c Waived  Result Value Ref Range   HB A1C (BAYER DCA - WAIVED) 5.6 4.8 - 5.6 %  Thyroid antibodies  Result Value Ref Range   Thyroperoxidase Ab SerPl-aCnc 12 0 - 34 IU/mL   Thyroglobulin Antibody WILL FOLLOW       Assessment & Plan:   Problem List Items Addressed This Visit       Endocrine   IFG (impaired fasting glucose) - Primary    Doing well with A1c of 5.6. Will cut her metformin to 1 pill a day and recheck in 3 months with goal of stopping. Call with any concerns.       Relevant Orders   Bayer DCA Hb A1c Waived (Completed)   Other Visit Diagnoses     Fatigue, unspecified type       Will check Thyroid antibodies. Await results. Treat as needed.   Relevant Orders   Thyroid antibodies (Completed)   Post-menopausal bleeding       Continue to follow with GYN. Continue to monitor. Call with any concerns.        Follow up plan: Return after 11/11/22 for physical, for records release from GYN please.

## 2022-09-15 ENCOUNTER — Encounter: Payer: Self-pay | Admitting: Family Medicine

## 2022-09-15 NOTE — Assessment & Plan Note (Signed)
Doing well with A1c of 5.6. Will cut her metformin to 1 pill a day and recheck in 3 months with goal of stopping. Call with any concerns.

## 2022-09-17 LAB — THYROID ANTIBODIES
Thyroglobulin Antibody: <1 IU/mL (ref 0.0–0.9)
Thyroperoxidase Ab SerPl-aCnc: 12 IU/mL (ref 0–34)

## 2022-09-21 DIAGNOSIS — N950 Postmenopausal bleeding: Secondary | ICD-10-CM | POA: Diagnosis not present

## 2022-09-21 DIAGNOSIS — Z3202 Encounter for pregnancy test, result negative: Secondary | ICD-10-CM | POA: Diagnosis not present

## 2022-10-06 ENCOUNTER — Encounter (HOSPITAL_BASED_OUTPATIENT_CLINIC_OR_DEPARTMENT_OTHER): Payer: Self-pay | Admitting: Obstetrics

## 2022-10-09 ENCOUNTER — Other Ambulatory Visit: Payer: Self-pay | Admitting: Obstetrics

## 2022-10-09 ENCOUNTER — Other Ambulatory Visit: Payer: Self-pay

## 2022-10-09 ENCOUNTER — Encounter (HOSPITAL_BASED_OUTPATIENT_CLINIC_OR_DEPARTMENT_OTHER): Payer: Self-pay | Admitting: Obstetrics

## 2022-10-09 NOTE — Progress Notes (Addendum)
Spoke w/ via phone for pre-op interview---pt Lab needs dos---- I stat   , ekg   , surgery orders requested dr Ernestina Penna epic ic        Lab results------see below COVID test -----patient states asymptomatic no test needed Arrive at -------1130 am 10-18-2022 NPO after MN NO Solid Food.  Clear liquids from MN until---1030 Med rec completed Medications to take morning of surgery -----none Diabetic medication -----none day of surgery Patient instructed no nail polish to be worn day of surgery Patient instructed to bring photo id and insurance card day of surgery Patient aware to have Driver (ride ) / caregiver   mother pat  for 24 hours after surgery  Patient Special Instructions -----none Pre-Op special Instructions -----none Patient verbalized understanding of instructions that were given at this phone interview. Patient denies shortness of breath, chest pain, fever, cough at this phone interview.  Anesthesia Review:history of palpitaions and hypertension (no htn since 2020), aneurysm of ascending aorta ( 4.0 cm  per 10-06-2020 chest ct epic), thryoid nodules  PCP: Olevia Perches DO lov 09-07-2022 epic Cardiologist : dr Colin Broach 07-15-2021 epic Chest ct 10-06-2020 epic Lov dr Laneta Simmers 09-16-2020 epic EKG :07-15-2021 epic Echo :07-02-2017 epic Stress test:12-12-2019 epic Monitor report 12-12-2019 epic Cardiac Cath : none Activity level: works out on a regular basis,  active Sleep Study/ CPAP none Fasting Blood Sugar :      / Checks Blood Sugar -- times a day:  n/a Blood Thinner/ Instructions /Last Dose:none ASA / Instructions/ Last Dose : none

## 2022-10-11 ENCOUNTER — Other Ambulatory Visit: Payer: 59

## 2022-10-11 ENCOUNTER — Ambulatory Visit
Admission: RE | Admit: 2022-10-11 | Discharge: 2022-10-11 | Disposition: A | Discharge status: Home / Self Care | Payer: 59 | Source: Ambulatory Visit | Attending: Surgery | Admitting: Surgery

## 2022-10-11 ENCOUNTER — Ambulatory Visit: Payer: 59 | Admitting: Surgery

## 2022-10-11 DIAGNOSIS — I7121 Aneurysm of the ascending aorta, without rupture: Secondary | ICD-10-CM

## 2022-10-11 DIAGNOSIS — I3139 Other pericardial effusion (noninflammatory): Secondary | ICD-10-CM | POA: Diagnosis not present

## 2022-10-11 MED ORDER — IOPAMIDOL (ISOVUE-370) INJECTION 76%
75.0000 mL | Freq: Once | INTRAVENOUS | Status: AC | PRN
  Administered 2022-10-11: 75 mL via INTRAVENOUS

## 2022-10-18 ENCOUNTER — Other Ambulatory Visit: Payer: Self-pay

## 2022-10-18 ENCOUNTER — Encounter (HOSPITAL_BASED_OUTPATIENT_CLINIC_OR_DEPARTMENT_OTHER)
Admission: RE | Disposition: A | Discharge status: Home / Self Care | Payer: Self-pay | Source: Home / Self Care | Attending: Obstetrics

## 2022-10-18 ENCOUNTER — Ambulatory Visit (HOSPITAL_BASED_OUTPATIENT_CLINIC_OR_DEPARTMENT_OTHER)
Admission: RE | Admit: 2022-10-18 | Discharge: 2022-10-18 | Disposition: A | Discharge status: Home / Self Care | Payer: 59 | Attending: Obstetrics | Admitting: Obstetrics

## 2022-10-18 ENCOUNTER — Ambulatory Visit (HOSPITAL_BASED_OUTPATIENT_CLINIC_OR_DEPARTMENT_OTHER): Payer: 59 | Admitting: Anesthesiology

## 2022-10-18 ENCOUNTER — Encounter (HOSPITAL_BASED_OUTPATIENT_CLINIC_OR_DEPARTMENT_OTHER): Payer: Self-pay | Admitting: Obstetrics

## 2022-10-18 ENCOUNTER — Ambulatory Visit: Payer: 59 | Admitting: Surgery

## 2022-10-18 DIAGNOSIS — I10 Essential (primary) hypertension: Secondary | ICD-10-CM | POA: Diagnosis not present

## 2022-10-18 DIAGNOSIS — M1990 Unspecified osteoarthritis, unspecified site: Secondary | ICD-10-CM | POA: Insufficient documentation

## 2022-10-18 DIAGNOSIS — J45909 Unspecified asthma, uncomplicated: Secondary | ICD-10-CM | POA: Diagnosis not present

## 2022-10-18 DIAGNOSIS — N950 Postmenopausal bleeding: Secondary | ICD-10-CM | POA: Insufficient documentation

## 2022-10-18 DIAGNOSIS — Z87891 Personal history of nicotine dependence: Secondary | ICD-10-CM | POA: Diagnosis not present

## 2022-10-18 DIAGNOSIS — Z01818 Encounter for other preprocedural examination: Secondary | ICD-10-CM

## 2022-10-18 DIAGNOSIS — N840 Polyp of corpus uteri: Secondary | ICD-10-CM | POA: Diagnosis not present

## 2022-10-18 DIAGNOSIS — I340 Nonrheumatic mitral (valve) insufficiency: Secondary | ICD-10-CM | POA: Insufficient documentation

## 2022-10-18 HISTORY — DX: Polyp of corpus uteri: N84.0

## 2022-10-18 HISTORY — DX: Prediabetes: R73.03

## 2022-10-18 HISTORY — DX: Personal history of other diseases of the circulatory system: Z86.79

## 2022-10-18 HISTORY — DX: Disorder of the autonomic nervous system, unspecified: G90.9

## 2022-10-18 HISTORY — PX: DILATATION & CURETTAGE/HYSTEROSCOPY WITH MYOSURE: SHX6511

## 2022-10-18 LAB — TYPE AND SCREEN
ABO/RH(D): A POS
Antibody Screen: NEGATIVE

## 2022-10-18 LAB — CBC
HCT: 41.5 % (ref 36.0–46.0)
Hemoglobin: 13.6 g/dL (ref 12.0–15.0)
MCH: 30.1 pg (ref 26.0–34.0)
MCHC: 32.8 g/dL (ref 30.0–36.0)
MCV: 91.8 fL (ref 80.0–100.0)
Platelets: 255 10*3/uL (ref 150–400)
RBC: 4.52 MIL/uL (ref 3.87–5.11)
RDW: 13.3 % (ref 11.5–15.5)
WBC: 6.2 10*3/uL (ref 4.0–10.5)
nRBC: 0 % (ref 0.0–0.2)

## 2022-10-18 LAB — GLUCOSE, CAPILLARY: Glucose-Capillary: 138 mg/dL — ABNORMAL HIGH (ref 70–99)

## 2022-10-18 LAB — POCT I-STAT, CHEM 8
BUN: 11 mg/dL (ref 6–20)
Calcium, Ion: 1.17 mmol/L (ref 1.15–1.40)
Chloride: 105 mmol/L (ref 98–111)
Creatinine, Ser: 0.7 mg/dL (ref 0.44–1.00)
Glucose, Bld: 92 mg/dL (ref 70–99)
HCT: 38 % (ref 36.0–46.0)
Hemoglobin: 12.9 g/dL (ref 12.0–15.0)
Potassium: 3.6 mmol/L (ref 3.5–5.1)
Sodium: 141 mmol/L (ref 135–145)
TCO2: 26 mmol/L (ref 22–32)

## 2022-10-18 SURGERY — DILATATION & CURETTAGE/HYSTEROSCOPY WITH MYOSURE
Anesthesia: General | Site: Vagina

## 2022-10-18 MED ORDER — OXYCODONE HCL 5 MG/5ML PO SOLN: 5.0000 mg | Freq: Once | ORAL | Status: DC | PRN

## 2022-10-18 MED ORDER — AMISULPRIDE (ANTIEMETIC) 5 MG/2ML IV SOLN
INTRAVENOUS | Status: AC
  Filled 2022-10-18: qty 2

## 2022-10-18 MED ORDER — IBUPROFEN 800 MG PO TABS
800.0000 mg | ORAL_TABLET | Freq: Three times a day (TID) | ORAL | 1 refills | Status: DC | PRN
  Filled 2022-10-18: qty 60, 20d supply, fill #0

## 2022-10-18 MED ORDER — HYDROMORPHONE HCL 1 MG/ML IJ SOLN
0.2500 mg | INTRAMUSCULAR | Status: DC | PRN
  Administered 2022-10-18 (×2): 0.25 mg via INTRAVENOUS

## 2022-10-18 MED ORDER — CEFAZOLIN SODIUM-DEXTROSE 2-4 GM/100ML-% IV SOLN
2.0000 g | INTRAVENOUS | Status: AC
  Administered 2022-10-18: 2 g via INTRAVENOUS

## 2022-10-18 MED ORDER — MEPERIDINE HCL 25 MG/ML IJ SOLN: 6.2500 mg | INTRAMUSCULAR | Status: DC | PRN

## 2022-10-18 MED ORDER — KETOROLAC TROMETHAMINE 30 MG/ML IJ SOLN
INTRAMUSCULAR | Status: DC | PRN
  Administered 2022-10-18: 30 mg via INTRAVENOUS

## 2022-10-18 MED ORDER — ACETAMINOPHEN 500 MG PO TABS
1000.0000 mg | ORAL_TABLET | Freq: Once | ORAL | Status: AC
  Administered 2022-10-18: 1000 mg via ORAL

## 2022-10-18 MED ORDER — DEXMEDETOMIDINE HCL IN NACL 80 MCG/20ML IV SOLN
INTRAVENOUS | Status: DC | PRN
  Administered 2022-10-18 (×2): 4 ug via INTRAVENOUS

## 2022-10-18 MED ORDER — ACETAMINOPHEN 500 MG PO TABS
ORAL_TABLET | ORAL | Status: AC
  Filled 2022-10-18: qty 2

## 2022-10-18 MED ORDER — DEXAMETHASONE SODIUM PHOSPHATE 10 MG/ML IJ SOLN
INTRAMUSCULAR | Status: AC
  Filled 2022-10-18: qty 1

## 2022-10-18 MED ORDER — FENTANYL CITRATE (PF) 100 MCG/2ML IJ SOLN
INTRAMUSCULAR | Status: AC
  Filled 2022-10-18: qty 2

## 2022-10-18 MED ORDER — LIDOCAINE HCL (PF) 2 % IJ SOLN
INTRAMUSCULAR | Status: AC
  Filled 2022-10-18: qty 5

## 2022-10-18 MED ORDER — PHENYLEPHRINE 80 MCG/ML (10ML) SYRINGE FOR IV PUSH (FOR BLOOD PRESSURE SUPPORT)
PREFILLED_SYRINGE | INTRAVENOUS | Status: AC
  Filled 2022-10-18: qty 10

## 2022-10-18 MED ORDER — KETOROLAC TROMETHAMINE 30 MG/ML IJ SOLN
INTRAMUSCULAR | Status: AC
  Filled 2022-10-18: qty 1

## 2022-10-18 MED ORDER — CYCLOBENZAPRINE HCL 10 MG PO TABS
10.0000 mg | ORAL_TABLET | Freq: Three times a day (TID) | ORAL | 0 refills | Status: DC | PRN
  Filled 2022-10-18: qty 30, 10d supply, fill #0

## 2022-10-18 MED ORDER — KETOROLAC TROMETHAMINE 30 MG/ML IJ SOLN: 30.0000 mg | Freq: Once | INTRAMUSCULAR | Status: DC | PRN

## 2022-10-18 MED ORDER — LACTATED RINGERS IV SOLN: INTRAVENOUS | Status: DC

## 2022-10-18 MED ORDER — HYDROMORPHONE HCL 1 MG/ML IJ SOLN
INTRAMUSCULAR | Status: AC
  Filled 2022-10-18: qty 1

## 2022-10-18 MED ORDER — PROPOFOL 10 MG/ML IV BOLUS
INTRAVENOUS | Status: DC | PRN
  Administered 2022-10-18: 200 mg via INTRAVENOUS

## 2022-10-18 MED ORDER — POVIDONE-IODINE 10 % EX SWAB: 2.0000 | Freq: Once | CUTANEOUS | Status: DC

## 2022-10-18 MED ORDER — ONDANSETRON HCL 4 MG/2ML IJ SOLN
INTRAMUSCULAR | Status: AC
  Filled 2022-10-18: qty 2

## 2022-10-18 MED ORDER — SODIUM CHLORIDE 0.9 % IR SOLN
Status: DC | PRN
  Administered 2022-10-18: 3000 mL

## 2022-10-18 MED ORDER — ONDANSETRON HCL 4 MG/2ML IJ SOLN
INTRAMUSCULAR | Status: DC | PRN
  Administered 2022-10-18: 4 mg via INTRAVENOUS

## 2022-10-18 MED ORDER — OXYCODONE HCL 5 MG PO TABS: 5.0000 mg | ORAL_TABLET | Freq: Once | ORAL | Status: DC | PRN

## 2022-10-18 MED ORDER — MIDAZOLAM HCL 2 MG/2ML IJ SOLN
INTRAMUSCULAR | Status: AC
  Filled 2022-10-18: qty 2

## 2022-10-18 MED ORDER — ONDANSETRON HCL 4 MG/2ML IJ SOLN: 4.0000 mg | Freq: Once | INTRAMUSCULAR | Status: DC

## 2022-10-18 MED ORDER — FENTANYL CITRATE (PF) 100 MCG/2ML IJ SOLN
INTRAMUSCULAR | Status: DC | PRN
  Administered 2022-10-18: 100 ug via INTRAVENOUS

## 2022-10-18 MED ORDER — CEFAZOLIN SODIUM-DEXTROSE 2-4 GM/100ML-% IV SOLN
INTRAVENOUS | Status: AC
  Filled 2022-10-18: qty 100

## 2022-10-18 MED ORDER — MIDAZOLAM HCL 5 MG/5ML IJ SOLN
INTRAMUSCULAR | Status: DC | PRN
  Administered 2022-10-18: 2 mg via INTRAVENOUS

## 2022-10-18 MED ORDER — PROPOFOL 10 MG/ML IV BOLUS
INTRAVENOUS | Status: AC
  Filled 2022-10-18: qty 20

## 2022-10-18 MED ORDER — ACETAMINOPHEN 500 MG PO TABS
1000.0000 mg | ORAL_TABLET | Freq: Four times a day (QID) | ORAL | 1 refills | Status: DC | PRN
  Filled 2022-10-18: qty 100, 13d supply, fill #0

## 2022-10-18 MED ORDER — AMISULPRIDE (ANTIEMETIC) 5 MG/2ML IV SOLN
10.0000 mg | Freq: Once | INTRAVENOUS | Status: AC | PRN
  Administered 2022-10-18: 10 mg via INTRAVENOUS

## 2022-10-18 MED ORDER — ONDANSETRON HCL 4 MG/2ML IJ SOLN
4.0000 mg | Freq: Once | INTRAMUSCULAR | Status: AC | PRN
  Administered 2022-10-18: 4 mg via INTRAVENOUS

## 2022-10-18 MED ORDER — VASOPRESSIN 20 UNIT/ML IV SOLN
INTRAVENOUS | Status: DC | PRN
  Administered 2022-10-18: 10 mL

## 2022-10-18 MED ORDER — LIDOCAINE 2% (20 MG/ML) 5 ML SYRINGE
INTRAMUSCULAR | Status: DC | PRN
  Administered 2022-10-18: 60 mg via INTRAVENOUS

## 2022-10-18 MED ORDER — DEXAMETHASONE SODIUM PHOSPHATE 10 MG/ML IJ SOLN
INTRAMUSCULAR | Status: DC | PRN
  Administered 2022-10-18: 10 mg via INTRAVENOUS

## 2022-10-18 SURGICAL SUPPLY — 18 items
DEVICE MYOSURE LITE (MISCELLANEOUS) IMPLANT
GAUZE 4X4 16PLY ~~LOC~~+RFID DBL (SPONGE) ×1 IMPLANT
GLOVE BIO SURGEON STRL SZ 6.5 (GLOVE) ×1 IMPLANT
GLOVE BIOGEL PI IND STRL 7.0 (GLOVE) ×2 IMPLANT
GLOVE SURG SYN 6.5 ES PF (GLOVE) ×1 IMPLANT
GLOVE SURG SYN 6.5 PF PI (GLOVE) IMPLANT
GOWN STRL REUS W/ TWL LRG LVL3 (GOWN DISPOSABLE) IMPLANT
GOWN STRL REUS W/TWL LRG LVL3 (GOWN DISPOSABLE) ×3 IMPLANT
KIT PROCEDURE FLUENT (KITS) ×1 IMPLANT
KIT TURNOVER CYSTO (KITS) ×1 IMPLANT
NDL HYPO 18GX1.5 BLUNT FILL (NEEDLE) IMPLANT
NEEDLE HYPO 18GX1.5 BLUNT FILL (NEEDLE) ×1 IMPLANT
PACK VAGINAL MINOR WOMEN LF (CUSTOM PROCEDURE TRAY) ×1 IMPLANT
PAD OB MATERNITY 4.3X12.25 (PERSONAL CARE ITEMS) ×1 IMPLANT
SEAL ROD LENS SCOPE MYOSURE (ABLATOR) ×1 IMPLANT
SLEEVE SCD COMPRESS KNEE MED (STOCKING) ×1 IMPLANT
SOL PREP POV-IOD 4OZ 10% (MISCELLANEOUS) IMPLANT
SYR TB 1ML LL NO SAFETY (SYRINGE) IMPLANT

## 2022-10-18 NOTE — Anesthesia Preprocedure Evaluation (Addendum)
Anesthesia Evaluation  Patient identified by MRN, date of birth, ID band Patient awake    Reviewed: Allergy & Precautions, NPO status , Patient's Chart, lab work & pertinent test results  Airway Mallampati: II  TM Distance: >3 FB Neck ROM: Full    Dental no notable dental hx. (+) Teeth Intact, Dental Advisory Given   Pulmonary asthma , former smoker Quit smoking 2006, 50 pack year history    Pulmonary exam normal breath sounds clear to auscultation       Cardiovascular hypertension (no home meds), Normal cardiovascular exam+ Valvular Problems/Murmurs (mild MR) MR  Rhythm:Regular Rate:Normal  Echo 2019 - Left ventricle: The cavity size was normal. There was mild    concentric hypertrophy. Systolic function was normal. The    estimated ejection fraction was in the range of 60% to 65%. Wall    motion was normal; there were no regional wall motion    abnormalities. Left ventricular diastolic function parameters    were normal.  - Aorta: Ascending aortic diameter: 39 mm (S).  - Mitral valve: There was mild regurgitation.   2021 neg stress test   06/2018 CTA Chest: 4.1 x 4.0 cm fusiform Asc Ao aneurysm-unchanged. Descending thoracic aorta 2.1 cm.   Neuro/Psych negative neurological ROS  negative psych ROS   GI/Hepatic negative GI ROS, Neg liver ROS,,,  Endo/Other  negative endocrine ROS    Renal/GU negative Renal ROS  negative genitourinary   Musculoskeletal  (+) Arthritis , Osteoarthritis,    Abdominal   Peds  Hematology negative hematology ROS (+)   Anesthesia Other Findings   Reproductive/Obstetrics negative OB ROS                             Anesthesia Physical Anesthesia Plan  ASA: 2  Anesthesia Plan: General   Post-op Pain Management: Tylenol PO (pre-op)* and Toradol IV (intra-op)*   Induction: Intravenous  PONV Risk Score and Plan: 3 and Ondansetron, Dexamethasone, Midazolam  and Treatment may vary due to age or medical condition  Airway Management Planned: LMA  Additional Equipment: None  Intra-op Plan:   Post-operative Plan: Extubation in OR  Informed Consent: I have reviewed the patients History and Physical, chart, labs and discussed the procedure including the risks, benefits and alternatives for the proposed anesthesia with the patient or authorized representative who has indicated his/her understanding and acceptance.     Dental advisory given  Plan Discussed with: CRNA  Anesthesia Plan Comments:        Anesthesia Quick Evaluation

## 2022-10-18 NOTE — Anesthesia Procedure Notes (Signed)
Procedure Name: LMA Insertion Date/Time: 10/18/2022 1:35 PM  Performed by: Dorean Hiebert D, CRNAPre-anesthesia Checklist: Patient identified, Emergency Drugs available, Suction available and Patient being monitored Patient Re-evaluated:Patient Re-evaluated prior to induction Oxygen Delivery Method: Circle system utilized Preoxygenation: Pre-oxygenation with 100% oxygen Induction Type: IV induction Ventilation: Mask ventilation without difficulty LMA: LMA inserted LMA Size: 4.0 Tube type: Oral Number of attempts: 1 Placement Confirmation: positive ETCO2 and breath sounds checked- equal and bilateral Tube secured with: Tape Dental Injury: Teeth and Oropharynx as per pre-operative assessment

## 2022-10-18 NOTE — Brief Op Note (Signed)
10/18/2022  2:07 PM  PATIENT:  Jodelle Gross Anstine  57 y.o. adult  PRE-OPERATIVE DIAGNOSIS:  Endometrial Polyp  POST-OPERATIVE DIAGNOSIS:  Endometrial Polyp  PROCEDURE:  Procedure(s): DILATATION & CURETTAGE/HYSTEROSCOPY WITH POLYPECTOMY BY MYOSURE (N/A)  SURGEON:  Surgeon(s) and Role:    Noland Fordyce, MD - Primary  PHYSICIAN ASSISTANT: na  ASSISTANTS: none   ANESTHESIA:   local and general  EBL:  <20cc   BLOOD ADMINISTERED:none  DRAINS: none   LOCAL MEDICATIONS USED: 10 cc quarter percent MARCAINE mixed with 60 minutes vasopressin    SPECIMEN:  Source of Specimen:  Endometrial polyp  DISPOSITION OF SPECIMEN:  PATHOLOGY  COUNTS:  YES  TOURNIQUET:  * No tourniquets in log *  DICTATION: .Note written in EPIC  PLAN OF CARE: Discharge to home after PACU  PATIENT DISPOSITION:  PACU - hemodynamically stable.   Delay start of Pharmacological VTE agent (>24hrs) due to surgical blood loss or risk of bleeding: yes

## 2022-10-18 NOTE — Transfer of Care (Signed)
Immediate Anesthesia Transfer of Care Note  Patient: Brittany Ochoa  Procedure(s) Performed: DILATATION & CURETTAGE/HYSTEROSCOPY WITH POLYPECTOMY BY MYOSURE (Vagina )  Patient Location: PACU  Anesthesia Type:General  Level of Consciousness: awake, alert , and oriented  Airway & Oxygen Therapy: Patient Spontanous Breathing and Patient connected to nasal cannula oxygen  Post-op Assessment: Report given to RN and Post -op Vital signs reviewed and stable  Post vital signs: Reviewed and stable  Last Vitals:  Vitals Value Taken Time  BP 113/75 10/18/22 1409  Temp    Pulse 64 10/18/22 1412  Resp 15 10/18/22 1412  SpO2 100 % 10/18/22 1412  Vitals shown include unvalidated device data.  Last Pain:  Vitals:   10/18/22 1218  TempSrc: Oral  PainSc: 0-No pain      Patients Stated Pain Goal: 4 (10/18/22 1218)  Complications: No notable events documented.

## 2022-10-18 NOTE — H&P (Signed)
Chief complaint, endometrial polyp  History present illness 57 year old G0 who presented in April with bright red vaginal bleeding after no prior bleeding since 2019 when she went into menopause.   Ultrasound April 24 which showed uterus 7.8 x 5.7 x 5 cm with 6.2 mm endometrial stripe no free fluid normal bilateral ovaries several fibroids noted intramurally 1.8, 2.4, 2.5 cm and inhomogenous uterus.    F/u sonohysterogram which showed 2 soft tissue masses consistent with polyps 2 x 1.8 x 0.6 cm attached fundally and a second polyp 1.8 x 1.2 x 0.6 cm attached to the anterior lower uterine segment.  The endometrium was otherwise thin  Endometrial biopsy April 2024 showed disordered proliferative endometrium  Incidentally patient has not had any vaginal bleeding since her diagnosis.  Past Medical History:  Diagnosis Date   Allergy 2015   seasonal   Arthritis    Ascending aortic aneurysm (HCC)    a. 06/2018 CTA Chest: 4.1 x 4.0 cm fusiform Asc Ao aneurysm-unchanged.  Descending thoracic aorta 2.1 cm.   Asthma    exercised induced   Autonomic dysfunction with vaccines    Dyspnea on exertion 2019   a. 06/2017 echo: EF 60 to 65%.  No regional wall motion abnormalities.  Ascending aortic diameter 3.9 cm.  Mild MR.   Endometrial polyp    Gastric erythema    History of hypertension no medications since 2020    Lipoma    of neck and chest   Multiple thyroid nodules    OCD (obsessive compulsive disorder)    Pre-diabetes    Reflux esophagitis    Past Surgical History:  Procedure Laterality Date   colonscopy  12/16/2021   endoscopy done also   COSMETIC SURGERY  1997   breast reduction   ESOPHAGOGASTRODUODENOSCOPY (EGD) WITH PROPOFOL N/A 01/24/2018   Procedure: ESOPHAGOGASTRODUODENOSCOPY (EGD) WITH PROPOFOL;  Surgeon: Pasty Spillers, MD;  Location: [facility] ENDOSCOPY;  Service: Endoscopy;  Laterality: N/A;   ESOPHAGOGASTRODUODENOSCOPY (EGD) WITH PROPOFOL N/A 09/09/2020   Procedure:  ESOPHAGOGASTRODUODENOSCOPY (EGD) WITH PROPOFOL;  Surgeon: Pasty Spillers, MD;  Location: [facility] ENDOSCOPY;  Service: Endoscopy;  Laterality: N/A;   ESOPHAGOGASTRODUODENOSCOPY (EGD) WITH PROPOFOL N/A 02/17/2021   Procedure: ESOPHAGOGASTRODUODENOSCOPY (EGD) WITH PROPOFOL;  Surgeon: Pasty Spillers, MD;  Location: [facility] ENDOSCOPY;  Service: Endoscopy;  Laterality: N/A;   lipoma removal  2007   back   REDUCTION MAMMAPLASTY Bilateral 12/01/1996   Allergies: Codeine causes anaphylaxis  PE: Vitals:   10/09/22 0919 10/18/22 1218  BP:  127/83  Pulse:  77  Resp:  17  Temp:  98.3 F (36.8 C)  TempSrc:  Oral  SpO2:  99%  Weight: 74.8 kg 78 kg  Height: 5\' 4"  (1.626 m) 5\' 4"  (1.626 m)   General: Well-appearing, no distress Skin: Warm and dry Abdomen: Nontender GU deferred to OR  CBC    Component Value Date/Time   WBC 6.2 10/18/2022 1157   RBC 4.52 10/18/2022 1157   HGB 12.9 10/18/2022 1210   HGB 13.6 06/28/2022 1549   HCT 38.0 10/18/2022 1210   HCT 39.8 06/28/2022 1549   PLT 255 10/18/2022 1157   PLT 249 06/28/2022 1549   MCV 91.8 10/18/2022 1157   MCV 89 06/28/2022 1549   MCH 30.1 10/18/2022 1157   MCHC 32.8 10/18/2022 1157   RDW 13.3 10/18/2022 1157   RDW 13.2 06/28/2022 1549   LYMPHSABS 2.0 06/28/2022 1549   MONOABS 0.4 11/04/2021 1658   EOSABS 0.1 06/08/2022 1012   BASOSABS 0.0  06/08/2022 1012   Assessment and plan: 57 year old G0 menopausal patient with single episode postmenopausal bleeding due to 2 cavitary endometrial polyps here for resection of endometrial polyps.  Risk benefits discussed with patient who agrees to proceed.  Patient consents to blood if needed.

## 2022-10-18 NOTE — Op Note (Signed)
10/18/2022  2:07 PM  PATIENT:  Brittany Ochoa  57 y.o. adult  PRE-OPERATIVE DIAGNOSIS:  Endometrial Polyp  POST-OPERATIVE DIAGNOSIS:  Endometrial Polyp  PROCEDURE:  Procedure(s): DILATATION & CURETTAGE/HYSTEROSCOPY WITH POLYPECTOMY BY MYOSURE (N/A)  SURGEON:  Surgeon(s) and Role:    Noland Fordyce, MD - Primary  PHYSICIAN ASSISTANT: na  ASSISTANTS: none   ANESTHESIA:   local and general  EBL:  <20cc   BLOOD ADMINISTERED:none  DRAINS: none   LOCAL MEDICATIONS USED: 10 cc quarter percent MARCAINE mixed with 60 minutes vasopressin    SPECIMEN:  Source of Specimen:  Endometrial polyp  DISPOSITION OF SPECIMEN:  PATHOLOGY  COUNTS:  YES  TOURNIQUET:  * No tourniquets in log *  DICTATION: .Note written in EPIC  PLAN OF CARE: Discharge to home after PACU  PATIENT DISPOSITION:  PACU - hemodynamically stable.   Delay start of Pharmacological VTE agent (>24hrs) due to surgical blood loss or risk of bleeding: yes    Abx: 2 g Ancef   Findings:  visualization of bilateral ostia, partial uterine septum versus arcuate uterus, 2 endometrial polyps greater than 1 cm, hemostasis post-procedure  Indications: Endometrial polyp, postmenopausal bleeding   After informed consent including discussion of risks of bleeding, infection, perforation,  the patient was taken to the operating room where general anesthesia was initiated without difficulty. She was prepped and draped in normal sterile fashion in the dorsal supine lithotomy position.  A bimanual examination was done to assess the size and position of the uterus. A speculum was placed in the vagina and single tooth tenaculum used to grasp the anterior lip of the cervix. Local anaesthetic with vasopressin was injected at 5 and 7 o'clock in there cervico-paracervical junction.   The cervix was then serially dilated to a #17 Pratt dilator. The hysteroscope was inserted under direct visualization. Survey of the endometrium/ pathology  with findings as above. The MyoSure lite blade was then placed through the operating channel, suction was applied and the 2 polyps were easily resected.  Bilateral ostia were visualized.  It appeared there may be arcuate shaped uterus or possible small septum which was not addressed due to the postmenopausal stage of life.  The endometrium otherwise remained thin.  Hemostasis was noted.  The hysteroscope was then removed. Tenaculum was removed. The tenaculum site was hemostatic and the case was terminated. The patient tolerated the procedure well. Sponge, lap and needle counts were correct and the patient was taken to the recovery room in stable condition.   Lendon Colonel 10/18/2022 2:08 PM

## 2022-10-18 NOTE — OR Nursing (Signed)
Patient was assisted to phase 2 and felt sleepy and nauseaus.  Patient was given iv medications for nausea and alloud to rest and received iv fluids.  At time of discharge patient was much more awake alert and oriented x 4 skin warm dry and pink resp even and unlabored. Patient discharged. Margarita Mail rn

## 2022-10-18 NOTE — Anesthesia Postprocedure Evaluation (Signed)
Anesthesia Post Note  Patient: Brittany Ochoa  Procedure(s) Performed: DILATATION & CURETTAGE/HYSTEROSCOPY WITH POLYPECTOMY BY MYOSURE (Vagina )     Patient location during evaluation: PACU Anesthesia Type: General Level of consciousness: awake and alert, oriented and patient cooperative Pain management: pain level controlled Vital Signs Assessment: post-procedure vital signs reviewed and stable Respiratory status: spontaneous breathing, nonlabored ventilation and respiratory function stable Cardiovascular status: blood pressure returned to baseline and stable Postop Assessment: no apparent nausea or vomiting Anesthetic complications: no   No notable events documented.  Last Vitals:  Vitals:   10/18/22 1430 10/18/22 1445  BP: 119/76 114/69  Pulse:  (!) 58  Resp:  10  Temp:    SpO2:  98%    Last Pain:  Vitals:   10/18/22 1445  TempSrc:   PainSc: 3                  Lannie Fields

## 2022-10-18 NOTE — Discharge Instructions (Addendum)
No acetaminophen/Tylenol until after 5:50pm today if needed for pain.   No ibuprofen, Advil, Aleve, Motrin, ketorolac, meloxicam, naproxen, or other NSAIDS until after 8:00pm today if needed for pain.   DISCHARGE INSTRUCTIONS: HYSTEROSCOPY  The following instructions have been prepared to help you care for yourself upon your return home.    Personal hygiene:  Use sanitary pads for vaginal drainage, not tampons.  Shower the day after your procedure.  NO tub baths, pools or Jacuzzis for 2-3 weeks.  Wipe front to back after using the bathroom.  Activity and limitations:  Do NOT drive or operate any equipment for 24 hours. The effects of anesthesia are still present and drowsiness may result.  Do NOT rest in bed all day.  Walking is encouraged.  Walk up and down stairs slowly.  You may resume your normal activity in one to two days or as indicated by your physician. Sexual activity: NO intercourse for at least 2 weeks after the procedure, or as indicated by your Doctor.  Diet: Eat a light meal as desired this evening. You may resume your usual diet tomorrow.  Return to Work: You may resume your work activities in one to two days or as indicated by Therapist, sports.  What to expect after your surgery: Expect to have vaginal bleeding/discharge for 2-3 days and spotting for up to 10 days. It is not unusual to have soreness for up to 1-2 weeks. You may have a slight burning sensation when you urinate for the first day. Mild cramps may continue for a couple of days. You may have a regular period in 2-6 weeks.  Call your doctor for any of the following:  Excessive vaginal bleeding or clotting, saturating and changing one pad every hour.  Inability to urinate 6 hours after discharge from hospital.  Pain not relieved by pain medication.  Fever of 100.4 F or greater.  Unusual vaginal discharge or odor.       Post Anesthesia Home Care Instructions  Activity: Get plenty of rest for  the remainder of the day. A responsible individual must stay with you for 24 hours following the procedure.  For the next 24 hours, DO NOT: -Drive a car -Advertising copywriter -Drink alcoholic beverages -Take any medication unless instructed by your physician -Make any legal decisions or sign important papers.  Meals: Start with liquid foods such as gelatin or soup. Progress to regular foods as tolerated. Avoid greasy, spicy, heavy foods. If nausea and/or vomiting occur, drink only clear liquids until the nausea and/or vomiting subsides. Call your physician if vomiting continues.  Special Instructions/Symptoms: Your throat may feel dry or sore from the anesthesia or the breathing tube placed in your throat during surgery. If this causes discomfort, gargle with warm salt water. The discomfort should disappear within 24 hours.

## 2022-10-20 ENCOUNTER — Encounter (HOSPITAL_BASED_OUTPATIENT_CLINIC_OR_DEPARTMENT_OTHER): Payer: Self-pay | Admitting: Obstetrics

## 2022-10-20 LAB — SURGICAL PATHOLOGY

## 2022-10-27 DIAGNOSIS — H189 Unspecified disorder of cornea: Secondary | ICD-10-CM | POA: Diagnosis not present

## 2022-10-31 ENCOUNTER — Other Ambulatory Visit: Payer: Self-pay

## 2022-11-07 ENCOUNTER — Encounter: Payer: Self-pay | Admitting: Cardiovascular Disease

## 2022-11-07 ENCOUNTER — Ambulatory Visit: Payer: 59 | Attending: Cardiovascular Disease | Admitting: Cardiovascular Disease

## 2022-11-07 VITALS — BP 130/74 | HR 71 | Ht 64.0 in | Wt 172.6 lb

## 2022-11-07 DIAGNOSIS — R002 Palpitations: Secondary | ICD-10-CM | POA: Diagnosis not present

## 2022-11-07 DIAGNOSIS — I10 Essential (primary) hypertension: Secondary | ICD-10-CM

## 2022-11-07 DIAGNOSIS — I3139 Other pericardial effusion (noninflammatory): Secondary | ICD-10-CM

## 2022-11-07 DIAGNOSIS — I7121 Aneurysm of the ascending aorta, without rupture: Secondary | ICD-10-CM | POA: Diagnosis not present

## 2022-11-07 NOTE — Patient Instructions (Signed)
Medication Instructions:  No changes *If you need a refill on your cardiac medications before your next appointment, please call your pharmacy*   Lab Work: None ordered If you have labs (blood work) drawn today and your tests are completely normal, you will receive your results only by: [place] (if you have MyChart) OR A paper copy in the mail If you have any lab test that is abnormal or we need to change your treatment, we will call you to review the results.   Testing/Procedures: Your physician has requested that you have an echocardiogram. Echocardiography is a painless test that uses sound waves to create images of your heart. It provides your doctor with information about the size and shape of your heart and how well your heart's chambers and valves are working.   You may receive an ultrasound enhancing agent through an IV if needed to better visualize your heart during the echo. This procedure takes approximately one hour.  There are no restrictions for this procedure.  This will take place at [facility] ([place]) [id number], [place]    Follow-Up: At [facility], you and your health needs are our priority.  As part of our continuing mission to provide you with exceptional heart care, we have created designated Provider Care Teams.  These Care Teams include your primary Cardiologist (physician) and Advanced Practice Providers (APPs -  Physician Assistants and Nurse Practitioners) who all work together to provide you with the care you need, when you need it.  We recommend signing up for the patient portal called "MyChart".  Sign up information is provided on this After Visit Summary.  MyChart is used to connect with patients for Virtual Visits (Telemedicine).  Patients are able to view lab/test results, encounter notes, upcoming appointments, etc.  Non-urgent messages can be sent to your provider as well.   To learn more about what you can  do with MyChart, go to [email or web address].    Your next appointment:   12 month(s)  Provider:   You may see Kathlyn Sacramento, MD or one of the following Advanced Practice Providers on your designated Care Team:   Murray Hodgkins, NP Christell Faith, PA-C Cadence Kathlen Mody, PA-C Gerrie Nordmann, NP

## 2022-11-07 NOTE — Progress Notes (Signed)
Cardiology Office Note   Date:  11/07/2022   ID:  SHENEA GIACOBBE, DOB 08-06-1965, MRN [id number]  PCP:  Dorcas Carrow, DO  Cardiologist:   Lorine Bears, MD   Chief Complaint  Patient presents with   Follow-up    Patient reports new symptoms of dizziness with exercising.  Concerned of Angio CT results obtained on 6/26      History of Present Illness: Brittany Ochoa is a 57 y.o. adult who is here today for follow-up visit regarding palpitations and essential hypertension.  She is a previous smoker but quit in 2006.  She is known to have small ascending aortic aneurysm measuring 4.1 x 4 cm.  She sees Dr. Laneta Simmers on a yearly basis.  She had previous palpitation.  ZIO monitor in September 2020 showed normal sinus rhythm with isolated PACs.  Some of her events correlated with PACs but most of the events did not correlate with arrhythmia.  She had COVID-19 infection in December 2020.  She had chest pain in 2021 and thus underwent cardiac CTA which showed normal coronary arteries with calcium score of 0.  She had elevated blood pressure with negative work-up for secondary hypertension.  She had CTA of the chest in June which showed stable size ascending aortic aneurysm at 4.1 cm.  She was noted to have small pericardial effusion which was new.  She complains of dizziness with exercise but no syncope or presyncope.  No chest pain or shortness of breath.  Past Medical History:  Diagnosis Date   Allergy 2015   seasonal   Arthritis    Ascending aortic aneurysm (HCC)    a. 06/2018 CTA Chest: 4.1 x 4.0 cm fusiform Asc Ao aneurysm-unchanged.  Descending thoracic aorta 2.1 cm.   Asthma    exercised induced   Autonomic dysfunction with vaccines    Dyspnea on exertion 2019   a. 06/2017 echo: EF 60 to 65%.  No regional wall motion abnormalities.  Ascending aortic diameter 3.9 cm.  Mild MR.   Endometrial polyp    Gastric erythema    History of hypertension no medications since 2020     Lipoma    of neck and chest   Multiple thyroid nodules    OCD (obsessive compulsive disorder)    Pre-diabetes    Reflux esophagitis     Past Surgical History:  Procedure Laterality Date   colonscopy  12/16/2021   endoscopy done also   COSMETIC SURGERY  1997   breast reduction   DILATATION & CURETTAGE/HYSTEROSCOPY WITH MYOSURE N/A 10/18/2022   Procedure: DILATATION & CURETTAGE/HYSTEROSCOPY WITH POLYPECTOMY BY MYOSURE;  Surgeon: Noland Fordyce, MD;  Location: [facility];  Service: Gynecology;  Laterality: N/A;   ESOPHAGOGASTRODUODENOSCOPY (EGD) WITH PROPOFOL N/A 01/24/2018   Procedure: ESOPHAGOGASTRODUODENOSCOPY (EGD) WITH PROPOFOL;  Surgeon: Pasty Spillers, MD;  Location: [facility];  Service: Endoscopy;  Laterality: N/A;   ESOPHAGOGASTRODUODENOSCOPY (EGD) WITH PROPOFOL N/A 09/09/2020   Procedure: ESOPHAGOGASTRODUODENOSCOPY (EGD) WITH PROPOFOL;  Surgeon: Pasty Spillers, MD;  Location: [facility];  Service: Endoscopy;  Laterality: N/A;   ESOPHAGOGASTRODUODENOSCOPY (EGD) WITH PROPOFOL N/A 02/17/2021   Procedure: ESOPHAGOGASTRODUODENOSCOPY (EGD) WITH PROPOFOL;  Surgeon: Pasty Spillers, MD;  Location: [facility];  Service: Endoscopy;  Laterality: N/A;   lipoma removal  2007   back   REDUCTION MAMMAPLASTY Bilateral 12/01/1996     Current Outpatient Medications  Medication Sig Dispense Refill   estradiol (ESTRACE) 0.1 MG/GM vaginal cream Estrogen Cream Instruction  Discard  applicator  Apply pea sized amount to tip of finger to urethra before bed. Wash hands well after application. Use Monday, Wednesday and Friday 42.5 g 12   metFORMIN (GLUCOPHAGE-XR) 500 MG 24 hr tablet Take 1 tablet (500 mg total) by mouth 2 (two) times daily. 60 tablet 6   acetaminophen (TYLENOL) 500 MG tablet Take 2 tablets (1,000 mg total) by mouth every 6 (six) hours as needed. 100 tablet 1   cyclobenzaprine (FLEXERIL) 10 MG tablet Take 1 tablet (10 mg total) by mouth 3  (three) times daily as needed for muscle spasms. 30 tablet 0   ibuprofen (ADVIL) 800 MG tablet Take 1 tablet (800 mg total) by mouth every 8 (eight) hours as needed. 60 tablet 1   Vitamin D, Ergocalciferol, (DRISDOL) 1.25 MG (50000 UNIT) CAPS capsule Take 1 capsule (50,000 Units total) by mouth every 7 (seven) days. 12 capsule 1   No current facility-administered medications for this visit.    Allergies:   Codeine, Pholcodine, Medical provider ez flu shot [influenza vac split quad], Quinolones, and Tirzepatide    Social History:  The patient  reports that she quit smoking about 18 years ago. Her smoking use included cigarettes. She started smoking about 43 years ago. She has a 50 pack-year smoking history. She has been exposed to tobacco smoke. She has never used smokeless tobacco. She reports that she does not currently use alcohol. She reports that she does not use drugs.   Family History:  The patient's family history includes Alcohol abuse in her maternal uncle; Anxiety disorder in her brother; Arthritis in her maternal grandmother and mother; Asthma in her brother; Breast cancer in her paternal grandmother; Cancer in her paternal aunt, paternal grandfather, and paternal uncle; Diabetes in her father, mother, and paternal aunt; Heart attack in her father; Heart disease in her father, paternal grandfather, and paternal grandmother; Obesity in her mother; Osteoporosis in her maternal grandmother; Stomach cancer in her paternal grandmother; Stomach cancer (age of onset: 38) in her father; Varicose Veins in her maternal grandmother.    ROS:  Please see the history of present illness.   Otherwise, review of systems are positive for none.   All other systems are reviewed and negative.    PHYSICAL EXAM: VS:  BP 130/74 (BP Location: Left Arm, Patient Position: Sitting, Cuff Size: Normal)   Pulse 71   Ht 5\' 4"  (1.626 m)   Wt 172 lb 9.6 oz (78.3 kg)   LMP 06/01/2019   SpO2 98%   BMI 29.63 kg/m  ,  BMI Body mass index is 29.63 kg/m. GEN: Well nourished, well developed, in no acute distress  HEENT: normal  Neck: no JVD, carotid bruits, or masses Cardiac: RRR; no murmurs, rubs, or gallops,no edema  Respiratory:  clear to auscultation bilaterally, normal work of breathing GI: soft, nontender, nondistended, + BS MS: no deformity or atrophy  Skin: warm and dry, no rash Neuro:  Strength and sensation are intact Psych: euthymic mood, full affect   EKG:  EKG is ordered today. The ekg demonstrates normal sinus rhythm .  No significant ST or T wave changes.  Poor R wave progression in the anterior leads.   Recent Labs: 05/25/2022: TSH 0.620 06/28/2022: ALT 32 10/18/2022: BUN 11; Creatinine, Ser 0.70; Hemoglobin 12.9; Platelets 255; Potassium 3.6; Sodium 141    Lipid Panel    Component Value Date/Time   CHOL 249 (H) 05/25/2022 1214   CHOL 180 11/10/2021 1620   TRIG 160 (H) 05/25/2022 1214  HDL 51 05/25/2022 1214   HDL 42 11/10/2021 1620   CHOLHDL 4.9 05/25/2022 1214   VLDL 32 05/25/2022 1214   LDLCALC 166 (H) 05/25/2022 1214   LDLCALC 107 (H) 11/10/2021 1620      Wt Readings from Last 3 Encounters:  11/07/22 172 lb 9.6 oz (78.3 kg)  10/18/22 172 lb (78 kg)  09/14/22 174 lb 3.2 oz (79 kg)         ASSESSMENT AND PLAN:  1.  History of atypical chest pain: Cardiac CTA in 2021 showed normal coronary arteries with calcium score of 0.  No recurrent symptoms.  2.  Palpitations: She had outpatient monitor done twice which showed mild premature beats as well as short runs of SVT.  No significant arrhythmia.   No significant palpitations at this time.  3.  Small ascending aortic aneurysm: Followed by Dr. Laneta Simmers.  This has been stable in size.  4.  Small pericardial effusion: This was noted on CT scan in June.  I requested an echocardiogram to evaluate this especially with her complaints of dizziness with exercise.   Disposition:   FU with me in 12 months  Signed,  Lorine Bears, MD  11/07/2022 4:49 PM    [facility]

## 2022-11-15 ENCOUNTER — Ambulatory Visit (INDEPENDENT_AMBULATORY_CARE_PROVIDER_SITE_OTHER): Payer: 59 | Admitting: Surgery

## 2022-11-15 ENCOUNTER — Encounter: Payer: Self-pay | Admitting: Surgery

## 2022-11-15 VITALS — BP 133/81 | HR 78 | Resp 18 | Ht 64.0 in | Wt 168.0 lb

## 2022-11-15 DIAGNOSIS — I7121 Aneurysm of the ascending aorta, without rupture: Secondary | ICD-10-CM

## 2022-11-16 NOTE — Progress Notes (Signed)
HPI:  The patient is a 57 year old woman who returns for follow-up of a 4.0 cm fusiform ascending aortic aneurysm.  I last saw her on 10/06/2020.  She has continued to feel well overall.  She denies any chest or back pain.  Current Outpatient Medications  Medication Sig Dispense Refill   estradiol (ESTRACE) 0.1 MG/GM vaginal cream Estrogen Cream Instruction  Discard applicator  Apply pea sized amount to tip of finger to urethra before bed. Wash hands well after application. Use Monday, Wednesday and Friday 42.5 g 12   metFORMIN (GLUCOPHAGE-XR) 500 MG 24 hr tablet Take 1 tablet (500 mg total) by mouth 2 (two) times daily. 60 tablet 6   No current facility-administered medications for this visit.     Physical Exam: BP 133/81 (BP Location: Left Arm, Patient Position: Sitting)   Pulse 78   Resp 18   Ht 5\' 4"  (1.626 m)   Wt 168 lb (76.2 kg)   LMP 06/01/2019   SpO2 95% Comment: RA  BMI 28.84 kg/m  She looks well. Cardiac exam shows a regular rate and rhythm with normal heart sounds.  There is no murmur. Lungs are clear.  Diagnostic Tests:  Narrative & Impression  CLINICAL DATA:  Ascending thoracic aortic aneurysm   EXAM: CT ANGIOGRAPHY CHEST WITH CONTRAST   TECHNIQUE: Multidetector CT imaging of the chest was performed using the standard protocol during bolus administration of intravenous contrast. Multiplanar CT image reconstructions and MIPs were obtained to evaluate the vascular anatomy.   RADIATION DOSE REDUCTION: This exam was performed according to the departmental dose-optimization program which includes automated exposure control, adjustment of the mA and/or kV according to patient size and/or use of iterative reconstruction technique.   CONTRAST:  75mL ISOVUE-370 IOPAMIDOL (ISOVUE-370) INJECTION 76%   COMPARISON:  Previous studies including the examination of 10/06/2020   FINDINGS: Cardiovascular: There is homogeneous enhancement in thoracic  aorta. There is aneurysmal dilation in ascending thoracic aorta measuring 4.1 cm. Aortic arch measures 2.8 cm. Descending thoracic aorta measures 2.3 cm. There are no intraluminal filling defects in pulmonary artery branches. There is interval increase in size of small pericardial effusion.   Mediastinum/Nodes: No significant lymphadenopathy is seen. There is inhomogeneous attenuation and thyroid with nodules measuring up to 1.3 cm in diameter in the right lobe. Similar finding was seen in the previous study. No follow-up imaging is recommended.   Lungs/Pleura: There is no focal pulmonary consolidation. Small linear densities are seen in both lower lung fields with no significant interval change, possibly suggesting scarring. There is no focal pulmonary consolidation. There is no pleural effusion or pneumothorax.   Upper Abdomen: There is fatty infiltration in liver. There are coarse calcifications in spleen.   Musculoskeletal: No acute findings are seen.   Review of the MIP images confirms the above findings.   IMPRESSION: There is 4.1 cm aneurysm in ascending thoracic aorta with no significant interval change. Recommend annual imaging followup by CTA or MRA. This recommendation follows 2010 ACCF/AHA/AATS/ACR/ASA/SCA/SCAI/SIR/STS/SVM Guidelines for the Diagnosis and Management of Patients with Thoracic Aortic Disease. Circulation. 2010; 121: Z610-R604. Aortic aneurysm NOS (ICD10-I71.9)   There is no evidence of thoracic aortic dissection. There is no evidence of pulmonary embolism.   Small pericardial effusion is seen with interval increase in size. There is no focal pulmonary consolidation.   Fatty liver.  Other findings as described in the body of the report.     Electronically Signed   By: Harlan Stains.D.  On: 10/11/2022 12:40      Impression:  She has a stable 4.1 cm fusiform ascending aortic aneurysm.  Her descending aorta at the same level measures  2.2 cm.  Her aorta is still well below the usual surgical threshold of 5.5 cm although given the small size of her descending aorta I would probably use a lower threshold.  I reviewed the CT images with her and answered all of her questions.  I stressed the importance of continued good blood pressure control in preventing further enlargement and acute aortic dissection.  I advised her against doing any heavy lifting or strenuous physical activity that may require a Valsalva maneuver and could suddenly raise her blood pressure to high levels.  Plan:  Given the small size of her aneurysm and the stability I think it can be followed up in 2 years.  I spent 10 minutes performing this established patient evaluation and > 50% of this time was spent face to face counseling and coordinating the care of this patient's aortic aneurysm.    Alleen Borne, MD [facility] [phone]

## 2022-11-17 ENCOUNTER — Encounter: Payer: 59 | Admitting: Family Medicine

## 2022-11-22 DIAGNOSIS — D1730 Benign lipomatous neoplasm of skin and subcutaneous tissue of unspecified sites: Secondary | ICD-10-CM | POA: Diagnosis not present

## 2022-11-22 DIAGNOSIS — H903 Sensorineural hearing loss, bilateral: Secondary | ICD-10-CM | POA: Diagnosis not present

## 2022-11-22 DIAGNOSIS — H6123 Impacted cerumen, bilateral: Secondary | ICD-10-CM | POA: Diagnosis not present

## 2022-11-24 ENCOUNTER — Ambulatory Visit (INDEPENDENT_AMBULATORY_CARE_PROVIDER_SITE_OTHER): Payer: 59 | Admitting: Family Medicine

## 2022-11-24 ENCOUNTER — Encounter: Payer: Self-pay | Admitting: Family Medicine

## 2022-11-24 VITALS — BP 137/84 | HR 57 | Temp 98.4°F | Ht 63.78 in | Wt 167.0 lb

## 2022-11-24 DIAGNOSIS — R7301 Impaired fasting glucose: Secondary | ICD-10-CM

## 2022-11-24 DIAGNOSIS — Z0000 Encounter for general adult medical examination without abnormal findings: Secondary | ICD-10-CM

## 2022-11-24 LAB — URINALYSIS, ROUTINE W REFLEX MICROSCOPIC
Bilirubin, UA: NEGATIVE
Glucose, UA: NEGATIVE
Ketones, UA: NEGATIVE
Leukocytes,UA: NEGATIVE
Nitrite, UA: NEGATIVE
Protein,UA: NEGATIVE
RBC, UA: NEGATIVE
Specific Gravity, UA: 1.01 (ref 1.005–1.030)
Urobilinogen, Ur: 0.2 mg/dL (ref 0.2–1.0)
pH, UA: 6.5 (ref 5.0–7.5)

## 2022-11-24 LAB — BAYER DCA HB A1C WAIVED: HB A1C (BAYER DCA - WAIVED): 5.5 % (ref 4.8–5.6)

## 2022-11-24 NOTE — Assessment & Plan Note (Signed)
Tolerating her metformin well. Continue to monitor. Call with any concerns.

## 2022-11-24 NOTE — Progress Notes (Signed)
BP 137/84   Pulse (!) 57   Temp 98.4 F (36.9 C) (Oral)   Ht 5' 3.78" (1.62 m)   Wt 167 lb (75.8 kg)   LMP 06/01/2019   SpO2 96%   BMI 28.86 kg/m    Subjective:    Patient ID: Brittany Ochoa, adult    DOB: 03-Jul-1965, 57 y.o.   MRN: [id number]  HPI: Brittany Ochoa is a 57 y.o. adult presenting on 11/24/2022 for comprehensive medical examination. Current medical complaints include:none  She currently lives with: alone Menopausal Symptoms: yes  Depression Screen done today and results listed below:     09/14/2022    3:22 PM 06/08/2022    9:20 AM 04/01/2021    8:34 AM 10/08/2020    3:10 PM 02/13/2020    4:18 PM  Depression screen PHQ 2/9  Decreased Interest 0 0 1 0 3  Down, Depressed, Hopeless 0 0 2 0 3  PHQ - 2 Score 0 0 3 0 6  Altered sleeping 0 3 3  3   Tired, decreased energy 0 3 2  3   Change in appetite 0 0 2  0  Feeling bad or failure about yourself  0 0 0  0  Trouble concentrating 0 3 0  0  Moving slowly or fidgety/restless 0 0 0  1  Suicidal thoughts 0 0 0  0  PHQ-9 Score 0 9 10  13   Difficult doing work/chores Not difficult at all    Not difficult at all    Past Medical History:  Past Medical History:  Diagnosis Date   Allergy 2015   seasonal   Arthritis    Ascending aortic aneurysm (HCC)    a. 06/2018 CTA Chest: 4.1 x 4.0 cm fusiform Asc Ao aneurysm-unchanged.  Descending thoracic aorta 2.1 cm.   Asthma    exercised induced   Autonomic dysfunction with vaccines    Dyspnea on exertion 2019   a. 06/2017 echo: EF 60 to 65%.  No regional wall motion abnormalities.  Ascending aortic diameter 3.9 cm.  Mild MR.   Endometrial polyp    Gastric erythema    History of hypertension no medications since 2020    Lipoma    of neck and chest   Multiple thyroid nodules    OCD (obsessive compulsive disorder)    Pre-diabetes    Reflux esophagitis     Surgical History:  Past Surgical History:  Procedure Laterality Date   colonscopy  12/16/2021   endoscopy done  also   COSMETIC SURGERY  1997   breast reduction   DILATATION & CURETTAGE/HYSTEROSCOPY WITH MYOSURE N/A 10/18/2022   Procedure: DILATATION & CURETTAGE/HYSTEROSCOPY WITH POLYPECTOMY BY MYOSURE;  Surgeon: Noland Fordyce, MD;  Location: [facility] [place];  Service: Gynecology;  Laterality: N/A;   ESOPHAGOGASTRODUODENOSCOPY (EGD) WITH PROPOFOL N/A 01/24/2018   Procedure: ESOPHAGOGASTRODUODENOSCOPY (EGD) WITH PROPOFOL;  Surgeon: Pasty Spillers, MD;  Location: [facility];  Service: Endoscopy;  Laterality: N/A;   ESOPHAGOGASTRODUODENOSCOPY (EGD) WITH PROPOFOL N/A 09/09/2020   Procedure: ESOPHAGOGASTRODUODENOSCOPY (EGD) WITH PROPOFOL;  Surgeon: Pasty Spillers, MD;  Location: [facility];  Service: Endoscopy;  Laterality: N/A;   ESOPHAGOGASTRODUODENOSCOPY (EGD) WITH PROPOFOL N/A 02/17/2021   Procedure: ESOPHAGOGASTRODUODENOSCOPY (EGD) WITH PROPOFOL;  Surgeon: Pasty Spillers, MD;  Location: [facility];  Service: Endoscopy;  Laterality: N/A;   lipoma removal  2007   back   REDUCTION MAMMAPLASTY Bilateral 12/01/1996    Medications:  Current Outpatient Medications on File Prior to Visit  Medication Sig   estradiol (ESTRACE) 0.1 MG/GM vaginal cream Estrogen Cream Instruction  Discard applicator  Apply pea sized amount to tip of finger to urethra before bed. Wash hands well after application. Use Monday, Wednesday and Friday   metFORMIN (GLUCOPHAGE-XR) 500 MG 24 hr tablet Take 1 tablet (500 mg total) by mouth 2 (two) times daily.   No current facility-administered medications on file prior to visit.    Allergies:  Allergies  Allergen Reactions   Codeine Anaphylaxis   Pholcodine     Other reaction(s): wheezing cannot ake due to aneurysm   Medical Provider Ez Flu Shot [Influenza Vac Split Quad] Other (See Comments)    Severe myalgias and bone pain Any vaccine sets off dysnomia and wind up in hospital   Quinolones     Aortic enlargement    Tirzepatide      Other Reaction(s): Hypertension/Headache sets off dysnomina    Social History:  Social History   Socioeconomic History   Marital status: Single    Spouse name: Not on file   Number of children: Not on file   Years of education: Not on file   Highest education level: Bachelor's degree (e.g., BA, AB, BS)  Occupational History   Not on file  Tobacco Use   Smoking status: Former    Current packs/day: 0.00    Average packs/day: 2.0 packs/day for 25.0 years (50.0 ttl pk-yrs)    Types: Cigarettes    Start date: 04/18/1979    Quit date: 04/17/2004    Years since quitting: 18.6    Passive exposure: Past   Smokeless tobacco: Never  Vaping Use   Vaping status: Never Used  Substance and Sexual Activity   Alcohol use: Not Currently    Comment: Extremely rare   Drug use: No    Comment: past only   Sexual activity: Not Currently  Other Topics Concern   Not on file  Social History Narrative   Not on file   Social Determinants of Health   Financial Resource Strain: Low Risk  (09/10/2022)   Overall Financial Resource Strain (CARDIA)    Difficulty of Paying Living Expenses: Not very hard  Food Insecurity: No Food Insecurity (09/10/2022)   Hunger Vital Sign    Worried About Running Out of Food in the Last Year: Never true    Ran Out of Food in the Last Year: Never true  Transportation Needs: No Transportation Needs (09/10/2022)   PRAPARE - Administrator, Civil Service (Medical): No    Lack of Transportation (Non-Medical): No  Physical Activity: Sufficiently Active (09/10/2022)   Exercise Vital Sign    Days of Exercise per Week: 7 days    Minutes of Exercise per Session: 40 min  Stress: No Stress Concern Present (09/10/2022)   [employer] of Occupational Health - Occupational Stress Questionnaire    Feeling of Stress : Only a little  Social Connections: Unknown (09/10/2022)   Social Connection and Isolation Panel [NHANES]    Frequency of Communication with Friends and  Family: More than three times a week    Frequency of Social Gatherings with Friends and Family: Twice a week    Attends Religious Services: More than 4 times per year    Active Member of [employer] or Organizations: No    Attends Engineer, structural: Not on file    Marital Status: Patient declined  Intimate Partner Violence: Not on file   Social History   Tobacco Use  Smoking Status  Former   Current packs/day: 0.00   Average packs/day: 2.0 packs/day for 25.0 years (50.0 ttl pk-yrs)   Types: Cigarettes   Start date: 04/18/1979   Quit date: 04/17/2004   Years since quitting: 18.6   Passive exposure: Past  Smokeless Tobacco Never   Social History   Substance and Sexual Activity  Alcohol Use Not Currently   Comment: Extremely rare    Family History:  Family History  Problem Relation Age of Onset   Diabetes Mother    Arthritis Mother    Obesity Mother    Miscarriages / Stillbirths Mother    Diabetes Father    Heart disease Father    Heart attack Father    Stomach cancer Father 29   Cancer Father    Asthma Brother    Anxiety disorder Brother    ADD / ADHD Brother    Osteoporosis Maternal Grandmother    Arthritis Maternal Grandmother    Varicose Veins Maternal Grandmother    Heart disease Paternal Grandmother    Stomach cancer Paternal Grandmother    Breast cancer Paternal Grandmother    Cancer Paternal Grandmother    Cancer Paternal Grandfather        spleen   Heart disease Paternal Grandfather    Alcohol abuse Maternal Uncle    Cancer Paternal Aunt        rare blood cancer; non genetic   Diabetes Paternal Aunt    Cancer Paternal Uncle        rare blood cancer; non genetic    Past medical history, surgical history, medications, allergies, family history and social history reviewed with patient today and changes made to appropriate areas of the chart.   Review of Systems  Constitutional: Negative.   HENT:  Positive for hearing loss. Negative for congestion,  ear discharge, ear pain, nosebleeds, sinus pain, sore throat and tinnitus.   Eyes: Negative.   Respiratory: Negative.  Negative for stridor.   Cardiovascular: Negative.   Gastrointestinal: Negative.   Genitourinary: Negative.   Musculoskeletal: Negative.   Skin: Negative.   Neurological: Negative.   Endo/Heme/Allergies: Negative.   Psychiatric/Behavioral: Negative.     All other ROS negative except what is listed above and in the HPI.      Objective:    BP 137/84   Pulse (!) 57   Temp 98.4 F (36.9 C) (Oral)   Ht 5' 3.78" (1.62 m)   Wt 167 lb (75.8 kg)   LMP 06/01/2019   SpO2 96%   BMI 28.86 kg/m   Wt Readings from Last 3 Encounters:  11/24/22 167 lb (75.8 kg)  11/15/22 168 lb (76.2 kg)  11/07/22 172 lb 9.6 oz (78.3 kg)    Physical Exam Vitals and nursing note reviewed.  Constitutional:      General: She is not in acute distress.    Appearance: Normal appearance. She is not ill-appearing, toxic-appearing or diaphoretic.  HENT:     Head: Normocephalic and atraumatic.     Right Ear: Tympanic membrane, ear canal and external ear normal. There is no impacted cerumen.     Left Ear: Tympanic membrane, ear canal and external ear normal. There is no impacted cerumen.     Nose: Nose normal. No congestion or rhinorrhea.     Mouth/Throat:     Mouth: Mucous membranes are moist.     Pharynx: Oropharynx is clear. No oropharyngeal exudate or posterior oropharyngeal erythema.  Eyes:     General: No scleral icterus.  Right eye: No discharge.        Left eye: No discharge.     Extraocular Movements: Extraocular movements intact.     Conjunctiva/sclera: Conjunctivae normal.     Pupils: Pupils are equal, round, and reactive to light.  Neck:     Vascular: No carotid bruit.  Cardiovascular:     Rate and Rhythm: Normal rate and regular rhythm.     Pulses: Normal pulses.     Heart sounds: No murmur heard.    No friction rub. No gallop.  Pulmonary:     Effort: Pulmonary effort  is normal. No respiratory distress.     Breath sounds: Normal breath sounds. No stridor. No wheezing, rhonchi or rales.  Chest:     Chest wall: No tenderness.  Abdominal:     General: Abdomen is flat. Bowel sounds are normal. There is no distension.     Palpations: Abdomen is soft. There is no mass.     Tenderness: There is no abdominal tenderness. There is no right CVA tenderness, left CVA tenderness, guarding or rebound.     Hernia: No hernia is present.  Genitourinary:    Comments: Breast and pelvic exams deferred with shared decision making Musculoskeletal:        General: No swelling, tenderness, deformity or signs of injury.     Cervical back: Normal range of motion and neck supple. No rigidity. No muscular tenderness.     Right lower leg: No edema.     Left lower leg: No edema.  Lymphadenopathy:     Cervical: No cervical adenopathy.  Skin:    General: Skin is warm and dry.     Capillary Refill: Capillary refill takes less than 2 seconds.     Coloration: Skin is not jaundiced or pale.     Findings: No bruising, erythema, lesion or rash.  Neurological:     General: No focal deficit present.     Mental Status: She is alert and oriented to person, place, and time. Mental status is at baseline.     Cranial Nerves: No cranial nerve deficit.     Sensory: No sensory deficit.     Motor: No weakness.     Coordination: Coordination normal.     Gait: Gait normal.     Deep Tendon Reflexes: Reflexes normal.  Psychiatric:        Mood and Affect: Mood normal.        Behavior: Behavior normal.        Thought Content: Thought content normal.        Judgment: Judgment normal.     Results for orders placed or performed during the hospital encounter of 10/18/22  CBC  Result Value Ref Range   WBC 6.2 4.0 - 10.5 K/uL   RBC 4.52 3.87 - 5.11 MIL/uL   Hemoglobin 13.6 12.0 - 15.0 g/dL   HCT 40.9 81.1 - 91.4 %   MCV 91.8 80.0 - 100.0 fL   MCH 30.1 26.0 - 34.0 pg   MCHC 32.8 30.0 - 36.0  g/dL   RDW 78.2 95.6 - 21.3 %   Platelets 255 150 - 400 K/uL   nRBC 0.0 0.0 - 0.2 %  Glucose, capillary  Result Value Ref Range   Glucose-Capillary 138 (H) 70 - 99 mg/dL   Comment 1 Document in Chart   I-STAT, chem 8  Result Value Ref Range   Sodium 141 135 - 145 mmol/L   Potassium 3.6 3.5 - 5.1 mmol/L  Chloride 105 98 - 111 mmol/L   BUN 11 6 - 20 mg/dL   Creatinine, Ser 0.10 0.44 - 1.00 mg/dL   Glucose, Bld 92 70 - 99 mg/dL   Calcium, Ion 9.32 3.55 - 1.40 mmol/L   TCO2 26 22 - 32 mmol/L   Hemoglobin 12.9 12.0 - 15.0 g/dL   HCT 73.2 20.2 - 54.2 %  Type and screen  Result Value Ref Range   ABO/RH(D) A POS    Antibody Screen NEG    Sample Expiration      10/21/2022,2359 Performed at [facility], [address], [place], [place] [address]   Surgical pathology  Result Value Ref Range   SURGICAL PATHOLOGY      SURGICAL PATHOLOGY CASE: [id number] PATIENT: Cephas Darby Surgical Pathology Report     Clinical History: Endometrial polyp (crm)     FINAL MICROSCOPIC DIAGNOSIS:  A. ENDOMETRIUM, POLYPECTOMY:      Benign endometrial polyp.      Negative for hyperplasia, atypia or malignancy.   GROSS DESCRIPTION:  Specimen is received in formalin in a mesh bag, and consists of a 2.4 x 2.1 x 0.6 cm aggregate of tan-pink soft tissue fragments.  Specimen is entirely submitted in 2 cassettes.  Lovey Newcomer 10/18/2022)    Final Diagnosis performed by Lance Coon, MD.   Electronically signed 10/20/2022 Technical component performed at [facility], [address], [place], [place] [address].  Professional component performed at [employer], [address], [place], [place] [address].  Immunohistochemistry Technical component (if applicable) was performed at [facility]. [address], [address], [place], [place] [address].   IMMU NOHISTOCHEMISTRY DISCLAIMER (if applicable): Some of these immunohistochemical  stains may have been developed and the performance characteristics determine by [facility]. Some may not have been cleared or approved by the U.S. Food and Drug Administration. The FDA has determined that such clearance or approval is not necessary. This test is used for clinical purposes. It should not be regarded as investigational or for research. This laboratory is certified under the Clinical Laboratory Improvement Amendments of 1988 (CLIA-88) as qualified to perform high complexity clinical laboratory testing.  The controls stained appropriately.   IHC stains are performed on formalin fixed, paraffin embedded tissue using a 3,3"diaminobenzidine (DAB) chromogen and Leica Bond Autostainer System. The staining intensity of the nucleus is score manually and is reported as the percentage of tumor cell nuclei demonstrating specific nuclear staining. The specimens are fixed in 10%  Neutral Formalin for at least 6 hours and up to 72hrs. These tests are validated on decalcified tissue. Results should be interpreted with caution given the possibility of false negative results on decalcified specimens. Antibody Clones are as follows ER-clone [id number], PR-clone 16, Ki67- clone [id number]. Some of these immunohistochemical stains may have been developed and the performance characteristics determined by [facility].       Assessment & Plan:   Problem List Items Addressed This Visit       Endocrine   IFG (impaired fasting glucose)    Tolerating her metformin well. Continue to monitor. Call with any concerns.      Relevant Orders   Bayer DCA Hb A1c Waived   Other Visit Diagnoses     Routine general medical examination at a health care facility    -  Primary   Vaccines up to date. Screening labs checked today. Pap, mammo and colonoscopy up to date. Continue diet and exercise.  Call w concerns. Continue diet & exercise.   Relevant Orders   CBC with Differential/Platelet    Comprehensive metabolic panel   Lipid Panel w/o Chol/HDL Ratio   Urinalysis, Routine w reflex microscopic   TSH        Follow up plan: Return in about 1 year (around 11/24/2023) for physical.   LABORATORY TESTING:  - Pap smear: up to date  IMMUNIZATIONS:   - Tdap: Tetanus vaccination status reviewed: last tetanus booster within 10 years. - Influenza: Refused - Pneumovax: Not applicable - Prevnar: Not applicable - COVID: Refused - HPV: Not applicable - Shingrix vaccine: Refused  SCREENING: -Mammogram: Up to date  - Colonoscopy: Up to date   PATIENT COUNSELING:   Advised to take 1 mg of folate supplement per day if capable of pregnancy.   Sexuality: Discussed sexually transmitted diseases, partner selection, use of condoms, avoidance of unintended pregnancy  and contraceptive alternatives.   Advised to avoid cigarette smoking.  I discussed with the patient that most people either abstain from alcohol or drink within safe limits (<=14/week and <=4 drinks/occasion for males, <=7/weeks and <= 3 drinks/occasion for females) and that the risk for alcohol disorders and other health effects rises proportionally with the number of drinks per week and how often a drinker exceeds daily limits.  Discussed cessation/primary prevention of drug use and availability of treatment for abuse.   Diet: Encouraged to adjust caloric intake to maintain  or achieve ideal body weight, to reduce intake of dietary saturated fat and total fat, to limit sodium intake by avoiding high sodium foods and not adding table salt, and to maintain adequate dietary potassium and calcium preferably from fresh fruits, vegetables, and low-fat dairy products.    stressed the importance of regular exercise  Injury prevention: Discussed safety belts, safety helmets, smoke detector, smoking near bedding or upholstery.   Dental health: Discussed importance of regular tooth brushing, flossing, and dental visits.    NEXT  PREVENTATIVE PHYSICAL DUE IN 1 YEAR. Return in about 1 year (around 11/24/2023) for physical.

## 2022-12-05 ENCOUNTER — Ambulatory Visit: Payer: 59 | Attending: Cardiovascular Disease

## 2022-12-05 DIAGNOSIS — I3139 Other pericardial effusion (noninflammatory): Secondary | ICD-10-CM

## 2022-12-05 LAB — ECHOCARDIOGRAM COMPLETE
Area-P 1/2: 4.39 cm2
S' Lateral: 2.6 cm

## 2022-12-29 ENCOUNTER — Encounter: Payer: 59 | Admitting: Family Medicine

## 2023-01-17 ENCOUNTER — Other Ambulatory Visit: Payer: Self-pay

## 2023-01-22 ENCOUNTER — Encounter: Payer: Self-pay | Admitting: Emergency Medicine

## 2023-01-22 ENCOUNTER — Ambulatory Visit
Admission: EM | Admit: 2023-01-22 | Discharge: 2023-01-22 | Disposition: A | Discharge status: Home / Self Care | Payer: 59 | Attending: Emergency Medicine | Admitting: Emergency Medicine

## 2023-01-22 DIAGNOSIS — Z1152 Encounter for screening for COVID-19: Secondary | ICD-10-CM | POA: Diagnosis not present

## 2023-01-22 DIAGNOSIS — B349 Viral infection, unspecified: Secondary | ICD-10-CM | POA: Diagnosis not present

## 2023-01-22 NOTE — ED Provider Notes (Signed)
Brittany Ochoa    CSN: [id number] Arrival date & time: 01/22/23  1144      History   Chief Complaint Chief Complaint  Patient presents with   sinus pressure   Otalgia    HPI Brittany Ochoa is a 57 y.o. adult.   Patient presents for evaluation of bilateral ear pain and fullness, nasal congestion, postnasal drip, sinus pain and pressure across the cheeks and bilateral watering eyes present for 1 day.  Endorses travel to [place] with return yesterday via airplane.  Has attempted use of Mucinex make sure medicine which has provided minimal relief.  No known sick contact.  Tolerating food and liquids.  Denies presence of fever.  Home COVID test negative.  Past Medical History:  Diagnosis Date   Allergy 2015   seasonal   Arthritis    Ascending aortic aneurysm (HCC)    a. 06/2018 CTA Chest: 4.1 x 4.0 cm fusiform Asc Ao aneurysm-unchanged.  Descending thoracic aorta 2.1 cm.   Asthma    exercised induced   Autonomic dysfunction with vaccines    Dyspnea on exertion 2019   a. 06/2017 echo: EF 60 to 65%.  No regional wall motion abnormalities.  Ascending aortic diameter 3.9 cm.  Mild MR.   Endometrial polyp    Gastric erythema    History of hypertension no medications since 2020    Lipoma    of neck and chest   Multiple thyroid nodules    OCD (obsessive compulsive disorder)    Pre-diabetes    Reflux esophagitis     Patient Active Problem List   Diagnosis Date Noted   Urinary symptom or sign 05/03/2021   Gastric intestinal metaplasia    Hepatic steatosis 10/08/2020   Dysautonomia (HCC) 10/08/2020   Gastric erythema    Goiter 08/25/2020   Tachycardia 08/25/2020   Autoimmune disease (HCC) 05/27/2019   Essential hypertension 12/16/2018   Menopause 10/04/2018   Esophageal dysphagia    Stomach irritation    Exercise-induced asthma 10/26/2017   Multiple thyroid nodules 07/15/2017   Mitral regurgitation 07/15/2017   History of radiation exposure 07/15/2017   IFG  (impaired fasting glucose) 05/04/2017   Ascending aortic aneurysm (HCC) 05/04/2017   Pineal gland cyst 09/28/2016    Past Surgical History:  Procedure Laterality Date   colonscopy  12/16/2021   endoscopy done also   COSMETIC SURGERY  1997   breast reduction   DILATATION & CURETTAGE/HYSTEROSCOPY WITH MYOSURE N/A 10/18/2022   Procedure: DILATATION & CURETTAGE/HYSTEROSCOPY WITH POLYPECTOMY BY MYOSURE;  Surgeon: Noland Fordyce, MD;  Location: [facility];  Service: Gynecology;  Laterality: N/A;   ESOPHAGOGASTRODUODENOSCOPY (EGD) WITH PROPOFOL N/A 01/24/2018   Procedure: ESOPHAGOGASTRODUODENOSCOPY (EGD) WITH PROPOFOL;  Surgeon: Pasty Spillers, MD;  Location: [facility];  Service: Endoscopy;  Laterality: N/A;   ESOPHAGOGASTRODUODENOSCOPY (EGD) WITH PROPOFOL N/A 09/09/2020   Procedure: ESOPHAGOGASTRODUODENOSCOPY (EGD) WITH PROPOFOL;  Surgeon: Pasty Spillers, MD;  Location: [facility];  Service: Endoscopy;  Laterality: N/A;   ESOPHAGOGASTRODUODENOSCOPY (EGD) WITH PROPOFOL N/A 02/17/2021   Procedure: ESOPHAGOGASTRODUODENOSCOPY (EGD) WITH PROPOFOL;  Surgeon: Pasty Spillers, MD;  Location: [facility];  Service: Endoscopy;  Laterality: N/A;   lipoma removal  2007   back   REDUCTION MAMMAPLASTY Bilateral 12/01/1996    OB History     Gravida  0   Para      Term      Preterm      AB      Living  SAB      IAB      Ectopic      Multiple      Live Births           Obstetric Comments  Menstrual age: 63   Age 1st Pregnancy: 39            Home Medications    Prior to Admission medications   Medication Sig Start Date End Date Taking? Authorizing Provider  estradiol (ESTRACE) 0.1 MG/GM vaginal cream Estrogen Cream Instruction  Discard applicator  Apply pea sized amount to tip of finger to urethra before bed. Wash hands well after application. Use Monday, Wednesday and Friday 10/19/21   Vanna Scotland, MD  metFORMIN  (GLUCOPHAGE-XR) 500 MG 24 hr tablet Take 1 tablet (500 mg total) by mouth 2 (two) times daily. 08/14/22       Family History Family History  Problem Relation Age of Onset   Diabetes Mother    Arthritis Mother    Obesity Mother    Miscarriages / Stillbirths Mother    Diabetes Father    Heart disease Father    Heart attack Father    Stomach cancer Father 46   Cancer Father    Asthma Brother    Anxiety disorder Brother    ADD / ADHD Brother    Osteoporosis Maternal Grandmother    Arthritis Maternal Grandmother    Varicose Veins Maternal Grandmother    Heart disease Paternal Grandmother    Stomach cancer Paternal Grandmother    Breast cancer Paternal Grandmother    Cancer Paternal Grandmother    Cancer Paternal Grandfather        spleen   Heart disease Paternal Grandfather    Alcohol abuse Maternal Uncle    Cancer Paternal Aunt        rare blood cancer; non genetic   Diabetes Paternal Aunt    Cancer Paternal Uncle        rare blood cancer; non genetic    Social History Social History   Tobacco Use   Smoking status: Former    Current packs/day: 0.00    Average packs/day: 2.0 packs/day for 25.0 years (50.0 ttl pk-yrs)    Types: Cigarettes    Start date: 04/18/1979    Quit date: 04/17/2004    Years since quitting: 18.7    Passive exposure: Past   Smokeless tobacco: Never  Vaping Use   Vaping status: Never Used  Substance Use Topics   Alcohol use: Not Currently    Comment: Extremely rare   Drug use: No    Comment: past only     Allergies   Codeine, Pholcodine, Medical provider ez flu shot [influenza vac split quad], Quinolones, and Tirzepatide   Review of Systems Review of Systems   Physical Exam Triage Vital Signs ED Triage Vitals  Encounter Vitals Group     BP 01/22/23 1203 138/86     Systolic BP Percentile --      Diastolic BP Percentile --      Pulse Rate 01/22/23 1203 80     Resp 01/22/23 1203 17     Temp 01/22/23 1203 99.2 F (37.3 C)     Temp  Source 01/22/23 1203 Oral     SpO2 01/22/23 1203 96 %     Weight --      Height --      Head Circumference --      Peak Flow --      Pain Score 01/22/23 1209  4     Pain Loc --      Pain Education --      Exclude from Growth Chart --    No data found.  Updated Vital Signs BP 138/86 (BP Location: Left Arm)   Pulse 80   Temp 99.2 F (37.3 C) (Oral)   Resp 17   LMP 06/01/2019   SpO2 96%   Visual Acuity Right Eye Distance:   Left Eye Distance:   Bilateral Distance:    Right Eye Near:   Left Eye Near:    Bilateral Near:     Physical Exam Constitutional:      Appearance: Normal appearance.  HENT:     Head: Normocephalic.     Right Ear: Tympanic membrane, ear canal and external ear normal.     Left Ear: Tympanic membrane, ear canal and external ear normal.     Nose: Congestion and rhinorrhea present.     Right Sinus: Maxillary sinus tenderness present. No frontal sinus tenderness.     Left Sinus: Maxillary sinus tenderness present. No frontal sinus tenderness.     Mouth/Throat:     Pharynx: Posterior oropharyngeal erythema present. No oropharyngeal exudate.  Eyes:     Extraocular Movements: Extraocular movements intact.  Cardiovascular:     Rate and Rhythm: Normal rate and regular rhythm.     Pulses: Normal pulses.     Heart sounds: Normal heart sounds.  Pulmonary:     Effort: Pulmonary effort is normal.     Breath sounds: Normal breath sounds.  Musculoskeletal:     Cervical back: Normal range of motion and neck supple.  Neurological:     Mental Status: She is alert and oriented to person, place, and time. Mental status is at baseline.      UC Treatments / Results  Labs (all labs ordered are listed, but only abnormal results are displayed) Labs Reviewed  SARS CORONAVIRUS 2 (TAT 6-24 HRS)    EKG   Radiology No results found.  Procedures Procedures (including critical care time)  Medications Ordered in UC Medications - No data to display  Initial  Impression / Assessment and Plan / UC Course  I have reviewed the triage vital signs and the nursing notes.  Pertinent labs & imaging results that were available during my care of the patient were reviewed by me and considered in my medical decision making (see chart for details).  Viral illness  Patient is in no signs of distress nor toxic appearing.  Vital signs are stable.  Low suspicion for pneumonia, pneumothorax or bronchitis and therefore will defer imaging.  Presentation consistent with a sinusitis, beginning 1 day ago, etiology most likely viral, exacerbated by airplane.COVID test is pending, reviewed quarantine guidelines per CDC recommendations\. discussed use of over-the-counter medications as needed for supportive care.  May follow-up with urgent care as needed if symptoms persist or worsen.   Final Clinical Impressions(s) / UC Diagnoses   Final diagnoses:  Viral illness     Discharge Instructions      Your symptoms today are most likely being caused by a virus and should steadily improve in time it can take up to 7 to 10 days before you truly start to see a turnaround however things will get better  COVID test is pending up to 24 hours, you will be notified of positive test results only, if positive you will need to quarantine until without fever for 24 hours, if no fever you may continue activity wearing mask  until symptoms resolve    You can take Tylenol and/or Ibuprofen as needed for fever reduction and pain relief.   For cough: honey 1/2 to 1 teaspoon (you can dilute the honey in water or another fluid).  You can also use guaifenesin and dextromethorphan for cough. You can use a humidifier for chest congestion and cough.  If you don't have a humidifier, you can sit in the bathroom with the hot shower running.      For sore throat: try warm salt water gargles, cepacol lozenges, throat spray, warm tea or water with lemon/honey, popsicles or ice, or OTC cold relief medicine  for throat discomfort.   For congestion: take a daily anti-histamine like Zyrtec, Claritin, and a oral decongestant, such as pseudoephedrine.  You can also use Flonase 1-2 sprays in each nostril daily.   It is important to stay hydrated: drink plenty of fluids (water, gatorade/powerade/pedialyte, juices, or teas) to keep your throat moisturized and help further relieve irritation/discomfort.    ED Prescriptions   None    PDMP not reviewed this encounter.   Valinda Hoar, NP 01/22/23 1236

## 2023-01-22 NOTE — Discharge Instructions (Addendum)
Your symptoms today are most likely being caused by a virus and should steadily improve in time it can take up to 7 to 10 days before you truly start to see a turnaround however things will get better  COVID test is pending up to 24 hours, you will be notified of positive test results only, if positive you will need to quarantine until without fever for 24 hours, if no fever you may continue activity wearing mask until symptoms resolve    You can take Tylenol and/or Ibuprofen as needed for fever reduction and pain relief.   For cough: honey 1/2 to 1 teaspoon (you can dilute the honey in water or another fluid).  You can also use guaifenesin and dextromethorphan for cough. You can use a humidifier for chest congestion and cough.  If you don't have a humidifier, you can sit in the bathroom with the hot shower running.      For sore throat: try warm salt water gargles, cepacol lozenges, throat spray, warm tea or water with lemon/honey, popsicles or ice, or OTC cold relief medicine for throat discomfort.   For congestion: take a daily anti-histamine like Zyrtec, Claritin, and a oral decongestant, such as pseudoephedrine.  You can also use Flonase 1-2 sprays in each nostril daily.   It is important to stay hydrated: drink plenty of fluids (water, gatorade/powerade/pedialyte, juices, or teas) to keep your throat moisturized and help further relieve irritation/discomfort.

## 2023-01-22 NOTE — ED Triage Notes (Signed)
Pt presents with bilateral ear pain, sinus pressure, watery eyes since yesterday. Pt just returned from [place] and took a covid test that was negative. Pt also needs a covid test for work.

## 2023-01-23 LAB — SARS CORONAVIRUS 2 (TAT 6-24 HRS): SARS Coronavirus 2: NEGATIVE

## 2023-01-25 ENCOUNTER — Ambulatory Visit: Payer: 59 | Admitting: Family Medicine

## 2023-01-25 ENCOUNTER — Ambulatory Visit: Payer: 59 | Admitting: Cardiovascular Disease

## 2023-01-25 ENCOUNTER — Other Ambulatory Visit: Payer: Self-pay

## 2023-01-25 VITALS — BP 134/85 | HR 79 | Temp 98.2°F | Ht 64.57 in | Wt 171.6 lb

## 2023-01-25 DIAGNOSIS — R0981 Nasal congestion: Secondary | ICD-10-CM

## 2023-01-25 DIAGNOSIS — R058 Other specified cough: Secondary | ICD-10-CM | POA: Diagnosis not present

## 2023-01-25 DIAGNOSIS — R5383 Other fatigue: Secondary | ICD-10-CM

## 2023-01-25 DIAGNOSIS — L659 Nonscarring hair loss, unspecified: Secondary | ICD-10-CM | POA: Diagnosis not present

## 2023-01-25 DIAGNOSIS — R7301 Impaired fasting glucose: Secondary | ICD-10-CM | POA: Diagnosis not present

## 2023-01-25 MED ORDER — BENZONATATE 100 MG PO CAPS
100.0000 mg | ORAL_CAPSULE | Freq: Two times a day (BID) | ORAL | 0 refills | Status: DC | PRN
Start: 2023-01-25 — End: 2023-06-27
  Filled 2023-01-25: qty 20, 10d supply, fill #0

## 2023-01-25 NOTE — Assessment & Plan Note (Addendum)
Acute, ongoing. Tessalon BID prescribed to take as needed. Recommend otc management with Flonase or delsym, warm liquids such as hot tea and soup, rest and hydration. Return as needed, if symptoms do not improve in 3 days will consider adding Augmentin for sinusitis.

## 2023-01-25 NOTE — Assessment & Plan Note (Signed)
Acute, ongoing. Referral placed to dermatology. Labs drawn today, TSH, T4, thyroid antibody, CBC, Iron, Ferritin, TIBC. Will treat based on results.

## 2023-01-25 NOTE — Patient Instructions (Signed)
Recommend the following: -Dayquil, Nyquil -Delsym or Tessalon for cough -Flonase for nasal congestion -Warm liquids such as hot tea and soup -Plenty of rest and hydration

## 2023-01-25 NOTE — Progress Notes (Signed)
BP 134/85   Pulse 79   Temp 98.2 F (36.8 C) (Oral)   Ht 5' 4.57" (1.64 m)   Wt 171 lb 9.6 oz (77.8 kg)   LMP 06/01/2019   SpO2 97%   BMI 28.94 kg/m    Subjective:    Patient ID: Brittany Ochoa, adult    DOB: January 03, 1966, 57 y.o.   MRN: [id number]  HPI: Brittany Ochoa is a 57 y.o. adult  Chief Complaint  Patient presents with   URI    HAIR LOSS Patient complains that she is noticing an increase in hair shredding. She complains of hair thinning and bald spots in her head and has noticed a significant increase since August 30. She describes the hair loss as using larger hair clips and now only being able to use micro clips. She has concern for hashimoto thyroiditis and is requesting lab work in regards to that. She is also requesting new referral to endocrinology as insurance did not cover her current one.   UPPER RESPIRATORY TRACT INFECTION Patient visited urgent care on 01/22/2023 for otalgia and sinus pressure. Today she complains her symptoms have worsened. Symptoms started Sunday night. She has taken x3 COVID test and all were negative. Symptoms started after traveling to [place] over the weekend. She originally started to feel a little better but feels it is getting worse.  Worst symptom: Nasal congestion and chest congestion Fever: yes 100.30F Cough: yes Shortness of breath: yes with coughing Wheezing: no Chest pain: yes, with cough Chest tightness: yes Chest congestion: yes Nasal congestion: yes Runny nose: yes Post nasal drip:  Yes Sneezing: yes Sore throat: yes Swollen glands: no Sinus pressure: yes Headache: yes Face pain: yes Toothache: no Ear pain: Yes bilateral Ear pressure: yes bilateral Eyes red/itching:yes itchiness Eye drainage/crusting: no  Vomiting: no Rash: No Fatigue: no Sick contacts: no Strep contacts: no  Context: worse Recurrent sinusitis: yes when travelling Relief with OTC cold/cough medications:  mild   Treatments attempted:  Tylenol and  mucinex     Relevant past medical, surgical, family and social history reviewed and updated as indicated. Interim medical history since our last visit reviewed. Allergies and medications reviewed and updated.  Review of Systems  Constitutional:  Positive for fatigue and fever. Negative for chills.  HENT:  Positive for congestion, postnasal drip, rhinorrhea, sinus pressure, sinus pain, sneezing and sore throat. Negative for ear pain.   Eyes:  Negative for discharge, redness and itching.  Respiratory:  Positive for cough, chest tightness and shortness of breath. Negative for wheezing.   Cardiovascular:  Negative for chest pain.  Gastrointestinal:  Negative for vomiting.  Skin:  Negative for rash.  Neurological:  Positive for headaches.    Per HPI unless specifically indicated above     Objective:    BP 134/85   Pulse 79   Temp 98.2 F (36.8 C) (Oral)   Ht 5' 4.57" (1.64 m)   Wt 171 lb 9.6 oz (77.8 kg)   LMP 06/01/2019   SpO2 97%   BMI 28.94 kg/m   Wt Readings from Last 3 Encounters:  01/25/23 171 lb 9.6 oz (77.8 kg)  11/24/22 167 lb (75.8 kg)  11/15/22 168 lb (76.2 kg)    Physical Exam Vitals and nursing note reviewed.  Constitutional:      General: She is awake. She is not in acute distress.    Appearance: Normal appearance. She is well-developed and well-groomed. She is ill-appearing.  HENT:  Head: Normocephalic and atraumatic.     Right Ear: Hearing and external ear normal. Tenderness present. No drainage. Tympanic membrane is not erythematous.     Left Ear: Hearing and external ear normal. Tenderness present. No drainage. Tympanic membrane is not erythematous.     Nose: Congestion and rhinorrhea present.     Right Turbinates: Not swollen or pale.     Left Turbinates: Not swollen or pale.     Right Sinus: Maxillary sinus tenderness and frontal sinus tenderness present.     Left Sinus: Maxillary sinus tenderness and frontal sinus tenderness present.      Mouth/Throat:     Pharynx: Posterior oropharyngeal erythema present.  Eyes:     General: Lids are normal.        Right eye: No discharge.        Left eye: No discharge.     Conjunctiva/sclera: Conjunctivae normal.  Cardiovascular:     Rate and Rhythm: Normal rate and regular rhythm.     Pulses:          Radial pulses are 2+ on the right side and 2+ on the left side.       Posterior tibial pulses are 2+ on the right side and 2+ on the left side.     Heart sounds: Normal heart sounds, S1 normal and S2 normal. No murmur heard.    No gallop.  Pulmonary:     Effort: Pulmonary effort is normal. No accessory muscle usage or respiratory distress.     Breath sounds: Normal breath sounds. No wheezing, rhonchi or rales.  Musculoskeletal:        General: Normal range of motion.     Cervical back: Full passive range of motion without pain and normal range of motion.     Right lower leg: No edema.     Left lower leg: No edema.  Skin:    General: Skin is warm and dry.     Capillary Refill: Capillary refill takes less than 2 seconds.  Neurological:     Mental Status: She is alert and oriented to person, place, and time.  Psychiatric:        Attention and Perception: Attention normal.        Mood and Affect: Mood normal.        Speech: Speech normal.        Behavior: Behavior normal. Behavior is cooperative.        Thought Content: Thought content normal.     Results for orders placed or performed during the hospital encounter of 01/22/23  SARS CORONAVIRUS 2 (TAT 6-24 HRS) Anterior Nasal Swab   Specimen: Anterior Nasal Swab  Result Value Ref Range   SARS Coronavirus 2 NEGATIVE NEGATIVE      Assessment & Plan:   Problem List Items Addressed This Visit     IFG (impaired fasting glucose)   Relevant Orders   Ambulatory referral to Endocrinology   Hair loss - Primary    Acute, ongoing. Referral placed to dermatology. Labs drawn today, TSH, T4, thyroid antibody, CBC, Iron, Ferritin, TIBC.  Will treat based on results.       Relevant Orders   Iron, TIBC and Ferritin Panel   Ambulatory referral to Dermatology   Vitamin D (25 hydroxy)   TSH + free T4   Thyroid peroxidase antibody   CBC With Diff/Platelet   Ambulatory referral to Endocrinology   Cough with congestion of paranasal sinus    Acute, ongoing. Tessalon BID prescribed  to take as needed. Recommend otc management with Flonase or delsym, warm liquids such as hot tea and soup, rest and hydration. Return as needed, if symptoms do not improve in 3 days will consider adding Augmentin for sinusitis.      Other Visit Diagnoses     Fatigue, unspecified type       Relevant Orders   Iron, TIBC and Ferritin Panel   Vitamin D (25 hydroxy)   CBC With Diff/Platelet        Follow up plan: Return if symptoms worsen or fail to improve.

## 2023-01-26 LAB — VITAMIN D 25 HYDROXY (VIT D DEFICIENCY, FRACTURES): Vit D, 25-Hydroxy: 21.4 ng/mL — ABNORMAL LOW (ref 30.0–100.0)

## 2023-01-26 LAB — IRON,TIBC AND FERRITIN PANEL
Ferritin: 157 ng/mL — ABNORMAL HIGH (ref 15–150)
Iron Saturation: 22 % (ref 15–55)
Iron: 67 ug/dL (ref 27–159)
Total Iron Binding Capacity: 306 ug/dL (ref 250–450)
UIBC: 239 ug/dL (ref 131–425)

## 2023-01-26 LAB — THYROID PEROXIDASE ANTIBODY: Thyroperoxidase Ab SerPl-aCnc: <9 [IU]/mL (ref 0–34)

## 2023-01-26 NOTE — Addendum Note (Signed)
Addended by: Prescott Gum on: 01/26/2023 08:36 AM   Modules accepted: Orders

## 2023-01-29 ENCOUNTER — Other Ambulatory Visit: Payer: 59

## 2023-01-29 ENCOUNTER — Encounter: Payer: Self-pay | Admitting: Family Medicine

## 2023-01-29 ENCOUNTER — Other Ambulatory Visit: Payer: Self-pay

## 2023-01-29 DIAGNOSIS — R5383 Other fatigue: Secondary | ICD-10-CM

## 2023-01-29 DIAGNOSIS — L659 Nonscarring hair loss, unspecified: Secondary | ICD-10-CM | POA: Diagnosis not present

## 2023-01-29 MED ORDER — ERGOCALCIFEROL 1.25 MG (50000 UT) PO CAPS
50000.0000 [IU] | ORAL_CAPSULE | ORAL | 1 refills | Status: DC
  Filled 2023-01-29: qty 12, 84d supply, fill #0

## 2023-01-29 NOTE — Progress Notes (Signed)
Hi Brittany Ochoa, we would like for you to schedule a lab visit with [place] as soon as you are available to. There was a disruption in processing your Thyroid labs and CBC, we will need to redraw for those labs. Your vitamin D remains low, I have sent over Vitamin D tablets for you to take weekly over the next 3-6 months, and we will recheck those levels in the future. Thank you for allowing me to participate in your care.

## 2023-01-29 NOTE — Addendum Note (Signed)
Addended by: Prescott Gum on: 01/29/2023 10:49 AM   Modules accepted: Orders

## 2023-01-30 LAB — CBC WITH DIFFERENTIAL/PLATELET
Basophils Absolute: 0.1 10*3/uL (ref 0.0–0.2)
Basos: 1 %
EOS (ABSOLUTE): 0.1 10*3/uL (ref 0.0–0.4)
Eos: 1 %
Hematocrit: 40.2 % (ref 34.0–46.6)
Hemoglobin: 13.3 g/dL (ref 11.1–15.9)
Immature Grans (Abs): 0.1 10*3/uL (ref 0.0–0.1)
Immature Granulocytes: 1 %
Lymphocytes Absolute: 2.2 10*3/uL (ref 0.7–3.1)
Lymphs: 27 %
MCH: 30.4 pg (ref 26.6–33.0)
MCHC: 33.1 g/dL (ref 31.5–35.7)
MCV: 92 fL (ref 79–97)
Monocytes Absolute: 0.4 10*3/uL (ref 0.1–0.9)
Monocytes: 6 %
Neutrophils Absolute: 5.1 10*3/uL (ref 1.4–7.0)
Neutrophils: 64 %
Platelets: 279 10*3/uL (ref 150–450)
RBC: 4.38 x10E6/uL (ref 3.77–5.28)
RDW: 12.5 % (ref 11.7–15.4)
WBC: 8 10*3/uL (ref 3.4–10.8)

## 2023-01-30 LAB — TSH+FREE T4
Free T4: 1.12 ng/dL (ref 0.82–1.77)
TSH: 0.648 u[IU]/mL (ref 0.450–4.500)

## 2023-02-01 NOTE — Progress Notes (Signed)
Hi Brittany Ochoa, your CBC returned as normal, no signs of infection or anemia. Your iron levels returned as normal. Ferritin was slightly higher than normal, not at a level for concern. Thyroid levels returned as normal. Continue with referral to dermatology and endocrine. Thank you for allowing me to participate in your care.

## 2023-02-02 ENCOUNTER — Ambulatory Visit: Payer: 59 | Admitting: Family Medicine

## 2023-02-05 ENCOUNTER — Other Ambulatory Visit: Payer: Self-pay

## 2023-02-06 ENCOUNTER — Other Ambulatory Visit: Payer: Self-pay

## 2023-03-23 ENCOUNTER — Ambulatory Visit: Payer: 59 | Admitting: Family Medicine

## 2023-05-14 ENCOUNTER — Other Ambulatory Visit: Payer: Self-pay

## 2023-05-14 MED ORDER — METFORMIN HCL ER 500 MG PO TB24
500.0000 mg | ORAL_TABLET | Freq: Two times a day (BID) | ORAL | 6 refills | Status: DC
  Filled 2023-05-14: qty 60, 30d supply, fill #0
  Filled 2023-06-15: qty 60, 30d supply, fill #1
  Filled 2023-08-22: qty 60, 30d supply, fill #2
  Filled 2023-09-18: qty 60, 30d supply, fill #3
  Filled 2023-10-29: qty 60, 30d supply, fill #4
  Filled 2023-11-29: qty 60, 30d supply, fill #5
  Filled 2024-01-05: qty 60, 30d supply, fill #6

## 2023-05-24 ENCOUNTER — Ambulatory Visit: Payer: Self-pay

## 2023-06-21 ENCOUNTER — Telehealth: Admitting: Family Medicine

## 2023-06-21 ENCOUNTER — Other Ambulatory Visit: Payer: Self-pay

## 2023-06-21 DIAGNOSIS — J069 Acute upper respiratory infection, unspecified: Secondary | ICD-10-CM | POA: Diagnosis not present

## 2023-06-21 MED ORDER — FLUTICASONE PROPIONATE 50 MCG/ACT NA SUSP
2.0000 | Freq: Every day | NASAL | 0 refills | Status: DC
Start: 2023-06-21 — End: 2023-09-19
  Filled 2023-06-21: qty 16, 30d supply, fill #0

## 2023-06-21 NOTE — Progress Notes (Signed)

## 2023-06-26 ENCOUNTER — Ambulatory Visit: Payer: 59 | Admitting: Dermatology

## 2023-06-27 ENCOUNTER — Telehealth: Admitting: Physician Assistant

## 2023-06-27 ENCOUNTER — Other Ambulatory Visit: Payer: Self-pay

## 2023-06-27 DIAGNOSIS — B9689 Other specified bacterial agents as the cause of diseases classified elsewhere: Secondary | ICD-10-CM

## 2023-06-27 MED ORDER — AMOXICILLIN-POT CLAVULANATE 875-125 MG PO TABS
1.0000 | ORAL_TABLET | Freq: Two times a day (BID) | ORAL | 0 refills | Status: DC
Start: 2023-06-27 — End: 2023-08-24
  Filled 2023-06-27: qty 14, 7d supply, fill #0

## 2023-06-27 NOTE — Progress Notes (Signed)

## 2023-07-25 DIAGNOSIS — R300 Dysuria: Secondary | ICD-10-CM | POA: Diagnosis not present

## 2023-07-27 ENCOUNTER — Ambulatory Visit: Admitting: Physician Assistant

## 2023-07-30 ENCOUNTER — Ambulatory Visit: Payer: 59 | Admitting: Dermatology

## 2023-08-19 ENCOUNTER — Other Ambulatory Visit: Payer: Self-pay

## 2023-08-19 ENCOUNTER — Telehealth: Admitting: Family Medicine

## 2023-08-19 DIAGNOSIS — N3000 Acute cystitis without hematuria: Secondary | ICD-10-CM | POA: Diagnosis not present

## 2023-08-19 MED ORDER — NITROFURANTOIN MONOHYD MACRO 100 MG PO CAPS
100.0000 mg | ORAL_CAPSULE | Freq: Two times a day (BID) | ORAL | 0 refills | Status: AC
Start: 2023-08-19 — End: 2023-08-27
  Filled 2023-08-19: qty 14, 7d supply, fill #0

## 2023-08-19 NOTE — Progress Notes (Signed)

## 2023-08-20 ENCOUNTER — Other Ambulatory Visit: Payer: Self-pay

## 2023-08-24 ENCOUNTER — Telehealth: Admitting: Physician Assistant

## 2023-08-24 ENCOUNTER — Other Ambulatory Visit: Payer: Self-pay

## 2023-08-24 DIAGNOSIS — B9689 Other specified bacterial agents as the cause of diseases classified elsewhere: Secondary | ICD-10-CM

## 2023-08-24 MED ORDER — AMOXICILLIN-POT CLAVULANATE 875-125 MG PO TABS
1.0000 | ORAL_TABLET | Freq: Two times a day (BID) | ORAL | 0 refills | Status: DC
  Filled 2023-08-24: qty 14, 7d supply, fill #0

## 2023-08-24 NOTE — Progress Notes (Signed)

## 2023-08-26 ENCOUNTER — Other Ambulatory Visit: Payer: Self-pay

## 2023-08-26 DIAGNOSIS — R509 Fever, unspecified: Secondary | ICD-10-CM | POA: Diagnosis not present

## 2023-08-26 DIAGNOSIS — R051 Acute cough: Secondary | ICD-10-CM | POA: Diagnosis not present

## 2023-08-26 DIAGNOSIS — J153 Pneumonia due to streptococcus, group B: Secondary | ICD-10-CM | POA: Diagnosis not present

## 2023-08-26 MED ORDER — CLARITHROMYCIN ER 500 MG PO TB24
ORAL_TABLET | ORAL | 0 refills | Status: DC
  Filled 2023-08-26: qty 14, 7d supply, fill #0

## 2023-08-26 MED ORDER — ALBUTEROL SULFATE HFA 108 (90 BASE) MCG/ACT IN AERS
INHALATION_SPRAY | RESPIRATORY_TRACT | 0 refills | Status: DC
  Filled 2023-08-26: qty 6.7, 25d supply, fill #0

## 2023-08-26 MED ORDER — METHYLPREDNISOLONE 4 MG PO TBPK
ORAL_TABLET | ORAL | 0 refills | Status: DC
  Filled 2023-08-26: qty 21, 6d supply, fill #0

## 2023-08-26 MED ORDER — BENZONATATE 100 MG PO CAPS
ORAL_CAPSULE | ORAL | 0 refills | Status: DC
  Filled 2023-08-26: qty 20, 7d supply, fill #0

## 2023-08-27 ENCOUNTER — Other Ambulatory Visit: Payer: Self-pay

## 2023-08-27 MED ORDER — CLARITHROMYCIN 500 MG PO TABS
500.0000 mg | ORAL_TABLET | Freq: Two times a day (BID) | ORAL | 0 refills | Status: DC
  Filled 2023-08-27: qty 14, 7d supply, fill #0

## 2023-08-29 ENCOUNTER — Telehealth: Payer: Self-pay | Admitting: Family Medicine

## 2023-08-29 NOTE — Telephone Encounter (Signed)
 Copied from CRM [id number]. Topic: General - Other >> Aug 28, 2023 10:06 AM Brittany Ochoa wrote: Reason for CRM: Pt states she was in urgent care on Sunday and was told that she has pneumonia, she doesn't have any PAL and is having to take FMLA.States she is gonna have her certificate of healthcare provider form sent to [phone], she states you should be able to see everything in epic.

## 2023-08-30 NOTE — Telephone Encounter (Signed)
 Reviewed with PCP. Patient needs appointment. Mychart message sent with information.

## 2023-08-30 NOTE — Telephone Encounter (Signed)
 Copied from CRM [id number]. Topic: General - Other >> Aug 29, 2023  8:48 AM Dorthula Gavel H wrote: Reason for CRM: pt is wanting to know if her provider received her FMLA paperwork

## 2023-09-06 ENCOUNTER — Telehealth: Admitting: Family Medicine

## 2023-09-19 ENCOUNTER — Other Ambulatory Visit: Payer: Self-pay

## 2023-09-19 ENCOUNTER — Ambulatory Visit (INDEPENDENT_AMBULATORY_CARE_PROVIDER_SITE_OTHER): Admitting: Urology

## 2023-09-19 VITALS — BP 122/67 | HR 80 | Ht 64.0 in | Wt 184.4 lb

## 2023-09-19 DIAGNOSIS — R300 Dysuria: Secondary | ICD-10-CM | POA: Diagnosis not present

## 2023-09-19 DIAGNOSIS — N390 Urinary tract infection, site not specified: Secondary | ICD-10-CM

## 2023-09-19 LAB — URINALYSIS, COMPLETE
Bilirubin, UA: NEGATIVE
Glucose, UA: NEGATIVE
Ketones, UA: NEGATIVE
Leukocytes,UA: NEGATIVE
Nitrite, UA: NEGATIVE
Protein,UA: NEGATIVE
RBC, UA: NEGATIVE
Specific Gravity, UA: <1.005 — ABNORMAL LOW (ref 1.005–1.030)
Urobilinogen, Ur: 0.2 mg/dL (ref 0.2–1.0)
pH, UA: 6 (ref 5.0–7.5)

## 2023-09-19 LAB — MICROSCOPIC EXAMINATION
Bacteria, UA: NONE SEEN
RBC, Urine: NONE SEEN /HPF (ref 0–2)

## 2023-09-19 LAB — BLADDER SCAN AMB NON-IMAGING: Scan Result: 83

## 2023-09-19 MED ORDER — ESTRADIOL 0.1 MG/GM VA CREA
TOPICAL_CREAM | VAGINAL | 12 refills | Status: AC
  Filled 2023-09-19: qty 42.5, 90d supply, fill #0

## 2023-09-19 NOTE — Progress Notes (Signed)
 [name],acting as a scribe for Brittany Gimenez, MD.,have documented all relevant documentation on the behalf of Brittany Gimenez, MD,as directed by  Brittany Gimenez, MD while in the presence of Brittany Gimenez, MD.  09/19/23 3:55 PM   Brittany Ochoa [birth year] [id number]  Referring provider: Audelia Leaks, MD [address] [place],  [place] [address]  Chief Complaint  Patient presents with   Follow-up   Recurrent UTI    HPI: 58 year old female with a personal history of recurrent UTIs presents today for follow-up.   She was initially evaluated for UTIs in 2023, experiencing urgency, frequency, and pelvic pain. At that time, she was started on topical estrogen cream per standard recurrent UTI protocol. She had two E. coli UTIs in 2023.   Recently, she had an e-visit for a UTI on 08/19/2023 at which time no urine was collected. Her urinalysis today is negative. A [facility] culture from 07-25-2023 showed growth of Enterococcus and E. coli. She reports managing symptoms with increased water intake and Azo, which alleviates the feeling of a UTI. She has experienced frequency, urgency, and occasional incontinence, particularly when nearing the toilet. She stopped using the estrogen cream, believing she was doing well without it, but symptoms have returned.   She also reports a prolapsed urethra diagnosed by her GYN, which she initially thought was self-inflicted through aggressive cleaning.   She is pre-diabetic and has started [employer] Pilates to improve abdominal strength and manage weight. She expresses concern about aging and its effects on her health.  PMH: Past Medical History:  Diagnosis Date   Allergy 2015   seasonal   Arthritis    Ascending aortic aneurysm (HCC)    a. 06/2018 CTA Chest: 4.1 x 4.0 cm fusiform Asc Ao aneurysm-unchanged.  Descending thoracic aorta 2.1 cm.   Asthma    exercised induced   Autonomic dysfunction with vaccines    Dyspnea on exertion 2019   a. 06/2017  echo: EF 60 to 65%.  No regional wall motion abnormalities.  Ascending aortic diameter 3.9 cm.  Mild MR.   Endometrial polyp    Gastric erythema    History of hypertension no medications since 2020    Lipoma    of neck and chest   Multiple thyroid  nodules    OCD (obsessive compulsive disorder)    Pre-diabetes    Reflux esophagitis     Surgical History: Past Surgical History:  Procedure Laterality Date   colonscopy  12/16/2021   endoscopy done also   COSMETIC SURGERY  1997   breast reduction   DILATATION & CURETTAGE/HYSTEROSCOPY WITH MYOSURE N/A 10/18/2022   Procedure: DILATATION & CURETTAGE/HYSTEROSCOPY WITH POLYPECTOMY BY MYOSURE;  Surgeon: Audelia Leaks, MD;  Location: [facility];  Service: Gynecology;  Laterality: N/A;   ESOPHAGOGASTRODUODENOSCOPY (EGD) WITH PROPOFOL  N/A 01/24/2018   Procedure: ESOPHAGOGASTRODUODENOSCOPY (EGD) WITH PROPOFOL ;  Surgeon: Irby Mannan, MD;  Location: [facility];  Service: Endoscopy;  Laterality: N/A;   ESOPHAGOGASTRODUODENOSCOPY (EGD) WITH PROPOFOL  N/A 09/09/2020   Procedure: ESOPHAGOGASTRODUODENOSCOPY (EGD) WITH PROPOFOL ;  Surgeon: Irby Mannan, MD;  Location: [facility];  Service: Endoscopy;  Laterality: N/A;   ESOPHAGOGASTRODUODENOSCOPY (EGD) WITH PROPOFOL  N/A 02/17/2021   Procedure: ESOPHAGOGASTRODUODENOSCOPY (EGD) WITH PROPOFOL ;  Surgeon: Irby Mannan, MD;  Location: [facility];  Service: Endoscopy;  Laterality: N/A;   lipoma removal  2007   back   REDUCTION MAMMAPLASTY Bilateral 12/01/1996    Home Medications:  Allergies as of 09/19/2023       Reactions  Codeine Anaphylaxis   Pholcodine    Other reaction(s): wheezing cannot ake due to aneurysm   Medical Provider Ez Flu Shot [influenza Vac Split Quad] Other (See Comments)   Severe myalgias and bone pain Any vaccine sets off dysnomia and wind up in hospital   Quinolones    Aortic enlargement    Tirzepatide     Other Reaction(s):  Hypertension/Headache sets off dysnomina        Medication List        Accurate as of September 19, 2023  3:55 PM. If you have any questions, ask your nurse or doctor.          STOP taking these medications    albuterol  108 (90 Base) MCG/ACT inhaler Commonly known as: VENTOLIN  HFA Stopped by: Brittany Ochoa   amoxicillin -clavulanate 875-125 MG tablet Commonly known as: AUGMENTIN  Stopped by: Brittany Ochoa   benzonatate  100 MG capsule Commonly known as: TESSALON  Stopped by: Brittany Ochoa   clarithromycin  500 MG tablet Commonly known as: BIAXIN  Stopped by: Brittany Ochoa   fluticasone  50 MCG/ACT nasal spray Commonly known as: FLONASE  Stopped by: Brittany Ochoa   methylPREDNISolone  4 MG Tbpk tablet Commonly known as: MEDROL  DOSEPAK Stopped by: Brittany Ochoa       TAKE these medications    estradiol  0.1 MG/GM vaginal cream Commonly known as: ESTRACE  Discard applicator. Apply pea sized amount to tip of finger to urethra before bed. Wash hands well after application. Use Monday, Wednesday and Friday. What changed: additional instructions Changed by: Brittany Ochoa   metFORMIN  500 MG 24 hr tablet Commonly known as: GLUCOPHAGE -XR Take 1 tablet (500 mg total) by mouth 2 (two) times daily.        Allergies:  Allergies  Allergen Reactions   Codeine Anaphylaxis   Pholcodine     Other reaction(s): wheezing cannot ake due to aneurysm   Medical Provider Ez Flu Shot [Influenza Vac Split Quad] Other (See Comments)    Severe myalgias and bone pain Any vaccine sets off dysnomia and wind up in hospital   Quinolones     Aortic enlargement    Tirzepatide      Other Reaction(s): Hypertension/Headache sets off dysnomina    Family History: Family History  Problem Relation Age of Onset   Diabetes Mother    Arthritis Mother    Obesity Mother    Miscarriages / [place] Mother    Diabetes Father    Heart disease Father    Heart attack Father    Stomach cancer  Father 50   Cancer Father    Asthma Brother    Anxiety disorder Brother    ADD / ADHD Brother    Osteoporosis Maternal Grandmother    Arthritis Maternal Grandmother    Varicose Veins Maternal Grandmother    Heart disease Paternal Grandmother    Stomach cancer Paternal Grandmother    Breast cancer Paternal Grandmother    Cancer Paternal Grandmother    Cancer Paternal Grandfather        spleen   Heart disease Paternal Grandfather    Alcohol abuse Maternal Uncle    Cancer Paternal Aunt        rare blood cancer; non genetic   Diabetes Paternal Aunt    Cancer Paternal Uncle        rare blood cancer; non genetic    Social History:  reports that she quit smoking about 19 years ago. Her smoking use included cigarettes. She started smoking about 44 years ago. She has a 50 pack-year smoking  history. She has been exposed to tobacco smoke. She has never used smokeless tobacco. She reports that she does not currently use alcohol. She reports that she does not use drugs.   Physical Exam: BP 122/67   Pulse 80   Ht 5\' 4"  (1.626 m)   Wt 184 lb 6 oz (83.6 kg)   LMP 06/01/2019   BMI 31.65 kg/m   Constitutional:  Alert and oriented, No acute distress. HEENT: [place] AT, moist mucus membranes.  Trachea midline, no masses. Neurologic: Grossly intact, no focal deficits, moving all 4 extremities. Psychiatric: Normal mood and affect.   Assessment & Plan:    1. Recurrent UTIs - She has a history of recurrent UTIs, with a recent negative urinalysis. She previously had E. coli infections and a [facility] culture showing E. coli and another organism.  - The discontinuation of topical estrogen may have contributed to the recurrence of symptoms.  2. Genitourinary Syndrome of Menopause - She exhibits symptoms consistent with genitourinary syndrome of menopause, including urinary urgency, frequency, and stress-induced urge incontinence.  - Advised to resume topical estrogen cream to improve urethral and  bladder health. She is advised to use the cream daily for two weeks, then reduce to three times a week.  3. 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  - The use of topical estrogen is expected to help alleviate this condition.  4. Stress-Induced Urge Incontinence - She experiences stress-induced urge incontinence, likely due to age-related changes and pelvic floor laxity.  - Suggests continuing Pilates to strengthen core muscles, which may help with symptoms.  - No pharmacological treatment is initiated at this time due to potential side effects.  Return in a few months if fail to improve  I have reviewed the above documentation for accuracy and completeness, and I agree with the above.   Brittany Gimenez, MD     [facility] [address], [address] [place], [place] [address] [phone]

## 2023-09-25 ENCOUNTER — Other Ambulatory Visit: Payer: Self-pay

## 2023-09-25 ENCOUNTER — Ambulatory Visit: Payer: Self-pay | Admitting: Family Medicine

## 2023-09-26 ENCOUNTER — Encounter: Payer: Self-pay | Admitting: Family Medicine

## 2023-09-27 ENCOUNTER — Ambulatory Visit: Admitting: Family Medicine

## 2023-10-05 ENCOUNTER — Ambulatory Visit: Admitting: Family Medicine

## 2023-10-24 ENCOUNTER — Encounter: Payer: Self-pay | Admitting: Cardiovascular Disease

## 2023-10-24 ENCOUNTER — Ambulatory Visit: Attending: Cardiovascular Disease | Admitting: Cardiovascular Disease

## 2023-10-24 VITALS — BP 120/90 | HR 75 | Ht 63.0 in | Wt 180.5 lb

## 2023-10-24 DIAGNOSIS — I10 Essential (primary) hypertension: Secondary | ICD-10-CM

## 2023-10-24 DIAGNOSIS — I7121 Aneurysm of the ascending aorta, without rupture: Secondary | ICD-10-CM | POA: Diagnosis not present

## 2023-10-24 DIAGNOSIS — R002 Palpitations: Secondary | ICD-10-CM

## 2023-10-24 NOTE — Progress Notes (Signed)
 Cardiology Office Note   Date:  10/24/2023   ID:  Brittany Ochoa, DOB 1965/05/28, MRN [id number]  PCP:  Brittany Duwaine SQUIBB, DO  Cardiologist:   Brittany Cage, MD   Chief Complaint  Patient presents with   Follow-up    Chest pain, Breathlessness at rest and fatigue/yawning continuously and 12 month f/u. Meds reviewed verbally with pt.      History of Present Illness: Brittany Ochoa is a 58 y.o. female who is here today for follow-up visit regarding palpitations and essential hypertension.  She is a previous smoker but quit in 2006.  She is known to have small ascending aortic aneurysm measuring 4.1 x 4 cm.  She sees Dr. Lucas on a yearly basis.  She had previous palpitation.  ZIO monitor in September 2020 showed normal sinus rhythm with isolated PACs.  Some of her events correlated with PACs but most of the events did not correlate with arrhythmia.  She had COVID-19 infection in December 2020.  She had chest pain in 2021 and thus underwent cardiac CTA which showed normal coronary arteries with calcium score of 0.  She had elevated blood pressure with negative work-up for secondary hypertension.  Most recent CTA in 2024 showed stable size ascending aortic aneurysm at 4.1 cm.  There was small pericardial effusion.  She had an echocardiogram done in August 2024 which showed normal LV systolic function with small pericardial effusion and mild mitral regurgitation.  She has been doing well overall.  She reports intermittent episodes of sharp chest discomfort lasting for 15 seconds happening at rest.  She exercises 3-4 times a week with no exertional symptoms.  Past Medical History:  Diagnosis Date   Allergy 2015   seasonal   Arthritis    Ascending aortic aneurysm (HCC)    a. 06/2018 CTA Chest: 4.1 x 4.0 cm fusiform Asc Ao aneurysm-unchanged.  Descending thoracic aorta 2.1 cm.   Asthma    exercised induced   Autonomic dysfunction with vaccines    Dyspnea on exertion 2019   a.  06/2017 echo: EF 60 to 65%.  No regional wall motion abnormalities.  Ascending aortic diameter 3.9 cm.  Mild MR.   Endometrial polyp    Gastric erythema    History of hypertension no medications since 2020    Lipoma    of neck and chest   Multiple thyroid  nodules    OCD (obsessive compulsive disorder)    Pre-diabetes    Reflux esophagitis     Past Surgical History:  Procedure Laterality Date   colonscopy  12/16/2021   endoscopy done also   COSMETIC SURGERY  1997   breast reduction   DILATATION & CURETTAGE/HYSTEROSCOPY WITH MYOSURE N/A 10/18/2022   Procedure: DILATATION & CURETTAGE/HYSTEROSCOPY WITH POLYPECTOMY BY MYOSURE;  Surgeon: Kandyce Sor, MD;  Location: [facility];  Service: Gynecology;  Laterality: N/A;   ESOPHAGOGASTRODUODENOSCOPY (EGD) WITH PROPOFOL  N/A 01/24/2018   Procedure: ESOPHAGOGASTRODUODENOSCOPY (EGD) WITH PROPOFOL ;  Surgeon: Janalyn Keene NOVAK, MD;  Location: [facility];  Service: Endoscopy;  Laterality: N/A;   ESOPHAGOGASTRODUODENOSCOPY (EGD) WITH PROPOFOL  N/A 09/09/2020   Procedure: ESOPHAGOGASTRODUODENOSCOPY (EGD) WITH PROPOFOL ;  Surgeon: Janalyn Keene NOVAK, MD;  Location: [facility];  Service: Endoscopy;  Laterality: N/A;   ESOPHAGOGASTRODUODENOSCOPY (EGD) WITH PROPOFOL  N/A 02/17/2021   Procedure: ESOPHAGOGASTRODUODENOSCOPY (EGD) WITH PROPOFOL ;  Surgeon: Janalyn Keene NOVAK, MD;  Location: [facility];  Service: Endoscopy;  Laterality: N/A;   lipoma removal  2007   back   REDUCTION MAMMAPLASTY  Bilateral 12/01/1996     Current Outpatient Medications  Medication Sig Dispense Refill   estradiol  (ESTRACE ) 0.1 MG/GM vaginal cream Discard applicator. Apply pea sized amount to tip of finger to urethra before bed. Wash hands well after application. Use Monday, Wednesday and Friday. 42.5 g 12   metFORMIN  (GLUCOPHAGE -XR) 500 MG 24 hr tablet Take 1 tablet (500 mg total) by mouth 2 (two) times daily. 60 tablet 6   No current  facility-administered medications for this visit.    Allergies:   Codeine, Pholcodine, Medical provider ez flu shot [influenza vac split quad], Quinolones, and Tirzepatide     Social History:  The patient  reports that she quit smoking about 19 years ago. Her smoking use included cigarettes. She started smoking about 44 years ago. She has a 50 pack-year smoking history. She has been exposed to tobacco smoke. She has never used smokeless tobacco. She reports that she does not currently use alcohol. She reports that she does not use drugs.   Family History:  The patient's family history includes ADD / ADHD in her brother; Alcohol abuse in her maternal uncle; Anxiety disorder in her brother; Arthritis in her maternal grandmother and mother; Asthma in her brother; Breast cancer in her paternal grandmother; Cancer in her father, paternal aunt, paternal grandfather, paternal grandmother, and paternal uncle; Diabetes in her father, mother, and paternal aunt; Heart attack in her father; Heart disease in her father, paternal grandfather, and paternal grandmother; Miscarriages / [place] in her mother; Obesity in her mother; Osteoporosis in her maternal grandmother; Stomach cancer in her paternal grandmother; Stomach cancer (age of onset: 27) in her father; Varicose Veins in her maternal grandmother.    ROS:  Please see the history of present illness.   Otherwise, review of systems are positive for none.   All other systems are reviewed and negative.    PHYSICAL EXAM: VS:  BP (!) 120/90 (BP Location: Left Arm, Patient Position: Sitting, Cuff Size: Normal)   Pulse 75   Ht 5' 3 (1.6 m)   Wt 180 lb 8 oz (81.9 kg)   LMP 06/01/2019   SpO2 96%   BMI 31.97 kg/m  , BMI Body mass index is 31.97 kg/m. GEN: Well nourished, well developed, in no acute distress  HEENT: normal  Neck: no JVD, carotid bruits, or masses Cardiac: RRR; no murmurs, rubs, or gallops,no edema  Respiratory:  clear to auscultation  bilaterally, normal work of breathing GI: soft, nontender, nondistended, + BS MS: no deformity or atrophy  Skin: warm and dry, no rash Neuro:  Strength and sensation are intact Psych: euthymic mood, full affect   EKG:  EKG is ordered today. The ekg demonstrates : Normal sinus rhythm Low voltage QRS When compared with ECG of 07-Nov-2022 16:47, No significant change was found    Recent Labs: 11/24/2022: ALT 17; BUN 10; Creatinine, Ser 0.81; Potassium 4.1; Sodium 140 01/29/2023: Hemoglobin 13.3; Platelets 279; TSH 0.648    Lipid Panel    Component Value Date/Time   CHOL 178 11/24/2022 1638   TRIG 112 11/24/2022 1638   HDL 45 11/24/2022 1638   CHOLHDL 4.9 05/25/2022 1214   VLDL 32 05/25/2022 1214   LDLCALC 113 (H) 11/24/2022 1638      Wt Readings from Last 3 Encounters:  10/24/23 180 lb 8 oz (81.9 kg)  09/19/23 184 lb 6 oz (83.6 kg)  01/25/23 171 lb 9.6 oz (77.8 kg)         ASSESSMENT AND PLAN:  1.  Atypical chest pain: Her current chest pain seems to be musculoskeletal.  Cardiac CTA in the past showed no significant coronary artery disease and echocardiogram last year was unremarkable.  I asked her to monitor her symptoms for now.  She exercises regularly and has no exertional symptoms.    2.  Palpitations: She had outpatient monitor done twice which showed mild premature beats as well as short runs of SVT.  No significant arrhythmia.   No significant palpitations at this time.  3.  Small ascending aortic aneurysm: Followed by Dr. Lucas.  Interestingly, she found an old MRI from 2011 that showed a ascending aortic aneurysm measuring 4.2 cm.  It appears that this has been stable over the years and I think it is reasonable to decrease the surveillance intervals to 2 to 3 years.  4.  Small pericardial effusion: This was small on CT and echocardiogram last year.   Disposition:   FU with me in 12 months  Signed,  Brittany Cage, MD  10/24/2023 4:40 PM    [facility]

## 2023-10-24 NOTE — Patient Instructions (Signed)
 Medication Instructions:  Your physician recommends that you continue on your current medications as directed. Please refer to the Current Medication list given to you today.   *If you need a refill on your cardiac medications before your next appointment, please call your pharmacy*  Lab Work: None ordered at this time  If you have labs (blood work) drawn today and your tests are completely normal, you will receive your results only by: MyChart Message (if you have MyChart) OR A paper copy in the mail If you have any lab test that is abnormal or we need to change your treatment, we will call you to review the results.  Testing/Procedures: None ordered at this time   Follow-Up: At [facility], you and your health needs are our priority.  As part of our continuing mission to provide you with exceptional heart care, our providers are all part of one team.  This team includes your primary Cardiologist (physician) and Advanced Practice Providers or APPs (Physician Assistants and Nurse Practitioners) who all work together to provide you with the care you need, when you need it.  Your next appointment:   1 year(s)  Provider:   You may see Antionette Kirks, MD or one of the following Advanced Practice Providers on your designated Care Team:   Laneta Pintos, NP Gildardo Labrador, PA-C Varney Gentleman, PA-C Cadence [place], PA-C Ronald Cockayne, NP Morey Ar, NP    We recommend signing up for the patient portal called MyChart.  Sign up information is provided on this After Visit Summary.  MyChart is used to connect with patients for Virtual Visits (Telemedicine).  Patients are able to view lab/test results, encounter notes, upcoming appointments, etc.  Non-urgent messages can be sent to your provider as well.   To learn more about what you can do with MyChart, go to [email or web address].

## 2023-11-07 ENCOUNTER — Ambulatory Visit: Admitting: Physician Assistant

## 2023-11-21 ENCOUNTER — Telehealth: Payer: Self-pay | Admitting: Internal Medicine

## 2023-11-21 NOTE — Telephone Encounter (Signed)
 Good morning Dr. Federico,   Patient is requesting to transfer her care over to Dr. Legrand. States she has a friend that is a current patient of Dr. Legrand and has been happy with care provided. Please review and advise on request.   Thank you

## 2023-11-23 NOTE — Telephone Encounter (Signed)
 Good afternoon Dr. Legrand,   Will you please review and advise on transfer of care request below.   Thank you.

## 2023-11-27 NOTE — Telephone Encounter (Signed)
 Looks like she had a thorough workup with Dr. Federico in 2023.  I do not know that I will have anything more to offer her then Dr. Federico did, but if she would like another opinion I will see her.  - HD

## 2023-11-28 NOTE — Telephone Encounter (Signed)
 Left voicemail for patient to call back.

## 2023-11-30 ENCOUNTER — Ambulatory Visit (INDEPENDENT_AMBULATORY_CARE_PROVIDER_SITE_OTHER): Payer: Self-pay | Admitting: Family Medicine

## 2023-11-30 ENCOUNTER — Encounter: Payer: Self-pay | Admitting: Family Medicine

## 2023-11-30 VITALS — BP 134/89 | HR 72 | Temp 97.6°F | Ht 63.0 in | Wt 178.0 lb

## 2023-11-30 DIAGNOSIS — D171 Benign lipomatous neoplasm of skin and subcutaneous tissue of trunk: Secondary | ICD-10-CM | POA: Diagnosis not present

## 2023-11-30 DIAGNOSIS — Z0000 Encounter for general adult medical examination without abnormal findings: Secondary | ICD-10-CM

## 2023-11-30 DIAGNOSIS — R300 Dysuria: Secondary | ICD-10-CM

## 2023-11-30 DIAGNOSIS — I10 Essential (primary) hypertension: Secondary | ICD-10-CM

## 2023-11-30 DIAGNOSIS — Z1231 Encounter for screening mammogram for malignant neoplasm of breast: Secondary | ICD-10-CM | POA: Diagnosis not present

## 2023-11-30 DIAGNOSIS — Z1283 Encounter for screening for malignant neoplasm of skin: Secondary | ICD-10-CM | POA: Diagnosis not present

## 2023-11-30 DIAGNOSIS — Z87898 Personal history of other specified conditions: Secondary | ICD-10-CM | POA: Diagnosis not present

## 2023-11-30 LAB — MICROALBUMIN, URINE WAIVED
Creatinine, Urine Waived: 50 mg/dL (ref 10–300)
Microalb, Ur Waived: 10 mg/L (ref 0–19)

## 2023-11-30 LAB — URINALYSIS, ROUTINE W REFLEX MICROSCOPIC
Bilirubin, UA: NEGATIVE
Glucose, UA: NEGATIVE
Leukocytes,UA: NEGATIVE
Nitrite, UA: NEGATIVE
Protein,UA: NEGATIVE
Specific Gravity, UA: 1.01 (ref 1.005–1.030)
Urobilinogen, Ur: 0.2 mg/dL (ref 0.2–1.0)
pH, UA: 5.5 (ref 5.0–7.5)

## 2023-11-30 LAB — MICROSCOPIC EXAMINATION
Bacteria, UA: NONE SEEN
Epithelial Cells (non renal): NONE SEEN /HPF (ref 0–10)
WBC, UA: NONE SEEN /HPF (ref 0–5)

## 2023-11-30 LAB — BAYER DCA HB A1C WAIVED: HB A1C (BAYER DCA - WAIVED): 5.7 % — ABNORMAL HIGH (ref 4.8–5.6)

## 2023-11-30 NOTE — Assessment & Plan Note (Signed)
Doing well not on medicine. Continue to monitor. Call with any concerns.

## 2023-11-30 NOTE — Progress Notes (Signed)
 BP 134/89   Pulse 72   Temp 97.6 F (36.4 C) (Oral)   Ht 5' 3 (1.6 m)   Wt 178 lb (80.7 kg)   LMP 06/01/2019   SpO2 97%   BMI 31.53 kg/m    Subjective:    Patient ID: Brittany Ochoa, female    DOB: [birth year], 58 y.o.   MRN: [id number]  HPI: Brittany Ochoa is a 58 y.o. female presenting on 11/30/2023 for comprehensive medical examination. Current medical complaints include: Several concerns. Not feeling like herself- worried something is very wrong. Will come back to discuss fully.  Depression Screen done today and results listed below:     11/30/2023    2:41 PM 09/14/2022    3:22 PM 06/08/2022    9:20 AM 04/01/2021    8:34 AM 10/08/2020    3:10 PM  Depression screen PHQ 2/9  Decreased Interest 0 0 0 1 0  Down, Depressed, Hopeless 0 0 0 2 0  PHQ - 2 Score 0 0 0 3 0  Altered sleeping 0 0 3 3   Tired, decreased energy 0 0 3 2   Change in appetite 0 0 0 2   Feeling bad or failure about yourself  0 0 0 0   Trouble concentrating 0 0 3 0   Moving slowly or fidgety/restless 0 0 0 0   Suicidal thoughts 0 0 0 0   PHQ-9 Score 0 0 9 10   Difficult doing work/chores  Not difficult at all       Past Medical History:  Past Medical History:  Diagnosis Date   Allergy 2015   seasonal   Arthritis    Ascending aortic aneurysm (HCC)    a. 06/2018 CTA Chest: 4.1 x 4.0 cm fusiform Asc Ao aneurysm-unchanged.  Descending thoracic aorta 2.1 cm.   Asthma    exercised induced   Autonomic dysfunction with vaccines    Dyspnea on exertion 2019   a. 06/2017 echo: EF 60 to 65%.  No regional wall motion abnormalities.  Ascending aortic diameter 3.9 cm.  Mild MR.   Endometrial polyp    Gastric erythema    History of hypertension no medications since 2020    Lipoma    of neck and chest   Multiple thyroid  nodules    OCD (obsessive compulsive disorder)    Pre-diabetes    Reflux esophagitis     Surgical History:  Past Surgical History:  Procedure Laterality Date   colonscopy  12/16/2021    endoscopy done also   COSMETIC SURGERY  1997   breast reduction   DILATATION & CURETTAGE/HYSTEROSCOPY WITH MYOSURE N/A 10/18/2022   Procedure: DILATATION & CURETTAGE/HYSTEROSCOPY WITH POLYPECTOMY BY MYOSURE;  Surgeon: Kandyce Sor, MD;  Location: [facility];  Service: Gynecology;  Laterality: N/A;   ESOPHAGOGASTRODUODENOSCOPY (EGD) WITH PROPOFOL  N/A 01/24/2018   Procedure: ESOPHAGOGASTRODUODENOSCOPY (EGD) WITH PROPOFOL ;  Surgeon: Janalyn Keene NOVAK, MD;  Location: [facility];  Service: Endoscopy;  Laterality: N/A;   ESOPHAGOGASTRODUODENOSCOPY (EGD) WITH PROPOFOL  N/A 09/09/2020   Procedure: ESOPHAGOGASTRODUODENOSCOPY (EGD) WITH PROPOFOL ;  Surgeon: Janalyn Keene NOVAK, MD;  Location: [facility];  Service: Endoscopy;  Laterality: N/A;   ESOPHAGOGASTRODUODENOSCOPY (EGD) WITH PROPOFOL  N/A 02/17/2021   Procedure: ESOPHAGOGASTRODUODENOSCOPY (EGD) WITH PROPOFOL ;  Surgeon: Janalyn Keene NOVAK, MD;  Location: [facility];  Service: Endoscopy;  Laterality: N/A;   lipoma removal  2007   back   REDUCTION MAMMAPLASTY Bilateral 12/01/1996    Medications:  Current Outpatient Medications on File  Prior to Visit  Medication Sig   estradiol  (ESTRACE ) 0.1 MG/GM vaginal cream Discard applicator. Apply pea sized amount to tip of finger to urethra before bed. Wash hands well after application. Use Monday, Wednesday and Friday.   metFORMIN  (GLUCOPHAGE -XR) 500 MG 24 hr tablet Take 1 tablet (500 mg total) by mouth 2 (two) times daily.   Multiple Vitamin (MULTI VITAMIN) TABS 1 tablet Orally Once a day; Duration: 30 day(s)   No current facility-administered medications on file prior to visit.    Allergies:  Allergies  Allergen Reactions   Codeine Anaphylaxis   Pholcodine     Other reaction(s): wheezing cannot ake due to aneurysm   Medical Provider Ez Flu Shot [Influenza Vac Split Quad] Other (See Comments)    Severe myalgias and bone pain Any vaccine sets off dysnomia and wind  up in hospital   Quinolones     Aortic enlargement    Tirzepatide      Other Reaction(s): Hypertension/Headache sets off dysnomina    Social History:  Social History   Socioeconomic History   Marital status: Single    Spouse name: Not on file   Number of children: Not on file   Years of education: Not on file   Highest education level: Bachelor's degree (e.g., BA, AB, BS)  Occupational History   Not on file  Tobacco Use   Smoking status: Former    Current packs/day: 0.00    Average packs/day: 2.0 packs/day for 25.0 years (50.0 ttl pk-yrs)    Types: Cigarettes    Start date: 04/18/1979    Quit date: 04/17/2004    Years since quitting: 19.6    Passive exposure: Past   Smokeless tobacco: Never  Vaping Use   Vaping status: Never Used  Substance and Sexual Activity   Alcohol use: Not Currently    Comment: Extremely rare   Drug use: No    Comment: past only   Sexual activity: Not Currently  Other Topics Concern   Not on file  Social History Narrative   Not on file   Social Drivers of Health   Financial Resource Strain: Medium Risk (11/23/2023)   Overall Financial Resource Strain (CARDIA)    Difficulty of Paying Living Expenses: Somewhat hard  Food Insecurity: No Food Insecurity (11/23/2023)   Hunger Vital Sign    Worried About Running Out of Food in the Last Year: Never true    Ran Out of Food in the Last Year: Never true  Transportation Needs: No Transportation Needs (11/23/2023)   PRAPARE - Administrator, Civil Service (Medical): No    Lack of Transportation (Non-Medical): No  Physical Activity: Sufficiently Active (11/23/2023)   Exercise Vital Sign    Days of Exercise per Week: 4 days    Minutes of Exercise per Session: 50 min  Stress: Stress Concern Present (11/23/2023)   [employer] of Occupational Health - Occupational Stress Questionnaire    Feeling of Stress: To some extent  Social Connections: Unknown (11/23/2023)   Social Connection and Isolation  Panel    Frequency of Communication with Friends and Family: More than three times a week    Frequency of Social Gatherings with Friends and Family: Once a week    Attends Religious Services: More than 4 times per year    Active Member of [employer] or Organizations: No    Attends Banker Meetings: Not on file    Marital Status: Patient declined  Intimate Partner Violence: Not At Risk (11/30/2023)  Humiliation, Afraid, Rape, and Kick questionnaire    Fear of Current or Ex-Partner: No    Emotionally Abused: No    Physically Abused: No    Sexually Abused: No   Social History   Tobacco Use  Smoking Status Former   Current packs/day: 0.00   Average packs/day: 2.0 packs/day for 25.0 years (50.0 ttl pk-yrs)   Types: Cigarettes   Start date: 04/18/1979   Quit date: 04/17/2004   Years since quitting: 19.6   Passive exposure: Past  Smokeless Tobacco Never   Social History   Substance and Sexual Activity  Alcohol Use Not Currently   Comment: Extremely rare    Family History:  Family History  Problem Relation Age of Onset   Diabetes Mother    Arthritis Mother    Obesity Mother    Miscarriages / Stillbirths Mother    Diabetes Father    Heart disease Father    Heart attack Father    Stomach cancer Father 33   Cancer Father    Asthma Brother    Anxiety disorder Brother    ADD / ADHD Brother    Osteoporosis Maternal Grandmother    Arthritis Maternal Grandmother    Varicose Veins Maternal Grandmother    Heart disease Paternal Grandmother    Stomach cancer Paternal Grandmother    Breast cancer Paternal Grandmother    Cancer Paternal Grandmother    Cancer Paternal Grandfather        spleen   Heart disease Paternal Grandfather    Alcohol abuse Maternal Uncle    Cancer Paternal Aunt        rare blood cancer; non genetic   Diabetes Paternal Aunt    Cancer Paternal Uncle        rare blood cancer; non genetic   Cancer Paternal Aunt    Alcohol abuse Maternal Uncle      Past medical history, surgical history, medications, allergies, family history and social history reviewed with patient today and changes made to appropriate areas of the chart.   Review of Systems  Constitutional:  Positive for diaphoresis and malaise/fatigue. Negative for chills, fever and weight loss.  HENT:  Positive for hearing loss. Negative for congestion, ear discharge, ear pain, nosebleeds, sinus pain, sore throat and tinnitus.   Eyes:  Positive for blurred vision. Negative for double vision, photophobia, pain, discharge and redness.  Respiratory:  Positive for shortness of breath. Negative for cough, hemoptysis, sputum production, wheezing and stridor.   Cardiovascular:  Positive for palpitations. Negative for chest pain, orthopnea, claudication, leg swelling and PND.  Gastrointestinal:  Positive for abdominal pain, constipation, diarrhea, heartburn and nausea. Negative for blood in stool, melena and vomiting.  Genitourinary: Negative.   Musculoskeletal: Negative.   Skin: Negative.   Neurological:  Positive for dizziness. Negative for tingling, tremors, sensory change, speech change, focal weakness, seizures, loss of consciousness, weakness and headaches.  Endo/Heme/Allergies: Negative.   Psychiatric/Behavioral:  Positive for depression. Negative for substance abuse and suicidal ideas.    All other ROS negative except what is listed above and in the HPI.      Objective:    BP 134/89   Pulse 72   Temp 97.6 F (36.4 C) (Oral)   Ht 5' 3 (1.6 m)   Wt 178 lb (80.7 kg)   LMP 06/01/2019   SpO2 97%   BMI 31.53 kg/m   Wt Readings from Last 3 Encounters:  11/30/23 178 lb (80.7 kg)  10/24/23 180 lb 8 oz (81.9 kg)  09/19/23 184 lb 6 oz (83.6 kg)    Physical Exam Vitals and nursing note reviewed.  Constitutional:      General: She is not in acute distress.    Appearance: Normal appearance. She is not ill-appearing, toxic-appearing or diaphoretic.  HENT:     Head:  Normocephalic and atraumatic.     Right Ear: Tympanic membrane, ear canal and external ear normal. There is no impacted cerumen.     Left Ear: Tympanic membrane, ear canal and external ear normal. There is no impacted cerumen.     Nose: Nose normal. No congestion or rhinorrhea.     Mouth/Throat:     Mouth: Mucous membranes are moist.     Pharynx: Oropharynx is clear. No oropharyngeal exudate or posterior oropharyngeal erythema.  Eyes:     General: No scleral icterus.       Right eye: No discharge.        Left eye: No discharge.     Extraocular Movements: Extraocular movements intact.     Conjunctiva/sclera: Conjunctivae normal.     Pupils: Pupils are equal, round, and reactive to light.  Neck:     Vascular: No carotid bruit.  Cardiovascular:     Rate and Rhythm: Normal rate and regular rhythm.     Pulses: Normal pulses.     Heart sounds: No murmur heard.    No friction rub. No gallop.  Pulmonary:     Effort: Pulmonary effort is normal. No respiratory distress.     Breath sounds: Normal breath sounds. No stridor. No wheezing, rhonchi or rales.  Chest:     Chest wall: No tenderness.  Abdominal:     General: Abdomen is flat. Bowel sounds are normal. There is no distension.     Palpations: Abdomen is soft. There is no mass.     Tenderness: There is no abdominal tenderness. There is no right CVA tenderness, left CVA tenderness, guarding or rebound.     Hernia: No hernia is present.  Genitourinary:    Comments: Breast and pelvic exams deferred with shared decision making Musculoskeletal:        General: No swelling, tenderness, deformity or signs of injury.     Cervical back: Normal range of motion and neck supple. No rigidity. No muscular tenderness.     Right lower leg: No edema.     Left lower leg: No edema.  Lymphadenopathy:     Cervical: No cervical adenopathy.  Skin:    General: Skin is warm and dry.     Capillary Refill: Capillary refill takes less than 2 seconds.      Coloration: Skin is not jaundiced or pale.     Findings: No bruising, erythema, lesion or rash.  Neurological:     General: No focal deficit present.     Mental Status: She is alert and oriented to person, place, and time. Mental status is at baseline.     Cranial Nerves: No cranial nerve deficit.     Sensory: No sensory deficit.     Motor: No weakness.     Coordination: Coordination normal.     Gait: Gait normal.     Deep Tendon Reflexes: Reflexes normal.  Psychiatric:        Mood and Affect: Mood normal.        Behavior: Behavior normal.        Thought Content: Thought content normal.        Judgment: Judgment normal.     Results for orders  placed or performed in visit on 09/19/23  Microscopic Examination   Collection Time: 09/19/23  2:55 PM   Urine  Result Value Ref Range   WBC, UA 0-5 0 - 5 /hpf   RBC, Urine None seen 0 - 2 /hpf   Epithelial Cells (non renal) 0-10 0 - 10 /hpf   Bacteria, UA None seen None seen/Few  Urinalysis, Complete   Collection Time: 09/19/23  2:55 PM  Result Value Ref Range   Specific Gravity, UA <1.005 (L) 1.005 - 1.030   pH, UA 6.0 5.0 - 7.5   Color, UA Yellow Yellow   Appearance Ur Clear Clear   Leukocytes,UA Negative Negative   Protein,UA Negative Negative/Trace   Glucose, UA Negative Negative   Ketones, UA Negative Negative   RBC, UA Negative Negative   Bilirubin, UA Negative Negative   Urobilinogen, Ur 0.2 0.2 - 1.0 mg/dL   Nitrite, UA Negative Negative   Microscopic Examination Comment    Microscopic Examination See below:   Bladder Scan (Post Void Residual) in office   Collection Time: 09/19/23  3:08 PM  Result Value Ref Range   Scan Result 83 ml       Assessment & Plan:   Problem List Items Addressed This Visit       Cardiovascular and Mediastinum   Essential hypertension   Doing well not on medicine. Continue to monitor. Call with any concerns.       Relevant Orders   Microalbumin, Urine Waived   Other Visit Diagnoses        Routine general medical examination at a health care facility    -  Primary   Vaccines up to date/declined/allergic. Screening labs checked today. Pap up to date. Mammo ordered. Colonoscopy UTD. Continue diet/exercise. Call w/ concerns.   Relevant Orders   CBC with Differential/Platelet   Comprehensive metabolic panel with GFR   Lipid Panel w/o Chol/HDL Ratio   Hepatitis B surface antibody,quantitative   Bayer DCA Hb A1c Waived   Tetanus antibody, IgG   Thyroid  Panel With TSH   Thyroid  antibodies (Thyroperoxidase & Thyroglobulin)   VITAMIN D  25 Hydroxy (Vit-D Deficiency, Fractures)   B12   Ferritin     Dysuria       Resolved with estogen cream. UA clear today.   Relevant Orders   Urinalysis, Routine w reflex microscopic     Screening for skin cancer       Referral to dermatology placed today.   Relevant Orders   Ambulatory referral to Dermatology     Lipoma of torso       Referral to general surgery placed today.   Relevant Orders   Ambulatory referral to General Surgery     History of exposure to cat feces       Labs drawn today at patient's request. Await results.   Relevant Orders   Toxoplasma gondii antibody, IgM     Encounter for screening mammogram for malignant neoplasm of breast       Mammogram ordered today.   Relevant Orders   MM 3D SCREENING MAMMOGRAM BILATERAL BREAST        Follow up plan: Return for OK to book on the PM of 8/27 (40 min please).   LABORATORY TESTING:  - Pap smear: up to date  IMMUNIZATIONS:   - Tdap: Tetanus vaccination status reviewed: last tetanus booster within 10 years. - Influenza: Allergic - Pneumovax: Refused - Prevnar: Refused - COVID: Refused - HPV: Not applicable - Shingrix vaccine:  Refused  SCREENING: -Mammogram: Ordered today  - Colonoscopy: Up to date   PATIENT COUNSELING:   Advised to take 1 mg of folate supplement per day if capable of pregnancy.   Sexuality: Discussed sexually transmitted diseases,  partner selection, use of condoms, avoidance of unintended pregnancy  and contraceptive alternatives.   Advised to avoid cigarette smoking.  I discussed with the patient that most people either abstain from alcohol or drink within safe limits (<=14/week and <=4 drinks/occasion for males, <=7/weeks and <= 3 drinks/occasion for females) and that the risk for alcohol disorders and other health effects rises proportionally with the number of drinks per week and how often a drinker exceeds daily limits.  Discussed cessation/primary prevention of drug use and availability of treatment for abuse.   Diet: Encouraged to adjust caloric intake to maintain  or achieve ideal body weight, to reduce intake of dietary saturated fat and total fat, to limit sodium intake by avoiding high sodium foods and not adding table salt, and to maintain adequate dietary potassium and calcium preferably from fresh fruits, vegetables, and low-fat dairy products.    stressed the importance of regular exercise  Injury prevention: Discussed safety belts, safety helmets, smoke detector, smoking near bedding or upholstery.   Dental health: Discussed importance of regular tooth brushing, flossing, and dental visits.    NEXT PREVENTATIVE PHYSICAL DUE IN 1 YEAR. Return for OK to book on the PM of 8/27 (40 min please).

## 2023-11-30 NOTE — Patient Instructions (Signed)
 Please call to schedule your mammogram and/or bone density: [facility] at [facility]  Address: [address], [place], [place] [address] Phone: [phone]  [facility] at [facility] [address] [place],  [place]  [address] Phone: [phone]

## 2023-12-04 LAB — COMPREHENSIVE METABOLIC PANEL WITH GFR
ALT: 18 IU/L (ref 0–32)
AST: 28 IU/L (ref 0–40)
Albumin: 4.6 g/dL (ref 3.8–4.9)
Alkaline Phosphatase: 94 IU/L (ref 44–121)
BUN/Creatinine Ratio: 12 (ref 9–23)
BUN: 8 mg/dL (ref 6–24)
Bilirubin Total: 0.7 mg/dL (ref 0.0–1.2)
CO2: 22 mmol/L (ref 20–29)
Calcium: 9.9 mg/dL (ref 8.7–10.2)
Chloride: 100 mmol/L (ref 96–106)
Creatinine, Ser: 0.67 mg/dL (ref 0.57–1.00)
Globulin, Total: 2.2 g/dL (ref 1.5–4.5)
Glucose: 83 mg/dL (ref 70–99)
Potassium: 3.9 mmol/L (ref 3.5–5.2)
Sodium: 140 mmol/L (ref 134–144)
Total Protein: 6.8 g/dL (ref 6.0–8.5)
eGFR: 103 mL/min/1.73 (ref >59)

## 2023-12-04 LAB — CBC WITH DIFFERENTIAL/PLATELET
Basophils Absolute: 0.1 x10E3/uL (ref 0.0–0.2)
Basos: 1 %
EOS (ABSOLUTE): 0.2 x10E3/uL (ref 0.0–0.4)
Eos: 4 %
Hematocrit: 40.7 % (ref 34.0–46.6)
Hemoglobin: 13.5 g/dL (ref 11.1–15.9)
Immature Grans (Abs): 0 x10E3/uL (ref 0.0–0.1)
Immature Granulocytes: 0 %
Lymphocytes Absolute: 1.5 x10E3/uL (ref 0.7–3.1)
Lymphs: 27 %
MCH: 29.9 pg (ref 26.6–33.0)
MCHC: 33.2 g/dL (ref 31.5–35.7)
MCV: 90 fL (ref 79–97)
Monocytes Absolute: 0.4 x10E3/uL (ref 0.1–0.9)
Monocytes: 6 %
Neutrophils Absolute: 3.5 x10E3/uL (ref 1.4–7.0)
Neutrophils: 62 %
Platelets: 256 x10E3/uL (ref 150–450)
RBC: 4.51 x10E6/uL (ref 3.77–5.28)
RDW: 12.7 % (ref 11.7–15.4)
WBC: 5.7 x10E3/uL (ref 3.4–10.8)

## 2023-12-04 LAB — THYROID PANEL WITH TSH
Free Thyroxine Index: 2.4 (ref 1.2–4.9)
T3 Uptake Ratio: 23 % — ABNORMAL LOW (ref 24–39)
T4, Total: 10.5 ug/dL (ref 4.5–12.0)
TSH: 0.358 u[IU]/mL — ABNORMAL LOW (ref 0.450–4.500)

## 2023-12-04 LAB — VITAMIN B12: Vitamin B-12: 410 pg/mL (ref 232–1245)

## 2023-12-04 LAB — TOXOPLASMA GONDII ANTIBODY, IGM: Toxoplasma Antibody- IgM: <3 [AU]/ml (ref 0.0–7.9)

## 2023-12-04 LAB — LIPID PANEL W/O CHOL/HDL RATIO
Cholesterol, Total: 215 mg/dL — ABNORMAL HIGH (ref 100–199)
HDL: 45 mg/dL (ref >39)
LDL Chol Calc (NIH): 148 mg/dL — ABNORMAL HIGH (ref 0–99)
Triglycerides: 122 mg/dL (ref 0–149)
VLDL Cholesterol Cal: 22 mg/dL (ref 5–40)

## 2023-12-04 LAB — THYROID ANTIBODIES (THYROPEROXIDASE & THYROGLOBULIN)
Thyroglobulin Antibody: <1 [IU]/mL (ref 0.0–0.9)
Thyroperoxidase Ab SerPl-aCnc: 15 [IU]/mL (ref 0–34)

## 2023-12-04 LAB — HEPATITIS B SURFACE ANTIBODY, QUANTITATIVE: Hepatitis B Surf Ab Quant: <3.5 m[IU]/mL — ABNORMAL LOW

## 2023-12-04 LAB — INFECT DISEASE AB IGM REFLEX 1

## 2023-12-04 LAB — FERRITIN: Ferritin: 188 ng/mL — ABNORMAL HIGH (ref 15–150)

## 2023-12-04 LAB — TETANUS ANTIBODY, IGG: Tetanus Ab, IgG: 1.94 [IU]/mL (ref <0.10)

## 2023-12-04 LAB — VITAMIN D 25 HYDROXY (VIT D DEFICIENCY, FRACTURES): Vit D, 25-Hydroxy: 25.8 ng/mL — ABNORMAL LOW (ref 30.0–100.0)

## 2023-12-06 ENCOUNTER — Other Ambulatory Visit: Payer: Self-pay

## 2023-12-06 DIAGNOSIS — M25551 Pain in right hip: Secondary | ICD-10-CM | POA: Insufficient documentation

## 2023-12-06 DIAGNOSIS — M545 Low back pain, unspecified: Secondary | ICD-10-CM | POA: Insufficient documentation

## 2023-12-06 DIAGNOSIS — M25552 Pain in left hip: Secondary | ICD-10-CM | POA: Diagnosis not present

## 2023-12-06 MED ORDER — METHOCARBAMOL 500 MG PO TABS
1000.0000 mg | ORAL_TABLET | Freq: Four times a day (QID) | ORAL | 1 refills | Status: DC
  Filled 2023-12-06: qty 240, 30d supply, fill #0

## 2023-12-12 ENCOUNTER — Encounter: Payer: Self-pay | Admitting: Family Medicine

## 2023-12-12 ENCOUNTER — Ambulatory Visit: Admitting: Family Medicine

## 2023-12-12 VITALS — BP 120/84 | HR 80 | Ht 63.0 in | Wt 176.6 lb

## 2023-12-12 DIAGNOSIS — R195 Other fecal abnormalities: Secondary | ICD-10-CM

## 2023-12-12 DIAGNOSIS — R410 Disorientation, unspecified: Secondary | ICD-10-CM

## 2023-12-12 DIAGNOSIS — M25552 Pain in left hip: Secondary | ICD-10-CM | POA: Diagnosis not present

## 2023-12-12 NOTE — Progress Notes (Signed)
 BP 120/84   Pulse 80   Ht 5' 3 (1.6 m)   Wt 176 lb 9.6 oz (80.1 kg)   LMP 06/01/2019   SpO2 98%   BMI 31.28 kg/m    Subjective:    Patient ID: Brittany Ochoa, female    DOB: 1966/03/11, 58 y.o.   MRN: [id number]  HPI: Brittany Ochoa is a 58 y.o. female  Chief Complaint  Patient presents with   Fatigue   Has not been doing well.   Has the lipoma in her neck- has been really concerned abou tit. She notes that she has had some asymmetry under her arm. She had some spots in her L breast aroind the time she did her mounjaro  shots. She is concerned that it may be causing issues with her lymph drainage. Has an appointment with general surgery  Has had issues with severe fatigue since about March has been in bed at [facility]. Does get up to go to pilates in the AM. Feels better in the AM after she's been sleeping for 12 hours  Has had issues wih erh hair falling out. Used castor oil and that seems like it helped.   Went to see urology- has been doing the estradiol  cream for her overactive bladder. Sometimes she'll drink all day and she won't pee. Sometimes her urine is really concentratioed and sometiems its not. Her urine is really stinky. Not sure why- it smells really concentrated and is dark.  Has a knot on her back that she's concerned about and not sure if there's something going on with her kidneys.   She is very concerned about her brain. She is concerned that she may have had a stroke or sometihng going on with early onset alzehimers. She notes that someone will say something to her and within 5 minutes she won't remember that they talked to her. She won't recognize images, things dont look right. She will get tongue tied and won't be able to say things. Sometimes she feels like she doesn't understand what people are saying to her. She notes that she has talked to the wrong niece without realizing it, has had issues with understanding instructions at pilates. She notes that she forgot  that she had a trip booked that she was talking about. Seems to be worse around the end of the day.  Has been passing things in her stool that she thinks are parasites. She's been seeing strings and has been able to pick them out of her stool.   Relevant past medical, surgical, family and social history reviewed and updated as indicated. Interim medical history since our last visit reviewed. Allergies and medications reviewed and updated.  Review of Systems  Constitutional:  Positive for fatigue. Negative for activity change, appetite change, chills, diaphoresis, fever and unexpected weight change.  Respiratory: Negative.    Cardiovascular: Negative.   Gastrointestinal: Negative.   Genitourinary: Negative.   Musculoskeletal: Negative.   Skin: Negative.   Neurological:  Positive for dizziness, speech difficulty and headaches. Negative for tremors, seizures, syncope, facial asymmetry, weakness, light-headedness and numbness.  Psychiatric/Behavioral:  Positive for confusion and decreased concentration. Negative for agitation, behavioral problems, dysphoric mood, hallucinations, self-injury, sleep disturbance and suicidal ideas. The patient is not nervous/anxious and is not hyperactive.     Per HPI unless specifically indicated above     Objective:    BP 120/84   Pulse 80   Ht 5' 3 (1.6 m)   Wt 176 lb 9.6 oz (  80.1 kg)   LMP 06/01/2019   SpO2 98%   BMI 31.28 kg/m   Wt Readings from Last 3 Encounters:  12/12/23 176 lb 9.6 oz (80.1 kg)  11/30/23 178 lb (80.7 kg)  10/24/23 180 lb 8 oz (81.9 kg)    Physical Exam Vitals and nursing note reviewed.  Constitutional:      General: She is not in acute distress.    Appearance: Normal appearance. She is not ill-appearing, toxic-appearing or diaphoretic.  HENT:     Head: Normocephalic and atraumatic.     Right Ear: External ear normal.     Left Ear: External ear normal.     Nose: Nose normal.     Mouth/Throat:     Mouth: Mucous  membranes are moist.     Pharynx: Oropharynx is clear.  Eyes:     General: No scleral icterus.       Right eye: No discharge.        Left eye: No discharge.     Extraocular Movements: Extraocular movements intact.     Conjunctiva/sclera: Conjunctivae normal.     Pupils: Pupils are equal, round, and reactive to light.  Cardiovascular:     Rate and Rhythm: Normal rate and regular rhythm.     Pulses: Normal pulses.     Heart sounds: Normal heart sounds. No murmur heard.    No friction rub. No gallop.  Pulmonary:     Effort: Pulmonary effort is normal. No respiratory distress.     Breath sounds: Normal breath sounds. No stridor. No wheezing, rhonchi or rales.  Chest:     Chest wall: No tenderness.  Musculoskeletal:        General: Normal range of motion.     Cervical back: Normal range of motion and neck supple.  Skin:    General: Skin is warm and dry.     Capillary Refill: Capillary refill takes less than 2 seconds.     Coloration: Skin is not jaundiced or pale.     Findings: No bruising, erythema, lesion or rash.  Neurological:     General: No focal deficit present.     Mental Status: She is alert and oriented to person, place, and time. Mental status is at baseline.  Psychiatric:        Mood and Affect: Mood normal.        Behavior: Behavior normal.        Thought Content: Thought content normal.        Judgment: Judgment normal.     Results for orders placed or performed in visit on 12/12/23  ATN PROFILE   Collection Time: 12/12/23  3:49 PM  Result Value Ref Range   A -- Beta-amyloid 42/40 Ratio 0.121 >0.102   Beta-amyloid 42 27.11 pg/mL   Beta-amyloid 40 224.31 pg/mL   T -- [id number] 0.54 0.00 - 0.97 pg/mL   N -- NfL, Plasma 1.07 0.00 - 2.99 pg/mL   ATN SUMMARY Comment    Information: Comment       Assessment & Plan:   Problem List Items Addressed This Visit       Nervous and Auditory   Confusion with non-focal neuro exam - Primary   Will check MRI of her  brain. Will get her neuropsych testing. Will check ATN profile. Await results. Continue to monitor closely.      Relevant Orders   MR Brain W Wo Contrast (Completed)   Ambulatory referral to Neuropsychology   ATN PROFILE (Completed)  Other Visit Diagnoses       Abnormal stools       Will check stool studied including ova and parasites. Await results. Treat as needed.   Relevant Orders   Ova and parasite examination   Ova and parasite examination   Ova and parasite examination   Stool culture   Fecal leukocytes   Stool C-Diff Toxin Assay   H. pylori antigen, stool     Left hip pain       Will get her into sports medicine. Appointment scheduled for patient. Call with any concerns.   Relevant Orders   Ambulatory referral to Sports Medicine        Follow up plan: Return in about 4 weeks (around 01/09/2024).   >40 minutes spent with patient today

## 2023-12-15 LAB — ATN PROFILE
A -- Beta-amyloid 42/40 Ratio: 0.121 (ref >0.102)
Beta-amyloid 40: 224.31 pg/mL
Beta-amyloid 42: 27.11 pg/mL
N -- NfL, Plasma: 1.07 pg/mL (ref 0.00–2.99)
T -- p-tau181: 0.54 pg/mL (ref 0.00–0.97)

## 2023-12-18 ENCOUNTER — Other Ambulatory Visit: Payer: Self-pay

## 2023-12-18 ENCOUNTER — Encounter: Payer: Self-pay | Admitting: Family Medicine

## 2023-12-18 ENCOUNTER — Ambulatory Visit
Admission: RE | Admit: 2023-12-18 | Discharge: 2023-12-18 | Disposition: A | Discharge status: Home / Self Care | Source: Ambulatory Visit | Attending: Family Medicine | Admitting: Family Medicine

## 2023-12-18 ENCOUNTER — Ambulatory Visit: Payer: Self-pay | Admitting: Family Medicine

## 2023-12-18 DIAGNOSIS — R410 Disorientation, unspecified: Secondary | ICD-10-CM | POA: Diagnosis not present

## 2023-12-18 DIAGNOSIS — R42 Dizziness and giddiness: Secondary | ICD-10-CM | POA: Diagnosis not present

## 2023-12-18 DIAGNOSIS — R220 Localized swelling, mass and lump, head: Secondary | ICD-10-CM | POA: Diagnosis not present

## 2023-12-18 MED ORDER — GADOBUTROL 1 MMOL/ML IV SOLN
8.0000 mL | Freq: Once | INTRAVENOUS | Status: AC | PRN
  Administered 2023-12-18: 8 mL via INTRAVENOUS

## 2023-12-19 ENCOUNTER — Other Ambulatory Visit: Payer: Self-pay | Admitting: Family Medicine

## 2023-12-19 ENCOUNTER — Telehealth: Payer: Self-pay

## 2023-12-19 ENCOUNTER — Ambulatory Visit (INDEPENDENT_AMBULATORY_CARE_PROVIDER_SITE_OTHER): Admitting: Family Medicine

## 2023-12-19 DIAGNOSIS — G9389 Other specified disorders of brain: Secondary | ICD-10-CM

## 2023-12-19 NOTE — Telephone Encounter (Signed)
 Ok for E2C2 to review.  Please transfer call to CAL.

## 2023-12-19 NOTE — Progress Notes (Unsigned)
   LMP 06/01/2019    Subjective:    Patient ID: Brittany Ochoa, female    DOB: November 02, 1965, 57 y.o.   MRN: [id number]  HPI: Brittany Ochoa is a 58 y.o. female  No chief complaint on file.   Relevant past medical, surgical, family and social history reviewed and updated as indicated. Interim medical history since our last visit reviewed. Allergies and medications reviewed and updated.  Review of Systems  Constitutional:  Positive for fatigue. Negative for activity change, appetite change, chills, diaphoresis, fever and unexpected weight change.  Respiratory: Negative.    Cardiovascular: Negative.   Musculoskeletal: Negative.   Neurological:  Positive for dizziness. Negative for tremors, seizures, syncope, facial asymmetry, speech difficulty, weakness, light-headedness, numbness and headaches.  Psychiatric/Behavioral:  Positive for confusion and decreased concentration. Negative for agitation, behavioral problems, dysphoric mood, hallucinations, self-injury, sleep disturbance and suicidal ideas. The patient is not nervous/anxious and is not hyperactive.     Per HPI unless specifically indicated above     Objective:    LMP 06/01/2019   Wt Readings from Last 3 Encounters:  12/12/23 176 lb 9.6 oz (80.1 kg)  11/30/23 178 lb (80.7 kg)  10/24/23 180 lb 8 oz (81.9 kg)    Physical Exam Vitals and nursing note reviewed.  Pulmonary:     Effort: Pulmonary effort is normal. No respiratory distress.     Comments: Speaking in full sentences Neurological:     Mental Status: She is alert.  Psychiatric:        Mood and Affect: Mood normal.        Behavior: Behavior normal.        Thought Content: Thought content normal.        Judgment: Judgment normal.     Results for orders placed or performed in visit on 12/12/23  ATN PROFILE   Collection Time: 12/12/23  3:49 PM  Result Value Ref Range   A -- Beta-amyloid 42/40 Ratio 0.121 >0.102   Beta-amyloid 42 27.11 pg/mL   Beta-amyloid 40  224.31 pg/mL   T -- p-tau181 0.54 0.00 - 0.97 pg/mL   N -- NfL, Plasma 1.07 0.00 - 2.99 pg/mL   ATN SUMMARY Comment    Information: Comment       Assessment & Plan:   Problem List Items Addressed This Visit   None Visit Diagnoses       Mass of frontal lobe    -  Primary        Follow up plan: No follow-ups on file.

## 2023-12-20 ENCOUNTER — Encounter: Payer: Self-pay | Admitting: Family Medicine

## 2023-12-20 DIAGNOSIS — G9389 Other specified disorders of brain: Secondary | ICD-10-CM | POA: Insufficient documentation

## 2023-12-20 NOTE — Assessment & Plan Note (Signed)
 Imaging suggestive of meningomas. Patient is not too anxious about results- declines short term benzo course. Referral to neurosurgery placed, she will see them ASAP.

## 2023-12-23 DIAGNOSIS — R410 Disorientation, unspecified: Secondary | ICD-10-CM | POA: Insufficient documentation

## 2023-12-23 NOTE — Assessment & Plan Note (Signed)
 Will check MRI of her brain. Will get her neuropsych testing. Will check ATN profile. Await results. Continue to monitor closely.

## 2023-12-26 ENCOUNTER — Ambulatory Visit (INDEPENDENT_AMBULATORY_CARE_PROVIDER_SITE_OTHER): Admitting: Surgery

## 2023-12-26 ENCOUNTER — Encounter: Payer: Self-pay | Admitting: Surgery

## 2023-12-26 VITALS — BP 133/78 | HR 80 | Ht 64.0 in | Wt 179.0 lb

## 2023-12-26 DIAGNOSIS — D170 Benign lipomatous neoplasm of skin and subcutaneous tissue of head, face and neck: Secondary | ICD-10-CM

## 2023-12-26 NOTE — Patient Instructions (Signed)
 We placed a order for you to have a CT scan of the neck, we will schedule it and call you once we get this schedule and to follow up with Dr Desiderio   Lipoma  A lipoma is a noncancerous (benign) tumor that is made up of fat cells. This is a very common type of soft-tissue growth. Lipomas are usually found under the skin (subcutaneous). They may occur in any tissue of the body that contains fat. Common areas for lipomas to appear include the back, arms, shoulders, buttocks, and thighs. Lipomas grow slowly, and they are usually painless. Most lipomas do not cause problems and do not require treatment. What are the causes? The cause of this condition is not known. What increases the risk? You are more likely to develop this condition if: You are 53-44 years old. You have a family history of lipomas. What are the signs or symptoms? A lipoma usually appears as a small, round bump under the skin. In most cases, the lump will: Feel soft or rubbery. Not cause pain or other symptoms. However, if a lipoma is located in an area where it pushes on nerves, it can become painful or cause other symptoms. How is this diagnosed? A lipoma can usually be diagnosed with a physical exam. You may also have tests to confirm the diagnosis and to rule out other conditions. Tests may include: Imaging tests, such as a CT scan or an MRI. Removal of a tissue sample to be looked at under a microscope (biopsy). How is this treated? Treatment for this condition depends on the size of the lipoma and whether it is causing any symptoms. For small lipomas that are not causing problems, no treatment is needed. If a lipoma is bigger or it causes problems, surgery may be done to remove the lipoma. Lipomas can also be removed to improve appearance. Most often, the procedure is done after applying a medicine that numbs the area (local anesthetic). Liposuction may be done to reduce the size of the lipoma before it is removed through  surgery, or it may be done to remove the lipoma. Lipomas are removed with this method to limit incision size and scarring. A liposuction tube is inserted through a small incision into the lipoma, and the contents of the lipoma are removed through the tube with suction. Follow these instructions at home: Watch your lipoma for any changes. Keep all follow-up visits. This is important. Where to find more information OrthoInfo: orthoinfo.aaos.org Contact a health care provider if: Your lipoma becomes larger or hard. Your lipoma becomes painful, red, or increasingly swollen. These could be signs of infection or a more serious condition. Get help right away if: You develop tingling or numbness in an area near the lipoma. This could indicate that the lipoma is causing nerve damage. Summary A lipoma is a noncancerous tumor that is made up of fat cells. Most lipomas do not cause problems and do not require treatment. If a lipoma is bigger or it causes problems, surgery may be done to remove the lipoma. Contact a health care provider if your lipoma becomes larger or hard, or if it becomes painful, red, or increasingly swollen. These could be signs of infection or a more serious condition. This information is not intended to replace advice given to you by your health care provider. Make sure you discuss any questions you have with your health care provider. Document Revised: 04/22/2021 Document Reviewed: 04/22/2021 Elsevier Patient Education  2024 [employer].

## 2023-12-26 NOTE — Progress Notes (Signed)
 12/26/2023  Reason for Visit:  Lipoma of left neck  Requesting Provider:  Duwaine Louder, DO  History of Present Illness: Brittany Ochoa is a 58 y.o. female presenting for evaluation of a left neck lipoma.  The patient reports that she has had this for many years since at least 2008.  However over time this has been growing in size.  She had a CT scan of the neck in 2022 which showed a 4.9 x 4 x 5 cm mass in the left supraclavicular neck likely to be a lipoma.  She was also told in the past that she has a lipoma in the chest cavity that is nonresectable.  I do not have any specific images for this.  The patient reports that over the years, this mass in the left neck has continued to grow.  It has resulted in sometimes feeling of tingliness in the left neck area and shoulder area.  She does feel this more aggravated as she is stretching for instance rotating her neck towards the right.  She also feels that it has contributed to some swelling in her left arm.  Separately, the patient also reports having a lipoma in the left forearm as well as in the right back.  The forearm lipoma is also symptomatic but she does not have any issues with the back lipoma.  Of note, the patient also reports that most recently she has been diagnosed with meningiomas after having an MRI of the brain on 12/18/2023.  She is meeting with neurosurgery on 12/28/2023 to discuss these findings and any potential treatment options.  Past Medical History: Past Medical History:  Diagnosis Date   Allergy 2015   seasonal   Arthritis    Ascending aortic aneurysm (HCC)    a. 06/2018 CTA Chest: 4.1 x 4.0 cm fusiform Asc Ao aneurysm-unchanged.  Descending thoracic aorta 2.1 cm.   Asthma    exercised induced   Autonomic dysfunction with vaccines    Dyspnea on exertion 2019   a. 06/2017 echo: EF 60 to 65%.  No regional wall motion abnormalities.  Ascending aortic diameter 3.9 cm.  Mild MR.   Endometrial polyp    Gastric erythema     History of hypertension no medications since 2020    Lipoma    of neck and chest   Multiple thyroid  nodules    OCD (obsessive compulsive disorder)    Pre-diabetes    Reflux esophagitis      Past Surgical History: Past Surgical History:  Procedure Laterality Date   colonscopy  12/16/2021   endoscopy done also   COSMETIC SURGERY  1997   breast reduction   DILATATION & CURETTAGE/HYSTEROSCOPY WITH MYOSURE N/A 10/18/2022   Procedure: DILATATION & CURETTAGE/HYSTEROSCOPY WITH POLYPECTOMY BY MYOSURE;  Surgeon: Kandyce Sor, MD;  Location: [facility] [place];  Service: Gynecology;  Laterality: N/A;   ESOPHAGOGASTRODUODENOSCOPY (EGD) WITH PROPOFOL  N/A 01/24/2018   Procedure: ESOPHAGOGASTRODUODENOSCOPY (EGD) WITH PROPOFOL ;  Surgeon: Janalyn Keene NOVAK, MD;  Location: [facility];  Service: Endoscopy;  Laterality: N/A;   ESOPHAGOGASTRODUODENOSCOPY (EGD) WITH PROPOFOL  N/A 09/09/2020   Procedure: ESOPHAGOGASTRODUODENOSCOPY (EGD) WITH PROPOFOL ;  Surgeon: Janalyn Keene NOVAK, MD;  Location: [facility];  Service: Endoscopy;  Laterality: N/A;   ESOPHAGOGASTRODUODENOSCOPY (EGD) WITH PROPOFOL  N/A 02/17/2021   Procedure: ESOPHAGOGASTRODUODENOSCOPY (EGD) WITH PROPOFOL ;  Surgeon: Janalyn Keene NOVAK, MD;  Location: [facility];  Service: Endoscopy;  Laterality: N/A;   lipoma removal  2007   back   REDUCTION MAMMAPLASTY Bilateral 12/01/1996  Home Medications: Prior to Admission medications   Medication Sig Start Date End Date Taking? Authorizing Provider  estradiol  (ESTRACE ) 0.1 MG/GM vaginal cream Discard applicator. Apply pea sized amount to tip of finger to urethra before bed. Wash hands well after application. Use Monday, Wednesday and Friday. 09/19/23  Yes Penne Knee, MD  metFORMIN  (GLUCOPHAGE -XR) 500 MG 24 hr tablet Take 1 tablet (500 mg total) by mouth 2 (two) times daily. 05/14/23  Yes   methocarbamol  (ROBAXIN ) 500 MG tablet Take 2 tablets (1,000 mg total) by mouth 4  (four) times daily. Patient not taking: Reported on 12/12/2023 12/06/23       Allergies: Allergies  Allergen Reactions   Codeine Anaphylaxis   Pholcodine     Other reaction(s): wheezing cannot ake due to aneurysm   Medical Provider Ez Flu Shot [Influenza Vac Split Quad] Other (See Comments)    Severe myalgias and bone pain Any vaccine sets off dysnomia and wind up in hospital   Quinolones     Aortic enlargement    Tirzepatide      Other Reaction(s): Hypertension/Headache sets off dysnomina    Social History:  reports that she quit smoking about 19 years ago. Her smoking use included cigarettes. She started smoking about 44 years ago. She has a 50 pack-year smoking history. She has been exposed to tobacco smoke. She has never used smokeless tobacco. She reports that she does not currently use alcohol. She reports that she does not use drugs.   Family History: Family History  Problem Relation Age of Onset   Diabetes Mother    Arthritis Mother    Obesity Mother    Miscarriages / [place] Mother    Diabetes Father    Heart disease Father    Heart attack Father    Stomach cancer Father 82   Cancer Father    Asthma Brother    Anxiety disorder Brother    ADD / ADHD Brother    Osteoporosis Maternal Grandmother    Arthritis Maternal Grandmother    Varicose Veins Maternal Grandmother    Heart disease Paternal Grandmother    Stomach cancer Paternal Grandmother    Breast cancer Paternal Grandmother    Cancer Paternal Grandmother    Cancer Paternal Grandfather        spleen   Heart disease Paternal Grandfather    Alcohol abuse Maternal Uncle    Cancer Paternal Aunt        rare blood cancer; non genetic   Diabetes Paternal Aunt    Cancer Paternal Uncle        rare blood cancer; non genetic   Cancer Paternal Aunt    Alcohol abuse Maternal Uncle     Review of Systems: Review of Systems  Constitutional:  Negative for chills and fever.  Respiratory:  Negative for shortness  of breath.   Cardiovascular:  Negative for chest pain.  Gastrointestinal:  Negative for nausea and vomiting.  Musculoskeletal:        Mild left upper extremity swelling  Skin:        Mass in the left lower neck  Neurological:  Positive for tingling (left neck).    Physical Exam BP 133/78   Pulse 80   Ht 5' 4 (1.626 m)   Wt 179 lb (81.2 kg)   LMP 06/01/2019   SpO2 98%   BMI 30.73 kg/m  CONSTITUTIONAL: No acute distress, well-nourished HEENT:  Normocephalic, atraumatic, extraocular motion intact. RESPIRATORY:  Normal respiratory effort without pathologic use of accessory muscles.  CARDIOVASCULAR: Regular rhythm and rate. MUSCULOSKELETAL: The patient has a 2.5 x 3 cm mass in the left forearm which is soft, mobile, and nontender, consistent with a lipoma.  She does have some noticeable although mild edema of the left upper extremity compared to the right upper extremity.  The patient has an approximately 7 cm mass in the left lower neck at the level of the clavicle consistent with prior findings on imaging with this lipoma.  It is soft and nontender.  He does change and protuberance depending how she moves her neck.  However I am unable to fully identify the edges of the mass.  The patient also has a 2 cm mass in the right back which is also soft, mobile, nontender consistent also with a lipoma. SKIN: Skin turgor is normal. There are no pathologic skin lesions.  NEUROLOGIC:  Motor and sensation is grossly normal.  Cranial nerves are grossly intact. PSYCH:  Alert and oriented to person, place and time. Affect is normal.  Laboratory Analysis: No results found for this or any previous visit (from the past 24 hours).  Imaging: CT neck on 04/15/2021: IMPRESSION: 1. 4.9 x 4.0 x 5.0 cm fat-density lesion in the left supraclavicular neck with at least 1 curvilinear septation. This likely represents a lipoma. Given the adjacent adenopathy and septation, liposarcoma is not excluded. 2. 7 mm  nodule in the right lobe of the thyroid . No follow-up imaging is recommended. Reference: J Am Coll Radiol. 2015 Feb;12(2): 143-50 3. Focal endplate degenerative changes at C5-6 with osseous foraminal narrowing right greater than left.  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  - The patient reports that the lipoma that mostly concerns her given the symptoms is the lipoma of the left lower neck.  She also is symptomatic in the left forearm but the right back lipoma is not causing her any issues.  She would like to at least address the 1 in the left lower neck.  Given the issues that it is causing such as tingliness as well as some swelling in the left arm, I think it would be prudent to obtain a repeat CT scan of the neck to further evaluate the size of this mass and also evaluate how deep is going and whether it is abutting any of the vascular structures of the thoracic inlet.  I think if it is that deep, it would be more prudent to refer her to either ENT versus vascular surgery for excision.  The patient is in agreement with this. - Patient is meeting with neurosurgery on 12/28/2023 to discuss the meningiomas found on her brain MRI.  I think this would also be taking priority as far as timing for surgery.  The patient will keep us  posted on the workup and any treatment plans that she will be going through in regards to the meningiomas. - Patient will follow-up with me after CT scan of the neck is done.  All of her questions have been answered.  I spent 30 minutes dedicated to the care of this patient on the date of this encounter to include pre-visit review of records, face-to-face time with the patient discussing diagnosis and management, and any post-visit coordination of care.   Aloysius Sheree Plant, MD  [facility]

## 2023-12-27 ENCOUNTER — Encounter: Payer: Self-pay | Admitting: Family Medicine

## 2023-12-27 ENCOUNTER — Other Ambulatory Visit: Payer: Self-pay

## 2023-12-27 ENCOUNTER — Encounter: Admitting: Family Medicine

## 2023-12-27 ENCOUNTER — Ambulatory Visit: Admitting: Family Medicine

## 2023-12-27 VITALS — BP 118/84 | HR 96 | Ht 64.0 in | Wt 179.2 lb

## 2023-12-27 DIAGNOSIS — M4726 Other spondylosis with radiculopathy, lumbar region: Secondary | ICD-10-CM | POA: Diagnosis not present

## 2023-12-27 MED ORDER — MELOXICAM 15 MG PO TABS
15.0000 mg | ORAL_TABLET | Freq: Every day | ORAL | 1 refills | Status: DC
  Filled 2023-12-27: qty 30, 30d supply, fill #0
  Filled 2024-01-31: qty 30, 30d supply, fill #1

## 2023-12-27 MED ORDER — METHOCARBAMOL 500 MG PO TABS
500.0000 mg | ORAL_TABLET | Freq: Three times a day (TID) | ORAL | 1 refills | Status: DC | PRN
  Filled 2023-12-27: qty 30, 10d supply, fill #0

## 2023-12-28 ENCOUNTER — Ambulatory Visit (INDEPENDENT_AMBULATORY_CARE_PROVIDER_SITE_OTHER): Admitting: Neurosurgery

## 2023-12-28 ENCOUNTER — Encounter: Payer: Self-pay | Admitting: Neurosurgery

## 2023-12-28 VITALS — BP 120/86 | HR 87 | Ht 64.0 in | Wt 177.8 lb

## 2023-12-28 DIAGNOSIS — D329 Benign neoplasm of meninges, unspecified: Secondary | ICD-10-CM | POA: Diagnosis not present

## 2023-12-28 NOTE — Progress Notes (Unsigned)
 Assessment : 58 year old lady with an insignificant past medical history who for the past few months has been having significant memory problems.  She is also being more confused.  As these symptoms progressed she talked to her primary care doctor and MRI of the brain was obtained.  Patient does not have any history of malignancies and does not complain of any headaches or seizure-like activity.  Patient was accompanied by her best friend and works in our radiation oncology department.  Plan : I reviewed the MRI and this shows 1 parasagittal and 1 right-sided convexity small meningiomas without any FLAIR signal surrounding them.  I congratulated her with the news that these are benign lesions and do not need to be removed.  She was under the impression that these 2 meningiomas were the cause of her memory problems but I told her that it is highly unlikely.  I told her that if she was my wife, I would not recommend removing these but I recommended that we get a 1 year follow-up MRI with and without contrast and go from there.  She is comfortable with that plan and she is going to talk to her primary care doctor to arrange a referral to this a neurologist for concentration.   Social History   Socioeconomic History   Marital status: Single    Spouse name: Not on file   Number of children: Not on file   Years of education: Not on file   Highest education level: Bachelor's degree (e.g., BA, AB, BS)  Occupational History   Not on file  Tobacco Use   Smoking status: Former    Current packs/day: 0.00    Average packs/day: 2.0 packs/day for 25.0 years (50.0 ttl pk-yrs)    Types: Cigarettes    Start date: 04/18/1979    Quit date: 04/17/2004    Years since quitting: 19.7    Passive exposure: Past   Smokeless tobacco: Never  Vaping Use   Vaping status: Never Used  Substance and Sexual Activity   Alcohol use: Not Currently    Comment: Extremely rare   Drug use: No    Comment: past only    Sexual activity: Not Currently  Other Topics Concern   Not on file  Social History Narrative   Not on file   Social Drivers of Health   Financial Resource Strain: Medium Risk (11/23/2023)   Overall Financial Resource Strain (CARDIA)    Difficulty of Paying Living Expenses: Somewhat hard  Food Insecurity: No Food Insecurity (11/23/2023)   Hunger Vital Sign    Worried About Running Out of Food in the Last Year: Never true    Ran Out of Food in the Last Year: Never true  Transportation Needs: No Transportation Needs (11/23/2023)   PRAPARE - Administrator, Civil Service (Medical): No    Lack of Transportation (Non-Medical): No  Physical Activity: Sufficiently Active (11/23/2023)   Exercise Vital Sign    Days of Exercise per Week: 4 days    Minutes of Exercise per Session: 50 min  Stress: Stress Concern Present (11/23/2023)   [employer] of Occupational Health - Occupational Stress Questionnaire    Feeling of Stress: To some extent  Social Connections: Unknown (11/23/2023)   Social Connection and Isolation Panel    Frequency of Communication with Friends and Family: More than three times a week    Frequency of Social Gatherings with Friends and Family: Once a week    Attends Religious Services: More than  4 times per year    Active Member of Clubs or Organizations: No    Attends Banker Meetings: Not on file    Marital Status: Patient declined  Intimate Partner Violence: Not At Risk (11/30/2023)   Humiliation, Afraid, Rape, and Kick questionnaire    Fear of Current or Ex-Partner: No    Emotionally Abused: No    Physically Abused: No    Sexually Abused: No    Family History  Problem Relation Age of Onset   Diabetes Mother    Arthritis Mother    Obesity Mother    Miscarriages / Stillbirths Mother    Diabetes Father    Heart disease Father    Heart attack Father    Stomach cancer Father 7   Cancer Father    Asthma Brother    Anxiety disorder Brother     ADD / ADHD Brother    Osteoporosis Maternal Grandmother    Arthritis Maternal Grandmother    Varicose Veins Maternal Grandmother    Heart disease Paternal Grandmother    Stomach cancer Paternal Grandmother    Breast cancer Paternal Grandmother    Cancer Paternal Grandmother    Cancer Paternal Grandfather        spleen   Heart disease Paternal Grandfather    Alcohol abuse Maternal Uncle    Cancer Paternal Aunt        rare blood cancer; non genetic   Diabetes Paternal Aunt    Cancer Paternal Uncle        rare blood cancer; non genetic   Cancer Paternal Aunt    Alcohol abuse Maternal Uncle     Allergies  Allergen Reactions   Codeine Anaphylaxis   Pholcodine     Other reaction(s): wheezing cannot ake due to aneurysm   Medical Provider Ez Flu Shot [Influenza Vac Split Quad] Other (See Comments)    Severe myalgias and bone pain Any vaccine sets off dysnomia and wind up in hospital   Quinolones     Aortic enlargement    Tirzepatide      Other Reaction(s): Hypertension/Headache sets off dysnomina    Past Medical History:  Diagnosis Date   Allergy 2015   seasonal   Arthritis    Ascending aortic aneurysm (HCC)    a. 06/2018 CTA Chest: 4.1 x 4.0 cm fusiform Asc Ao aneurysm-unchanged.  Descending thoracic aorta 2.1 cm.   Asthma    exercised induced   Autonomic dysfunction with vaccines    Dyspnea on exertion 2019   a. 06/2017 echo: EF 60 to 65%.  No regional wall motion abnormalities.  Ascending aortic diameter 3.9 cm.  Mild MR.   Endometrial polyp    Gastric erythema    History of hypertension no medications since 2020    Lipoma    of neck and chest   Multiple thyroid  nodules    OCD (obsessive compulsive disorder)    Pre-diabetes    Reflux esophagitis     Past Surgical History:  Procedure Laterality Date   colonscopy  12/16/2021   endoscopy done also   COSMETIC SURGERY  1997   breast reduction   DILATATION & CURETTAGE/HYSTEROSCOPY WITH MYOSURE N/A 10/18/2022    Procedure: DILATATION & CURETTAGE/HYSTEROSCOPY WITH POLYPECTOMY BY MYOSURE;  Surgeon: Kandyce Sor, MD;  Location: [facility];  Service: Gynecology;  Laterality: N/A;   ESOPHAGOGASTRODUODENOSCOPY (EGD) WITH PROPOFOL  N/A 01/24/2018   Procedure: ESOPHAGOGASTRODUODENOSCOPY (EGD) WITH PROPOFOL ;  Surgeon: Janalyn Keene NOVAK, MD;  Location: [facility];  Service:  Endoscopy;  Laterality: N/A;   ESOPHAGOGASTRODUODENOSCOPY (EGD) WITH PROPOFOL  N/A 09/09/2020   Procedure: ESOPHAGOGASTRODUODENOSCOPY (EGD) WITH PROPOFOL ;  Surgeon: Janalyn Keene NOVAK, MD;  Location: [facility];  Service: Endoscopy;  Laterality: N/A;   ESOPHAGOGASTRODUODENOSCOPY (EGD) WITH PROPOFOL  N/A 02/17/2021   Procedure: ESOPHAGOGASTRODUODENOSCOPY (EGD) WITH PROPOFOL ;  Surgeon: Janalyn Keene NOVAK, MD;  Location: [facility];  Service: Endoscopy;  Laterality: N/A;   lipoma removal  2007   back   REDUCTION MAMMAPLASTY Bilateral 12/01/1996     Physical Exam HENT:     Head: Normocephalic.     Nose: Nose normal.  Eyes:     Pupils: Pupils are equal, round, and reactive to light.  Cardiovascular:     Rate and Rhythm: Normal rate.  Pulmonary:     Effort: Pulmonary effort is normal.  Abdominal:     General: Abdomen is flat.  Musculoskeletal:     Cervical back: Normal range of motion.  Neurological:     Mental Status: She is alert.     Cranial Nerves: Cranial nerves 2-12 are intact.     Sensory: Sensation is intact.     Motor: Motor function is intact.     Coordination: Coordination is intact.        Results for orders placed or performed during the hospital encounter of 12/18/23  MR Brain W Wo Contrast   Narrative   CLINICAL DATA:  Provided history: Confusion with non-focal neuro exam. Memory changes. Dizziness.  EXAM: MRI HEAD WITHOUT AND WITH CONTRAST  TECHNIQUE: Multiplanar, multiecho pulse sequences of the brain and surrounding structures were obtained without and with intravenous  contrast.  CONTRAST:  8mL GADAVIST  GADOBUTROL  1 MMOL/ML IV SOLN  COMPARISON:  Non-contrast head CT  and CT angiogram head 04/30/2017.  FINDINGS: Brain:  Cerebral volume is normal.  8 mm enhancing extra-axial mass along the right frontoparietal convexity (for instance, as seen on series 18, image 122). 12 mm enhancing extra-axial mass overlying the high posterior right frontal lobe (for instance, as seen on series 20, image 12). These are most consistent with meningiomas. No significant mass effect upon the relying brain parenchyma. No underlying parenchymal edema.  Small chronic infarct within the right cerebellar hemisphere.  No cortical encephalomalacia is identified. No significant cerebral white matter disease.  There is no acute infarct.  No chronic intracranial blood products.  No extra-axial fluid collection.  No midline shift.  Vascular: Maintained flow voids within the proximal large arterial vessels.  Skull and upper cervical spine: No focal worrisome marrow lesion. Incompletely assessed cervical spondylosis.  Sinuses/Orbits: No mass or acute finding within the imaged orbits. No significant paranasal sinus disease.  Impression #1 will be called to the ordering clinician or representative by the Radiologist Assistant, and communication documented in the PACS or [employer].  IMPRESSION: 1. 12 mm extra-axial mass along the high posterior right frontal lobe. Additional 8 mm extra-axial mass along the right frontoparietal convexity. These are most consistent with meningiomas. No significant mass effect upon the underlying brain parenchyma. No underlying parenchymal edema. 2. Small chronic infarct within the right cerebellar hemisphere. 3. Otherwise unremarkable MRI appearance of the brain.   Electronically Signed   By: Rockey Childs D.O.   On: 12/19/2023 09:08

## 2023-12-30 ENCOUNTER — Ambulatory Visit
Admission: RE | Admit: 2023-12-30 | Discharge: 2023-12-30 | Disposition: A | Discharge status: Home / Self Care | Source: Ambulatory Visit | Attending: Emergency Medicine | Admitting: Emergency Medicine

## 2023-12-30 VITALS — BP 127/87 | HR 85 | Temp 98.3°F | Resp 18

## 2023-12-30 DIAGNOSIS — J069 Acute upper respiratory infection, unspecified: Secondary | ICD-10-CM

## 2023-12-30 MED ORDER — AZITHROMYCIN 250 MG PO TABS: 250.0000 mg | ORAL_TABLET | Freq: Every day | ORAL | 0 refills | Status: DC

## 2023-12-30 NOTE — ED Triage Notes (Addendum)
 Patient reports cough with greenish-yellowish sputum,  sore throat, nasal congestion and ear fullness x 5 days. Patient has been taken OTC medication with no relief. Patient cannot recall the names of OTC medication. Patient just recently traveled to [place] . Rates pain 9/10. Patient took 3 covid/flu test results was negative.

## 2023-12-30 NOTE — ED Provider Notes (Signed)
 Brittany Ochoa    CSN: [id number] Arrival date & time: 12/30/23  1108      History   Chief Complaint Chief Complaint  Patient presents with   Sore Throat    Sinus congestion, ear pain, cough with congestion. No fever. Symptoms started Wednesday 9/10. Tested three times for covid/ flu with last test taken today, all negative. - Entered by patient   Cough   Ear Fullness   Nasal Congestion    HPI Brittany Ochoa is a 58 y.o. female.   Patient presents for eval ration of nasal congestion, sore throat, bilateral ear fullness and a productive cough with green to yellow sputum present for 5 days.  Experiencing chills primarily at nighttime.  Associated nausea and watery diarrhea.  Endorses an episode of vomiting with cough.  Recent travel to [place] .  Has completed home COVID and flu testing 3 times, results negative.  Denies fever, shortness of breath or wheezing.  Appetite decreased but tolerable to some food and liquids.  Has attempted use of Mucinex.  Past Medical History:  Diagnosis Date   Allergy 2015   seasonal   Arthritis    Ascending aortic aneurysm (HCC)    a. 06/2018 CTA Chest: 4.1 x 4.0 cm fusiform Asc Ao aneurysm-unchanged.  Descending thoracic aorta 2.1 cm.   Asthma    exercised induced   Autonomic dysfunction with vaccines    Dyspnea on exertion 2019   a. 06/2017 echo: EF 60 to 65%.  No regional wall motion abnormalities.  Ascending aortic diameter 3.9 cm.  Mild MR.   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   Low back pain 12/06/2023   Pain in right hip 12/06/2023   Hair loss 01/25/2023   Cough with congestion of paranasal sinus  01/25/2023   Urinary symptom or sign 05/03/2021   Gastric intestinal metaplasia    Hepatic steatosis 10/08/2020   Dysautonomia (HCC) 10/08/2020   Gastric erythema    Goiter 08/25/2020   Autoimmune disease (HCC) 05/27/2019   Menopause 10/04/2018   Esophageal dysphagia    Multiple thyroid  nodules 07/15/2017   History of radiation exposure 07/15/2017   IFG (impaired fasting glucose) 05/04/2017   Ascending aortic aneurysm (HCC) 05/04/2017   Pineal gland cyst 09/28/2016    Past Surgical History:  Procedure Laterality Date   colonscopy  12/16/2021   endoscopy done also   COSMETIC SURGERY  1997   breast reduction   DILATATION & CURETTAGE/HYSTEROSCOPY WITH MYOSURE N/A 10/18/2022   Procedure: DILATATION & CURETTAGE/HYSTEROSCOPY WITH POLYPECTOMY BY MYOSURE;  Surgeon: Kandyce Sor, MD;  Location: [facility];  Service: Gynecology;  Laterality: N/A;   ESOPHAGOGASTRODUODENOSCOPY (EGD) WITH PROPOFOL  N/A 01/24/2018   Procedure: ESOPHAGOGASTRODUODENOSCOPY (EGD) WITH PROPOFOL ;  Surgeon: Janalyn Keene NOVAK, MD;  Location: [facility];  Service: Endoscopy;  Laterality: N/A;   ESOPHAGOGASTRODUODENOSCOPY (EGD) WITH PROPOFOL  N/A 09/09/2020   Procedure: ESOPHAGOGASTRODUODENOSCOPY (EGD) WITH PROPOFOL ;  Surgeon: Janalyn Keene NOVAK, MD;  Location: [facility];  Service: Endoscopy;  Laterality: N/A;   ESOPHAGOGASTRODUODENOSCOPY (EGD) WITH PROPOFOL  N/A 02/17/2021   Procedure: ESOPHAGOGASTRODUODENOSCOPY (EGD) WITH  PROPOFOL ;  Surgeon: Janalyn Keene NOVAK, MD;  Location: [facility];  Service: Endoscopy;  Laterality: N/A;   lipoma removal  2007   back   REDUCTION MAMMAPLASTY Bilateral 12/01/1996    OB History     Gravida  0   Para      Term      Preterm      AB      Living         SAB      IAB      Ectopic      Multiple      Live Births           Obstetric Comments  Menstrual age: 80   Age 1st Pregnancy: 16            Home Medications     Prior to Admission medications   Medication Sig Start Date End Date Taking? Authorizing Provider  azithromycin  (ZITHROMAX ) 250 MG tablet Take 1 tablet (250 mg total) by mouth daily. Take first 2 tablets together, then 1 every day until finished. 12/30/23  Yes Luigi Stuckey R, NP  estradiol  (ESTRACE ) 0.1 MG/GM vaginal cream Discard applicator. Apply pea sized amount to tip of finger to urethra before bed. Wash hands well after application. Use Monday, Wednesday and Friday. 09/19/23   Penne Knee, MD  meloxicam  (MOBIC ) 15 MG tablet Take 1 tablet (15 mg total) by mouth daily. X1-2 weeks then daily prn 12/27/23   Matthews, Jason J, MD  metFORMIN  (GLUCOPHAGE -XR) 500 MG 24 hr tablet Take 1 tablet (500 mg total) by mouth 2 (two) times daily. 05/14/23     methocarbamol  (ROBAXIN ) 500 MG tablet Take 1 tablet (500 mg total) by mouth every 8 (eight) hours as needed for muscle spasms. 12/27/23   Alvia Selinda PARAS, MD    Family History Family History  Problem Relation Age of Onset   Diabetes Mother    Arthritis Mother    Obesity Mother    Miscarriages / [place] Mother    Diabetes Father    Heart disease Father    Heart attack Father    Stomach cancer Father 4   Cancer Father    Asthma Brother    Anxiety disorder Brother    ADD / ADHD Brother    Osteoporosis Maternal Grandmother    Arthritis Maternal Grandmother    Varicose Veins Maternal Grandmother    Heart disease Paternal Grandmother    Stomach cancer Paternal Grandmother    Breast cancer Paternal Grandmother    Cancer Paternal Grandmother    Cancer Paternal Grandfather        spleen   Heart disease Paternal Grandfather    Alcohol abuse Maternal Uncle    Cancer Paternal Aunt        rare blood cancer; non genetic   Diabetes Paternal Aunt    Cancer Paternal Uncle        rare blood cancer; non genetic   Cancer Paternal Aunt    Alcohol abuse Maternal Uncle     Social History Social History   Tobacco Use   Smoking status:  Former    Current packs/day: 0.00    Average packs/day: 2.0 packs/day for 25.0 years (50.0 ttl pk-yrs)    Types: Cigarettes    Start date: 04/18/1979    Quit date: 04/17/2004    Years since quitting: 19.7    Passive exposure: Past   Smokeless tobacco: Never  Vaping Use   Vaping status: Never Used  Substance Use  Topics   Alcohol use: Not Currently    Comment: Extremely rare   Drug use: No    Comment: past only     Allergies   Codeine, Pholcodine, Medical provider ez flu shot [influenza vac split quad], Quinolones, and Tirzepatide    Review of Systems Review of Systems   Physical Exam Triage Vital Signs ED Triage Vitals  Encounter Vitals Group     BP 12/30/23 1125 127/87     Girls Systolic BP Percentile --      Girls Diastolic BP Percentile --      Boys Systolic BP Percentile --      Boys Diastolic BP Percentile --      Pulse Rate 12/30/23 1125 85     Resp 12/30/23 1125 18     Temp 12/30/23 1125 98.3 F (36.8 C)     Temp Source 12/30/23 1125 Oral     SpO2 12/30/23 1125 97 %     Weight --      Height --      Head Circumference --      Peak Flow --      Pain Score 12/30/23 1121 9     Pain Loc --      Pain Education --      Exclude from Growth Chart --    No data found.  Updated Vital Signs BP 127/87 (BP Location: Right Arm)   Pulse 85   Temp 98.3 F (36.8 C) (Oral)   Resp 18   LMP 06/01/2019   SpO2 97%   Visual Acuity Right Eye Distance:   Left Eye Distance:   Bilateral Distance:    Right Eye Near:   Left Eye Near:    Bilateral Near:     Physical Exam Constitutional:      Appearance: Normal appearance.  HENT:     Head: Normocephalic.     Right Ear: Tympanic membrane, ear canal and external ear normal.     Left Ear: Tympanic membrane, ear canal and external ear normal.     Nose: Congestion present.     Mouth/Throat:     Mouth: Mucous membranes are moist.     Pharynx: Oropharynx is clear. No posterior oropharyngeal erythema.  Eyes:      Extraocular Movements: Extraocular movements intact.  Cardiovascular:     Rate and Rhythm: Normal rate and regular rhythm.     Pulses: Normal pulses.     Heart sounds: Normal heart sounds.  Pulmonary:     Effort: Pulmonary effort is normal.     Breath sounds: Normal breath sounds.  Musculoskeletal:     Cervical back: Normal range of motion and neck supple.  Neurological:     Mental Status: She is alert and oriented to person, place, and time. Mental status is at baseline.      UC Treatments / Results  Labs (all labs ordered are listed, but only abnormal results are displayed) Labs Reviewed - No data to display  EKG   Radiology No results found.  Procedures Procedures (including critical care time)  Medications Ordered in UC Medications - No data to display  Initial Impression / Assessment and Plan / UC Course  I have reviewed the triage vital signs and the nursing notes.  Pertinent labs & imaging results that were available during my care of the patient were reviewed by me and considered in my medical decision making (see chart for details).  Viral URI with cough  Patient is in no signs of  distress nor toxic appearing.  Vital signs are stable.  Low suspicion for pneumonia, pneumothorax or bronchitis and therefore will defer imaging.  Defer viral testing, completed 3 times at home, testing negative.  Etiology most likely viral due to timeline of sickness, discussed this with patient empirically placed on azithromycin  as she is requesting antibiotics as she believes her symptoms are getting worse.May use additional over-the-counter medications as needed for supportive care.  May follow-up with urgent care as needed if symptoms persist or worsen.  Note given.   Final Clinical Impressions(s) / UC Diagnoses   Final diagnoses:  Viral URI with cough     Discharge Instructions      Your symptoms today are most likely being caused by a virus and should steadily improve in  time it can take up to 7 to 10 days before you truly start to see a turnaround however things will get better  Take azithromycin  as directed to ideally prevent symptoms from progressing or worsening    You can take Tylenol  and/or Ibuprofen  as needed for fever reduction and pain relief.   For cough: honey 1/2 to 1 teaspoon (you can dilute the honey in water or another fluid).  You can also use guaifenesin and dextromethorphan for cough. You can use a humidifier for chest congestion and cough.  If you don't have a humidifier, you can sit in the bathroom with the hot shower running.      For sore throat: try warm salt water gargles, cepacol lozenges, throat spray, warm tea or water with lemon/honey, popsicles or ice, or OTC cold relief medicine for throat discomfort.   For congestion: take a daily anti-histamine like Zyrtec, Claritin, and a oral decongestant, such as pseudoephedrine.  You can also use Flonase  1-2 sprays in each nostril daily.   It is important to stay hydrated: drink plenty of fluids (water, gatorade/powerade/pedialyte, juices, or teas) to keep your throat moisturized and help further relieve irritation/discomfort.    ED Prescriptions     Medication Sig Dispense Auth. Provider   azithromycin  (ZITHROMAX ) 250 MG tablet Take 1 tablet (250 mg total) by mouth daily. Take first 2 tablets together, then 1 every day until finished. 6 tablet Karson Chicas R, NP      PDMP not reviewed this encounter.   Teresa Shelba SAUNDERS, NP 12/30/23 1224

## 2023-12-30 NOTE — Discharge Instructions (Signed)
 Your symptoms today are most likely being caused by a virus and should steadily improve in time it can take up to 7 to 10 days before you truly start to see a turnaround however things will get better  Take azithromycin  as directed to ideally prevent symptoms from progressing or worsening    You can take Tylenol  and/or Ibuprofen  as needed for fever reduction and pain relief.   For cough: honey 1/2 to 1 teaspoon (you can dilute the honey in water or another fluid).  You can also use guaifenesin and dextromethorphan for cough. You can use a humidifier for chest congestion and cough.  If you don't have a humidifier, you can sit in the bathroom with the hot shower running.      For sore throat: try warm salt water gargles, cepacol lozenges, throat spray, warm tea or water with lemon/honey, popsicles or ice, or OTC cold relief medicine for throat discomfort.   For congestion: take a daily anti-histamine like Zyrtec, Claritin, and a oral decongestant, such as pseudoephedrine.  You can also use Flonase  1-2 sprays in each nostril daily.   It is important to stay hydrated: drink plenty of fluids (water, gatorade/powerade/pedialyte, juices, or teas) to keep your throat moisturized and help further relieve irritation/discomfort.

## 2023-12-31 ENCOUNTER — Ambulatory Visit: Payer: Self-pay

## 2023-12-31 NOTE — Telephone Encounter (Signed)
 FYI Only or Action Required?: FYI only for provider.  Patient was last seen in primary care on 12/27/2023 by Brittany Selinda PARAS, MD.  Called Nurse Triage reporting Sinusitis.  Symptoms began a week ago.  Interventions attempted: Prescription medications: zithromax .  Symptoms are: unchanged.  Triage Disposition: Home Care  Patient/caregiver understands and will follow disposition?: Yes  Copied from CRM [id number]. Topic: Clinical - Red Word Triage >> Dec 31, 2023  4:48 PM Sophia H wrote: Red Word that prompted transfer to Nurse Triage:   Patient states has lots of pressure in ears, sinus pressure, cough & sharp pain on side of neck when swallowing, discolored phlegm.Was seen in UC yesterday and given meds but not helping Reason for Disposition  [1] Taking antibiotic < 72 hours (3 days) AND [2] sinus pain not improved  Answer Assessment - Initial Assessment Questions Patient is concerned that she has strep throat and she was not swabbed at UC one day ago  1. ANTIBIOTIC: What antibiotic are you taking? How many times a day?     Azythromycin 2. ONSET: When was the antibiotic started?     One day ago 12/30/2023 3. PAIN: How bad is the pain?   (Scale 0-10; or none, mild, moderate or severe)     Sore throat pain, feels like a little razor blade on the left side 4. FEVER: Do you have a fever? If Yes, ask: What is it, how was it measured, and when did it start?      Has had chills, but no measured fever 5. SYMPTOMS: Are there any other symptoms you're concerned about? If Yes, ask: When did it start?     Sore throat 6. PREGNANCY: Is there any chance you are pregnant? When was your last menstrual period?     N/A  Protocols used: Sinus Infection on Antibiotic Follow-up Call-A-[facility]

## 2024-01-01 ENCOUNTER — Encounter: Payer: Self-pay | Admitting: Family Medicine

## 2024-01-02 ENCOUNTER — Ambulatory Visit
Admission: RE | Admit: 2024-01-02 | Discharge: 2024-01-02 | Disposition: A | Discharge status: Home / Self Care | Source: Ambulatory Visit | Attending: Surgery | Admitting: Surgery

## 2024-01-02 DIAGNOSIS — D1722 Benign lipomatous neoplasm of skin and subcutaneous tissue of left arm: Secondary | ICD-10-CM | POA: Diagnosis not present

## 2024-01-02 DIAGNOSIS — D170 Benign lipomatous neoplasm of skin and subcutaneous tissue of head, face and neck: Secondary | ICD-10-CM | POA: Insufficient documentation

## 2024-01-02 DIAGNOSIS — M4726 Other spondylosis with radiculopathy, lumbar region: Secondary | ICD-10-CM | POA: Insufficient documentation

## 2024-01-02 DIAGNOSIS — M5030 Other cervical disc degeneration, unspecified cervical region: Secondary | ICD-10-CM | POA: Diagnosis not present

## 2024-01-02 DIAGNOSIS — I6522 Occlusion and stenosis of left carotid artery: Secondary | ICD-10-CM | POA: Diagnosis not present

## 2024-01-02 MED ORDER — IOHEXOL 300 MG/ML  SOLN
75.0000 mL | Freq: Once | INTRAMUSCULAR | Status: AC | PRN
  Administered 2024-01-02: 75 mL via INTRAVENOUS

## 2024-01-02 NOTE — Patient Instructions (Signed)
 Patient Plan  Lumbar Spondylosis with Radiculopathy  - Take meloxicam  15 mg daily for 1-2 weeks with food, then as needed. If drowsiness occurs, take in the evening. - Use methocarbamol  as needed for muscle spasms, up to three times daily, mainly at night. - Continue Pilates for flexibility and strength. - Consider starting formal physical therapy if symptoms do not improve. - Follow up in 8 weeks to reassess symptoms; an updated MRI may be considered if no improvement.  Red flags - seek care if you notice:  - New or worsening numbness, tingling, or weakness in the legs - Loss of bladder or bowel control - Severe or sudden back pain - New or worsening symptoms that concern you

## 2024-01-02 NOTE — Assessment & Plan Note (Signed)
 History of Present Illness Brittany Ochoa is a 58 year old female who presents with chronic low back pain exacerbated since June 2025.  Chronic low back pain - Chronic low back pain exacerbated since June 2025, coinciding with a trip to [place]  - Pain primarily localized to the spine and area around the kidneys - Occasional radiation to the back of the thigh, stopping at the knee, especially during Pilates exercises involving inversion or bridging - Pain described as a 'pulling' or 'tight' sensation, similar to an elastic band - No pain radiating below the knee - No tingling in the toes  Functional impact and activity modification - Practices Pilates three to five times per week since March 2025 - Does not attribute pain to Pilates - History of bodybuilding and weight training Prior imaging and structural findings - MRI from 2022 showed mild disc bulges at L1-L2, L3-L4, and L4-L5 - Facet hypertrophy noted - Mild to moderate foraminal stenosis on the left at L3-L4 and on the right at L4-L5  Medication use and adverse effects - Current use of Tylenol , which causes drowsiness - Previous use of meloxicam   Physical Exam SPECIAL TESTS: Negative straight leg raise bilaterally. Positive FADIR test on the left. FABER localizes to the anterolateral left hip. Negative iliopsoas and piriformis testing bilaterally. Negative FABER test on the right. Negative Kemp's test bilaterally.  Results RADIOLOGY Lumbar spine MRI: Mild disc bulge at L1-L2, no narrowing of neural foramina. L2-L3 normal. L3-L4 disc height loss, mild diffuse disc bulge, endplate osteophytic ridging, bilateral minimal facet hypertrophy, mild to moderate left foraminal stenosis. L4-L5 diffuse disc bulge, annular fissure, moderate facet hypertrophy, mild right foraminal stenosis. L5-S1 mild disc bulge, moderate facet hypertrophy, mild to moderate right foraminal stenosis. (2022)  Assessment and Plan Lumbar spondylosis with  radiculopathy Chronic lumbar spondylosis with radiculopathy, primarily joint-related. MRI shows mild disc bulges, facet hypertrophy, and foraminal stenosis, most at L3-L4. No surgical intervention needed. - Prescribed meloxicam  15 mg daily for 1-2 weeks, then as needed, with food. Monitor for drowsiness, adjust to PM if necessary. - Prescribed methocarbamol  as needed, up to three times daily, primarily at night. - Encouraged continuation of Pilates for flexibility and strength. - Consider formal physical therapy if no improvement. - Follow-up in 8 weeks to reassess, consider updated MRI if no improvement.

## 2024-01-02 NOTE — Progress Notes (Signed)
 Primary Care / Sports Medicine Office Visit  Patient Information:  Patient ID: ZOUA CAPORASO, female DOB: Jun 29, 1965 Age: 58 y.o. MRN: [id number]   Mirella Gueye Wolaver is a pleasant 58 y.o. female presenting with the following:  Chief Complaint  Patient presents with   Back Pain    Low back pain off and on since June. Patient having difficulty going from sit to stand and getting out of bed after dancing. Patient not taking any meds for pain. NKI.     Vitals:   12/27/23 0946  BP: 118/84  Pulse: 96  SpO2: 98%   Vitals:   12/27/23 0946  Weight: 179 lb 3.2 oz (81.3 kg)  Height: 5' 4 (1.626 m)   Body mass index is 30.76 kg/m.     Discussed the use of AI scribe software for clinical note transcription with the patient, who gave verbal consent to proceed.   Independent interpretation of notes and tests performed by another provider:   See below  Procedures performed:   None  Pertinent History, Exam, Impression, and Recommendations:   Problem List Items Addressed This Visit     Osteoarthritis of spine with radiculopathy, lumbar region - Primary   History of Present Illness MAIDA WIDGER is a 58 year old female who presents with chronic low back pain exacerbated since June 2025.  Chronic low back pain - Chronic low back pain exacerbated since June 2025, coinciding with a trip to [place]  - Pain primarily localized to the spine and area around the kidneys - Occasional radiation to the back of the thigh, stopping at the knee, especially during Pilates exercises involving inversion or bridging - Pain described as a 'pulling' or 'tight' sensation, similar to an elastic band - No pain radiating below the knee - No tingling in the toes  Functional impact and activity modification - Practices Pilates three to five times per week since March 2025 - Does not attribute pain to Pilates - History of bodybuilding and weight training Prior imaging and structural findings -  MRI from 2022 showed mild disc bulges at L1-L2, L3-L4, and L4-L5 - Facet hypertrophy noted - Mild to moderate foraminal stenosis on the left at L3-L4 and on the right at L4-L5  Medication use and adverse effects - Current use of Tylenol , which causes drowsiness - Previous use of meloxicam   Physical Exam SPECIAL TESTS: Negative straight leg raise bilaterally. Positive FADIR test on the left. FABER localizes to the anterolateral left hip. Negative iliopsoas and piriformis testing bilaterally. Negative FABER test on the right. Negative Kemp's test bilaterally.  Results RADIOLOGY Lumbar spine MRI: Mild disc bulge at L1-L2, no narrowing of neural foramina. L2-L3 normal. L3-L4 disc height loss, mild diffuse disc bulge, endplate osteophytic ridging, bilateral minimal facet hypertrophy, mild to moderate left foraminal stenosis. L4-L5 diffuse disc bulge, annular fissure, moderate facet hypertrophy, mild right foraminal stenosis. L5-S1 mild disc bulge, moderate facet hypertrophy, mild to moderate right foraminal stenosis. (2022)  Assessment and Plan Lumbar spondylosis with radiculopathy Chronic lumbar spondylosis with radiculopathy, primarily joint-related. MRI shows mild disc bulges, facet hypertrophy, and foraminal stenosis, most at L3-L4. No surgical intervention needed. - Prescribed meloxicam  15 mg daily for 1-2 weeks, then as needed, with food. Monitor for drowsiness, adjust to PM if necessary. - Prescribed methocarbamol  as needed, up to three times daily, primarily at night. - Encouraged continuation of Pilates for flexibility and strength. - Consider formal physical therapy if no improvement. - Follow-up in 8 weeks  to reassess, consider updated MRI if no improvement.      Relevant Medications   meloxicam  (MOBIC ) 15 MG tablet   methocarbamol  (ROBAXIN ) 500 MG tablet     Orders & Medications Medications:  Meds ordered this encounter  Medications   meloxicam  (MOBIC ) 15 MG tablet    Sig:  Take 1 tablet (15 mg total) by mouth daily. X1-2 weeks then daily prn    Dispense:  30 tablet    Refill:  1   methocarbamol  (ROBAXIN ) 500 MG tablet    Sig: Take 1 tablet (500 mg total) by mouth every 8 (eight) hours as needed for muscle spasms.    Dispense:  30 tablet    Refill:  1   No orders of the defined types were placed in this encounter.    Return in about 8 weeks (around 02/21/2024).     Selinda JINNY Ku, MD, [facility]   Primary Care Sports Medicine Primary Care and Sports Medicine at [facility]

## 2024-01-07 ENCOUNTER — Emergency Department
Admission: EM | Admit: 2024-01-07 | Discharge: 2024-01-07 | Disposition: A | Discharge status: Home / Self Care | Attending: Emergency Medicine | Admitting: Emergency Medicine

## 2024-01-07 ENCOUNTER — Other Ambulatory Visit: Payer: Self-pay

## 2024-01-07 ENCOUNTER — Telehealth: Admitting: Family Medicine

## 2024-01-07 ENCOUNTER — Emergency Department

## 2024-01-07 DIAGNOSIS — R3989 Other symptoms and signs involving the genitourinary system: Secondary | ICD-10-CM | POA: Diagnosis not present

## 2024-01-07 DIAGNOSIS — N12 Tubulo-interstitial nephritis, not specified as acute or chronic: Secondary | ICD-10-CM | POA: Diagnosis not present

## 2024-01-07 DIAGNOSIS — D72829 Elevated white blood cell count, unspecified: Secondary | ICD-10-CM | POA: Insufficient documentation

## 2024-01-07 DIAGNOSIS — R109 Unspecified abdominal pain: Secondary | ICD-10-CM | POA: Diagnosis not present

## 2024-01-07 LAB — URINALYSIS, ROUTINE W REFLEX MICROSCOPIC
Bilirubin Urine: NEGATIVE
Glucose, UA: NEGATIVE mg/dL
Ketones, ur: NEGATIVE mg/dL
Nitrite: POSITIVE — AB
Protein, ur: NEGATIVE mg/dL
Specific Gravity, Urine: 1.002 — ABNORMAL LOW (ref 1.005–1.030)
WBC, UA: >50 WBC/hpf (ref 0–5)
pH: 6 (ref 5.0–8.0)

## 2024-01-07 LAB — COMPREHENSIVE METABOLIC PANEL WITH GFR
ALT: 21 U/L (ref 0–44)
AST: 28 U/L (ref 15–41)
Albumin: 4 g/dL (ref 3.5–5.0)
Alkaline Phosphatase: 69 U/L (ref 38–126)
Anion gap: 11 (ref 5–15)
BUN: 12 mg/dL (ref 6–20)
CO2: 25 mmol/L (ref 22–32)
Calcium: 8.8 mg/dL — ABNORMAL LOW (ref 8.9–10.3)
Chloride: 101 mmol/L (ref 98–111)
Creatinine, Ser: 0.7 mg/dL (ref 0.44–1.00)
GFR, Estimated: >60 mL/min (ref >60)
Glucose, Bld: 96 mg/dL (ref 70–99)
Potassium: 3.5 mmol/L (ref 3.5–5.1)
Sodium: 137 mmol/L (ref 135–145)
Total Bilirubin: 0.9 mg/dL (ref 0.0–1.2)
Total Protein: 6.8 g/dL (ref 6.5–8.1)

## 2024-01-07 LAB — CBC
HCT: 41.3 % (ref 36.0–46.0)
Hemoglobin: 13.5 g/dL (ref 12.0–15.0)
MCH: 29.9 pg (ref 26.0–34.0)
MCHC: 32.7 g/dL (ref 30.0–36.0)
MCV: 91.6 fL (ref 80.0–100.0)
Platelets: 295 K/uL (ref 150–400)
RBC: 4.51 MIL/uL (ref 3.87–5.11)
RDW: 12.9 % (ref 11.5–15.5)
WBC: 11.3 K/uL — ABNORMAL HIGH (ref 4.0–10.5)
nRBC: 0 % (ref 0.0–0.2)

## 2024-01-07 MED ORDER — SODIUM CHLORIDE 0.9 % IV SOLN
1.0000 g | Freq: Once | INTRAVENOUS | Status: AC
  Administered 2024-01-07: 1 g via INTRAVENOUS
  Filled 2024-01-07: qty 10

## 2024-01-07 MED ORDER — ONDANSETRON HCL 4 MG/2ML IJ SOLN
4.0000 mg | Freq: Once | INTRAMUSCULAR | Status: AC
  Administered 2024-01-07: 4 mg via INTRAVENOUS
  Filled 2024-01-07: qty 2

## 2024-01-07 MED ORDER — ONDANSETRON 4 MG PO TBDP
4.0000 mg | ORAL_TABLET | Freq: Three times a day (TID) | ORAL | 0 refills | Status: DC | PRN
  Filled 2024-01-07: qty 30, 10d supply, fill #0

## 2024-01-07 MED ORDER — CEPHALEXIN 500 MG PO CAPS
500.0000 mg | ORAL_CAPSULE | Freq: Two times a day (BID) | ORAL | 0 refills | Status: DC
  Filled 2024-01-07: qty 14, 7d supply, fill #0

## 2024-01-07 MED ORDER — CEFADROXIL 500 MG PO CAPS
1000.0000 mg | ORAL_CAPSULE | Freq: Two times a day (BID) | ORAL | 0 refills | Status: DC
  Filled 2024-01-07: qty 28, 7d supply, fill #0

## 2024-01-07 MED ORDER — KETOROLAC TROMETHAMINE 15 MG/ML IJ SOLN
15.0000 mg | Freq: Once | INTRAMUSCULAR | Status: AC
  Administered 2024-01-07: 15 mg via INTRAVENOUS
  Filled 2024-01-07: qty 1

## 2024-01-07 MED ORDER — SODIUM CHLORIDE 0.9 % IV BOLUS
1000.0000 mL | Freq: Once | INTRAVENOUS | Status: AC
  Administered 2024-01-07: 1000 mL via INTRAVENOUS

## 2024-01-07 NOTE — Progress Notes (Signed)

## 2024-01-07 NOTE — Discharge Instructions (Signed)
 You were seen in the emergency department for abdominal pain and symptoms of a urinary tract infection.  You did have findings concerning for pyelonephritis which is an infection of your kidney.  Your urine was sent for culture.  You did not have any findings of kidney stones on your CT scan today.  You were given 1 dose of IV antibiotics in the emergency department.  You are given a prescription for a new antibiotic.  Discontinue your Keflex .  You can alternate Motrin  and Tylenol  for pain control.  Stay hydrated and drink plenty of fluids.  Follow-up closely with your primary care physician and return to the emergency department if you are unable to keep down your antibiotics or if you have any worsening symptoms  Pain control:  Ibuprofen  (motrin /aleve/advil ) - You can take 3 tablets (600 mg) every 6 hours as needed for pain/fever.  Acetaminophen  (tylenol ) - You can take 2 extra strength tablets (1000 mg) every 6 hours as needed for pain/fever.  You can alternate these medications or take them together.  Make sure you eat food/drink water when taking these medications.

## 2024-01-07 NOTE — ED Provider Notes (Signed)
 [facility] Provider Note    Event Date/Time   First MD Initiated Contact with Patient 01/07/24 1053     (approximate)   History   No chief complaint on file.   HPI  Brittany Ochoa is a 58 y.o. female past medical history significant for history of urinary tract infection who presents to the emergency department with left-sided abdominal pain.  Patient states that she has been having symptoms concerning for urinary tract infection.  Has been taking Azo and was just started on Keflex .  States that she was at work and had a sudden onset of severe pain to the left side.  Denies history of kidney stones.  Endorses nausea but no episodes of vomiting.  No fever.  No history of a resistant urinary tract infection.  Followed by urology with Dr. Penne for recurrent urinary tract infections.  Denies any falls or trauma.     Physical Exam   Triage Vital Signs: ED Triage Vitals  Encounter Vitals Group     BP 01/07/24 1038 (!) 168/104     Girls Systolic BP Percentile --      Girls Diastolic BP Percentile --      Boys Systolic BP Percentile --      Boys Diastolic BP Percentile --      Pulse Rate 01/07/24 1038 71     Resp 01/07/24 1038 18     Temp 01/07/24 1038 98.1 F (36.7 C)     Temp Source 01/07/24 1038 Oral     SpO2 01/07/24 1038 97 %     Weight --      Height --      Head Circumference --      Peak Flow --      Pain Score 01/07/24 1039 10     Pain Loc --      Pain Education --      Exclude from Growth Chart --     Most recent vital signs: Vitals:   01/07/24 1038  BP: (!) 168/104  Pulse: 71  Resp: 18  Temp: 98.1 F (36.7 C)  SpO2: 97%    Physical Exam Constitutional:      General: She is in acute distress.     Appearance: She is well-developed.  HENT:     Head: Atraumatic.  Eyes:     Conjunctiva/sclera: Conjunctivae normal.  Cardiovascular:     Rate and Rhythm: Regular rhythm.  Pulmonary:     Effort: No respiratory distress.   Abdominal:     General: There is no distension.     Tenderness: There is left CVA tenderness. There is no right CVA tenderness.  Musculoskeletal:        General: Normal range of motion.     Cervical back: Normal range of motion.  Skin:    General: Skin is warm.     Capillary Refill: Capillary refill takes less than 2 seconds.  Neurological:     Mental Status: She is alert. Mental status is at baseline.     IMPRESSION / MDM / ASSESSMENT AND PLAN / ED COURSE  I reviewed the triage vital signs and the nursing notes.  Differential diagnoses including pyelonephritis, kidney stone, infected kidney stone, musculoskeletal pain, intra-abdominal abscess, perforation.  No tearing pain, have a low suspicion for dissection and intact and symmetric pulses    No tachycardic or bradycardic dysrhythmias while on cardiac telemetry.  RADIOLOGY I independently reviewed imaging, my interpretation of imaging: CT scan with no  obvious findings of hydronephrosis.  Read as no acute findings.  LABS (all labs ordered are listed, but only abnormal results are displayed) Labs interpreted as -    Labs Reviewed  COMPREHENSIVE METABOLIC PANEL WITH GFR - Abnormal; Notable for the following components:      Result Value   Calcium 8.8 (*)    All other components within normal limits  CBC - Abnormal; Notable for the following components:   WBC 11.3 (*)    All other components within normal limits  URINALYSIS, ROUTINE W REFLEX MICROSCOPIC - Abnormal; Notable for the following components:   Color, Urine AMBER (*)    APPearance CLEAR (*)    Specific Gravity, Urine 1.002 (*)    Hgb urine dipstick MODERATE (*)    Nitrite POSITIVE (*)    Leukocytes,Ua TRACE (*)    Bacteria, UA RARE (*)    All other components within normal limits  URINE CULTURE  LIPASE, BLOOD     MDM    Patient was given IV ketorolac , IV fluids and IV antiemetics  On reevaluation significant improvement of her pain and states she is  feeling much better.  UA consistent with a urinary tract infection.  On chart review no history of a resistant urinary tract infection.  Urine was sent for culture.  Mild leukocytosis.  Creatinine at baseline with no significant electrolyte abnormality.  CT scan abdomen and pelvis with no findings of a kidney stone and no obvious intra-abdominal pathology.  No findings of intra-abdominal abscess.  Reevaluation significant improvement of symptoms.  Concern for possible pyelonephritis given her left CVA and pain.  Given a dose of IV Rocephin .  Will switch from Keflex  and after discussion with the pharmacist we will switch to cefadroxil .    PROCEDURES:  Critical Care performed: No  Procedures  Patient's presentation is most consistent with acute presentation with potential threat to life or bodily function.   MEDICATIONS ORDERED IN ED: Medications  ketorolac  (TORADOL ) 15 MG/ML injection 15 mg (15 mg Intravenous Given 01/07/24 1109)  ondansetron  (ZOFRAN ) injection 4 mg (4 mg Intravenous Given 01/07/24 1110)  sodium chloride  0.9 % bolus 1,000 mL (0 mLs Intravenous Stopped 01/07/24 1258)  cefTRIAXone  (ROCEPHIN ) 1 g in sodium chloride  0.9 % 100 mL IVPB (0 g Intravenous Stopped 01/07/24 1258)    FINAL CLINICAL IMPRESSION(S) / ED DIAGNOSES   Final diagnoses:  Pyelonephritis  Flank pain     Rx / DC Orders   ED Discharge Orders          Ordered    ondansetron  (ZOFRAN -ODT) 4 MG disintegrating tablet  Every 8 hours PRN        01/07/24 1244    cefadroxil  (DURICEF) 500 MG capsule  2 times daily        01/07/24 1244             Note:  This document was prepared using Dragon voice recognition software and may include unintentional dictation errors.   Suzanne Kirsch, MD 01/07/24 [phone]

## 2024-01-07 NOTE — ED Triage Notes (Addendum)
 Pt to ED via POV from home. Pt reports recurrent UTI's. Pt reports started to feel a UTI coming on. Pt reports at 0930 started to left sided kidney pain and now is nauseas and increased urge to urinate.   Pt rolled up left sleeve and has rash noted to left AC. Hot to touch. Pt has not noticed rash prior to triage.

## 2024-01-07 NOTE — Progress Notes (Signed)
 I have spent 5 minutes in review of e-visit questionnaire, review and updating patient chart, medical decision making and response to patient.   Elsie Velma Lunger, PA-C

## 2024-01-08 ENCOUNTER — Ambulatory Visit: Payer: Self-pay

## 2024-01-08 ENCOUNTER — Other Ambulatory Visit: Payer: Self-pay

## 2024-01-08 DIAGNOSIS — D170 Benign lipomatous neoplasm of skin and subcutaneous tissue of head, face and neck: Secondary | ICD-10-CM

## 2024-01-08 DIAGNOSIS — R195 Other fecal abnormalities: Secondary | ICD-10-CM | POA: Diagnosis not present

## 2024-01-08 LAB — URINE CULTURE: Culture: NO GROWTH

## 2024-01-10 ENCOUNTER — Encounter: Payer: Self-pay | Admitting: Psychology

## 2024-01-10 ENCOUNTER — Ambulatory Visit: Payer: Self-pay

## 2024-01-10 NOTE — Telephone Encounter (Signed)
 I can double book her at 1 today

## 2024-01-10 NOTE — Telephone Encounter (Signed)
 FYI Only or Action Required?: Action required by provider: clinical question for provider.  Patient was last seen in primary care on 12/27/2023 by Alvia Selinda PARAS, MD.  Called Nurse Triage reporting Flank Pain.  Symptoms began several days ago.  Interventions attempted: Prescription medications: antibiotic and tylenol .  Symptoms are: unchanged.  Triage Disposition: See PCP When Office is Open (Within 3 Days)  Patient/caregiver understands and will follow disposition?: No, wishes to speak with PCP  Copied from CRM #[id number]. Topic: Clinical - Red Word Triage >> Jan 10, 2024 11:20 AM Antony RAMAN wrote: Red Word that prompted transfer to Nurse Triage: throbbing pain in left side kidney area Reason for Disposition  [1] MILD pain (i.e., scale 1-3; does not interfere with normal activities) AND [2] present > 3 days  Answer Assessment - Initial Assessment Questions Patient requests to know if it is normal to still have pain with taking Advil  and antibiotics.Patient reports still having pain, currently taking antibiotics.   ED visit 01/07/24  Advised patient to continue taking antibiotic/completely, drink plenty of fluids. Advised UC/ED if symptoms worsen.  1. LOCATION: Where does it hurt? (e.g., left, right)     Lower left back, 'its where my kidney is at 2. ONSET: When did the pain start?     01/07/24 3. SEVERITY: How bad is the pain? (e.g., Scale 1-10; mild, moderate, or severe)     1/10; it's just still there, it's dull, I've been still taking Advil , as soon as I feel pain, I take it right way.  4. PATTERN: Does the pain come and go, or is it constant?      constant 5. CAUSE: What do you think is causing the pain?     Dx pylenoprhitis 6. OTHER SYMPTOMS:  Do you have any other symptoms? (e.g., fever, abdomen pain, vomiting, leg weakness, burning with urination, blood in urine)     Denies fever, chills, n/v, abd pain, pain urination, no blood.  Protocols used: Flank  Pain-A-AH

## 2024-01-14 ENCOUNTER — Encounter: Payer: Self-pay | Admitting: Surgery

## 2024-01-14 ENCOUNTER — Ambulatory Visit (INDEPENDENT_AMBULATORY_CARE_PROVIDER_SITE_OTHER): Admitting: Surgery

## 2024-01-14 VITALS — BP 112/79 | HR 75 | Ht 64.0 in | Wt 178.0 lb

## 2024-01-14 DIAGNOSIS — D170 Benign lipomatous neoplasm of skin and subcutaneous tissue of head, face and neck: Secondary | ICD-10-CM

## 2024-01-14 DIAGNOSIS — D1722 Benign lipomatous neoplasm of skin and subcutaneous tissue of left arm: Secondary | ICD-10-CM | POA: Diagnosis not present

## 2024-01-14 LAB — OVA AND PARASITE EXAMINATION

## 2024-01-14 NOTE — Patient Instructions (Signed)
 We have spoken today about removing a Lipoma. This will be done by Dr. Jordis at [facility].  You will most likely be able to leave the hospital several hours after your surgery. You may need to have a drain placed after surgery, this depends on the size of your lipoma.  Plan to tentatively be off work for up to 1-2 weeks following the surgery.  Please see your Blue surgery sheet for more information. You will receive several phone calls regarding your surgery.  If you have FMLA or Disability paperwork that needs to be filled out, please have your company fax your paperwork to [phone] or you may drop this by either office. This paperwork will be filled out within 3 days after your surgery has been completed.  Lipoma Removal  Lipoma removal is a surgical procedure to remove a lipoma, which is a noncancerous (benign) tumor that is made up of fat cells. Most lipomas are small and painless and do not require treatment. They can form in many areas of the body but are most common under the skin of the back, arms, shoulders, buttocks, and thighs. You may need lipoma removal if you have a lipoma that is large, growing, or causing discomfort. Lipoma removal may also be done for cosmetic reasons. Tell a health care provider about: Any allergies you have. All medicines you are taking, including vitamins, herbs, eye drops, creams, and over-the-counter medicines. Any problems you or family members have had with anesthetic medicines. Any bleeding problems you have. Any surgeries you have had. Any medical conditions you have. Whether you are pregnant or may be pregnant. What are the risks? Generally, this is a safe procedure. However, problems may occur, including: Infection. Bleeding. Scarring. Allergic reactions to medicines. Damage to nearby structures or organs, such as damage to nerves or blood vessels near the lipoma. What happens before the procedure? Medicines Ask your health care  provider about: Changing or stopping your regular medicines. This is especially important if you are taking diabetes medicines or blood thinners. Taking medicines such as aspirin and ibuprofen . These medicines can thin your blood. Do not take these medicines unless your health care provider tells you to take them. Taking over-the-counter medicines, vitamins, herbs, and supplements. General instructions You will have a physical exam. Your health care provider will check the size of the lipoma and whether it can be removed easily. You may have a biopsy and imaging tests, such as X-rays, a CT scan, and an MRI. Do not use any products that contain nicotine or tobacco for at least 4 weeks before the procedure. These products include cigarettes, chewing tobacco, and vaping devices, such as e-cigarettes. If you need help quitting, ask your health care provider. Ask your health care provider: How your surgery site will be marked. What steps will be taken to help prevent infection. These may include: Washing skin with a germ-killing soap. Taking antibiotic medicine. If you will be going home right after the procedure, plan to have a responsible adult: Take you home from the hospital or clinic. You will not be allowed to drive. Care for you for the time you are told. What happens during the procedure?  An IV will be inserted into one of your veins. You will be given one or more of the following: A medicine to help you relax (sedative). A medicine to numb the area (local anesthetic). A medicine to make you fall asleep (general anesthetic). A medicine that is injected into an area of  your body to numb everything below the injection site (regional anesthetic). An incision will be made into the skin over the lipoma or very near the lipoma. The incision may be made in a natural skin line or crease. Tissues, nerves, and blood vessels near the lipoma will be moved out of the way. The lipoma and the capsule  that surrounds it will be separated from the surrounding tissues. The lipoma will be removed. You may have a drain placed depending on the size of your lipoma The incision may be closed with stitches (sutures). A bandage (dressing) will be placed over the incision. The procedure may vary among health care providers and hospitals. What happens after the procedure? Your blood pressure, heart rate, breathing rate, and blood oxygen  level will be monitored until you leave the hospital or clinic. If you were prescribed an antibiotic medicine, use it as told by your health care provider. Do not stop using the antibiotic even if you start to feel better. If you were given a sedative during the procedure, it can affect you for several hours. Do not drive or operate machinery until your health care provider says that it is safe. Where to find more information OrthoInfo: orthoinfo.aaos.org Summary Before the procedure, follow instructions from your health care provider about eating and drinking, and changing or stopping your regular medicines. This is especially important if you are taking diabetes medicines or blood thinners. After the lipoma is removed, the incision may be closed with stitches (sutures) and covered with a bandage (dressing). If you were given a sedative during the procedure, it can affect you for several hours. Do not drive or operate machinery until your health care provider says that it is safe.

## 2024-01-15 ENCOUNTER — Encounter: Payer: Self-pay | Admitting: Surgery

## 2024-01-15 NOTE — Progress Notes (Signed)
 Outpatient Surgical Follow Up    Brittany Ochoa is an 58 y.o. female.   Chief Complaint  Patient presents with   New Patient (Initial Visit)    HPI: Brittany Ochoa is a 58 y.o. female seen for an neck mass.  Dr. Desiderio previously saw her and felt that neck lipoma was deep for his comfort level. The patient reports that she has had this for many years since at least 2008.  However over time this has been growing in size.  She had a CT scan of the neck in 2022 which showed a 4.9 x 4 x 5 cm mass in the left supraclavicular neck likely to be a lipoma.  She endorses that she had the lipoma partially removed by her plastic surgeon close to 18 years ago..  The patient reports that over the years, this mass in the left neck has continued to grow.  It has resulted in sometimes feeling of tingliness in the left neck area and shoulder area.   Separately, the patient also reports having a lipoma in the left forearm .  The forearm lipoma is also symptomatic but she does not have any issues with the back lipoma.   He did have a recent CT of the neck that I have personally reviewed showing enlarging left neck lipoma measures 7.2 cm with benign features. Past Medical History:  Diagnosis Date   Allergy 2015   seasonal   Arthritis    Ascending aortic aneurysm    a. 06/2018 CTA Chest: 4.1 x 4.0 cm fusiform Asc Ao aneurysm-unchanged.  Descending thoracic aorta 2.1 cm.   Asthma    exercised induced   Autonomic dysfunction with vaccines    Dyspnea on exertion 2019   a. 06/2017 echo: EF 60 to 65%.  No regional wall motion abnormalities.  Ascending aortic diameter 3.9 cm.  Mild MR.   Endometrial polyp    Gastric erythema    History of hypertension no medications since 2020    Lipoma    of neck and chest   Multiple thyroid  nodules    OCD (obsessive compulsive disorder)    Pre-diabetes    Reflux esophagitis     Past Surgical History:  Procedure Laterality Date   colonscopy  12/16/2021   endoscopy  done also   COSMETIC SURGERY  1997   breast reduction   DILATATION & CURETTAGE/HYSTEROSCOPY WITH MYOSURE N/A 10/18/2022   Procedure: DILATATION & CURETTAGE/HYSTEROSCOPY WITH POLYPECTOMY BY MYOSURE;  Surgeon: Kandyce Sor, MD;  Location: [facility];  Service: Gynecology;  Laterality: N/A;   ESOPHAGOGASTRODUODENOSCOPY (EGD) WITH PROPOFOL  N/A 01/24/2018   Procedure: ESOPHAGOGASTRODUODENOSCOPY (EGD) WITH PROPOFOL ;  Surgeon: Janalyn Keene NOVAK, MD;  Location: [facility] END[facility];  Service: Endoscopy;  Laterality: N/A;   ESOPHAGOGASTRODUODENOSCOPY (EGD) WITH PROPOFOL  N/A 09/09/2020   Procedure: ESOPHAGOGASTRODUODENOSCOPY (EGD) WITH PROPOFOL ;  Surgeon: Janalyn Keene NOVAK, MD;  Location: [facility];  Service: Endoscopy;  Laterality: N/A;   ESOPHAGOGASTRODUODENOSCOPY (EGD) WITH PROPOFOL  N/A 02/17/2021   Procedure: ESOPHAGOGASTRODUODENOSCOPY (EGD) WITH PROPOFOL ;  Surgeon: Janalyn Keene NOVAK, MD;  Location: [facility] END[facility];  Service: Endoscopy;  Laterality: N/A;   lipoma removal  2007   back   REDUCTION MAMMAPLASTY Bilateral 12/01/1996    Family History  Problem Relation Age of Onset   Diabetes Mother    Arthritis Mother    Obesity Mother    Miscarriages / [place] Mother    Diabetes Father    Heart disease Father    Heart attack Father  Stomach cancer Father 60   Cancer Father    Asthma Brother    Anxiety disorder Brother    ADD / ADHD Brother    Osteoporosis Maternal Grandmother    Arthritis Maternal Grandmother    Varicose Veins Maternal Grandmother    Heart disease Paternal Grandmother    Stomach cancer Paternal Grandmother    Breast cancer Paternal Grandmother    Cancer Paternal Grandmother    Cancer Paternal Grandfather        spleen   Heart disease Paternal Grandfather    Alcohol abuse Maternal Uncle    Cancer Paternal Aunt        rare blood cancer; non genetic   Diabetes Paternal Aunt    Cancer Paternal Uncle        rare blood cancer; non genetic    Cancer Paternal Aunt    Alcohol abuse Maternal Uncle     Social History:  reports that she quit smoking about 19 years ago. Her smoking use included cigarettes. She started smoking about 44 years ago. She has a 50 pack-year smoking history. She has been exposed to tobacco smoke. She has never used smokeless tobacco. She reports that she does not currently use alcohol. She reports that she does not use drugs.  Allergies:  Allergies  Allergen Reactions   Codeine Anaphylaxis   Pholcodine     Other reaction(s): wheezing cannot ake due to aneurysm   Dilaudid  [Hydromorphone ] Other (See Comments)   Medical Provider Ez Flu Shot [Influenza Vac Split Quad] Other (See Comments)    Severe myalgias and bone pain Any vaccine sets off dysnomia and wind up in hospital   Quinolones     Aortic enlargement    Tirzepatide      Other Reaction(s): Hypertension/Headache sets off dysnomina    Medications reviewed.    ROS Full ROS performed and is otherwise negative other than what is stated in HPI   BP 112/79   Pulse 75   Ht 5' 4 (1.626 m)   Wt 178 lb (80.7 kg)   LMP 06/01/2019   SpO2 98%   BMI 30.55 kg/m   Physical Exam NAD , chaperone present CONSTITUTIONAL: NAD, well-nourished HEENT:  Normocephalic, atraumatic, extraocular motion intact. RESPIRATORY:  Normal respiratory effort without pathologic use of accessory muscles. CARDIOVASCULAR: Regular rhythm and rate. No murmurs Neck: 7-8cm mass in the left supraclavicular area, soft and mobile consistent with this lipoma, prior scar also noted/.  It is soft and nontender.   Trachea is midline, no palpable LAD MSK: 4-5 cm mass in the left forearm which is soft, mobile, and nontender, consistent with a lipoma.  She does have some noticeable although mild edema of the left upper extremity compared to the right upper extremity.   ABd: soft, nt, no masses SKIN: Skin turgor is normal. There are no pathologic skin lesions.  NEUROLOGIC:  Motor and  sensation is grossly normal.  Cranial nerves are grossly intact. PSYCH:  Alert and oriented to person, place and time. Affect is normal.   Assessment/Plan: 58 year old female with symptomatic soft tissue neck mass consistent with lipoma as well as a symptomatic and enlarging left forearm lipoma.  She wishes excision.  Discussed with her in detail about her disease process.  We also discussed the need for excision under anesthesia in the operating room given its location.  I have personally reviewed the CT scan and I think that is she will to have this excised without any major vascular involvement. I have experience  with neck surgery and neck soft tissue masses and feel comfortable performing the excision We Did talk about the procedure the risk the benefits and the possible complications including but not limited to: Bleeding, infection, nerve injury, pain.  She understands and wishes to proceed I personally spent a total of 40 minutes in the care of the patient today including performing a medically appropriate exam/evaluation, counseling and educating, placing orders, referring and communicating with other health care professionals, documenting clinical information in the EHR, independently interpreting and reviewing images studies and coordinating care.   Laneta Luna, MD [facility] General Surgeon

## 2024-01-17 ENCOUNTER — Telehealth: Payer: Self-pay | Admitting: Surgery

## 2024-01-17 NOTE — Telephone Encounter (Signed)
 Patient has been advised of Pre-Admission date/time, and Surgery date at [facility].  Surgery Date: 02/28/24 Preadmission Testing Date: Preadmissions to call patient with appointment  Patient aware of the scheduling process and surgery information given at time of office visit.  Patient has been made aware to call [phone], between 1-3:00pm the day before surgery, to find out what time to arrive for surgery.

## 2024-01-22 ENCOUNTER — Encounter (INDEPENDENT_AMBULATORY_CARE_PROVIDER_SITE_OTHER): Admitting: Vascular Surgery

## 2024-01-22 ENCOUNTER — Ambulatory Visit: Admitting: Family Medicine

## 2024-01-22 ENCOUNTER — Encounter: Payer: Self-pay | Admitting: Family Medicine

## 2024-01-22 VITALS — BP 120/83 | HR 78 | Temp 98.1°F | Ht 64.0 in | Wt 177.4 lb

## 2024-01-22 DIAGNOSIS — R7989 Other specified abnormal findings of blood chemistry: Secondary | ICD-10-CM

## 2024-01-22 DIAGNOSIS — R410 Disorientation, unspecified: Secondary | ICD-10-CM | POA: Diagnosis not present

## 2024-01-22 DIAGNOSIS — Z87448 Personal history of other diseases of urinary system: Secondary | ICD-10-CM

## 2024-01-22 NOTE — Progress Notes (Signed)
 BP 120/83   Pulse 78   Temp 98.1 F (36.7 C) (Oral)   Ht 5' 4 (1.626 m)   Wt 177 lb 6 oz (80.5 kg)   LMP 06/01/2019   SpO2 98%   BMI 30.45 kg/m    Subjective:    Patient ID: Brittany Ochoa, female    DOB: Nov 17, 1965, 58 y.o.   MRN: [id number]  HPI: Brittany Ochoa is a 58 y.o. female  Chief Complaint  Patient presents with   Urinary Tract Infection   ER FOLLOW UP Time since discharge: 2 weeks  Hospital/facility: [facility] Diagnosis: Pyelonephritis Procedures/tests: CT abdomen Consultants: None New medications: cefadroxil , rocephin , zofran  Discharge instructions:  Follow up here Status: better  Since getting out of the ER, Brittany Ochoa has been feeling better. She has been really stressed out. Has a cat that has fleas and is dealing with that. Has a lot going on with her family. Her   She is scheduled for surgery with Dr. Jordis for her lipoma resection in about a month. Looking forward to it.   She would like to see neurology for confusional state. She is scheduled for neuropsych testing in December.   Relevant past medical, surgical, family and social history reviewed and updated as indicated. Interim medical history since our last visit reviewed. Allergies and medications reviewed and updated.  Review of Systems  Constitutional: Negative.   Respiratory: Negative.    Cardiovascular: Negative.   Musculoskeletal: Negative.   Skin: Negative.   Hematological: Negative.   Psychiatric/Behavioral: Negative.      Per HPI unless specifically indicated above     Objective:    BP 120/83   Pulse 78   Temp 98.1 F (36.7 C) (Oral)   Ht 5' 4 (1.626 m)   Wt 177 lb 6 oz (80.5 kg)   LMP 06/01/2019   SpO2 98%   BMI 30.45 kg/m   Wt Readings from Last 3 Encounters:  01/22/24 177 lb 6 oz (80.5 kg)  01/14/24 178 lb (80.7 kg)  12/28/23 177 lb 12.8 oz (80.6 kg)    Physical Exam Vitals and nursing note reviewed.  Constitutional:      General: She is not in acute distress.     Appearance: Normal appearance. She is not ill-appearing, toxic-appearing or diaphoretic.  HENT:     Head: Normocephalic and atraumatic.     Right Ear: External ear normal.     Left Ear: External ear normal.     Nose: Nose normal.     Mouth/Throat:     Mouth: Mucous membranes are moist.     Pharynx: Oropharynx is clear.  Eyes:     General: No scleral icterus.       Right eye: No discharge.        Left eye: No discharge.     Extraocular Movements: Extraocular movements intact.     Conjunctiva/sclera: Conjunctivae normal.     Pupils: Pupils are equal, round, and reactive to light.  Cardiovascular:     Rate and Rhythm: Normal rate and regular rhythm.     Pulses: Normal pulses.     Heart sounds: Normal heart sounds. No murmur heard.    No friction rub. No gallop.  Pulmonary:     Effort: Pulmonary effort is normal. No respiratory distress.     Breath sounds: Normal breath sounds. No stridor. No wheezing, rhonchi or rales.  Chest:     Chest wall: No tenderness.  Musculoskeletal:  General: Normal range of motion.     Cervical back: Normal range of motion and neck supple.  Skin:    General: Skin is warm and dry.     Capillary Refill: Capillary refill takes less than 2 seconds.     Coloration: Skin is not jaundiced or pale.     Findings: No bruising, erythema, lesion or rash.  Neurological:     General: No focal deficit present.     Mental Status: She is alert and oriented to person, place, and time. Mental status is at baseline.  Psychiatric:        Mood and Affect: Mood normal.        Behavior: Behavior normal.        Thought Content: Thought content normal.        Judgment: Judgment normal.     Results for orders placed or performed during the hospital encounter of 01/07/24  Comprehensive metabolic panel   Collection Time: 01/07/24 10:41 AM  Result Value Ref Range   Sodium 137 135 - 145 mmol/L   Potassium 3.5 3.5 - 5.1 mmol/L   Chloride 101 98 - 111 mmol/L   CO2 25  22 - 32 mmol/L   Glucose, Bld 96 70 - 99 mg/dL   BUN 12 6 - 20 mg/dL   Creatinine, Ser 9.29 0.44 - 1.00 mg/dL   Calcium 8.8 (L) 8.9 - 10.3 mg/dL   Total Protein 6.8 6.5 - 8.1 g/dL   Albumin 4.0 3.5 - 5.0 g/dL   AST 28 15 - 41 U/L   ALT 21 0 - 44 U/L   Alkaline Phosphatase 69 38 - 126 U/L   Total Bilirubin 0.9 0.0 - 1.2 mg/dL   GFR, Estimated >39 >39 mL/min   Anion gap 11 5 - 15  CBC   Collection Time: 01/07/24 10:41 AM  Result Value Ref Range   WBC 11.3 (H) 4.0 - 10.5 K/uL   RBC 4.51 3.87 - 5.11 MIL/uL   Hemoglobin 13.5 12.0 - 15.0 g/dL   HCT 58.6 63.9 - 53.9 %   MCV 91.6 80.0 - 100.0 fL   MCH 29.9 26.0 - 34.0 pg   MCHC 32.7 30.0 - 36.0 g/dL   RDW 87.0 88.4 - 84.4 %   Platelets 295 150 - 400 K/uL   nRBC 0.0 0.0 - 0.2 %  Urine Culture (for pregnant, neutropenic or urologic patients or patients with an indwelling urinary catheter)   Collection Time: 01/07/24 10:56 AM   Specimen: Urine, Clean Catch  Result Value Ref Range   Specimen Description      URINE, CLEAN CATCH Performed at [facility], [address] [place], [place] [address]    Special Requests      NONE Performed at [facility], [address], [place], [place] [address]    Culture      NO GROWTH Performed at [facility], [address] [place], [place] [address]    Report Status 01/08/2024 FINAL   Urinalysis, Routine w reflex microscopic -Urine, Clean Catch   Collection Time: 01/07/24 10:56 AM  Result Value Ref Range   Color, Urine AMBER (A) YELLOW   APPearance CLEAR (A) CLEAR   Specific Gravity, Urine 1.002 (L) 1.005 - 1.030   pH 6.0 5.0 - 8.0   Glucose, UA NEGATIVE NEGATIVE mg/dL   Hgb urine dipstick MODERATE (A) NEGATIVE   Bilirubin Urine NEGATIVE NEGATIVE   Ketones, ur NEGATIVE NEGATIVE mg/dL   Protein, ur NEGATIVE  NEGATIVE mg/dL   Nitrite POSITIVE (A) NEGATIVE   Leukocytes,Ua TRACE (A) NEGATIVE   RBC / HPF 0-5 0 - 5 RBC/hpf   WBC, UA >50 0 - 5 WBC/hpf   Bacteria,  UA RARE (A) NONE SEEN   Squamous Epithelial / HPF 0-5 0 - 5 /HPF   Mucus PRESENT       Assessment & Plan:   Problem List Items Addressed This Visit       Nervous and Auditory   Confusion with non-focal neuro exam   Seeing neuropsych in December. Will refer to neurology. Call with any concerns. Continue to monitor.       Relevant Orders   Ambulatory referral to Neurology   Other Visit Diagnoses       History of pyelonephritis    -  Primary   UA clear. Will send for culture to confirm. Call with any concerns. Continue to monitor.   Relevant Orders   Urinalysis, Routine w reflex microscopic   Urine Culture     Abnormal thyroid  blood test       Due for lab recheck today. Await results. Treat as needed.   Relevant Orders   TSH        Follow up plan: Return in about 2 months (around 04/07/2024).

## 2024-01-22 NOTE — Assessment & Plan Note (Signed)
 Seeing neuropsych in December. Will refer to neurology. Call with any concerns. Continue to monitor.

## 2024-01-23 ENCOUNTER — Ambulatory Visit: Payer: Self-pay | Admitting: Family Medicine

## 2024-01-23 ENCOUNTER — Telehealth: Payer: Self-pay | Admitting: *Deleted

## 2024-01-23 LAB — URINALYSIS, ROUTINE W REFLEX MICROSCOPIC
Bilirubin, UA: NEGATIVE
Glucose, UA: NEGATIVE
Ketones, UA: NEGATIVE
Leukocytes,UA: NEGATIVE
Nitrite, UA: NEGATIVE
Protein,UA: NEGATIVE
RBC, UA: NEGATIVE
Specific Gravity, UA: 1.01 (ref 1.005–1.030)
Urobilinogen, Ur: 0.2 mg/dL (ref 0.2–1.0)
pH, UA: 6.5 (ref 5.0–7.5)

## 2024-01-23 LAB — TSH: TSH: 0.472 u[IU]/mL (ref 0.450–4.500)

## 2024-01-23 NOTE — Telephone Encounter (Signed)
Faxed FMLA to [employer] at [phone] 

## 2024-01-25 ENCOUNTER — Ambulatory Visit: Admitting: Family Medicine

## 2024-01-26 ENCOUNTER — Other Ambulatory Visit: Payer: Self-pay

## 2024-01-26 ENCOUNTER — Emergency Department
Admission: EM | Admit: 2024-01-26 | Discharge: 2024-01-26 | Disposition: A | Discharge status: Home / Self Care | Attending: Emergency Medicine | Admitting: Emergency Medicine

## 2024-01-26 ENCOUNTER — Emergency Department

## 2024-01-26 DIAGNOSIS — I10 Essential (primary) hypertension: Secondary | ICD-10-CM | POA: Diagnosis not present

## 2024-01-26 DIAGNOSIS — R079 Chest pain, unspecified: Secondary | ICD-10-CM | POA: Diagnosis not present

## 2024-01-26 DIAGNOSIS — R0789 Other chest pain: Secondary | ICD-10-CM | POA: Insufficient documentation

## 2024-01-26 LAB — BASIC METABOLIC PANEL WITH GFR
Anion gap: 11 (ref 5–15)
BUN: 14 mg/dL (ref 6–20)
CO2: 25 mmol/L (ref 22–32)
Calcium: 8.9 mg/dL (ref 8.9–10.3)
Chloride: 104 mmol/L (ref 98–111)
Creatinine, Ser: 0.68 mg/dL (ref 0.44–1.00)
GFR, Estimated: >60 mL/min (ref >60)
Glucose, Bld: 102 mg/dL — ABNORMAL HIGH (ref 70–99)
Potassium: 3.5 mmol/L (ref 3.5–5.1)
Sodium: 140 mmol/L (ref 135–145)

## 2024-01-26 LAB — CBC
HCT: 40.2 % (ref 36.0–46.0)
Hemoglobin: 13.2 g/dL (ref 12.0–15.0)
MCH: 29.9 pg (ref 26.0–34.0)
MCHC: 32.8 g/dL (ref 30.0–36.0)
MCV: 91 fL (ref 80.0–100.0)
Platelets: 226 K/uL (ref 150–400)
RBC: 4.42 MIL/uL (ref 3.87–5.11)
RDW: 12.8 % (ref 11.5–15.5)
WBC: 5.9 K/uL (ref 4.0–10.5)
nRBC: 0 % (ref 0.0–0.2)

## 2024-01-26 LAB — TROPONIN I (HIGH SENSITIVITY): Troponin I (High Sensitivity): <2 ng/L (ref <18)

## 2024-01-26 NOTE — ED Triage Notes (Addendum)
 Pt arrives POV, ambulatory to triage, gait steady, no acute distress noted c/o sharp intermittent CP w/ left arm numbness/coldness that started around 0230. Pt denies sob, n/v or dizziness.  Pt reports palpitations earlier but has subsided. Pt states no chest pain now but endorses the left neck/arm numbness/ coldness.

## 2024-01-26 NOTE — ED Provider Notes (Signed)
 [facility] Provider Note    Event Date/Time   First MD Initiated Contact with Patient 01/26/24 [phone]     (approximate)   History   Chest Pain   HPI  Brittany Ochoa is a 58 y.o. female who presents to the ED for evaluation of Chest Pain   Reviewed PCP visit from 4 days ago.  Follow-up after ER visit on 9/22 where she was given antibiotics for UTI and possible pyelonephritis. Reviewed cardiology clinic visit from July.  History of HTN, palpitations, small stable ascending aortic aneurysm at 4.1 cm  Patient presents for evaluation of sharp left-sided chest pain that has since resolved.  She reports 2 brief episodes of sharp pain that lasted a couple seconds and self resolved.  Reports tingling to her left arm that is also resolved.  She reports feeling fine now and just wanted to make sure that she has not had a heart attack.  She reports being on a tremendous amount of stress at home and attributes her symptoms to anxiety.  Physical Exam   Triage Vital Signs: ED Triage Vitals  Encounter Vitals Group     BP 01/26/24 0332 (!) 148/98     Girls Systolic BP Percentile --      Girls Diastolic BP Percentile --      Boys Systolic BP Percentile --      Boys Diastolic BP Percentile --      Pulse Rate 01/26/24 0332 82     Resp 01/26/24 0332 18     Temp 01/26/24 0332 98.5 F (36.9 C)     Temp Source 01/26/24 0332 Oral     SpO2 01/26/24 0332 96 %     Weight --      Height --      Head Circumference --      Peak Flow --      Pain Score 01/26/24 0330 6     Pain Loc --      Pain Education --      Exclude from Growth Chart --     Most recent vital signs: Vitals:   01/26/24 0500 01/26/24 0530  BP: (!) 144/76 122/72  Pulse: 60 68  Resp: 15   Temp:    SpO2: 100% 98%    General: Awake, no distress.  Pleasant and conversational, well-appearing CV:  Good peripheral perfusion.  Symmetric pulses Resp:  Normal effort.  Abd:  No distention.  MSK:  No  deformity noted.  Neuro:  No focal deficits appreciated. Other:     ED Results / Procedures / Treatments   Labs (all labs ordered are listed, but only abnormal results are displayed) Labs Reviewed  BASIC METABOLIC PANEL WITH GFR - Abnormal; Notable for the following components:      Result Value   Glucose, Bld 102 (*)    All other components within normal limits  CBC  TROPONIN I (HIGH SENSITIVITY)  TROPONIN I (HIGH SENSITIVITY)    EKG Sinus rhythm with a rate of 80 bpm.  Normal axis and intervals without clear signs of acute ischemia.  RADIOLOGY CXR interpreted by me without evidence of acute cardiopulmonary pathology.  Official radiology report(s): DG Chest 2 View Result Date: 01/26/2024 CLINICAL DATA:  Chest pain EXAM: CHEST - 2 VIEW COMPARISON:  10/19/2019 FINDINGS: The heart size and mediastinal contours are within normal limits. Both lungs are clear. The visualized skeletal structures are unremarkable. IMPRESSION: No active cardiopulmonary disease. Electronically Signed   By: Oneil  Lukens M.D.   On: 01/26/2024 03:46    PROCEDURES and INTERVENTIONS:  .1-3 Lead EKG Interpretation  Performed by: Claudene Rover, MD Authorized by: Claudene Rover, MD     Interpretation: normal     ECG rate:  78   ECG rate assessment: normal     Rhythm: sinus rhythm     Ectopy: none     Conduction: normal     Medications - No data to display   IMPRESSION / MDM / ASSESSMENT AND PLAN / ED COURSE  I reviewed the triage vital signs and the nursing notes.  Differential diagnosis includes, but is not limited to, ACS, PTX, PNA, muscle strain/spasm, PE, dissection, anxiety, pleural effusion  {Patient presents with symptoms of an acute illness or injury that is potentially life-threatening.  Patient presents after a brief episode of sharp left-sided chest pain.  Now asymptomatic and looks well with a normal exam.  Nonischemic EKG and negative first troponin.  Normal CBC, metabolic panel and  CXR.  Maintained on the monitor without dysrhythmia.  We will obtain a second troponin.   Clinical Course as of 01/26/24 0550  Sat Jan 26, 2024  [id number] Delays in getting her 2nd troponin, the RN was busy with more acute patients. Patient now calling out requesting discharge. Acknowledges risk of undiagnosed pathology, has capacity, we discussed close return precautions [DS]    Clinical Course User Index [DS] Claudene Rover, MD     FINAL CLINICAL IMPRESSION(S) / ED DIAGNOSES   Final diagnoses:  Other chest pain     Rx / DC Orders   ED Discharge Orders     None        Note:  This document was prepared using Dragon voice recognition software and may include unintentional dictation errors.   Claudene Rover, MD 01/26/24 [phone]

## 2024-01-26 NOTE — ED Notes (Signed)
 Pt declined second troponin and asked to leave. Provider notified.

## 2024-01-28 ENCOUNTER — Ambulatory Visit: Admitting: Gastroenterology

## 2024-01-28 LAB — URINE CULTURE

## 2024-01-31 ENCOUNTER — Other Ambulatory Visit: Payer: Self-pay

## 2024-01-31 MED ORDER — NITROFURANTOIN MONOHYD MACRO 100 MG PO CAPS
100.0000 mg | ORAL_CAPSULE | Freq: Two times a day (BID) | ORAL | 0 refills | Status: DC
  Filled 2024-01-31: qty 14, 7d supply, fill #0

## 2024-02-01 ENCOUNTER — Other Ambulatory Visit: Payer: Self-pay

## 2024-02-01 MED ORDER — METFORMIN HCL ER 500 MG PO TB24
500.0000 mg | ORAL_TABLET | Freq: Two times a day (BID) | ORAL | 6 refills | Status: AC
  Filled 2024-02-01: qty 60, 30d supply, fill #0
  Filled 2024-03-10: qty 60, 30d supply, fill #1
  Filled 2024-04-07: qty 60, 30d supply, fill #2

## 2024-02-04 ENCOUNTER — Encounter: Payer: Self-pay | Admitting: Surgery

## 2024-02-04 ENCOUNTER — Ambulatory Visit: Admitting: Surgery

## 2024-02-04 VITALS — BP 142/91 | HR 80 | Temp 97.9°F | Ht 64.0 in | Wt 171.0 lb

## 2024-02-04 DIAGNOSIS — D170 Benign lipomatous neoplasm of skin and subcutaneous tissue of head, face and neck: Secondary | ICD-10-CM | POA: Diagnosis not present

## 2024-02-04 DIAGNOSIS — D1722 Benign lipomatous neoplasm of skin and subcutaneous tissue of left arm: Secondary | ICD-10-CM | POA: Diagnosis not present

## 2024-02-04 NOTE — Progress Notes (Signed)
 Outpatient Surgical Follow Up  02/04/2024  Brittany Ochoa is an 58 y.o. female.   Chief Complaint  Patient presents with   Follow-up    HPI: Brittany Ochoa is a 58 y.o. female seen for an neck mass.  Dr. Desiderio previously saw her and felt that neck lipoma was deep for his comfort level. The patient reports that she has had this for many years since at least 2008.  However over time this has been growing in size.  She had a CT scan of the neck in 2022 which showed a 4.9 x 4 x 5 cm mass in the left supraclavicular neck likely to be a lipoma.  She endorses that she had the lipoma partially removed by her plastic surgeon close to 18 years ago..  The patient reports that over the years, this mass in the left neck has continued to grow.  It has resulted in sometimes feeling of tingliness in the left neck area and shoulder area.  Separately, the patient also reports having a lipoma in the left forearm .  The forearm lipoma is also symptomatic but she does not have any issues with the back lipoma.   SHe did have a recent CT of the neck that I have personally reviewed showing enlarging left neck lipoma measures 7.2 cm with benign features.  Past Medical History:  Diagnosis Date   Allergy 2015   seasonal   Arthritis    Ascending aortic aneurysm    a. 06/2018 CTA Chest: 4.1 x 4.0 cm fusiform Asc Ao aneurysm-unchanged.  Descending thoracic aorta 2.1 cm.   Asthma    exercised induced   Autonomic dysfunction with vaccines    Dyspnea on exertion 2019   a. 06/2017 echo: EF 60 to 65%.  No regional wall motion abnormalities.  Ascending aortic diameter 3.9 cm.  Mild MR.   Endometrial polyp    Gastric erythema    History of hypertension no medications since 2020    Lipoma    of neck and chest   Multiple thyroid  nodules    OCD (obsessive compulsive disorder)    Pre-diabetes    Reflux esophagitis     Past Surgical History:  Procedure Laterality Date   colonscopy  12/16/2021   endoscopy done  also   COSMETIC SURGERY  1997   breast reduction   DILATATION & CURETTAGE/HYSTEROSCOPY WITH MYOSURE N/A 10/18/2022   Procedure: DILATATION & CURETTAGE/HYSTEROSCOPY WITH POLYPECTOMY BY MYOSURE;  Surgeon: Kandyce Sor, MD;  Location: [facility];  Service: Gynecology;  Laterality: N/A;   ESOPHAGOGASTRODUODENOSCOPY (EGD) WITH PROPOFOL  N/A 01/24/2018   Procedure: ESOPHAGOGASTRODUODENOSCOPY (EGD) WITH PROPOFOL ;  Surgeon: Janalyn Keene NOVAK, MD;  Location: [facility];  Service: Endoscopy;  Laterality: N/A;   ESOPHAGOGASTRODUODENOSCOPY (EGD) WITH PROPOFOL  N/A 09/09/2020   Procedure: ESOPHAGOGASTRODUODENOSCOPY (EGD) WITH PROPOFOL ;  Surgeon: Janalyn Keene NOVAK, MD;  Location: [facility];  Service: Endoscopy;  Laterality: N/A;   ESOPHAGOGASTRODUODENOSCOPY (EGD) WITH PROPOFOL  N/A 02/17/2021   Procedure: ESOPHAGOGASTRODUODENOSCOPY (EGD) WITH PROPOFOL ;  Surgeon: Janalyn Keene NOVAK, MD;  Location: [facility];  Service: Endoscopy;  Laterality: N/A;   lipoma removal  2007   back   REDUCTION MAMMAPLASTY Bilateral 12/01/1996    Family History  Problem Relation Age of Onset   Diabetes Mother    Arthritis Mother    Obesity Mother    Miscarriages / [place] Mother    Diabetes Father    Heart disease Father    Heart attack Father    Stomach cancer Father  66   Cancer Father    Asthma Brother    Anxiety disorder Brother    ADD / ADHD Brother    Osteoporosis Maternal Grandmother    Arthritis Maternal Grandmother    Varicose Veins Maternal Grandmother    Heart disease Paternal Grandmother    Stomach cancer Paternal Grandmother    Breast cancer Paternal Grandmother    Cancer Paternal Grandmother    Cancer Paternal Grandfather        spleen   Heart disease Paternal Grandfather    Alcohol abuse Maternal Uncle    Cancer Paternal Aunt        rare blood cancer; non genetic   Diabetes Paternal Aunt    Cancer Paternal Uncle        rare blood cancer; non genetic    Cancer Paternal Aunt    Alcohol abuse Maternal Uncle     Social History:  reports that she quit smoking about 19 years ago. Her smoking use included cigarettes. She started smoking about 44 years ago. She has a 50 pack-year smoking history. She has been exposed to tobacco smoke. She has never used smokeless tobacco. She reports that she does not currently use alcohol. She reports that she does not use drugs.  Allergies:  Allergies  Allergen Reactions   Codeine Anaphylaxis   Pholcodine     Other reaction(s): wheezing cannot ake due to aneurysm   Dilaudid  [Hydromorphone ] Other (See Comments)   Medical Provider Ez Flu Shot [Influenza Vac Split Quad] Other (See Comments)    Severe myalgias and bone pain Any vaccine sets off dysnomia and wind up in hospital   Quinolones     Aortic enlargement    Tirzepatide      Other Reaction(s): Hypertension/Headache sets off dysnomina    Medications reviewed.    ROS Full ROS performed and is otherwise negative other than what is stated in HPI   BP (!) 142/91   Pulse 80   Temp 97.9 F (36.6 C) (Oral)   Ht 5' 4 (1.626 m)   Wt 171 lb (77.6 kg)   LMP 06/01/2019   SpO2 98%   BMI 29.35 kg/m   Physical Exam  Physical Exam NAD , chaperone present CONSTITUTIONAL: NAD, well-nourished HEENT:  Normocephalic, atraumatic, extraocular motion intact. RESPIRATORY:  Normal respiratory effort without pathologic use of accessory muscles. CARDIOVASCULAR: Regular rhythm and rate. No murmurs Neck: 7-8cm mass in the left supraclavicular area, soft and mobile consistent with this lipoma, prior scar also noted/.  It is soft and nontender.   Trachea is midline, no palpable LAD MSK: 4-5 cm mass in the left forearm which is soft, mobile, and nontender, consistent with a lipoma.  She does have some noticeable although mild edema of the left upper extremity compared to the right upper extremity.   ABd: soft, nt, no masses SKIN: Skin turgor is normal. There are no  pathologic skin lesions.  NEUROLOGIC:  Motor and sensation is grossly normal.  Cranial nerves are grossly intact. PSYCH:  Alert and oriented to person, place and time. Affect is normal.   Assessment/Plan: 58 year old female with symptomatic soft tissue neck mass consistent with lipoma as well as a symptomatic and enlarging left forearm lipoma.  She wishes excision of both lesions .  Discussed with her in detail about her disease process.  We also discussed the need for excision under anesthesia in the operating room given its location.  I have personally reviewed the CT scan and I think that is she  will to have this excised without any major vascular involvement. I do have experience with neck surgery and neck soft tissue masses and feel comfortable performing the excision We Did talk about the procedure the risk the benefits and the possible complications including but not limited to: Bleeding, infection, nerve injury, pain, seroma.  She understands and wishes to proceed I personally spent a total of 30 minutes in the care of the patient today including performing a medically appropriate exam/evaluation, counseling and educating, placing orders, referring and communicating with other health care professionals, documenting clinical information in the EHR, independently interpreting and reviewing images studies and coordinating care.   Laneta Luna, MD [facility] General Surgeon

## 2024-02-04 NOTE — Patient Instructions (Signed)
 We have spoken today about removing a Lipoma. This will be done by Dr. Jordis at [facility].  If you are on any injectable weight loss medication, you will need to stop taking your GLP-1 injectable (weight loss) medications 8 days before your surgery to avoid any complications with anesthesia.   You will most likely be able to leave the hospital several hours after your surgery. You may need to have a drain placed after surgery, this depends on the size of your lipoma.  Plan to tentatively be off work for up to1 week following the surgery.  Please see your Blue surgery sheet for more information. Our surgery scheduler will call you to look at surgery dates and to go over information.   If you have FMLA or Disability paperwork that needs to be filled out, please have your company fax your paperwork to [phone] or you may drop this by either office. This paperwork will be filled out within 3 days after your surgery has been completed.  Lipoma Removal  Lipoma removal is a surgical procedure to remove a lipoma, which is a noncancerous (benign) tumor that is made up of fat cells. Most lipomas are small and painless and do not require treatment. They can form in many areas of the body but are most common under the skin of the back, arms, shoulders, buttocks, and thighs. You may need lipoma removal if you have a lipoma that is large, growing, or causing discomfort. Lipoma removal may also be done for cosmetic reasons. Tell a health care provider about: Any allergies you have. All medicines you are taking, including vitamins, herbs, eye drops, creams, and over-the-counter medicines. Any problems you or family members have had with anesthetic medicines. Any bleeding problems you have. Any surgeries you have had. Any medical conditions you have. Whether you are pregnant or may be pregnant. What are the risks? Generally, this is a safe procedure. However, problems may occur,  including: Infection. Bleeding. Scarring. Allergic reactions to medicines. Damage to nearby structures or organs, such as damage to nerves or blood vessels near the lipoma. What happens before the procedure? Medicines Ask your health care provider about: Changing or stopping your regular medicines. This is especially important if you are taking diabetes medicines or blood thinners. Taking medicines such as aspirin and ibuprofen . These medicines can thin your blood. Do not take these medicines unless your health care provider tells you to take them. Taking over-the-counter medicines, vitamins, herbs, and supplements. General instructions You will have a physical exam. Your health care provider will check the size of the lipoma and whether it can be removed easily. You may have a biopsy and imaging tests, such as X-rays, a CT scan, and an MRI. Do not use any products that contain nicotine or tobacco for at least 4 weeks before the procedure. These products include cigarettes, chewing tobacco, and vaping devices, such as e-cigarettes. If you need help quitting, ask your health care provider. Ask your health care provider: How your surgery site will be marked. What steps will be taken to help prevent infection. These may include: Washing skin with a germ-killing soap. Taking antibiotic medicine. If you will be going home right after the procedure, plan to have a responsible adult: Take you home from the hospital or clinic. You will not be allowed to drive. Care for you for the time you are told. What happens during the procedure?  An IV will be inserted into one of your veins. You will  be given one or more of the following: A medicine to help you relax (sedative). A medicine to numb the area (local anesthetic). A medicine to make you fall asleep (general anesthetic). A medicine that is injected into an area of your body to numb everything below the injection site (regional anesthetic). An  incision will be made into the skin over the lipoma or very near the lipoma. The incision may be made in a natural skin line or crease. Tissues, nerves, and blood vessels near the lipoma will be moved out of the way. The lipoma and the capsule that surrounds it will be separated from the surrounding tissues. The lipoma will be removed. You may have a drain placed depending on the size of your lipoma The incision may be closed with stitches (sutures). A bandage (dressing) will be placed over the incision. The procedure may vary among health care providers and hospitals. What happens after the procedure? Your blood pressure, heart rate, breathing rate, and blood oxygen  level will be monitored until you leave the hospital or clinic. If you were prescribed an antibiotic medicine, use it as told by your health care provider. Do not stop using the antibiotic even if you start to feel better. If you were given a sedative during the procedure, it can affect you for several hours. Do not drive or operate machinery until your health care provider says that it is safe. Where to find more information OrthoInfo: orthoinfo.aaos.org Summary Before the procedure, follow instructions from your health care provider about eating and drinking, and changing or stopping your regular medicines. This is especially important if you are taking diabetes medicines or blood thinners. After the lipoma is removed, the incision may be closed with stitches (sutures) and covered with a bandage (dressing). If you were given a sedative during the procedure, it can affect you for several hours. Do not drive or operate machinery until your health care provider says that it is safe.

## 2024-02-04 NOTE — H&P (View-Only) (Signed)
 Outpatient Surgical Follow Up  02/04/2024  Brittany Ochoa is an 58 y.o. female.   Chief Complaint  Patient presents with   Follow-up    HPI: Brittany Ochoa is a 58 y.o. female seen for an neck mass.  Dr. Desiderio previously saw her and felt that neck lipoma was deep for his comfort level. The patient reports that she has had this for many years since at least 2008.  However over time this has been growing in size.  She had a CT scan of the neck in 2022 which showed a 4.9 x 4 x 5 cm mass in the left supraclavicular neck likely to be a lipoma.  She endorses that she had the lipoma partially removed by her plastic surgeon close to 18 years ago..  The patient reports that over the years, this mass in the left neck has continued to grow.  It has resulted in sometimes feeling of tingliness in the left neck area and shoulder area.  Separately, the patient also reports having a lipoma in the left forearm .  The forearm lipoma is also symptomatic but she does not have any issues with the back lipoma.   SHe did have a recent CT of the neck that I have personally reviewed showing enlarging left neck lipoma measures 7.2 cm with benign features.  Past Medical History:  Diagnosis Date   Allergy 2015   seasonal   Arthritis    Ascending aortic aneurysm    a. 06/2018 CTA Chest: 4.1 x 4.0 cm fusiform Asc Ao aneurysm-unchanged.  Descending thoracic aorta 2.1 cm.   Asthma    exercised induced   Autonomic dysfunction with vaccines    Dyspnea on exertion 2019   a. 06/2017 echo: EF 60 to 65%.  No regional wall motion abnormalities.  Ascending aortic diameter 3.9 cm.  Mild MR.   Endometrial polyp    Gastric erythema    History of hypertension no medications since 2020    Lipoma    of neck and chest   Multiple thyroid  nodules    OCD (obsessive compulsive disorder)    Pre-diabetes    Reflux esophagitis     Past Surgical History:  Procedure Laterality Date   colonscopy  12/16/2021   endoscopy done  also   COSMETIC SURGERY  1997   breast reduction   DILATATION & CURETTAGE/HYSTEROSCOPY WITH MYOSURE N/A 10/18/2022   Procedure: DILATATION & CURETTAGE/HYSTEROSCOPY WITH POLYPECTOMY BY MYOSURE;  Surgeon: Kandyce Sor, MD;  Location: [facility];  Service: Gynecology;  Laterality: N/A;   ESOPHAGOGASTRODUODENOSCOPY (EGD) WITH PROPOFOL  N/A 01/24/2018   Procedure: ESOPHAGOGASTRODUODENOSCOPY (EGD) WITH PROPOFOL ;  Surgeon: Janalyn Keene NOVAK, MD;  Location: [facility];  Service: Endoscopy;  Laterality: N/A;   ESOPHAGOGASTRODUODENOSCOPY (EGD) WITH PROPOFOL  N/A 09/09/2020   Procedure: ESOPHAGOGASTRODUODENOSCOPY (EGD) WITH PROPOFOL ;  Surgeon: Janalyn Keene NOVAK, MD;  Location: [facility];  Service: Endoscopy;  Laterality: N/A;   ESOPHAGOGASTRODUODENOSCOPY (EGD) WITH PROPOFOL  N/A 02/17/2021   Procedure: ESOPHAGOGASTRODUODENOSCOPY (EGD) WITH PROPOFOL ;  Surgeon: Janalyn Keene NOVAK, MD;  Location: [facility];  Service: Endoscopy;  Laterality: N/A;   lipoma removal  2007   back   REDUCTION MAMMAPLASTY Bilateral 12/01/1996    Family History  Problem Relation Age of Onset   Diabetes Mother    Arthritis Mother    Obesity Mother    Miscarriages / [place] Mother    Diabetes Father    Heart disease Father    Heart attack Father    Stomach cancer Father  66   Cancer Father    Asthma Brother    Anxiety disorder Brother    ADD / ADHD Brother    Osteoporosis Maternal Grandmother    Arthritis Maternal Grandmother    Varicose Veins Maternal Grandmother    Heart disease Paternal Grandmother    Stomach cancer Paternal Grandmother    Breast cancer Paternal Grandmother    Cancer Paternal Grandmother    Cancer Paternal Grandfather        spleen   Heart disease Paternal Grandfather    Alcohol abuse Maternal Uncle    Cancer Paternal Aunt        rare blood cancer; non genetic   Diabetes Paternal Aunt    Cancer Paternal Uncle        rare blood cancer; non genetic    Cancer Paternal Aunt    Alcohol abuse Maternal Uncle     Social History:  reports that she quit smoking about 19 years ago. Her smoking use included cigarettes. She started smoking about 44 years ago. She has a 50 pack-year smoking history. She has been exposed to tobacco smoke. She has never used smokeless tobacco. She reports that she does not currently use alcohol. She reports that she does not use drugs.  Allergies:  Allergies  Allergen Reactions   Codeine Anaphylaxis   Pholcodine     Other reaction(s): wheezing cannot ake due to aneurysm   Dilaudid  [Hydromorphone ] Other (See Comments)   Medical Provider Ez Flu Shot [Influenza Vac Split Quad] Other (See Comments)    Severe myalgias and bone pain Any vaccine sets off dysnomia and wind up in hospital   Quinolones     Aortic enlargement    Tirzepatide      Other Reaction(s): Hypertension/Headache sets off dysnomina    Medications reviewed.    ROS Full ROS performed and is otherwise negative other than what is stated in HPI   BP (!) 142/91   Pulse 80   Temp 97.9 F (36.6 C) (Oral)   Ht 5' 4 (1.626 m)   Wt 171 lb (77.6 kg)   LMP 06/01/2019   SpO2 98%   BMI 29.35 kg/m   Physical Exam  Physical Exam NAD , chaperone present CONSTITUTIONAL: NAD, well-nourished HEENT:  Normocephalic, atraumatic, extraocular motion intact. RESPIRATORY:  Normal respiratory effort without pathologic use of accessory muscles. CARDIOVASCULAR: Regular rhythm and rate. No murmurs Neck: 7-8cm mass in the left supraclavicular area, soft and mobile consistent with this lipoma, prior scar also noted/.  It is soft and nontender.   Trachea is midline, no palpable LAD MSK: 4-5 cm mass in the left forearm which is soft, mobile, and nontender, consistent with a lipoma.  She does have some noticeable although mild edema of the left upper extremity compared to the right upper extremity.   ABd: soft, nt, no masses SKIN: Skin turgor is normal. There are no  pathologic skin lesions.  NEUROLOGIC:  Motor and sensation is grossly normal.  Cranial nerves are grossly intact. PSYCH:  Alert and oriented to person, place and time. Affect is normal.   Assessment/Plan: 58 year old female with symptomatic soft tissue neck mass consistent with lipoma as well as a symptomatic and enlarging left forearm lipoma.  She wishes excision of both lesions .  Discussed with her in detail about her disease process.  We also discussed the need for excision under anesthesia in the operating room given its location.  I have personally reviewed the CT scan and I think that is she  will to have this excised without any major vascular involvement. I do have experience with neck surgery and neck soft tissue masses and feel comfortable performing the excision We Did talk about the procedure the risk the benefits and the possible complications including but not limited to: Bleeding, infection, nerve injury, pain, seroma.  She understands and wishes to proceed I personally spent a total of 30 minutes in the care of the patient today including performing a medically appropriate exam/evaluation, counseling and educating, placing orders, referring and communicating with other health care professionals, documenting clinical information in the EHR, independently interpreting and reviewing images studies and coordinating care.   Laneta Luna, MD [facility] General Surgeon

## 2024-02-15 ENCOUNTER — Ambulatory Visit: Admitting: Family Medicine

## 2024-02-19 ENCOUNTER — Ambulatory Visit: Admitting: Family Medicine

## 2024-02-19 ENCOUNTER — Encounter: Payer: Self-pay | Admitting: Family Medicine

## 2024-02-19 NOTE — Telephone Encounter (Signed)
 Scheduled for 4:20 this afternoon, patient aware.

## 2024-02-19 NOTE — Telephone Encounter (Signed)
 I can see her at 4:20 today, but otherwise I'm not here the rest of the week in the PM

## 2024-02-21 ENCOUNTER — Inpatient Hospital Stay: Admission: RE | Admit: 2024-02-21 | Source: Ambulatory Visit

## 2024-02-21 ENCOUNTER — Other Ambulatory Visit

## 2024-02-21 ENCOUNTER — Ambulatory Visit: Admitting: Family Medicine

## 2024-02-22 ENCOUNTER — Other Ambulatory Visit: Payer: Self-pay

## 2024-02-22 ENCOUNTER — Encounter
Admission: RE | Admit: 2024-02-22 | Discharge: 2024-02-22 | Disposition: A | Discharge status: Home / Self Care | Source: Ambulatory Visit | Attending: Surgery | Admitting: Surgery

## 2024-02-22 VITALS — Ht 64.0 in | Wt 169.0 lb

## 2024-02-22 DIAGNOSIS — R7303 Prediabetes: Secondary | ICD-10-CM

## 2024-02-22 DIAGNOSIS — Z01818 Encounter for other preprocedural examination: Secondary | ICD-10-CM

## 2024-02-22 DIAGNOSIS — R7301 Impaired fasting glucose: Secondary | ICD-10-CM

## 2024-02-22 NOTE — Patient Instructions (Addendum)
 Your procedure is scheduled on: 02/28/24 Report to the Registration Desk on the 1st floor of the [facility]. To find out your arrival time, please call [phone] between 1PM - 3PM on: 02/27/24 If your arrival time is 6:00 am, do not arrive before that time as the [facility] entrance doors do not open until 6:00 am.  REMEMBER: Instructions that are not followed completely may result in serious medical risk, up to and including death; or upon the discretion of your surgeon and anesthesiologist your surgery may need to be rescheduled.  Do not eat food after midnight the night before surgery.  No gum chewing or hard candies.  You may however, drink CLEAR liquids up to 2 hours before you are scheduled to arrive for your surgery. Do not drink anything within 2 hours of your scheduled arrival time.  Clear liquids include: - water  - apple juice without pulp - gatorade (not RED colors) - black coffee or tea (Do NOT add milk or creamers to the coffee or tea) Do NOT drink anything that is not on this list.  One week prior to surgery: Stop Anti-inflammatories (NSAIDS) such as Advil , Aleve, Ibuprofen , Motrin , Naproxen, Naprosyn and Aspirin based products such as Excedrin, Goody's Powder, BC Powder.   Stop ANY OVER THE COUNTER supplements until after surgery.  STOP metFORMIN  (GLUCOPHAGE -XR) 2 DAYS before surgery.   You may however, continue to take Tylenol  if needed for pain up until the day of surgery.   Continue taking all of your other prescription medications up until the day of surgery.  ON THE DAY OF SURGERY ONLY TAKE THESE MEDICATIONS WITH SIPS OF WATER:  None  No Alcohol for 24 hours before or after surgery.  No Smoking including e-cigarettes for 24 hours before surgery.  No chewable tobacco products for at least 6 hours before surgery.  No nicotine patches on the day of surgery.  Do not use any recreational drugs for at least a week (preferably 2 weeks) before your  surgery.  Please be advised that the combination of cocaine and anesthesia may have negative outcomes, up to and including death. If you test positive for cocaine, your surgery will be cancelled.  On the morning of surgery brush your teeth with toothpaste and water, you may rinse your mouth with mouthwash if you wish. Do not swallow any toothpaste or mouthwash.  Use CHG Soap or wipes as directed on instruction sheet.  Do not wear jewelry, make-up, hairpins, clips or nail polish.  For welded (permanent) jewelry: bracelets, anklets, waist bands, etc.  Please have this removed prior to surgery.  If it is not removed, there is a chance that hospital personnel will need to cut it off on the day of surgery.  Do not wear lotions, powders, or perfumes.   Do not shave body hair from the neck down 48 hours before surgery.  Contact lenses, hearing aids and dentures may not be worn into surgery.  Do not bring valuables to the hospital. [facility] is not responsible for any missing/lost belongings or valuables.   Notify your doctor if there is any change in your medical condition (cold, fever, infection).  Wear comfortable clothing (specific to your surgery type) to the hospital.  After surgery, you can help prevent lung complications by doing breathing exercises.  Take deep breaths and cough every 1-2 hours. Your doctor may order a device called an Incentive Spirometer to help you take deep breaths.  If you are being discharged the  day of surgery, you will not be allowed to drive home. You will need a responsible individual to drive you home and stay with you for 24 hours after surgery.   If you are taking public transportation, you will need to have a responsible individual with you.  Please call the Pre-admissions Testing Dept. at [phone] if you have any questions about these instructions.  Surgery Visitation Policy:  Patients having surgery or a procedure may have two visitors.   Children under the age of 66 must have an adult with them who is not the patient.  Merchandiser, Retail to address health-related social needs:  [email or web address]                                                                                                             Preparing for Surgery with CHLORHEXIDINE GLUCONATE (CHG) Soap  Chlorhexidine Gluconate (CHG) Soap  o An antiseptic cleaner that kills germs and bonds with the skin to continue killing germs even after washing  o Used for showering the night before surgery and morning of surgery  Before surgery, you can play an important role by reducing the number of germs on your skin.  CHG (Chlorhexidine gluconate) soap is an antiseptic cleanser which kills germs and bonds with the skin to continue killing germs even after washing.  Please do not use if you have an allergy to CHG or antibacterial soaps. If your skin becomes reddened/irritated stop using the CHG.  1. Shower the NIGHT BEFORE SURGERY with CHG soap.  2. If you choose to wash your hair, wash your hair first as usual with your normal shampoo.  3. After shampooing, rinse your hair and body thoroughly to remove the shampoo.  4. Use CHG as you would any other liquid soap. You can apply CHG directly to the skin and wash gently with a clean washcloth.  5. Apply the CHG soap to your body only from the neck down. Do not use on open wounds or open sores. Avoid contact with your eyes, ears, mouth, and genitals (private parts). Wash face and genitals (private parts) with your normal soap.  6. Wash thoroughly, paying special attention to the area where your surgery will be performed.  7. Thoroughly rinse your body with warm water.  8. Do not shower/wash with your normal soap after using and rinsing off the CHG soap.  9. Do not use lotions, oils, etc., after showering with CHG.  10. Pat yourself dry with a clean towel.  11. Wear clean pajamas to bed  the night before surgery.  12. Place clean sheets on your bed the night of your shower and do not sleep with pets.  13. Do not apply any deodorants/lotions/powders.  14. Please wear clean clothes to the hospital.  15. Remember to brush your teeth with your regular toothpaste.

## 2024-02-25 ENCOUNTER — Encounter: Payer: Self-pay | Admitting: Surgery

## 2024-02-26 ENCOUNTER — Inpatient Hospital Stay: Admitting: Family Medicine

## 2024-02-28 ENCOUNTER — Ambulatory Visit
Admission: RE | Admit: 2024-02-28 | Discharge: 2024-02-28 | Disposition: A | Discharge status: Home / Self Care | Attending: Surgery | Admitting: Surgery

## 2024-02-28 ENCOUNTER — Ambulatory Visit: Payer: Self-pay | Admitting: Urgent Care

## 2024-02-28 ENCOUNTER — Encounter: Payer: Self-pay | Admitting: Surgery

## 2024-02-28 ENCOUNTER — Other Ambulatory Visit: Payer: Self-pay

## 2024-02-28 ENCOUNTER — Encounter: Admission: RE | Disposition: A | Payer: Self-pay | Source: Home / Self Care | Attending: Surgery

## 2024-02-28 DIAGNOSIS — J45909 Unspecified asthma, uncomplicated: Secondary | ICD-10-CM | POA: Diagnosis not present

## 2024-02-28 DIAGNOSIS — D1722 Benign lipomatous neoplasm of skin and subcutaneous tissue of left arm: Secondary | ICD-10-CM | POA: Insufficient documentation

## 2024-02-28 DIAGNOSIS — Z7984 Long term (current) use of oral hypoglycemic drugs: Secondary | ICD-10-CM | POA: Diagnosis not present

## 2024-02-28 DIAGNOSIS — R7301 Impaired fasting glucose: Secondary | ICD-10-CM

## 2024-02-28 DIAGNOSIS — E119 Type 2 diabetes mellitus without complications: Secondary | ICD-10-CM | POA: Insufficient documentation

## 2024-02-28 DIAGNOSIS — Z01818 Encounter for other preprocedural examination: Secondary | ICD-10-CM

## 2024-02-28 DIAGNOSIS — Z87891 Personal history of nicotine dependence: Secondary | ICD-10-CM | POA: Insufficient documentation

## 2024-02-28 DIAGNOSIS — D170 Benign lipomatous neoplasm of skin and subcutaneous tissue of head, face and neck: Secondary | ICD-10-CM | POA: Diagnosis not present

## 2024-02-28 DIAGNOSIS — M1990 Unspecified osteoarthritis, unspecified site: Secondary | ICD-10-CM | POA: Insufficient documentation

## 2024-02-28 DIAGNOSIS — D1720 Benign lipomatous neoplasm of skin and subcutaneous tissue of unspecified limb: Secondary | ICD-10-CM | POA: Diagnosis not present

## 2024-02-28 HISTORY — DX: Exercise induced bronchospasm: J45.990

## 2024-02-28 HISTORY — PX: EXCISION MASS UPPER EXTREMETIES: SHX6704

## 2024-02-28 HISTORY — PX: EXCISION MASS NECK: SHX6703

## 2024-02-28 LAB — GLUCOSE, CAPILLARY
Glucose-Capillary: 103 mg/dL — ABNORMAL HIGH (ref 70–99)
Glucose-Capillary: 134 mg/dL — ABNORMAL HIGH (ref 70–99)

## 2024-02-28 SURGERY — EXCISION, MASS, NECK
Anesthesia: General | Site: Neck | Laterality: Left

## 2024-02-28 MED ORDER — DROPERIDOL 2.5 MG/ML IJ SOLN
INTRAMUSCULAR | Status: AC
  Filled 2024-02-28: qty 2

## 2024-02-28 MED ORDER — CEFAZOLIN SODIUM-DEXTROSE 2-4 GM/100ML-% IV SOLN
2.0000 g | INTRAVENOUS | Status: AC
  Administered 2024-02-28: 2 g via INTRAVENOUS

## 2024-02-28 MED ORDER — ONDANSETRON HCL 4 MG/2ML IJ SOLN
INTRAMUSCULAR | Status: DC | PRN
Start: 2024-02-28 — End: 2024-02-28
  Administered 2024-02-28: 4 mg via INTRAVENOUS

## 2024-02-28 MED ORDER — LIDOCAINE HCL (PF) 2 % IJ SOLN
INTRAMUSCULAR | Status: AC
  Filled 2024-02-28: qty 5

## 2024-02-28 MED ORDER — CHLORHEXIDINE GLUCONATE CLOTH 2 % EX PADS
6.0000 | MEDICATED_PAD | Freq: Once | CUTANEOUS | Status: AC
  Administered 2024-02-28: 6 via TOPICAL

## 2024-02-28 MED ORDER — LIDOCAINE HCL (CARDIAC) PF 100 MG/5ML IV SOSY
PREFILLED_SYRINGE | INTRAVENOUS | Status: DC | PRN
  Administered 2024-02-28: 100 mg via INTRAVENOUS

## 2024-02-28 MED ORDER — ACETAMINOPHEN 500 MG PO TABS
1000.0000 mg | ORAL_TABLET | ORAL | Status: AC
  Administered 2024-02-28: 1000 mg via ORAL

## 2024-02-28 MED ORDER — EPHEDRINE SULFATE-NACL 50-0.9 MG/10ML-% IV SOSY
PREFILLED_SYRINGE | INTRAVENOUS | Status: DC | PRN
  Administered 2024-02-28: 10 mg via INTRAVENOUS
  Administered 2024-02-28 (×3): 5 mg via INTRAVENOUS

## 2024-02-28 MED ORDER — OXYCODONE HCL 5 MG PO TABS: 5.0000 mg | ORAL_TABLET | Freq: Once | ORAL | Status: DC | PRN

## 2024-02-28 MED ORDER — ONDANSETRON HCL 4 MG/2ML IJ SOLN
INTRAMUSCULAR | Status: AC
  Filled 2024-02-28: qty 2

## 2024-02-28 MED ORDER — VISTASEAL 10 ML SINGLE DOSE KIT
PACK | CUTANEOUS | Status: AC
  Filled 2024-02-28: qty 10

## 2024-02-28 MED ORDER — HYDROCODONE-ACETAMINOPHEN 5-325 MG PO TABS
1.0000 | ORAL_TABLET | Freq: Four times a day (QID) | ORAL | 0 refills | Status: DC | PRN
  Filled 2024-02-28: qty 20, 3d supply, fill #0

## 2024-02-28 MED ORDER — VISTASEAL 10 ML SINGLE DOSE KIT
PACK | CUTANEOUS | Status: DC | PRN
  Administered 2024-02-28: 10 mL via TOPICAL

## 2024-02-28 MED ORDER — PROPOFOL 10 MG/ML IV BOLUS
INTRAVENOUS | Status: DC | PRN
Start: 2024-02-28 — End: 2024-02-28
  Administered 2024-02-28: 150 mg via INTRAVENOUS
  Administered 2024-02-28: 50 mg via INTRAVENOUS

## 2024-02-28 MED ORDER — GABAPENTIN 300 MG PO CAPS
ORAL_CAPSULE | ORAL | Status: AC
  Filled 2024-02-28: qty 1

## 2024-02-28 MED ORDER — GABAPENTIN 300 MG PO CAPS
300.0000 mg | ORAL_CAPSULE | ORAL | Status: AC
  Administered 2024-02-28: 300 mg via ORAL

## 2024-02-28 MED ORDER — FENTANYL CITRATE (PF) 100 MCG/2ML IJ SOLN
INTRAMUSCULAR | Status: AC
  Filled 2024-02-28: qty 2

## 2024-02-28 MED ORDER — BUPIVACAINE-EPINEPHRINE (PF) 0.25% -1:200000 IJ SOLN
INTRAMUSCULAR | Status: AC
  Filled 2024-02-28: qty 30

## 2024-02-28 MED ORDER — ACETAMINOPHEN 500 MG PO TABS
ORAL_TABLET | ORAL | Status: AC
  Filled 2024-02-28: qty 2

## 2024-02-28 MED ORDER — 0.9 % SODIUM CHLORIDE (POUR BTL) OPTIME
TOPICAL | Status: DC | PRN
  Administered 2024-02-28: 120 mL

## 2024-02-28 MED ORDER — ORAL CARE MOUTH RINSE: 15.0000 mL | Freq: Once | OROMUCOSAL | Status: AC

## 2024-02-28 MED ORDER — CHLORHEXIDINE GLUCONATE 0.12 % MT SOLN
OROMUCOSAL | Status: AC
  Filled 2024-02-28: qty 15

## 2024-02-28 MED ORDER — OXYCODONE HCL 5 MG PO TABS
ORAL_TABLET | ORAL | Status: AC
  Filled 2024-02-28: qty 1

## 2024-02-28 MED ORDER — PROPOFOL 10 MG/ML IV BOLUS
INTRAVENOUS | Status: AC
  Filled 2024-02-28: qty 20

## 2024-02-28 MED ORDER — CEFAZOLIN SODIUM-DEXTROSE 2-4 GM/100ML-% IV SOLN
INTRAVENOUS | Status: AC
  Filled 2024-02-28: qty 100

## 2024-02-28 MED ORDER — DEXAMETHASONE SOD PHOSPHATE PF 10 MG/ML IJ SOLN
INTRAMUSCULAR | Status: DC | PRN
Start: 2024-02-28 — End: 2024-02-28
  Administered 2024-02-28: 10 mg via INTRAVENOUS

## 2024-02-28 MED ORDER — FENTANYL CITRATE (PF) 100 MCG/2ML IJ SOLN
25.0000 ug | INTRAMUSCULAR | Status: DC | PRN
  Administered 2024-02-28: 25 ug via INTRAVENOUS

## 2024-02-28 MED ORDER — ONDANSETRON HCL 4 MG/2ML IJ SOLN
4.0000 mg | Freq: Once | INTRAMUSCULAR | Status: AC | PRN
  Administered 2024-02-28: 4 mg via INTRAVENOUS

## 2024-02-28 MED ORDER — DROPERIDOL 2.5 MG/ML IJ SOLN
0.6250 mg | Freq: Once | INTRAMUSCULAR | Status: AC
  Administered 2024-02-28: 0.625 mg via INTRAVENOUS

## 2024-02-28 MED ORDER — EPHEDRINE 5 MG/ML INJ
INTRAVENOUS | Status: AC
  Filled 2024-02-28: qty 5

## 2024-02-28 MED ORDER — CHLORHEXIDINE GLUCONATE 0.12 % MT SOLN
15.0000 mL | Freq: Once | OROMUCOSAL | Status: AC
  Administered 2024-02-28: 15 mL via OROMUCOSAL

## 2024-02-28 MED ORDER — OXYCODONE HCL 5 MG/5ML PO SOLN: 5.0000 mg | Freq: Once | ORAL | Status: DC | PRN

## 2024-02-28 MED ORDER — FENTANYL CITRATE (PF) 100 MCG/2ML IJ SOLN
INTRAMUSCULAR | Status: DC | PRN
  Administered 2024-02-28 (×3): 50 ug via INTRAVENOUS

## 2024-02-28 MED ORDER — MIDAZOLAM HCL 2 MG/2ML IJ SOLN
INTRAMUSCULAR | Status: AC
  Filled 2024-02-28: qty 2

## 2024-02-28 MED ORDER — CELECOXIB 200 MG PO CAPS
200.0000 mg | ORAL_CAPSULE | ORAL | Status: AC
  Administered 2024-02-28: 200 mg via ORAL

## 2024-02-28 MED ORDER — CELECOXIB 200 MG PO CAPS
ORAL_CAPSULE | ORAL | Status: AC
Start: 2024-02-28 — End: 2024-02-28
  Filled 2024-02-28: qty 1

## 2024-02-28 MED ORDER — MIDAZOLAM HCL (PF) 2 MG/2ML IJ SOLN
INTRAMUSCULAR | Status: DC | PRN
  Administered 2024-02-28: 2 mg via INTRAVENOUS

## 2024-02-28 MED ORDER — SODIUM CHLORIDE 0.9 % IV SOLN: INTRAVENOUS | Status: DC

## 2024-02-28 SURGICAL SUPPLY — 30 items
BLADE SURG 15 STRL LF DISP TIS (BLADE) ×2 IMPLANT
CHLORAPREP W/TINT 26 (MISCELLANEOUS) ×2 IMPLANT
CLIP APPLIE 11 MED OPEN (CLIP) IMPLANT
DERMABOND ADVANCED .7 DNX12 (GAUZE/BANDAGES/DRESSINGS) ×2 IMPLANT
DRAIN CHANNEL JP 15F RND 3/16 (MISCELLANEOUS) IMPLANT
DRAPE LAPAROTOMY 100X77 ABD (DRAPES) ×2 IMPLANT
DRSG TEGADERM 4X4.75 (GAUZE/BANDAGES/DRESSINGS) IMPLANT
ELECTRODE REM PT RTRN 9FT ADLT (ELECTROSURGICAL) ×2 IMPLANT
EVACUATOR SILICONE 100CC (DRAIN) IMPLANT
GAUZE 4X4 16PLY ~~LOC~~+RFID DBL (SPONGE) IMPLANT
GLOVE BIO SURGEON STRL SZ7 (GLOVE) ×2 IMPLANT
GOWN STRL REUS W/ TWL LRG LVL3 (GOWN DISPOSABLE) ×4 IMPLANT
KIT TURNOVER KIT A (KITS) ×2 IMPLANT
LABEL OR SOLS (LABEL) ×2 IMPLANT
MANIFOLD NEPTUNE II (INSTRUMENTS) ×2 IMPLANT
NDL HYPO 22X1.5 SAFETY MO (MISCELLANEOUS) ×2 IMPLANT
NEEDLE HYPO 22X1.5 SAFETY MO (MISCELLANEOUS) ×2 IMPLANT
NS IRRIG 500ML POUR BTL (IV SOLUTION) ×2 IMPLANT
PACK BASIN MINOR ARMC (MISCELLANEOUS) ×2 IMPLANT
SHEET MEDIUM DRAPE 40X70 STRL (DRAPES) IMPLANT
SPONGE KITTNER 5P (MISCELLANEOUS) IMPLANT
SPONGE T-LAP 18X18 ~~LOC~~+RFID (SPONGE) ×2 IMPLANT
SUT SILK 2 0SH CR/8 30 (SUTURE) IMPLANT
SUT SILK 2-0 30XBRD TIE 12 (SUTURE) IMPLANT
SUT VIC AB 2-0 SH 27XBRD (SUTURE) IMPLANT
SUTURE EHLN 3-0 FS-10 30 BLK (SUTURE) IMPLANT
SUTURE MNCRL 4-0 27XMF (SUTURE) ×2 IMPLANT
SYR 10ML LL (SYRINGE) ×2 IMPLANT
TOWEL OR 17X26 4PK STRL BLUE (TOWEL DISPOSABLE) IMPLANT
TRAP FLUID SMOKE EVACUATOR (MISCELLANEOUS) ×2 IMPLANT

## 2024-02-28 NOTE — Anesthesia Preprocedure Evaluation (Signed)
 Anesthesia Evaluation  Patient identified by MRN, date of birth, ID band Patient awake    Reviewed: Allergy & Precautions, NPO status , Patient's Chart, lab work & pertinent test results  Airway Mallampati: III  TM Distance: >3 FB Neck ROM: Full    Dental  (+) Teeth Intact   Pulmonary neg pulmonary ROS, asthma , former smoker   Pulmonary exam normal        Cardiovascular Exercise Tolerance: Good negative cardio ROS Normal cardiovascular exam Rhythm:Regular Rate:Normal     Neuro/Psych negative neurological ROS  negative psych ROS   GI/Hepatic negative GI ROS, Neg liver ROS,,,  Endo/Other  negative endocrine ROSdiabetes, Type 2, Oral Hypoglycemic Agents    Renal/GU negative Renal ROS  negative genitourinary   Musculoskeletal  (+) Arthritis ,    Abdominal   Peds negative pediatric ROS (+)  Hematology negative hematology ROS (+)   Anesthesia Other Findings Past Medical History: No date: Arthritis No date: Ascending aortic aneurysm     Comment:  a. 06/2018 CTA Chest: 4.1 x 4.0 cm fusiform Asc Ao               aneurysm-unchanged.  Descending thoracic aorta 2.1 cm. No date: Autonomic dysfunction with vaccines 2019: Dyspnea on exertion     Comment:  a. 06/2017 echo: EF 60 to 65%.  No regional wall motion               abnormalities.  Ascending aortic diameter 3.9 cm.  Mild               MR. No date: Endometrial polyp No date: Exercise-induced asthma No date: Gastric erythema No date: History of hypertension no medications since 2020 No date: Lipoma     Comment:  of neck and chest No date: Multiple thyroid  nodules No date: OCD (obsessive compulsive disorder) No date: Pre-diabetes No date: Reflux esophagitis 2015: Seasonal allergies  Past Surgical History: 12/16/2021: colonscopy     Comment:  endoscopy done also 1997: COSMETIC SURGERY     Comment:  breast reduction 10/18/2022: DILATATION &  CURETTAGE/HYSTEROSCOPY WITH MYOSURE; N/A     Comment:  Procedure: DILATATION & CURETTAGE/HYSTEROSCOPY WITH               POLYPECTOMY BY MYOSURE;  Surgeon: Kandyce Sor, MD;                Location: [facility];  Service:               Gynecology;  Laterality: N/A; 01/24/2018: ESOPHAGOGASTRODUODENOSCOPY (EGD) WITH PROPOFOL ; N/A     Comment:  Procedure: ESOPHAGOGASTRODUODENOSCOPY (EGD) WITH               PROPOFOL ;  Surgeon: Janalyn Keene NOVAK, MD;  Location:               [facility];  Service: Endoscopy;  Laterality: N/A; 09/09/2020: ESOPHAGOGASTRODUODENOSCOPY (EGD) WITH PROPOFOL ; N/A     Comment:  Procedure: ESOPHAGOGASTRODUODENOSCOPY (EGD) WITH               PROPOFOL ;  Surgeon: Janalyn Keene NOVAK, MD;  Location:               [facility];  Service: Endoscopy;  Laterality: N/A; 02/17/2021: ESOPHAGOGASTRODUODENOSCOPY (EGD) WITH PROPOFOL ; N/A     Comment:  Procedure: ESOPHAGOGASTRODUODENOSCOPY (EGD) WITH               PROPOFOL ;  Surgeon: Janalyn Keene NOVAK, MD;  Location:  [facility];  Service: Endoscopy;  Laterality: N/A; 2007: lipoma removal     Comment:  back 12/01/1996: REDUCTION MAMMAPLASTY; Bilateral     Reproductive/Obstetrics negative OB ROS                              Anesthesia Physical Anesthesia Plan  ASA: 2  Anesthesia Plan: General   Post-op Pain Management:    Induction: Intravenous  PONV Risk Score and Plan: Propofol  infusion and TIVA  Airway Management Planned: LMA and Oral ETT  Additional Equipment:   Intra-op Plan:   Post-operative Plan: Extubation in OR  Informed Consent: I have reviewed the patients History and Physical, chart, labs and discussed the procedure including the risks, benefits and alternatives for the proposed anesthesia with the patient or authorized representative who has indicated his/her understanding and acceptance.     Dental Advisory Given  Plan  Discussed with: CRNA  Anesthesia Plan Comments:         Anesthesia Quick Evaluation

## 2024-02-28 NOTE — Op Note (Signed)
 PROCEDURES: 1. Excision of deep and large LEft Neck mass posterior triangle 8cm 2. Excision of left forearm  mass 4.3 cm  Pre-operative Diagnosis: Left neck mass and left forearm mass  Post-operative Diagnosis: Same  Surgeon: Laneta FALCON Larose Batres   Anesthesia: General LMA anesthesia  ASA Class: 2   Surgeon: Laneta Luna , MD FACS  Anesthesia: Gen. with endotracheal tube   Findings: Large and Deep left neck mass subfasacial CN XI Nerve idientifed and preserved   Estimated Blood Loss: 10cc         Drains: 15 blake eck         Specimens: soft tissue masses x 2           Complications: none               Condition: stable  Procedure Details  The patient was seen again in the Holding Room. The benefits, complications, treatment options, and expected outcomes were discussed with the patient. The risks of bleeding, infection, recurrence of symptoms, failure to resolve symptoms, nerve injury, any of which could require further surgery were reviewed with the patient.   The patient was taken to Operating Room, identified as Brittany Ochoa and the procedure verified.  A Time Out was held and the above information confirmed.  Prior to the induction of general anesthesia, antibiotic prophylaxis was administered. VTE prophylaxis was in place. General endotracheal anesthesia was then administered and tolerated well. After the induction, the neck was prepped with Chloraprep and draped in the sterile fashion. The patient was positioned in the supine position.  A transverse incision was created in the left neck and electrocautery was used to dissect through subcutaneous tissue.  The sternocleidomastoid muscle was identified and retracted medially we also identified the lateral border of the trapezius muscle. We also identified the trajectory of the accessory nerve, along with its nerve and preserved at all times. I avoided the use of electrocautery. I used metz to dissect the mass from the nerval  tissue. I was far away from the vagus nerve and therefore I did not identify it. Supracervical branches of the plexus were also identified and using peanuts we were able to meticulously dissect the mass from adjacent tissue. This was tedious but I felt confident I avoided any injuries to nerve tissue. THe mass was large, multiple small vessel and lymphatic channels were clipped . THe mass was dissected free and sent for permanent pathology. Hemostasis was excellent and I used vistaseal within the bed of the site. 15 blake drain placed and secured to the skin. WOund closed with multiple 2-0 for the deep layer and 4-0 monocryl for the skin in a subcuticular fashion. Dermabond was applied. Attention was turned to the left arm where the area was prepped and draped in the usual fashion.  The subqwas divided with electrocautery and mass dissected free from adjacent tissue and sent for permanent pathology. Dermal 2-0 vicryls placed and skin closed w 4-0 monocryl Dermabond used to coat the skin Needle and laparotomy count were correct and there were no immediate occasions  Laneta Luna, MD, FACS

## 2024-02-28 NOTE — Transfer of Care (Signed)
 Immediate Anesthesia Transfer of Care Note  Patient: Brittany Ochoa  Procedure(s) Performed: EXCISION, MASS, NECK (Left: Neck) EXCISION MASS UPPER EXTREMITIES (Left: Arm Lower)  Patient Location: PACU  Anesthesia Type:General  Level of Consciousness: drowsy  Airway & Oxygen  Therapy: Patient Spontanous Breathing and Patient connected to face mask oxygen   Post-op Assessment: Report given to RN and Post -op Vital signs reviewed and stable  Post vital signs: Reviewed and stable  Last Vitals:  Vitals Value Taken Time  BP 110/67 02/28/24 10:12  Temp 36.3 C 02/28/24 10:12  Pulse 84 02/28/24 10:15  Resp 20 02/28/24 10:15  SpO2 100 % 02/28/24 10:15  Vitals shown include unfiled device data.  Last Pain:  Vitals:   02/28/24 0657  TempSrc: Temporal  PainSc: 0-No pain         Complications: No notable events documented.

## 2024-02-28 NOTE — OR Nursing (Signed)
 Patient states she would accept blood in an emergency. States she did not want blood from someone who had COVID vaccine but that she would accept it if it was a life saving measure.

## 2024-02-28 NOTE — Interval H&P Note (Signed)
 History and Physical Interval Note:  02/28/2024 7:19 AM  Brittany Ochoa  has presented today for surgery, with the diagnosis of soft tissue masses.  The various methods of treatment have been discussed with the patient and family. After consideration of risks, benefits and other options for treatment, the patient has consented to  Procedure(s): EXCISION, MASS, NECK (Left) EXCISION MASS UPPER EXTREMITIES (Left) as a surgical intervention.  The patient's history has been reviewed, patient examined, no change in status, stable for surgery.  I have reviewed the patient's chart and labs.  Questions were answered to the patient's satisfaction.     Mang Hazelrigg F Allona Gondek

## 2024-02-28 NOTE — Anesthesia Postprocedure Evaluation (Signed)
 Anesthesia Post Note  Patient: Brittany Ochoa  Procedure(s) Performed: EXCISION, MASS, NECK (Left: Neck) EXCISION MASS UPPER EXTREMITIES (Left: Arm Lower)  Patient location during evaluation: PACU Anesthesia Type: General Level of consciousness: awake Pain management: pain level controlled Vital Signs Assessment: post-procedure vital signs reviewed and stable Respiratory status: spontaneous breathing Cardiovascular status: stable Anesthetic complications: no   No notable events documented.   Last Vitals:  Vitals:   02/28/24 1038 02/28/24 1045  BP:  (!) 135/91  Pulse: (!) 102 98  Resp: 16 20  Temp:    SpO2: 92% 95%    Last Pain:  Vitals:   02/28/24 1045  TempSrc:   PainSc: 5                  VAN STAVEREN,[name]

## 2024-02-28 NOTE — Discharge Instructions (Addendum)
 Surgical [facility] Care Surgical drains are placed during surgery. They are used to get rid of the extra fluid that can build up in a wound after surgery. They can also help heal the wound. The kinds of drains include: Active drains. These use suction to pull drainage away from the wound. Drainage flows through a tube to a container outside of the body. With these drains, you need to keep the bulb or the container flat (compressed) at all times, except while being emptied. Flattening the bulb or container creates suction. One of the most common types of active drains is the Jackson-Pratt (JP) drain. Passive drains. These allow fluid to drain using gravity rather than suction. Drainage flows through a tube to a bandage (dressing) or a container outside of the body. These drains do not need to be emptied. One of the most common types of passive drains is the Penrose drain. Right after surgery, drainage is often bright red and a little thicker than water. It may turn yellow or pink over time and become thinner. Your health care provider may remove the drain when the drainage stops or when the amount decreases to 1-2 tbsp (15-30 mL) in a 24-hour period. How to care for your surgical drain Care for your drain as told by your provider. Keep the skin around the drain dry and covered with a dressing at all times. This can help to prevent infection. If the drain is placed at your back, or in any other hard-to-reach area, ask someone to help you change the dressing, empty the drain, and check for infection. Changing the dressing Follow instructions from your provider about how to change your dressing. Change it at least once a day. Change it more often if needed to keep the dressing dry. Make sure you: Gather your supplies. These may include: Tape. Germ-free cleaning solution (sterile saline). Cotton swabs. Split gauze drain sponge: 4 x 4 inches (10 x 10 cm). Gauze square: 4 x 4 inches (10 x 10 cm). Wash your  hands with soap and water for at least 20 seconds before and after you change your dressing. If soap and water are not available, use hand sanitizer. Remove the old dressing. Avoid using scissors to do that. Wash your hands with soap and water again after taking off the old dressing. Use sterile saline to clean the skin around the drain. You may need to use a cotton swab to clean the skin. Place the tube through the slit in a drain sponge. Place the drain sponge so that it covers your wound. Place the gauze square or another drain sponge on top of the drain sponge that is on the wound. Make sure the tube is between those layers. Tape the dressing to your skin. Then, tape the drainage tube to your skin 1-2 inches (2.5-5 cm) below the place where the tube enters your body. Taping keeps the tube from pulling on any stitches (sutures) that you have. Wash your hands with soap and water. Write down the color of your drainage and how often you change your dressing. Emptying the active drain  Make sure you have a measuring cup that you can empty your drainage into. Wash your hands with soap and water for at least 20 seconds before and after you empty your drain. If soap and water are not available, use hand sanitizer. Loosen any pins or clips that hold the tube in place. If your provider tells you to strip the tube to prevent clots and  tube blockages: Hold the tube at the skin with one hand. Use your other hand to pinch the tube with your thumb and first finger. Gently move your fingers down the tube while squeezing very lightly. This clears any drainage, clots, or tissue from the tube. You may need to do this a few times a day to keep the tube clear. Do not pull on the tube. Open the bulb cap or the drain plug. Do not touch the inside of the cap or the bottom of the plug. Turn the device upside down and gently squeeze. Empty all of the drainage into the measuring cup. Compress the bulb or the container.  Replace the cap or the plug. To compress the bulb or the container, squeeze it firmly in the middle while you close the cap or plug the container. Write down the amount of drainage that you have in each 24-hour period. If you have less than 2 tbsp (30 mL) of drainage during the 24 hours, contact your provider. Flush the drainage down the toilet. Wash your hands with soap and water. Checking for infection Check your drain area every day for signs of infection. Check for: Redness, swelling, or pain. Warmth. Pus or a bad smell. Drainage that looks cloudy. Tenderness or pressure at the spot where the drain leaves your body. Contact a health care provider if: You have any signs of infection around your drain area. You have a fever or chills. The amount of drainage that you have stops all of a sudden, or the drainage increases rather than decreases. Your tube falls out. Your active drain does not stay compressed after you empty it. The tube gets detached from the bulb or container. This information is not intended to replace advice given to you by your health care provider. Make sure you discuss any questions you have with your health care provider. Document Revised: 11/18/2021 Document Reviewed: 11/18/2021 Elsevier Patient Education  2024 [employer]    Surgical Drain Record  Empty your surgical drain as told by your health care provider. Use this form to write down the amount of fluid that has collected in the drainage container. Bring this form with you to your follow-up visits. Surgical drain #1 location: ___________________ Date __________ Time __________ Amount __________ Date __________ Time __________ Amount __________ Date __________ Time __________ Amount __________ Date __________ Time __________ Amount __________ Date __________ Time __________ Amount __________ Date __________ Time __________ Amount __________ Date __________ Time __________ Amount __________ Date  __________ Time __________ Amount __________ Date __________ Time __________ Amount __________ Date __________ Time __________ Amount __________ Date __________ Time __________ Amount __________ Date __________ Time __________ Amount __________ Date __________ Time __________ Amount __________ Date __________ Time __________ Amount __________ Surgical drain #2 location: ___________________ Date __________ Time __________ Amount __________ Date __________ Time __________ Amount __________ Date __________ Time __________ Amount __________ Date __________ Time __________ Amount __________ Date __________ Time __________ Amount __________ Date __________ Time __________ Amount __________ Date __________ Time __________ Amount __________ Date __________ Time __________ Amount __________ Date __________ Time __________ Amount __________ Date __________ Time __________ Amount __________ Date __________ Time __________ Amount __________ Date __________ Time __________ Amount __________ Date __________ Time __________ Amount __________ Date __________ Time __________ Amount __________ This information is not intended to replace advice given to you by your health care provider. Make sure you discuss any questions you have with your health care provider. Document Revised: 05/31/2020 Document Reviewed: 05/31/2020 Elsevier Patient Education  2024 [employer].

## 2024-02-28 NOTE — Anesthesia Procedure Notes (Signed)
 Procedure Name: LMA Insertion Date/Time: 02/28/2024 7:43 AM  Performed by: Jaylene Nest, CRNAPre-anesthesia Checklist: Patient identified, Patient being monitored, Timeout performed, Emergency Drugs available and Suction available Patient Re-evaluated:Patient Re-evaluated prior to induction Oxygen  Delivery Method: Circle system utilized Preoxygenation: Pre-oxygenation with 100% oxygen  Induction Type: IV induction Ventilation: Mask ventilation without difficulty LMA: LMA inserted LMA Size: 4.0 Tube type: Oral Number of attempts: 1 Placement Confirmation: positive ETCO2 and breath sounds checked- equal and bilateral Tube secured with: Tape Dental Injury: Teeth and Oropharynx as per pre-operative assessment

## 2024-02-29 ENCOUNTER — Encounter: Payer: Self-pay | Admitting: Surgery

## 2024-03-03 LAB — SURGICAL PATHOLOGY

## 2024-03-05 ENCOUNTER — Encounter: Payer: Self-pay | Admitting: Surgery

## 2024-03-05 ENCOUNTER — Ambulatory Visit (INDEPENDENT_AMBULATORY_CARE_PROVIDER_SITE_OTHER): Admitting: Surgery

## 2024-03-05 VITALS — BP 131/86 | HR 82 | Temp 98.6°F | Ht 64.0 in | Wt 173.8 lb

## 2024-03-05 DIAGNOSIS — Z09 Encounter for follow-up examination after completed treatment for conditions other than malignant neoplasm: Secondary | ICD-10-CM

## 2024-03-05 DIAGNOSIS — D170 Benign lipomatous neoplasm of skin and subcutaneous tissue of head, face and neck: Secondary | ICD-10-CM

## 2024-03-05 NOTE — Patient Instructions (Signed)
 Removing Skin Growths or Abnormality (Excision of Skin Lesions): What to Know After After surgery to remove skin growths or another abnormality, you may have soreness or mild pain. You may also have some redness and swelling. Follow these instructions at home: Caring for your wound  Take care of your wound as told. Make sure you: Wash your hands with soap and water for at least 20 seconds before and after you change your bandage. If you can't use soap and water, use hand sanitizer. Change your bandage. Leave stitches or skin glue alone. Leave tape strips alone unless you're told to take them off. You may trim the edges of the tape strips if they curl up. Check the wound area every day for signs of infection. Watch for: Redness, swelling, or pain. Fluid or blood. Warmth. Pus or a bad smell. Keep the area clean, dry, and protected for at least 48 hours. If you bleed, put gentle but firm pressure on the area using a folded towel for 20 minutes. General instructions Take your medicines only as told. You'll be told what to do to reduce scarring. Scarring should lessen over time. Avoid exposing the area to the sun until the wound has healed. Use sunscreen to protect the area from the sun after it has healed. Avoid heavy exercises and activities until the stitches are removed or the area heals. Do not take baths, swim, or use a hot tub until you're told it's OK. Ask if you can shower. Ask what things are safe for you to do at home. Ask when you can go back to work or school. Contact a health care provider if: You have any signs of infection, including redness, swelling, or warmth around your wound. You have a fever. Your stitches, skin glue, or tape strips loosen or come off sooner than expected. Your wound opens up. Get help right away if: You have bleeding that doesn't stop even after you put a bandage or pressure. This information is not intended to replace advice given to you by your  health care provider. Make sure you discuss any questions you have with your health care provider. Document Revised: 07/05/2023 Document Reviewed: 06/07/2023 Elsevier Patient Education  2025 [employer].

## 2024-03-05 NOTE — Progress Notes (Signed)
 Sinclair Is 6 days out from excision of soft tissue mass in the neck.  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  She does have some expected incisional pain.  He does have a little bit of difficulty raising the left arm but is able to do so.  She does report some occasional tingling and numbness on her left hand. Drain is less than 20 cc a day.  PE NAD Healing well without evidence of infection or hematomas.  Drain removed without issues. He is neurologically intact on her left hand.  A/P doing well after neck mass excision.  Discussed with her that we do have expected soreness given the size of the mass and some of the numbness might be related to inflammatory response and potential traction from the surgery.  I do not see evidence of nerve injury.  Will be happy to see her in a couple weeks

## 2024-03-10 ENCOUNTER — Telehealth: Payer: Self-pay | Admitting: Surgery

## 2024-03-10 NOTE — Telephone Encounter (Signed)
 Pt said that she needs you to give her a call. Pt said that she needs  her papers for [employer] filled out and that she has gotten 2 letters telling her that they need this paper wotk. She wants you to also know that [place] changed her dates from Dec 1 to Dec 8. To go back to work, but needs you to make sure you call her personally.Pt # is [phone]

## 2024-03-11 ENCOUNTER — Encounter: Payer: Self-pay | Admitting: Gastroenterology

## 2024-03-11 ENCOUNTER — Ambulatory Visit (INDEPENDENT_AMBULATORY_CARE_PROVIDER_SITE_OTHER): Admitting: Gastroenterology

## 2024-03-11 ENCOUNTER — Other Ambulatory Visit: Payer: Self-pay | Admitting: Endocrinology

## 2024-03-11 ENCOUNTER — Telehealth: Payer: Self-pay | Admitting: *Deleted

## 2024-03-11 VITALS — BP 130/80 | HR 71 | Ht 64.0 in | Wt 173.2 lb

## 2024-03-11 DIAGNOSIS — R1319 Other dysphagia: Secondary | ICD-10-CM

## 2024-03-11 DIAGNOSIS — R194 Change in bowel habit: Secondary | ICD-10-CM | POA: Diagnosis not present

## 2024-03-11 DIAGNOSIS — I10 Essential (primary) hypertension: Secondary | ICD-10-CM | POA: Diagnosis not present

## 2024-03-11 DIAGNOSIS — E049 Nontoxic goiter, unspecified: Secondary | ICD-10-CM | POA: Diagnosis not present

## 2024-03-11 DIAGNOSIS — R1084 Generalized abdominal pain: Secondary | ICD-10-CM

## 2024-03-11 DIAGNOSIS — Z800 Family history of malignant neoplasm of digestive organs: Secondary | ICD-10-CM

## 2024-03-11 DIAGNOSIS — R7301 Impaired fasting glucose: Secondary | ICD-10-CM | POA: Diagnosis not present

## 2024-03-11 DIAGNOSIS — E785 Hyperlipidemia, unspecified: Secondary | ICD-10-CM | POA: Diagnosis not present

## 2024-03-11 DIAGNOSIS — K31A0 Gastric intestinal metaplasia, unspecified: Secondary | ICD-10-CM

## 2024-03-11 NOTE — Telephone Encounter (Signed)
Faxed FMLA to [employer] at [phone] °

## 2024-03-11 NOTE — Patient Instructions (Signed)
 You have been scheduled for a Barium Esophogram at [facility] (1st floor of the hospital) on Friday, 03/28/24 at 11:00 am. Please arrive 15 minutes prior to your appointment for registration. Make certain not to have anything to eat or drink 3 hours prior to your test. If you need to reschedule for any reason, please contact radiology at [phone] to do so. __________________________________________________________________ A barium swallow is an examination that concentrates on views of the esophagus. This tends to be a double contrast exam (barium and two liquids which, when combined, create a gas to distend the wall of the oesophagus) or single contrast (non-ionic iodine  based). The study is usually tailored to your symptoms so a good history is essential. Attention is paid during the study to the form, structure and configuration of the esophagus, looking for functional disorders (such as aspiration, dysphagia, achalasia, motility and reflux) EXAMINATION You may be asked to change into a gown, depending on the type of swallow being performed. A radiologist and radiographer will perform the procedure. The radiologist will advise you of the type of contrast selected for your procedure and direct you during the exam. You will be asked to stand, sit or lie in several different positions and to hold a small amount of fluid in your mouth before being asked to swallow while the imaging is performed .In some instances you may be asked to swallow barium coated marshmallows to assess the motility of a solid food bolus. The exam can be recorded as a digital or video fluoroscopy procedure. POST PROCEDURE It will take 1-2 days for the barium to pass through your system. To facilitate this, it is important, unless otherwise directed, to increase your fluids for the next 24-48hrs and to resume your normal diet.  This test typically takes about 30 minutes to  perform. __________________________________________________________________________________  Thank you for entrusting me with your care and for choosing [facility], Dr. Victory Brand III   _______________________________________________________  If your blood pressure at your visit was 140/90 or greater, please contact your primary care physician to follow up on this.  _______________________________________________________  If you are age 56 or older, your body mass index should be between 23-30. Your Body mass index is 29.73 kg/m. If this is out of the aforementioned range listed, please consider follow up with your Primary Care Provider.  If you are age 56 or younger, your body mass index should be between 19-25. Your Body mass index is 29.73 kg/m. If this is out of the aformentioned range listed, please consider follow up with your Primary Care Provider.   ________________________________________________________  The [facility] providers would like to encourage you to use MYCHART to communicate with providers for non-urgent requests or questions.  Due to long hold times on the telephone, sending your provider a message by [facility] may be a faster and more efficient way to get a response.  Please allow 48 business hours for a response.  Please remember that this is for non-urgent requests.  _______________________________________________________  [facility] is using a team-based approach to care.  Your team is made up of your doctor and two to three APPS. Our APPS (Nurse Practitioners and Physician Assistants) work with your physician to ensure care continuity for you. They are fully qualified to address your health concerns and develop a treatment plan. They communicate directly with your gastroenterologist to care for you. Seeing the Advanced Practice Practitioners on your physician's team can help you by facilitating care more promptly, often allowing for  earlier  appointments, access to diagnostic testing, procedures, and other specialty referrals.

## 2024-03-11 NOTE — Progress Notes (Signed)
 [facility] Consult Note:  History: Brittany Ochoa 03/11/2024  Referring provider: Vicci Duwaine SQUIBB, DO  Reason for consult/chief complaint: Abdominal Pain and Gastroesophageal Reflux (Possible stricture)   Subjective  Prior history:  Prior workup at [facility] Transferred care to Dr. 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 Neg Bx for EoE Labs that day unrevealing EGD and colonoscopy September 2023 unrevealing except grade A esophagitis.  Normal TI, diminutive TA, biopsies negative for microscopic colitis. Biopsies negative for celiac sprue, H. pylori or GIM.   Dr. Federico recommended a repeat surveillance EGD in 3 years due to the prior history of GIM and family history of gastric cancer.(Father, GM, GGM) Patient contacted our office months ago requesting an another opinion/transfer of care for her symptoms.   Discussed the use of AI scribe software for clinical note transcription with the patient, who gave verbal consent to proceed.  History of Present Illness Brittany Ochoa is a 58 year old female who presents with concerns about swallowing difficulties and potential esophageal stricture. She was referred by Dr. Federico for evaluation of her gastrointestinal symptoms.  Dysphagia and chest pain.  Initially reported this has been going on for about a year, then realized he probably had been longer when I brought to her attention that she had described dysphagia at the office visit with Dr. Federico. - Difficulty swallowing certain foods, particularly rice and bread, which feel stuck and require water to pass.  No liquid dysphagia - Sensation of food impaction coincides with location of intermittent nonexertional chest pain - Chest pain initially suspected to be cardiac in origin, but cardiac causes were ruled out by cardiology (by report) - Concern for possible  esophageal stricture or motility disorder.  She works with a friend (my patient) who has EOE with stricture and requires repeat upper endoscopies.  Gastroesophageal reflux symptoms - Was told she has a history of silent reflux (?  Based on EGD findings), improved with dietary modifications - Occasional use of Tums for relief, especially after consuming triggering foods such as chocolate  Gastric intestinal metaplasia and stomach pain - History of gastric intestinal metaplasia diagnosed via endoscopy at [facility] - Occasional stomach pain - Family history of stomach cancer in grandfather, grandmother, and great-grandfather - Brittany Ochoa has significant concern regarding her risk for gastric malignancy and what she can do to change that  Abnormal bowel habits and stool findings - Onset of additional abnormal bowel habits following colonoscopy prep two years ago , With passage of 'white, rice-like things' in stool for several months, later passage of material described as 'ropeworm'. - Stool sample sent to laboratory (ordered by primary care) was reportedly negative for parasites.  She says she has some specimen cups at home given to her by primary care so she can bring another specimen if similar happens again. - Persistent bloating, gas, and passage of unusual stool contents since a trip to [place] in 2019.  This still makes her wonder if she could have developed some kind of parasite or other infection from that trip.     ROS:  Review of Systems  Constitutional:  Negative for appetite change and unexpected weight change.  HENT:  Negative for mouth sores and voice change.   Eyes:  Negative for pain and redness.  Respiratory:  Negative for cough and shortness of breath.   Cardiovascular:  Negative for chest pain and palpitations.  Genitourinary:  Negative for dysuria and hematuria.  Musculoskeletal:  Negative for arthralgias and myalgias.  Skin:  Negative for pallor and rash.  Neurological:   Negative for weakness and headaches.  Hematological:  Negative for adenopathy.     Past Medical History: Past Medical History:  Diagnosis Date   Arthritis    Ascending aortic aneurysm    a. 06/2018 CTA Chest: 4.1 x 4.0 cm fusiform Asc Ao aneurysm-unchanged.  Descending thoracic aorta 2.1 cm.   Autonomic dysfunction with vaccines    Dyspnea on exertion 2019   a. 06/2017 echo: EF 60 to 65%.  No regional wall motion abnormalities.  Ascending aortic diameter 3.9 cm.  Mild MR.   Endometrial polyp    Exercise-induced asthma    Gastric erythema    History of hypertension no medications since 2020    Lipoma    of neck and chest   Multiple thyroid  nodules    OCD (obsessive compulsive disorder)    Pre-diabetes    Reflux esophagitis    Seasonal allergies 2015     Past Surgical History: Past Surgical History:  Procedure Laterality Date   colonscopy  12/16/2021   endoscopy done also   COSMETIC SURGERY  1997   breast reduction   DILATATION & CURETTAGE/HYSTEROSCOPY WITH MYOSURE N/A 10/18/2022   Procedure: DILATATION & CURETTAGE/HYSTEROSCOPY WITH POLYPECTOMY BY MYOSURE;  Surgeon: Kandyce Sor, MD;  Location[facility];  Service: Gynecology;  Laterality: N/A;   ESOPHAGOGASTRODUODENOSCOPY (EGD) WITH PROPOFOL  N/A 01/24/2018   Procedure: ESOPHAGOGASTRODUODENOSCOPY (EGD) WITH PROPOFOL ;  Surgeon: Janalyn Keene NOVAK, MD;  Location: [facility];  Service: Endoscopy;  Laterality: N/A;   ESOPHAGOGASTRODUODENOSCOPY (EGD) WITH PROPOFOL  N/A 09/09/2020   Procedure: ESOPHAGOGASTRODUODENOSCOPY (EGD) WITH PROPOFOL ;  Surgeon: Janalyn Keene NOVAK, MD;  Location: [facility];  Service: Endoscopy;  Laterality: N/A;   ESOPHAGOGASTRODUODENOSCOPY (EGD) WITH PROPOFOL  N/A 02/17/2021   Procedure: ESOPHAGOGASTRODUODENOSCOPY (EGD) WITH PROPOFOL ;  Surgeon: Janalyn Keene NOVAK, MD;  Location: [facility];  Service: Endoscopy;  Laterality: N/A;   EXCISION MASS NECK Left 02/28/2024    Procedure: EXCISION, MASS, NECK;  Surgeon: Jordis Laneta FALCON, MD;  Location: [facility];  Service: General;  Laterality: Left;   EXCISION MASS UPPER EXTREMETIES Left 02/28/2024   Procedure: EXCISION MASS UPPER EXTREMITIES;  Surgeon: Jordis Laneta FALCON, MD;  Location: [facility];  Service: General;  Laterality: Left;   lipoma removal  2007   back   REDUCTION MAMMAPLASTY Bilateral 12/01/1996     Family History: Family History  Problem Relation Age of Onset   Diabetes Mother    Arthritis Mother    Obesity Mother    Miscarriages / Stillbirths Mother    Diabetes Father    Heart disease Father    Heart attack Father    Stomach cancer Father 22   Cancer Father    Asthma Brother    Anxiety disorder Brother    ADD / ADHD Brother    Osteoporosis Maternal Grandmother    Arthritis Maternal Grandmother    Varicose Veins Maternal Grandmother    Heart disease Paternal Grandmother    Stomach cancer Paternal Grandmother    Breast cancer Paternal Grandmother    Cancer Paternal Grandmother    Cancer Paternal Grandfather        spleen   Heart disease Paternal Grandfather    Alcohol abuse Maternal Uncle    Cancer Paternal Aunt        rare blood cancer; non genetic   Diabetes Paternal Aunt    Cancer  Paternal Uncle        rare blood cancer; non genetic   Cancer Paternal Aunt    Alcohol abuse Maternal Uncle     Social History: Social History   Socioeconomic History   Marital status: Single    Spouse name: Not on file   Number of children: Not on file   Years of education: Not on file   Highest education level: Bachelor's degree (e.g., BA, AB, BS)  Occupational History   Not on file  Tobacco Use   Smoking status: Former    Current packs/day: 0.00    Average packs/day: 2.0 packs/day for 25.0 years (50.0 ttl pk-yrs)    Types: Cigarettes    Start date: 04/18/1979    Quit date: 04/17/2004    Years since quitting: 19.9    Passive exposure: Past   Smokeless tobacco: Never  Vaping Use   Vaping  status: Never Used  Substance and Sexual Activity   Alcohol use: Not Currently    Comment: Extremely rare   Drug use: No    Comment: past only   Sexual activity: Not Currently  Other Topics Concern   Not on file  Social History Narrative   Not on file   Social Drivers of Health   Financial Resource Strain: Medium Risk (11/23/2023)   Overall Financial Resource Strain (CARDIA)    Difficulty of Paying Living Expenses: Somewhat hard  Food Insecurity: No Food Insecurity (11/23/2023)   Hunger Vital Sign    Worried About Running Out of Food in the Last Year: Never true    Ran Out of Food in the Last Year: Never true  Transportation Needs: No Transportation Needs (11/23/2023)   PRAPARE - Administrator, Civil Service (Medical): No    Lack of Transportation (Non-Medical): No  Physical Activity: Sufficiently Active (11/23/2023)   Exercise Vital Sign    Days of Exercise per Week: 4 days    Minutes of Exercise per Session: 50 min  Stress: Stress Concern Present (11/23/2023)   [employer] of Occupational Health - Occupational Stress Questionnaire    Feeling of Stress: To some extent  Social Connections: Unknown (11/23/2023)   Social Connection and Isolation Panel    Frequency of Communication with Friends and Family: More than three times a week    Frequency of Social Gatherings with Friends and Family: Once a week    Attends Religious Services: More than 4 times per year    Active Member of [employer] or Organizations: No    Attends Banker Meetings: Not on file    Marital Status: Patient declined    Allergies: Allergies  Allergen Reactions   Codeine Anaphylaxis   Pholcodine     Other reaction(s): wheezing cannot ake due to aneurysm   Dilaudid  [Hydromorphone ] Other (See Comments)   Medical Provider Ez Flu Shot [Influenza Vac Split Quad] Other (See Comments)    Severe myalgias and bone pain Any vaccine sets off dysnomia and wind up in hospital   Quinolones      Aortic enlargement    Tirzepatide      Other Reaction(s): Hypertension/Headache sets off dysnomina    Outpatient Meds: Current Outpatient Medications  Medication Sig Dispense Refill   estradiol  (ESTRACE ) 0.1 MG/GM vaginal cream Discard applicator. Apply pea sized amount to tip of finger to urethra before bed. Wash hands well after application. Use Monday, Wednesday and Friday. 42.5 g 12   metFORMIN  (GLUCOPHAGE -XR) 500 MG 24 hr tablet Take 1 tablet (500 mg  total) by mouth 2 (two) times daily. 60 tablet 6   No current facility-administered medications for this visit.      ___________________________________________________________________ Objective   Exam:  BP 130/80   Pulse 71   Ht 5' 4 (1.626 m)   Wt 173 lb 3.2 oz (78.6 kg)   LMP 06/01/2019   BMI 29.73 kg/m  Wt Readings from Last 3 Encounters:  03/11/24 173 lb 3.2 oz (78.6 kg)  03/05/24 173 lb 12.8 oz (78.8 kg)  02/28/24 169 lb (76.7 kg)    General: Well-appearing, normal muscle mass Eyes: sclera anicteric, no redness ENT: oral mucosa moist without lesions, no cervical or supraclavicular lymphadenopathy CV: Regular without appreciable murmur, no JVD, no peripheral edema Resp: clear to auscultation bilaterally, normal RR and effort noted GI: soft, no focal tenderness, with active bowel sounds. No guarding or palpable organomegaly noted. Skin; warm and dry, no rash or jaundice noted Neuro: awake, alert and oriented x 3. Normal gross motor function and fluent speech   Labs: Prior data as noted above and reviewed in chart   Encounter Diagnoses  Name Primary?   Esophageal dysphagia Yes   Gastric intestinal metaplasia    Family history of gastric cancer    Altered bowel habits    Generalized abdominal pain     Assessment & Plan Dysphagia without Hx of stricture, and thus suspected esophageal motility disorder.  It occurs intermittently with food like rice or bread, not with liquids.  Mild reflux changes on last  EGD, but she does not seem to have regular symptoms.  Nevertheless, could be some reflux related dysmotility.  Less likely achalasia Intermittent dysphagia with sensation of food impaction, likely esophageal motility disorder. No stricture or significant reflux symptoms. Possible esophageal spasm or achalasia. - Ordered barium esophagram to assess motility disorder and rule out findings that might suggest achalasia.  Gastric intestinal metaplasia with increased familial risk of gastric cancer Gastric intestinal metaplasia in antrum, no dysplasia. Increased familial gastric cancer risk. Most cases do not progress to cancer. Periodic surveillance recommended. - Continue periodic endoscopic surveillance every 3 years.  Reassurance given. No clear data on dietary guidelines for risk reduction, though I generally recommend patients try to eliminate or at least reduce the intake of highly processed foods and charred food.    33 minutes were spent on this encounter, including in depth chart review, independent review of results as outlined above, communicating results with the patient directly, face-to-face time with the patient, coordinating care, ordering studies and medications as appropriate, and documentation.  (21 minutes on the abridge timer)   Brittany Ochoa  CC: Referring provider noted above

## 2024-03-12 ENCOUNTER — Other Ambulatory Visit: Payer: Self-pay | Admitting: Endocrinology

## 2024-03-12 DIAGNOSIS — I10 Essential (primary) hypertension: Secondary | ICD-10-CM

## 2024-03-18 ENCOUNTER — Ambulatory Visit
Admission: RE | Admit: 2024-03-18 | Discharge: 2024-03-18 | Disposition: A | Source: Ambulatory Visit | Attending: Endocrinology | Admitting: Endocrinology

## 2024-03-18 DIAGNOSIS — E042 Nontoxic multinodular goiter: Secondary | ICD-10-CM | POA: Diagnosis not present

## 2024-03-18 DIAGNOSIS — E049 Nontoxic goiter, unspecified: Secondary | ICD-10-CM

## 2024-03-19 ENCOUNTER — Ambulatory Visit: Admitting: Surgery

## 2024-03-19 ENCOUNTER — Encounter: Payer: Self-pay | Admitting: Surgery

## 2024-03-19 VITALS — BP 135/88 | HR 76 | Ht 64.0 in | Wt 173.0 lb

## 2024-03-19 DIAGNOSIS — Z09 Encounter for follow-up examination after completed treatment for conditions other than malignant neoplasm: Secondary | ICD-10-CM

## 2024-03-19 DIAGNOSIS — D1722 Benign lipomatous neoplasm of skin and subcutaneous tissue of left arm: Secondary | ICD-10-CM

## 2024-03-19 NOTE — Progress Notes (Signed)
 Brittany Ochoa Is 3 weeks out from excision of soft tissue mass in the neck.  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  She does have some expected incisional pain that is significantly better.   Drain out, raising arms w/o difficutly   PE NAD Healing well without evidence of infection or hematomas. SHe is neurologically intact on her left hand.   A/P doing well after neck mass excision.  Continues to improve .I do not see evidence of nerve injury or complications.  RTC prn

## 2024-03-19 NOTE — Patient Instructions (Signed)
Please call the office if you have any questions or concerns. 

## 2024-03-21 ENCOUNTER — Other Ambulatory Visit

## 2024-03-25 ENCOUNTER — Encounter: Attending: Psychology | Admitting: Psychology

## 2024-03-25 DIAGNOSIS — R419 Unspecified symptoms and signs involving cognitive functions and awareness: Secondary | ICD-10-CM | POA: Insufficient documentation

## 2024-03-25 DIAGNOSIS — F419 Anxiety disorder, unspecified: Secondary | ICD-10-CM | POA: Diagnosis present

## 2024-03-25 DIAGNOSIS — F902 Attention-deficit hyperactivity disorder, combined type: Secondary | ICD-10-CM | POA: Diagnosis present

## 2024-03-25 DIAGNOSIS — R4189 Other symptoms and signs involving cognitive functions and awareness: Secondary | ICD-10-CM | POA: Insufficient documentation

## 2024-03-25 NOTE — Progress Notes (Signed)
 "  NEUROPSYCHOLOGICAL EVALUATION [facility]  Physical Medicine and Rehabilitation     Patient: Brittany Ochoa  DOB: March 05, 1966  Age: 58 y.o. Sex: female  Race/Ethnicity: White or Caucasian   Referring Provider: Vicci Duwaine SQUIBB, DO Provider / Neuropsychologist: Evalene DOROTHA Riff, PsyD  Date of Service: 03/25/2024 Start: 3 PM End: 5 PM Location:  [place] Physical Medicine & Rehabilitation Department [address], [address], [place], [place] [address] Billing Code/Service: [phone] (1 Unit), [phone] (1 Unit); 1 hour and 15 minutes spent in face-to-face clinical interview and remaining 45 minutes was spent in record review, documentation, and testing protocol construction.   Individuals Present: The patient was seen by the provider, in-person, in the provider's outpatient office. The patient was seen unaccompanied.   PATIENT CONSENT AND CONFIDENTIALITY The patient's understanding of the reason for referral was intact. Discussed limits of confidentiality including, but not limited to, posting of final evaluation report in the patient's electronic medical record for both the patient and for the referring provider and appropriate medical professionals. Patient was given the opportunity to have their questions answered. The neuropsychological evaluation process was discussed with the patient and they consented to proceed with the evaluation.  Consent for Evaluation and Treatment: Signed: Yes Explanation of Privacy Policies: Signed: Yes Discussion of Confidentiality Limits: Yes  REASON FOR REFERRAL & RECORD REVIEW The patient is a 58 year old right-handed woman referred for neuropsychological evaluation by her primary care provider, Dr. Vicci, due to concerns for cognitive decline. Per referring provider notes (12/12/23) the patient endorsed difficulties with forgetfulness, expressive and receptive speech, and other subjective cognitive  changes relative to her baseline. Difficulties with fatigue have been present since March 2025. Neurological exam was non-focal. Lab test for Alzheimer's related bio-markers (ATN) were negative. MRI of the head (with and without contrast) completed on 12/19/23 showed, per records, detected a suspected meningioma (right frontoparietal convexity and high posterior right frontal lobe), no mass effect, no parenchymal edema; small chronic infarct (right cerebellar hemisphere), normal cerebral volume, no cortical encephalomalacia, no significant white matter disease, no indication of acute infarct. Per visit notes from neurosurgery on 12/28/23 (Dr. Dino),  indicated the lesions are benign and surgery was not indicated, and not suspected to be the cause of memory difficulties. Plan for monitoring via 1-year follow up imaging was established.   Upon interview, the patient expressed concerns for cognitive decline consistent with those noted by the referring provider. She expressed understanding for the reason of the referral, and was interested in proceeding with the evaluation to help determine the nature of her cognitive symptoms.   HISTORY OF PRESENTING CONCERNS:  Premorbid Cognitive Functioning: The patient described indications suggestive of attention-deficit/hyperactivity disorder. Follow-up interviewing is planned for the feedback visit to investigate this further due to time constraints. Given importance of assessing for change relative to baseline, the follow-up is clinically indicated.   Cognitive Symptom Onset & Course: The patient describes indications of ADHD dating to childhood have persisted throughout her life. Subjective cognitive symptoms which are new versus exacerbations of premorbid/long-standing difficulties are labeled accordingly.   Initial onset of cognitive declines relatively general accompanied by significant difficulties with anxiety that began around 3 years ago. She indicated this was  around time of menopause and suspects it is related. Symptoms involved feeling cognitively foggy, head feels heavy, I can't think, recall things, don't remember peoples names, struggles with word finding, struggles to maintain conversation, and is more easily distracted.  May 2025 there was an episode of acute mental status change while out of town in [place] . She described disorientation and confusion, communication difficulties, and changes were observable to others. She indicated she felt very foggy back then and struggled to recall timeline confidently. She indicated she suspects symptoms began fairly suddenly. She is uncertain how long it lasted. She noted that she was found to have had a UTI, and that she may have been dehydrated during the episode.  She indicated she continued to struggle for about one to two months following the AMS episode, but that she eventually returned to her pre-trip baseline. She described feeling scared and uncertain about whether cognitive symptoms reflected an undiagnosed cause.  She reported some variability in symptom severity; several weeks good and several weeks feeling off.  Notably, she reported an improvement in cognition and feeling like she was at pre-menopause historical baseline (like I was in my 27s) for a period leading up to a back surgery several weeks ago. She indicated she felt she was doing better at work as well. Since the surgery, she indicated she felt back to pre-[place]  post-menopause baseline.   Subjective Cognitive Complaints (current):  Memory: Endorsed difficulties in short term memory. Struggles to remember conversations. Increased reliance on written reminders and other external memory aids.  Increased need for writing things down, utilizing calendar. Remains intact with instrumental activities of daily living (remembering medications, making it to appointments, etc.) Processing Speed: Endorsed declines in thinking/processing speed.  Feels less witty.  Attention & Concentration: Endorsed declines in attention and concentration, a worsening from premorbid (lifelong) difficulties. Language: Endorsed difficulties in word finding. It may take her longer to understand others at times (processing vs attention vs language?). Becomes frustrated with communication difficulties.  Visual-Spatial: No major changes in visual-spatial abilities. (Historically an area of strength) Executive Functioning: Described worsening of premorbid (lifelong) difficulties in executive functioning; attentional control, inhibition/behavioral control (worsening of premorbid), emotion regulation. Social cognition is intact.   Motor/Sensory Complaints:  Sensory changes: Hearing and vision grossly intact/adequate for testing.  Hearing declined slightly over last year or two. Changes in smell persist since COVID in 2020.  Balance/coordination difficulties: Good/no problems.  Frequent instances of dizziness/vertigo: Occasional (1x/week) dizziness and lightheadedness.  Other motor difficulties: No tremor or other notable motor s/sx.   Emotional and Behavioral Functioning:  The patient has a significant history of trauma starting with experiences in her home growing up. No clear indications of active PTSD symptomology at present. Saw a therapist in 2008 for around 1 year; discontinued as it was not felt to be helpful.  Depression Symptomatology: History of recurrent depressive episodes. Sub-clinical symptoms at present; occasional, but not frequent or >50% days, feeling down/blue. Some feelings of loneliness. Denied any anhedonia. episodes lasting 2 weeks or more occur approximately twice per year.  Last episode was in the spring.  No current or past suicidal ideation.  She indicated she generally feels her self is a happy person. Fatigue is significant. There are some possible indications of seasonal pattern.  Anxiety Symptomatology: Marked difficulties with anxiety  with onset ~3 years ago. Also experienced additional stressors and losses around that time. Has had panic attacks. Has anxiety and worry related to being single at this phase in life and not having someone around to help if in trouble. She described feeling that, over the course of years, she has had repeated setbacks/major life events which destabilized things.   Other Symptomatology:  Hx of Experiencing intrusive images, more akin  to OCD intrusions rather than content related to prior traumas. These intrusion experiences occurred 10+ years ago and have remitted. Some compulsive type behaviors with cleaning, but not clearly at levels impacting functioning and not attached to neutralizing intrusions/obsessions (which are absent). Time allocated to cleaning is not clearly excessive. No Dx OCD.  No hallucinations. No in-patient psychiatric admissions. No current psychotherapy.  Some anxiety/rumination that has some features of paranoia in the past, although not of a quality suggestive of frank psychosis. Possible interactions with trauma Hx.  She described feeling less in control of her emotions/having difficulty with emotion regulations.   Reportedly told she was manic depressive in the past, although this was within context of significant distress during divorce. No clear indications of manic episode per interview.  Overall mental health reported to be worse now than in the past.  Sleep: 9-10 hours per night. Previously only needed 6-8 hours. Change has been w/in the last year. Generally feels rested in the morning. Denied problems with onset, or frequent struggles with maintenance. No OSA or clear indications. Caffeine: No excessive intake.   Alcohol Use: No Tobacco Use: No (remote) Recreational Substance Use: No  Academic/Vocational History:  Completed 16 years of education. Bachelors degree. Healthcare administration. Was considering pursuing PsyD, but plan interrupted by pandemic. 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 Has history of exposure to cobalt machine.   Psychosocial: Marital Status: Divorced. Children/Grandchildren: None   Living Situation: Alone.    Daily Activities/Hobbies: Clean, I like pilates, I like to go hiking.   Level of Functional Independence: The patient is intact with basic activities of daily living.   Finances: Intact   Shopping / Meal Preparation: Intact     Household Maintenance / Chores: Warden/ranger / Future Obligations: Intact     Medication Management: Intact       Driving: Intact      Medical History/Record Review: Per records and patient report, History of traumatic brain injury/concussion: LOC around age 72 or 48, unknown duration.    History of stroke: No  History of heart attack: No  History of cancer/chemotherapy: Radiation therapist. Worked for ~2 years on a cobalt machine. No chemo.  History of seizure activity: No Symptoms of chronic pain: mild chronic pain  Experience of frequent headaches/migraines: No   Other: Patient reported significant autonomic dysfunction that occurred when receiving flue vaccine, and which stopped after discontinuing (late 2010s?). She described alarmingly elevated BP that required emergent treatment. No issues since stopping yearly vaccines (had been getting them for work).  Imaging/Lab Results: (see presenting concerns/record review section above).  Past Medical History:  Diagnosis Date   Allergy 2015   seasonal   Arthritis    Ascending aortic aneurysm    a. 06/2018 CTA Chest: 4.1 x 4.0 cm fusiform Asc Ao aneurysm-unchanged.  Descending thoracic aorta 2.1 cm.   Autonomic dysfunction with vaccines    Dyspnea on exertion 2019   a. 06/2017 echo: EF 60 to 65%.  No regional wall motion abnormalities.  Ascending aortic diameter 3.9 cm.  Mild MR.   Endometrial polyp    Exercise-induced asthma    Gastric erythema    History of hypertension no medications since  2020    Lipoma    of neck and chest   Multiple thyroid  nodules    OCD (obsessive compulsive disorder)    Pre-diabetes    Reflux esophagitis    Seasonal allergies 2015  Patient Active Problem List   Diagnosis Date Noted   Lipoma of neck 02/28/2024   Lipoma of left forearm 02/28/2024   Osteoarthritis of spine with radiculopathy, lumbar region 01/02/2024   Confusion with non-focal neuro exam 12/23/2023   Mass of frontal lobe 12/20/2023   Low back pain 12/06/2023   Pain in right hip 12/06/2023   Hair loss 01/25/2023   Cough with congestion of paranasal sinus 01/25/2023   Urinary symptom or sign 05/03/2021   Gastric intestinal metaplasia    Hepatic steatosis 10/08/2020   Dysautonomia (HCC) 10/08/2020   Gastric erythema    Goiter 08/25/2020   Autoimmune disease 05/27/2019   Menopause 10/04/2018   Esophageal dysphagia    Multiple thyroid  nodules 07/15/2017   History of radiation exposure 07/15/2017   IFG (impaired fasting glucose) 05/04/2017   Ascending aortic aneurysm 05/04/2017   Pineal gland cyst 09/28/2016   Family Neurologic/Medical Hx: Possible cognitive decline in mother (68s). Maternal grandmother (75). Possible Hx AD in maternal cousin.   Family History  Problem Relation Age of Onset   Diabetes Mother    Arthritis Mother    Obesity Mother    Miscarriages / Stillbirths Mother    Diabetes Father    Heart disease Father    Heart attack Father    Stomach cancer Father 91   Cancer Father    Asthma Brother    Anxiety disorder Brother    ADD / ADHD Brother    Osteoporosis Maternal Grandmother    Arthritis Maternal Grandmother    Varicose Veins Maternal Grandmother    Heart disease Paternal Grandmother    Stomach cancer Paternal Grandmother    Breast cancer Paternal Grandmother    Cancer Paternal Grandmother    Cancer Paternal Grandfather        spleen   Heart disease Paternal Grandfather    Alcohol abuse Maternal Uncle    Cancer Paternal Aunt        rare  blood cancer; non genetic   Diabetes Paternal Aunt    Cancer Paternal Uncle        rare blood cancer; non genetic   Cancer Paternal Aunt    Alcohol abuse Maternal Uncle    Medications: Per records.  amoxicillin -clavulanate (AUGMENTIN ) 875-125 MG tablet estradiol  (ESTRACE ) 0.1 MG/GM vaginal cream fluconazole  (DIFLUCAN ) 150 MG tablet metFORMIN  (GLUCOPHAGE -XR) 500 MG 24 hr tablet    Mental Status/Behavioral Observations: The patient was seen on an outpatient basis in the [facility] PM&R office for the clinical interview unaccompanied.  Sensorium/Arousal: Alert. Hearing and vision intact for demands of testing.    Orientation: Intact.    Appearance: Appropriate dress and hygiene.   Behavior/Engagement:   Cooperative, attentive.  Speech/Language: Conversational speech was prosodic, fluent, and well-articualted.    Motor: Ambulated independently and without issue. WNL overall.    Social Comportment: Friendly, appropriate for setting.    Mood: Euthymic.   Affect: Congruent.    Thought Process/Content: No indications of psychosis.    Ability to Participate in Interview: Readily answered all questions posed during the clinical interview with adequate detail regarding personal history.    Insight: Intact.     SUMMARY / CLINICAL IMPRESSIONS The patient is a 58 year old right-handed woman referred for neuropsychological evaluation by her primary care provider, Dr. Vicci, due to concerns for cognitive decline. Per referring provider notes (12/12/23) the patient endorsed difficulties with forgetfulness, expressive and receptive speech, and other subjective cognitive changes relative to her baseline. Difficulties with fatigue have been present since  March 2025. Neurological exam was non-focal. Lab test for Alzheimer's related bio-markers (ATN) were negative. MRI of the head (with and without contrast) completed on 12/19/23 showed, per records, detected a suspected meningioma (right frontoparietal  convexity and high posterior right frontal lobe), no mass effect, no parenchymal edema; small chronic infarct (right cerebellar hemisphere), normal cerebral volume, no cortical encephalomalacia, no significant white matter disease, no indication of acute infarct. Per visit notes from neurosurgery on 12/28/23 (Dr. Dino),  indicated the lesions are benign and surgery was not indicated, and not suspected to be the cause of memory difficulties. Plan for monitoring via 1-year follow up imaging was established.   Upon interview, the patient expressed concerns for cognitive decline consistent with those noted by the referring provider. She expressed understanding for the reason of the referral, and was interested in proceeding with the evaluation to help determine the nature of her cognitive symptoms.   The patient reported onset of cognitive symptoms and worsening of premorbid cognitive difficulties approximately 3 years ago. Onset correlated with acute worsening of anxiety, both of which the patient indicated correlate with menopause related changes. An episode of acute AMS in early 2025 involved significant cognitive symptomology, possible related to UTI and situational factors. The event was significantly distressing and the patient described sensitivity to cognitive changes in interacting with others, and changed her behavior out of concern for how she may appear (cognitively) to others. Patient worries for possibility of an undiagnosed/unidentified neuropathology (I.e., Alzheimer's, etc.) are significant. The acute cognitive changes in early 2025 resolved after about 1-2 months, and the patient felt she returned to her most recent baseline. Notably, the patient reports variability in symptom severity, somewhat episodic in nature. The patient also reported a notable improvement in her cognition for a duration lasting a few weeks in which she felt like I did when I was 42, but this change reverted after surgery  a few weeks ago. The patient has a psychiatric history notable for history of trauma and currently struggles with mild symptoms of depression and more significant difficulties with anxiety. Follow-up interviewing is planned for more in-depth assessment for indications of ADHD given fairly compelling indications of life-long symptomatology consistent with that disorder. Cognitive evaluation is indicated given concerns for cognitive change relative to her baseline. Test results will inform differential diagnosis and guide treatment planning.    DISPOSITION / PLAN The patient has been set up for a formal neuropsychological assessment to objectively assess her cognitive functioning across domains to establish the patient's cognitive profile. This data, in conjunction with information obtained via clinical interview and medical record review, will help clarify likely etiology and guide treatment recommendations. Once data collection and interpretation have been completed, the findings / diagnosis and recommendations will be reviewed and discussed with the patient during a feedback appointment with the neuropsychologist. Based on the collaborative dialogue with the patient during the feedback, recommendations may be adjusted / tailored as needed. A formal report will be produced and provided to the patient and the referring provider.   Diagnosis: Cognitive changes Anxiety, unspecified    FULL REPORT TO FOLLOW   Evalene DOROTHA Riff, PsyD [facility] [address] [place], [place] [address] Main: [phone] Fax: [phone] [facility] License # [id number]  This report was generated using voice recognition software. While this document has been carefully reviewed, transcription errors may be present. I apologize in advance for any inconvenience. Please contact me if further clarification is needed.  "

## 2024-03-28 ENCOUNTER — Ambulatory Visit

## 2024-04-01 ENCOUNTER — Encounter

## 2024-04-01 DIAGNOSIS — R4189 Other symptoms and signs involving cognitive functions and awareness: Secondary | ICD-10-CM

## 2024-04-01 DIAGNOSIS — F419 Anxiety disorder, unspecified: Secondary | ICD-10-CM

## 2024-04-01 NOTE — Progress Notes (Signed)
 "  Mental Status/Behavioral Observations (04/01/2025):  Orientation: The patient was oriented to self, place, and time. Sensory/Arousal: Hearing and vision were adequate for the demands of testing. The patient was alert. Appearance: Dress and hygiene were appropriate for the setting.  Speech/Language: In conversation, the patient's speech was prosodic, fluent, and well-articulated. The patient displayed no indications of word finding difficulties and no word substitution errors were observed.  Motor: The patient ambulated independently and without issue. No tremors were observed.  Social Comportment: Social behavior was appropriate for the setting. Mood/Affect: Mood was largely neutral to positive. Affect was congruent with mood.  Attention/Concentration:  The patient appeared to maintain consistent engagement throughout the testing session. No frank attentional lapses were observed.  Thought Process/Content: The patient's thought process was coherent, linear, goal directed. There were no indications of psychosis.  Additional Observations: The patient showed no difficulties with understanding task instructions. No difficulties with frustration tolerance were noted.  Neuropsychology Note Cotina Freedman Faro completed 190 minutes of neuropsychological testing with technician, Josue Ned, BA, under the supervision of Evalene Riff, PsyD., Clinical Neuropsychologist. The patient did not appear overtly distressed by the testing session, per behavioral observation or via self-report to the technician. Rest breaks were offered.   Clinical Decision Making: In considering the patient's current level of functioning, level of presumed impairment, nature of symptoms, emotional and behavioral responses during clinical interview, level of literacy, and observed level of motivation/effort, a battery of tests was selected by Dr. Riff during initial consultation on 03/25/2024. This was communicated to the technician.  Communication between the neuropsychologist and technician was ongoing throughout the testing session and changes were made as deemed necessary based on patient performance on testing, technician observations and additional pertinent factors such as those listed above.  Tests Administered: Automatic Data Edition (BNT-2) Brief Visuospatial Memory Test-Revised (BVMT-R) Benton Judgment of Line Orientation (JOLO) [place]  Verbal Learning Test-Third Edition (CVLT-3) Conners Continuous Performance Test 3rd Edition (CPT-3) Controlled Oral Word Association Test (FAS & Animals) Delis-Kaplan Executive Function System (D-KEFS), select subtests Rey Complex Figure Test (RCFT), select subtest Trail Making Test (TMT; Part A & B) Wechsler Adult Intelligence Scale-Fourth Edition (WAIS-IV), select subtests Wechsler Abbreviated Scale of Intelligence CIVIL SERVICE FAST STREAMER) Wechsler Memory Scale-Fourth Edition (WMS-IV) , select subtests Wechsler Memory Scale-Third Edition (WMS-III), select subtests  The Beck Depression Inventory-II (BDI-II) Beck Anxiety Inventory (BAI)  Results: Note: This summary of test scores accompanies the interpretive report and should not be interpreted by unqualified individuals or in isolation without reference to the report. Test scores are relative to age, gender, and educational history as available and appropriate. Measurement properties of test scores: IQ, Index, and Standard Scores (SS): Mean = 100; Standard Deviation = 15; Scaled Scores (ss): Mean = 10; Standard Deviation = 3; Z scores (Z): Mean = 0; Standard Deviation = 1; T scores (T); Mean = 50; Standard Deviation = 10  Intellectual/Premorbid Functioning Estimate   Norm Score Percentile  Range  WASI-II FSIQ-2  SS = 116 86 %ile High Average   Vocabulary   t = 53 61 %ile Average   Matrix Reasoning   t = 65 94 %ile Above Average   ATTENTION AND WORKING MEMORY    Norm Score Percentile  Range  WAIS-IV          Digit Span  ss =  13 84 %ile High Average   DSF  ss = 14 91 %ile Above Average   Span:    8      DSB  ss = 13 84 %ile High Average   Span:    6      DSS  ss = 11 63 %ile Average   Span:    6     WMS-III          Spatial Span  ss = 13 84 %ile High Average   SSF  ss = 16 98 %ile Exceptionally High   Span:    8      SSB  ss = 9 37 %ile Average   Span:    4      COMPLEX ATTENTION    Norm Score Percentile  Descriptor  CPT-3          Response Style          Detectability (reverse scored)  t = 58 80 %ile    Omission Errors  t = 49 73 %ile    Commission Errors  t = 56 75 %ile    Perseverations  t = 73 96 %ile    HRT (reaction time)  t = 56 78 %ile    HRT SD (reaction time consistency) t = 71 98 %ile    Variability (in RT consistency)  t = 53 78 %ile    HRT Block Change (over test)  t = 50 49 %ile    HRT ISI Change (by stimuli interval) t = 89 99 %ile    PROCESSING SPEED    Norm Score Percentile  Range  WAIS-IV          Coding  ss = 14 91 %ile Above Average   Symbol Search  ss = 12 75 %ile High Average   LANGUAGE    Norm Score Percentile Range  [place] Naming Test (BNT-2)  t = 40 16 %ile Low Average  COWAT          FAS  t = 45 30 %ile Average   Animals  t = 37 9 %ile Low Average   EXECUTIVE FUNCTIONING    Norm Score Percentile  Range  DKEFS - Color-Word Interference          Color Naming  ss = 7 16 %ile Low Average   Word Reading  ss = 13 84 %ile High Average   Inhibition  ss = 6 9 %ile Low Average   Errors  ss = 12 75 %ile High Average   Inhibition Switching  ss = 10 50 %ile Average   Errors  ss = 11 63 %ile Average  Trails A  t = 46 32 %ile Average  Trails B  t = 46 32 %ile Average   MEMORY    Norm Score Percentile  Range  BVMT-R          Trial 1  t = 57.0 75 %ile High Average   Trial 2  t = 64 92 %ile Above Average   Trial 3  t = 54.0 66 %ile Average   Total Recall  t = 59.0 81 %ile High Average   Learning  t = 52.0 58 %ile Average   Delayed Recall  t = 56.0 73 %ile Average   %  Retained    91 >16 %ile WNL   Hits     >16 %ile WNL   False Alarms     >16 %ile WNL   Recognition Discriminability     >16 %ile WNL  CVLT-III          Trial 1  ss = 10.0 50 %ile  Average   Trial 2  ss = 9.0 37 %ile Average   Trial 3  ss = 11.0 63 %ile Average   Trial 4  ss = 12.0 75 %ile High Average   Trial 5  ss = 10.0 50 %ile Average   Trial B  ss = 13.0 84 %ile High Average   Short Delay Free Recall  ss = 6.0 9 %ile Low Average   Short Delay Cued Recall  ss = 8.0 25 %ile Average   Long Delay Free Recall  ss = 10.0 50 %ile Average   Long Delay Cued Recall  ss = 9.0 37 %ile Average   Total Hits  ss = 13.0 84 %ile High Average   Total False Positives  ss = 11.0 63 %ile Average   Recognition Discriminability  ss = 13.0 84 %ile High Average   Total Intrusions  ss = 9.0 37 %ile Average   Trials 1-5 Total Correct  SS = 102 55 %ile Average   Total Repetitions  ss = 11.0 63 %ile Average   List B vs. Trial 1  ss = 13.0 84 %ile High Average   SD (FR) vs. Trial 5 Correct  ss = 4.0 2 %ile Below Average   LD (FR)vs. SD (FR)  ss = 15.0 95 %ile Above Average  Wechsler Memory Scale, 4th Edition (WMS-4)         Log. Mem. Immediate Recall  ss = 10 50 %ile Average   Logical Memory Delayed Recall  ss = 8 25 %ile Average   Logical Recognition    >75th   %ile High Average   VISUAL-SPATIAL    Norm Score Percentile  Range  WASI-II          Matrix Reasoning  t = 65 94 %ile Above Average  Benton JOLO  ss = 12 75 %ile High Average  Rey Complex Figure Copy       >16 %ile WNL   PERSONALITY AND BEHAVIORAL FUNCTIONING      Score/Interpretation  BDI Raw       18  BDI Severity       Mild.  BAI Raw       26  BAI Severity       Severe.    Feedback to Patient: Brittany Ochoa will return on 04/25/2024 for an interactive feedback session with Dr. Hayden at which time her test performances, clinical impressions and treatment recommendations will be reviewed in detail. The patient understands she can contact our  office should she require our assistance before this time.  190 minutes spent face-to-face with patient administering standardized tests, 50 minutes spent scoring radiographer, therapeutic). [CPT A8018220, 96139]  Full report to follow. "

## 2024-04-04 ENCOUNTER — Ambulatory Visit: Admitting: Family Medicine

## 2024-04-07 ENCOUNTER — Telehealth: Admitting: Family Medicine

## 2024-04-07 ENCOUNTER — Encounter: Admitting: Psychology

## 2024-04-07 DIAGNOSIS — J019 Acute sinusitis, unspecified: Secondary | ICD-10-CM | POA: Diagnosis not present

## 2024-04-07 DIAGNOSIS — B9689 Other specified bacterial agents as the cause of diseases classified elsewhere: Secondary | ICD-10-CM | POA: Diagnosis not present

## 2024-04-07 DIAGNOSIS — F902 Attention-deficit hyperactivity disorder, combined type: Secondary | ICD-10-CM

## 2024-04-07 DIAGNOSIS — R419 Unspecified symptoms and signs involving cognitive functions and awareness: Secondary | ICD-10-CM | POA: Diagnosis not present

## 2024-04-07 DIAGNOSIS — F419 Anxiety disorder, unspecified: Secondary | ICD-10-CM | POA: Diagnosis not present

## 2024-04-07 DIAGNOSIS — R4189 Other symptoms and signs involving cognitive functions and awareness: Secondary | ICD-10-CM | POA: Diagnosis not present

## 2024-04-07 MED ORDER — AMOXICILLIN-POT CLAVULANATE 875-125 MG PO TABS: 1.0000 | ORAL_TABLET | Freq: Two times a day (BID) | ORAL | 0 refills | Status: AC

## 2024-04-07 NOTE — Progress Notes (Signed)

## 2024-04-16 ENCOUNTER — Telehealth: Admitting: Physician Assistant

## 2024-04-16 DIAGNOSIS — J329 Chronic sinusitis, unspecified: Secondary | ICD-10-CM

## 2024-04-17 NOTE — Progress Notes (Signed)
" ° °  Thank you for the details you included in the comment boxes. Those details are very helpful in determining the best course of treatment for you and help us  to provide the best care.Because of recurring symptoms after treatment, we recommend that you schedule a Virtual Urgent Care video visit in order for the provider to better assess what is going on.  The provider will be able to give you a more accurate diagnosis and treatment plan if we can more freely discuss your symptoms and with the addition of a virtual examination.   If you change your visit to a video visit, we will bill your insurance (similar to an office visit) and you will not be charged for this e-Visit. You will be able to stay at home and speak with the first available [facility] advanced practice provider. The link to do a video visit is in the drop down Menu tab of your Welcome screen in MyChart.    "

## 2024-04-25 ENCOUNTER — Encounter: Payer: Self-pay | Admitting: Psychology

## 2024-04-25 ENCOUNTER — Encounter: Attending: Psychology | Admitting: Psychology

## 2024-04-25 DIAGNOSIS — F902 Attention-deficit hyperactivity disorder, combined type: Secondary | ICD-10-CM | POA: Insufficient documentation

## 2024-04-25 DIAGNOSIS — R419 Unspecified symptoms and signs involving cognitive functions and awareness: Secondary | ICD-10-CM | POA: Insufficient documentation

## 2024-04-25 DIAGNOSIS — F419 Anxiety disorder, unspecified: Secondary | ICD-10-CM | POA: Insufficient documentation

## 2024-05-06 ENCOUNTER — Other Ambulatory Visit: Payer: Self-pay

## 2024-05-06 ENCOUNTER — Ambulatory Visit: Admitting: Family Medicine

## 2024-05-06 VITALS — BP 146/90 | HR 66 | Temp 97.9°F | Ht 64.02 in | Wt 188.0 lb

## 2024-05-06 DIAGNOSIS — B3731 Acute candidiasis of vulva and vagina: Secondary | ICD-10-CM | POA: Diagnosis not present

## 2024-05-06 DIAGNOSIS — R300 Dysuria: Secondary | ICD-10-CM

## 2024-05-06 LAB — URINALYSIS, ROUTINE W REFLEX MICROSCOPIC
Bilirubin, UA: NEGATIVE
Glucose, UA: NEGATIVE
Ketones, UA: NEGATIVE
Leukocytes,UA: NEGATIVE
Nitrite, UA: NEGATIVE
Protein,UA: NEGATIVE
RBC, UA: NEGATIVE
Specific Gravity, UA: 1.015 (ref 1.005–1.030)
Urobilinogen, Ur: 0.2 mg/dL (ref 0.2–1.0)
pH, UA: 7 (ref 5.0–7.5)

## 2024-05-06 LAB — WET PREP FOR TRICH, YEAST, CLUE
Clue Cell Exam: NEGATIVE
Trichomonas Exam: NEGATIVE
Yeast Exam: POSITIVE — AB

## 2024-05-06 MED ORDER — FLUCONAZOLE 150 MG PO TABS
150.0000 mg | ORAL_TABLET | Freq: Every day | ORAL | 0 refills | Status: AC
  Filled 2024-05-06: qty 2, 2d supply, fill #0

## 2024-05-06 NOTE — Progress Notes (Signed)
 "  BP (!) 146/90   Pulse 66   Temp 97.9 F (36.6 C) (Oral)   Ht 5' 4.02 (1.626 m)   Wt 188 lb (85.3 kg)   LMP 06/01/2019   SpO2 97%   BMI 32.25 kg/m    Subjective:    Patient ID: Brittany Ochoa, female    DOB: 1965-06-18, 59 y.o.   MRN: [id number]  HPI: Brittany Ochoa is a 59 y.o. female  Chief Complaint  Patient presents with   Urinary Tract Infection    Vaginal odor. Started 2 weeks ago    Urinary Frequency   Nausea   URINARY SYMPTOMS Duration: 2 weeks Dysuria: no Urinary frequency: yes Urgency: yes Small volume voids: no Symptom severity: moderate Urinary incontinence: no Foul odor: yes Hematuria: no Abdominal pain: no Back pain: no Suprapubic pain/pressure: no Flank pain: no Fever:  no Vomiting: no Relief with cranberry juice: no Relief with pyridium : no Status: worse Previous urinary tract infection: no Recurrent urinary tract infection: no History of sexually transmitted disease: no Vaginal discharge: no Treatments attempted: increasing fluids   Relevant past medical, surgical, family and social history reviewed and updated as indicated. Interim medical history since our last visit reviewed. Allergies and medications reviewed and updated.  Review of Systems  Constitutional: Negative.   Respiratory: Negative.    Cardiovascular: Negative.   Genitourinary:  Positive for frequency and urgency. Negative for decreased urine volume, difficulty urinating, dyspareunia, dysuria, enuresis, flank pain, genital sores, hematuria, menstrual problem, pelvic pain, vaginal bleeding, vaginal discharge and vaginal pain.  Psychiatric/Behavioral: Negative.      Per HPI unless specifically indicated above     Objective:    BP (!) 146/90   Pulse 66   Temp 97.9 F (36.6 C) (Oral)   Ht 5' 4.02 (1.626 m)   Wt 188 lb (85.3 kg)   LMP 06/01/2019   SpO2 97%   BMI 32.25 kg/m   Wt Readings from Last 3 Encounters:  05/06/24 188 lb (85.3 kg)  03/19/24 173 lb (78.5  kg)  03/11/24 173 lb 3.2 oz (78.6 kg)    Physical Exam Vitals and nursing note reviewed.  Constitutional:      General: She is not in acute distress.    Appearance: Normal appearance. She is not ill-appearing, toxic-appearing or diaphoretic.  HENT:     Head: Normocephalic and atraumatic.     Right Ear: External ear normal.     Left Ear: External ear normal.     Nose: Nose normal.     Mouth/Throat:     Mouth: Mucous membranes are moist.     Pharynx: Oropharynx is clear.  Eyes:     General: No scleral icterus.       Right eye: No discharge.        Left eye: No discharge.     Extraocular Movements: Extraocular movements intact.     Conjunctiva/sclera: Conjunctivae normal.     Pupils: Pupils are equal, round, and reactive to light.  Cardiovascular:     Rate and Rhythm: Normal rate and regular rhythm.     Pulses: Normal pulses.     Heart sounds: Normal heart sounds. No murmur heard.    No friction rub. No gallop.  Pulmonary:     Effort: Pulmonary effort is normal. No respiratory distress.     Breath sounds: Normal breath sounds. No stridor. No wheezing, rhonchi or rales.  Chest:     Chest wall: No tenderness.  Musculoskeletal:  General: Normal range of motion.     Cervical back: Normal range of motion and neck supple.  Skin:    General: Skin is warm and dry.     Capillary Refill: Capillary refill takes less than 2 seconds.     Coloration: Skin is not jaundiced or pale.     Findings: No bruising, erythema, lesion or rash.  Neurological:     General: No focal deficit present.     Mental Status: She is alert and oriented to person, place, and time. Mental status is at baseline.  Psychiatric:        Mood and Affect: Mood normal.        Behavior: Behavior normal.        Thought Content: Thought content normal.        Judgment: Judgment normal.     Results for orders placed or performed during the hospital encounter of 02/28/24  Surgical pathology   Collection Time:  02/28/24 12:00 AM  Result Value Ref Range   SURGICAL PATHOLOGY      SURGICAL PATHOLOGY [facility] [address] [place], [place] [address] Telephone [phone] or [phone] Fax [phone]  REPORT OF SURGICAL PATHOLOGY   Accession #: [id number] Patient Name: Brittany Ochoa, Brittany Ochoa Visit # : [id number]  MRN: [id number] Physician: Jordis Cliche DOB/Age [birth year] (Age: 64) Gender: F Collected Date: 02/28/2024 Received Date: 02/28/2024  FINAL DIAGNOSIS       1. Soft tissue, lipoma, left neck mass :       BENIGN ADIPOSE TISSUE CONSISTENT WITH LIPOMA.       2. Soft tissue, lipoma, left forearm mass :       BENIGN ADIPOSE TISSUE CONSISTENT WITH LIPOMA.       ELECTRONIC SIGNATURE : Belvie Come, John, Pathologist, Electronic Signature  MICROSCOPIC DESCRIPTION  CASE COMMENTS STAINS USED IN DIAGNOSIS: H&E H&E H&E H&E H&E H&E    CLINICAL HISTORY  SPECIMEN(S) OBTAINED 1. Soft tissue, lipoma, Left Neck Mass 2. Soft tissue, lipoma, Left Forearm Mass  SPECIMEN COMMENTS: SPECIMEN CLINIC AL INFORMATION: 1. Soft tissue masses, left neck and left forearm    Gross Description 1. Received fresh and placed in formalin is a 7.0 x 6.6 x 2.0 cm fragment of adipose tissue.The exterior is inked blue, the cut surface has foci of hemorrhage, <10%.Representative sections in 3 blocks. 2. Received fresh and placed in formalin is a 4.2 x 3.5 x 0.9 cm fragment of adipose tissue.The exterior is inked blue, the cut surface has minimal foci of hemorrhage.Representative sections in 2 blocks.mb 02-29-24        Report signed out from the following location(s) [place] [address] [place], [place] [address] CLIA #: [id number]  [facility] [address] [place], [place] [address] CLIA #: [id number]   Glucose, capillary   Collection Time: 02/28/24  6:37 AM  Result Value Ref Range   Glucose-Capillary 103  (H) 70 - 99 mg/dL  Glucose, capillary   Collection Time: 02/28/24 10:18 AM  Result Value Ref Range   Glucose-Capillary 134 (H) 70 - 99 mg/dL   Comment 1 Notify RN    Comment 2 Document in Chart       Assessment & Plan:   Problem List Items Addressed This Visit   None Visit Diagnoses       Yeast vaginitis    -  Primary   Will treat with diflucan . Call with any concerns. Await culture. Continue  to monitor.   Relevant Medications   fluconazole  (DIFLUCAN ) 150 MG tablet     Dysuria       + yeast   Relevant Orders   Urinalysis, Routine w reflex microscopic   Urine Culture   WET PREP FOR TRICH, YEAST, CLUE        Follow up plan: Return in about 4 weeks (around 06/03/2024).      "

## 2024-05-08 ENCOUNTER — Ambulatory Visit: Admitting: Family Medicine

## 2024-05-08 ENCOUNTER — Other Ambulatory Visit: Payer: Self-pay

## 2024-05-08 ENCOUNTER — Encounter: Payer: Self-pay | Admitting: Family Medicine

## 2024-05-08 VITALS — BP 138/90 | HR 90 | Temp 98.0°F | Resp 17 | Ht 64.02 in | Wt 187.2 lb

## 2024-05-08 DIAGNOSIS — W5501XA Bitten by cat, initial encounter: Secondary | ICD-10-CM

## 2024-05-08 DIAGNOSIS — Z887 Allergy status to serum and vaccine status: Secondary | ICD-10-CM

## 2024-05-08 DIAGNOSIS — S51852A Open bite of left forearm, initial encounter: Secondary | ICD-10-CM

## 2024-05-08 DIAGNOSIS — L03114 Cellulitis of left upper limb: Secondary | ICD-10-CM | POA: Diagnosis not present

## 2024-05-08 LAB — URINE CULTURE

## 2024-05-08 MED ORDER — AMOXICILLIN-POT CLAVULANATE 875-125 MG PO TABS
1.0000 | ORAL_TABLET | Freq: Two times a day (BID) | ORAL | 0 refills | Status: AC
  Filled 2024-05-08: qty 20, 10d supply, fill #0

## 2024-05-08 NOTE — Assessment & Plan Note (Signed)
 Had a bad reaction to flu vaccine several years ago. Has held off on all vaccines since then. Will get her into see allergy/immunology. Discussed that she is due for tetanus shot very soon- she would like to hold off on getting shot now. Will see what allergy recommended on further vaccinations- warning signs to go to the ER discussed today.

## 2024-05-08 NOTE — Progress Notes (Signed)
 "  BP (!) 138/90 (BP Location: Left Arm, Patient Position: Sitting, Cuff Size: Normal)   Pulse 90   Temp 98 F (36.7 C) (Oral)   Resp 17   Ht 5' 4.02 (1.626 m)   Wt 187 lb 3.2 oz (84.9 kg)   LMP 06/01/2019   BMI 32.12 kg/m    Subjective:    Patient ID: Brittany Ochoa, female    DOB: 09-29-1965, 59 y.o.   MRN: [id number]  HPI: Brittany Ochoa is a 59 y.o. female  Chief Complaint  Patient presents with   Animal Bite    Got bite by her cat, cat is update to with shots, fangs were into her skin. Bite has doubled in size.   SKIN INFECTION- got a cat bite from her own cat yesterday- she is up to date on his shots. She is very anxious about getting vaccines due to history of dysautonomia and vaccine reaction to the flu shot in the past Duration: 1 day Location: L forearm History of trauma in area: yes Pain: yes Quality: aching and tight Severity: moderate Redness: yes Swelling: yes Oozing: no Pus: no Fevers: no Nausea/vomiting: no Status: worse Treatments attempted:warm compresses  Tetanus: due very soon- declined  Relevant past medical, surgical, family and social history reviewed and updated as indicated. Interim medical history since our last visit reviewed. Allergies and medications reviewed and updated.  Review of Systems  Constitutional: Negative.   Respiratory: Negative.    Cardiovascular: Negative.   Musculoskeletal: Negative.   Psychiatric/Behavioral: Negative.      Per HPI unless specifically indicated above     Objective:    BP (!) 138/90 (BP Location: Left Arm, Patient Position: Sitting, Cuff Size: Normal)   Pulse 90   Temp 98 F (36.7 C) (Oral)   Resp 17   Ht 5' 4.02 (1.626 m)   Wt 187 lb 3.2 oz (84.9 kg)   LMP 06/01/2019   BMI 32.12 kg/m   Wt Readings from Last 3 Encounters:  05/08/24 187 lb 3.2 oz (84.9 kg)  05/06/24 188 lb (85.3 kg)  03/19/24 173 lb (78.5 kg)    Physical Exam Vitals and nursing note reviewed.  Constitutional:       General: She is not in acute distress.    Appearance: Normal appearance. She is not ill-appearing, toxic-appearing or diaphoretic.  HENT:     Head: Normocephalic and atraumatic.     Right Ear: External ear normal.     Left Ear: External ear normal.     Nose: Nose normal.     Mouth/Throat:     Mouth: Mucous membranes are moist.     Pharynx: Oropharynx is clear.  Eyes:     General: No scleral icterus.       Right eye: No discharge.        Left eye: No discharge.     Extraocular Movements: Extraocular movements intact.     Conjunctiva/sclera: Conjunctivae normal.     Pupils: Pupils are equal, round, and reactive to light.  Cardiovascular:     Rate and Rhythm: Normal rate and regular rhythm.     Pulses: Normal pulses.     Heart sounds: Normal heart sounds. No murmur heard.    No friction rub. No gallop.  Pulmonary:     Effort: Pulmonary effort is normal. No respiratory distress.     Breath sounds: Normal breath sounds. No stridor. No wheezing, rhonchi or rales.  Chest:     Chest wall: No tenderness.  Musculoskeletal:        General: Normal range of motion.     Cervical back: Normal range of motion and neck supple.  Skin:    General: Skin is warm and dry.     Capillary Refill: Capillary refill takes less than 2 seconds.     Coloration: Skin is not jaundiced or pale.     Findings: Erythema (+ heat and swelling in distal forearm) present. No bruising, lesion or rash.  Neurological:     General: No focal deficit present.     Mental Status: She is alert and oriented to person, place, and time. Mental status is at baseline.  Psychiatric:        Mood and Affect: Mood normal.        Behavior: Behavior normal.        Thought Content: Thought content normal.        Judgment: Judgment normal.     Results for orders placed or performed in visit on 05/06/24  Urine Culture   Collection Time: 05/06/24  2:25 PM   Specimen: Urine   UR  Result Value Ref Range   Urine Culture, Routine  Final report    Organism ID, Bacteria Comment   WET PREP FOR TRICH, YEAST, CLUE   Collection Time: 05/06/24  2:25 PM   Specimen: Urine   Urine  Result Value Ref Range   Trichomonas Exam Negative Negative   Yeast Exam Positive (A) Negative   Clue Cell Exam Negative Negative  Urinalysis, Routine w reflex microscopic   Collection Time: 05/06/24  2:25 PM  Result Value Ref Range   Specific Gravity, UA 1.015 1.005 - 1.030   pH, UA 7.0 5.0 - 7.5   Color, UA Yellow Yellow   Appearance Ur Clear Clear   Leukocytes,UA Negative Negative   Protein,UA Negative Negative/Trace   Glucose, UA Negative Negative   Ketones, UA Negative Negative   RBC, UA Negative Negative   Bilirubin, UA Negative Negative   Urobilinogen, Ur 0.2 0.2 - 1.0 mg/dL   Nitrite, UA Negative Negative   Microscopic Examination Comment       Assessment & Plan:   Problem List Items Addressed This Visit       Other   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 Has held off on all vaccines since then. Will get her into see allergy/immunology. Discussed that she is due for tetanus shot very soon- she would like to hold off on getting shot now. Will see what allergy recommended on further vaccinations- warning signs to go to the ER discussed today.       Relevant Orders   Ambulatory referral to Allergy   Other Visit Diagnoses       Cellulitis of left arm    -  Primary   Will treat with augmentin . Will monitor closely and call if not improving or getting worse. Continue to monitor.     Cat bite of left forearm, initial encounter       Will treat with augmentin . Will monitor closely and call if not improving or getting worse. Continue to monitor.        Follow up plan: Return if symptoms worsen or fail to improve.      "

## 2024-05-13 ENCOUNTER — Encounter: Payer: Self-pay | Admitting: Family Medicine

## 2024-05-13 NOTE — Progress Notes (Signed)
 ADHD Symptomatology:  -Endorsements were based on patient self report during interview. Diagnostic clinical interview utilized questions and examples from DIVA-5, and overall structure which was combined with broader diagnostic clinical interviewing for symptom validation and differential.   -Symptoms indicated as currently present (adult): Present at least 6 months but also appear chronic/trait-like. Symptoms negatively impact functioning. Symptoms are not better explained by another mental disorder. -Symptoms indicated as present during childhood: Present by 59 years of age, are inconsistent with developmental level, negatively impact functioning, and are not better explained by another mental disorder.  Attention-Deficit Symptoms: Often difficulties with.. Current Childhood  A1a Attention to detail/careless mistakes:  *** ***  A1b Sustained attention:  *** ***  A1c Attending when addressed directly:  *** ***  A1d Follow through on instructions / tasks:   *** ***  A1e Organizational difficulty:  *** ***  A39f Aversion / avoidance of sustained mental effort:  *** ***  A1g Losing things necessary for tasks/activities:  *** ***  A1h Easily distracted by extraneous stimuli:  *** ***  A1i Forgetful in daily activities:  *** ***      Hyperactivity/Impulsivity Symptoms: Often difficulties with    A2a Fidgeting/squirming in seat: *** ***  A2b Remaining seated / needing to move around: *** ***  A2c Restlessness/runs or climbs when inappropriate:  *** ***  A2d Engaging in leisure activities quietly: ** *** ***  A2e Excessive energy:  *** ***  A40f Talking excessively:  *** ***  A2g Blurting out answers / interrupting others:  *** ***  A2h Waiting turn:  *** ***  A2i Interruption or socially intrusive: *** ***   (Getting in trouble for talking in class,

## 2024-05-14 NOTE — Progress Notes (Signed)
" ° °  NEUROPSYCHOLOGICAL EVALUATION [place]  Physical Medicine and Rehabilitation     Patient: Brittany Ochoa  MRN: [id number] DOB: [birth year]   Provider/Neuropsychologist: Evalene DOROTHA Riff, PsyD Date of Service: 04/25/24 Start Time: 2 PM End Time: 3 PM Billing Code: [id number]  Location of Service:  [facility] Physical Medicine & Rehabilitation Department [address] [place], [place] [address] Individuals present/Service: Patient and provider met in-person in the provider's office for the 60 minute interactive feedback appointment.   The complete/final neuropsychological evaluation report is posted within the patient's electronic medical chart with encounter date 04/07/25, but protected to ensure confidentiality at patient request. A copy of the report is sent to the referring provider with patient's consent.   During the feedback visit, the provider reviewed and discussed the results of neuropsychological evaluation. Follow-up interviewing was conducted as needed. Review of results included overall findings, test scores, diagnosis, and treatment planning and recommendations. The patient was provided opportunity to ask questions. The patient expressed understanding of the information reviewed. copy of the full report will also be mailed to the patient. The patient was encouraged to reach out should any additional questions emerge upon receipt of a copy of the report which will be sent to the patient by mail.    Evalene DOROTHA Riff, PsyD [facility] [address] [place], [place] [address] Office Main: [phone] Direct: [phone] Fax: [phone] [place] License # [id number]  This report was generated using voice recognition software. While this document has been carefully reviewed, transcription errors may be present. I apologize in advance for any inconvenience. Please contact me if further clarification is needed.  "

## 2024-06-02 ENCOUNTER — Ambulatory Visit: Admitting: Family Medicine

## 2024-12-05 ENCOUNTER — Encounter: Admitting: Family Medicine

## 2024-12-19 ENCOUNTER — Ambulatory Visit: Admitting: Neurosurgery
